# Patient Record
Sex: Male | Born: 1950
Health system: Southern US, Community
[De-identification: ages and names within clinical notes are randomized; demographics above are authoritative.]

## PROBLEM LIST (undated history)

## (undated) DIAGNOSIS — R600 Localized edema: Secondary | ICD-10-CM

## (undated) DIAGNOSIS — D649 Anemia, unspecified: Secondary | ICD-10-CM

## (undated) DIAGNOSIS — I10 Essential (primary) hypertension: Secondary | ICD-10-CM

## (undated) DIAGNOSIS — L309 Dermatitis, unspecified: Secondary | ICD-10-CM

## (undated) HISTORY — PX: COLONOSCOPY: SHX174

---

## 2004-10-31 ENCOUNTER — Emergency Department (HOSPITAL_COMMUNITY): Admission: EM | Admit: 2004-10-31 | Discharge: 2004-10-31 | Payer: Self-pay | Admitting: Family Medicine

## 2006-11-01 ENCOUNTER — Emergency Department (HOSPITAL_COMMUNITY): Admission: EM | Admit: 2006-11-01 | Discharge: 2006-11-02 | Payer: Self-pay | Admitting: Emergency Medicine

## 2010-02-24 ENCOUNTER — Emergency Department (HOSPITAL_COMMUNITY): Admission: EM | Admit: 2010-02-24 | Discharge: 2010-02-24 | Payer: Self-pay | Admitting: Emergency Medicine

## 2011-07-14 HISTORY — PX: FINGER SURGERY: SHX640

## 2012-05-18 ENCOUNTER — Emergency Department (HOSPITAL_COMMUNITY)
Admission: EM | Admit: 2012-05-18 | Discharge: 2012-05-19 | Disposition: A | Discharge status: Home / Self Care | Payer: Self-pay | Attending: Emergency Medicine | Admitting: Emergency Medicine

## 2012-05-18 ENCOUNTER — Encounter (HOSPITAL_COMMUNITY): Payer: Self-pay | Admitting: Adult Health

## 2012-05-18 DIAGNOSIS — L589 Radiodermatitis, unspecified: Secondary | ICD-10-CM | POA: Insufficient documentation

## 2012-05-18 DIAGNOSIS — Z7982 Long term (current) use of aspirin: Secondary | ICD-10-CM | POA: Insufficient documentation

## 2012-05-18 DIAGNOSIS — L309 Dermatitis, unspecified: Secondary | ICD-10-CM

## 2012-05-18 NOTE — ED Notes (Signed)
C/o facial rash that described as burning and itching t hat has been ongoing for 8 months and is intermittent. Small bumps noted to face. Airway intact.
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
## 2012-05-19 MED ORDER — HYDROCORTISONE 1 % EX CREA
TOPICAL_CREAM | Freq: Once | CUTANEOUS | Status: AC
  Administered 2012-05-19: 01:00:00 via TOPICAL
  Filled 2012-05-19: qty 28

## 2012-05-19 NOTE — ED Provider Notes (Signed)
Medical screening examination/treatment/procedure(s) were performed by non-physician practitioner and as supervising physician I was immediately available for consultation/collaboration.  Donnetta Hutching, MD 05/19/12 [phone]

## 2012-05-19 NOTE — ED Provider Notes (Signed)
History     CSN: [id number]  Arrival date & time 05/18/12  2144   None     Chief Complaint  Patient presents with  . Rash    (Consider location/radiation/quality/duration/timing/severity/associated sxs/prior treatment) HPI History provided by pt.   Pt c/o intermittent, severely pruritic and burning, diffuse facial rash x 9 months.  Symptoms have been very frequent lately.  Has had some relief w/ vitamin E lotion but not an anti-fungal cream recommended by his barber.  No associated sx including fever, dry mucous membranes or rash anywhere else.  No know allergies or new contacts.   History reviewed. No pertinent past medical history.  History reviewed. No pertinent past surgical history.  History reviewed. No pertinent family history.  History  Substance Use Topics  . Smoking status: Never Smoker   . Smokeless tobacco: Not on file  . Alcohol Use: No      Review of Systems  All other systems reviewed and are negative.    Allergies  Review of patient's allergies indicates no known allergies.  Home Medications   Current Outpatient Rx  Name  Route  Sig  Dispense  Refill  . ASPIRIN EC 81 MG PO TBEC   Oral   Take 81 mg by mouth daily.           BP 137/93  Pulse 114  Temp 98 F (36.7 C) (Oral)  Resp 20  SpO2 97%  Physical Exam  Nursing note and vitals reviewed. Constitutional: He is oriented to person, place, and time. He appears well-developed and well-nourished. No distress.  HENT:  Head: Normocephalic and atraumatic.  Mouth/Throat: Oropharynx is clear and moist.  Eyes:       Normal appearance  Neck: Normal range of motion.  Pulmonary/Chest: Effort normal.  Musculoskeletal: Normal range of motion.  Neurological: He is alert and oriented to person, place, and time.  Skin:       Hyperpigmented plaques of entire face. Skin feels mildly leathery.  No obvious edema.  Non-tender.  Rest of skin w/ nml appearance  Psychiatric: He has a normal mood and  affect. His behavior is normal.    ED Course  Procedures (including critical care time)  Labs Reviewed - No data to display No results found.   1. Chronic dermatitis       MDM  Healthy 61yo M presents w/ intermittent pruritic/burning facial rash x 9 months.  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  Will treat w/ hydrocortisone cream 1% BID-TID and recommended avoidance of scratching (benadryl and cool compresses to relieve itching).  Referred to [facility] for persistent/worsening sx.  Return precautions discussed.        Otilio Miu, [place] 05/19/12 [phone]

## 2012-09-13 ENCOUNTER — Emergency Department (HOSPITAL_COMMUNITY)
Admission: EM | Admit: 2012-09-13 | Discharge: 2012-09-13 | Disposition: A | Discharge status: Home / Self Care | Payer: Worker's Compensation | Attending: Orthopedic Surgery | Admitting: Orthopedic Surgery

## 2012-09-13 ENCOUNTER — Encounter (HOSPITAL_COMMUNITY): Payer: Self-pay | Admitting: *Deleted

## 2012-09-13 ENCOUNTER — Encounter (HOSPITAL_COMMUNITY): Payer: Self-pay | Admitting: Anesthesiology

## 2012-09-13 ENCOUNTER — Emergency Department (HOSPITAL_COMMUNITY): Payer: Worker's Compensation

## 2012-09-13 ENCOUNTER — Emergency Department (HOSPITAL_COMMUNITY): Payer: Worker's Compensation | Admitting: Anesthesiology

## 2012-09-13 ENCOUNTER — Encounter (HOSPITAL_COMMUNITY): Admission: EM | Disposition: A | Discharge status: Home / Self Care | Payer: Self-pay | Source: Home / Self Care

## 2012-09-13 ENCOUNTER — Ambulatory Visit: Admit: 2012-09-13 | Payer: Self-pay | Admitting: Orthopedic Surgery

## 2012-09-13 DIAGNOSIS — S6980XA Other specified injuries of unspecified wrist, hand and finger(s), initial encounter: Secondary | ICD-10-CM | POA: Insufficient documentation

## 2012-09-13 DIAGNOSIS — S6721XA Crushing injury of right hand, initial encounter: Secondary | ICD-10-CM

## 2012-09-13 DIAGNOSIS — S6990XA Unspecified injury of unspecified wrist, hand and finger(s), initial encounter: Secondary | ICD-10-CM | POA: Insufficient documentation

## 2012-09-13 DIAGNOSIS — Y9269 Other specified industrial and construction area as the place of occurrence of the external cause: Secondary | ICD-10-CM | POA: Insufficient documentation

## 2012-09-13 DIAGNOSIS — W319XXA Contact with unspecified machinery, initial encounter: Secondary | ICD-10-CM | POA: Insufficient documentation

## 2012-09-13 DIAGNOSIS — Y990 Civilian activity done for income or pay: Secondary | ICD-10-CM | POA: Insufficient documentation

## 2012-09-13 DIAGNOSIS — S62639B Displaced fracture of distal phalanx of unspecified finger, initial encounter for open fracture: Secondary | ICD-10-CM | POA: Insufficient documentation

## 2012-09-13 HISTORY — PX: I & D EXTREMITY: SHX5045

## 2012-09-13 LAB — CBC WITH DIFFERENTIAL/PLATELET
Basophils Absolute: 0 10*3/uL (ref 0.0–0.1)
Basophils Relative: 1 % (ref 0–1)
Eosinophils Absolute: 0.3 10*3/uL (ref 0.0–0.7)
Eosinophils Relative: 5 % (ref 0–5)
HCT: 38.8 % — ABNORMAL LOW (ref 39.0–52.0)
Hemoglobin: 13.4 g/dL (ref 13.0–17.0)
Lymphocytes Relative: 27 % (ref 12–46)
Lymphs Abs: 1.7 10*3/uL (ref 0.7–4.0)
MCH: 30 pg (ref 26.0–34.0)
MCHC: 34.5 g/dL (ref 30.0–36.0)
MCV: 87 fL (ref 78.0–100.0)
Monocytes Absolute: 0.5 10*3/uL (ref 0.1–1.0)
Monocytes Relative: 8 % (ref 3–12)
Neutro Abs: 3.9 10*3/uL (ref 1.7–7.7)
Neutrophils Relative %: 61 % (ref 43–77)
Platelets: 255 10*3/uL (ref 150–400)
RBC: 4.46 MIL/uL (ref 4.22–5.81)
RDW: 12.9 % (ref 11.5–15.5)
WBC: 6.5 10*3/uL (ref 4.0–10.5)

## 2012-09-13 LAB — COMPREHENSIVE METABOLIC PANEL
ALT: 30 U/L (ref 0–53)
AST: 46 U/L — ABNORMAL HIGH (ref 0–37)
Albumin: 3.7 g/dL (ref 3.5–5.2)
Alkaline Phosphatase: 100 U/L (ref 39–117)
BUN: 12 mg/dL (ref 6–23)
CO2: 24 mEq/L (ref 19–32)
Calcium: 9.7 mg/dL (ref 8.4–10.5)
Chloride: 103 mEq/L (ref 96–112)
Creatinine, Ser: 0.9 mg/dL (ref 0.50–1.35)
GFR calc Af Amer: >90 mL/min (ref >90)
GFR calc non Af Amer: >90 mL/min (ref >90)
Glucose, Bld: 108 mg/dL — ABNORMAL HIGH (ref 70–99)
Potassium: 3.8 mEq/L (ref 3.5–5.1)
Sodium: 139 mEq/L (ref 135–145)
Total Bilirubin: 0.6 mg/dL (ref 0.3–1.2)
Total Protein: 8.1 g/dL (ref 6.0–8.3)

## 2012-09-13 SURGERY — IRRIGATION AND DEBRIDEMENT EXTREMITY
Anesthesia: General | Site: Hand | Laterality: Right | Wound class: Clean

## 2012-09-13 MED ORDER — ONDANSETRON HCL 4 MG/2ML IJ SOLN
INTRAMUSCULAR | Status: DC | PRN
  Administered 2012-09-13: 4 mg via INTRAVENOUS

## 2012-09-13 MED ORDER — GLYCOPYRROLATE 0.2 MG/ML IJ SOLN
INTRAMUSCULAR | Status: DC | PRN
  Administered 2012-09-13: 0.2 mg via INTRAVENOUS

## 2012-09-13 MED ORDER — ONDANSETRON HCL 4 MG/2ML IJ SOLN
4.0000 mg | Freq: Four times a day (QID) | INTRAMUSCULAR | Status: DC | PRN
  Administered 2012-09-13: 4 mg via INTRAVENOUS
  Filled 2012-09-13: qty 2

## 2012-09-13 MED ORDER — CEFAZOLIN SODIUM-DEXTROSE 2-3 GM-% IV SOLR
2.0000 g | Freq: Once | INTRAVENOUS | Status: AC
  Administered 2012-09-13: 2 g via INTRAVENOUS
  Filled 2012-09-13: qty 50

## 2012-09-13 MED ORDER — DEXAMETHASONE SODIUM PHOSPHATE 4 MG/ML IJ SOLN
INTRAMUSCULAR | Status: DC | PRN
  Administered 2012-09-13: 4 mg via INTRAVENOUS

## 2012-09-13 MED ORDER — SODIUM CHLORIDE 0.9 % IR SOLN
Status: DC | PRN
  Administered 2012-09-13: 2000 mL

## 2012-09-13 MED ORDER — OXYCODONE HCL 5 MG PO TABS: 5.0000 mg | ORAL_TABLET | ORAL | Status: DC | PRN

## 2012-09-13 MED ORDER — LIDOCAINE HCL (CARDIAC) 20 MG/ML IV SOLN
INTRAVENOUS | Status: DC | PRN
  Administered 2012-09-13: 100 mg via INTRAVENOUS

## 2012-09-13 MED ORDER — LACTATED RINGERS IV SOLN
INTRAVENOUS | Status: DC | PRN
  Administered 2012-09-13: 21:00:00 via INTRAVENOUS

## 2012-09-13 MED ORDER — MIDAZOLAM HCL 5 MG/5ML IJ SOLN
INTRAMUSCULAR | Status: DC | PRN
  Administered 2012-09-13: 2 mg via INTRAVENOUS

## 2012-09-13 MED ORDER — CEPHALEXIN 500 MG PO CAPS: 500.0000 mg | ORAL_CAPSULE | Freq: Four times a day (QID) | ORAL | Status: DC

## 2012-09-13 MED ORDER — SODIUM CHLORIDE 0.9 % IV SOLN
INTRAVENOUS | Status: DC | PRN
  Administered 2012-09-13: 20:00:00 via INTRAVENOUS

## 2012-09-13 MED ORDER — SUFENTANIL CITRATE 50 MCG/ML IV SOLN
INTRAVENOUS | Status: DC | PRN
  Administered 2012-09-13 (×2): 10 ug via INTRAVENOUS

## 2012-09-13 MED ORDER — PROPOFOL 10 MG/ML IV BOLUS
INTRAVENOUS | Status: DC | PRN
  Administered 2012-09-13: 50 mg via INTRAVENOUS
  Administered 2012-09-13: 200 mg via INTRAVENOUS

## 2012-09-13 MED ORDER — SUCCINYLCHOLINE CHLORIDE 20 MG/ML IJ SOLN
INTRAMUSCULAR | Status: DC | PRN
  Administered 2012-09-13: 120 mg via INTRAVENOUS

## 2012-09-13 MED ORDER — BUPIVACAINE HCL (PF) 0.25 % IJ SOLN
INTRAMUSCULAR | Status: DC | PRN
  Administered 2012-09-13: 9 mL

## 2012-09-13 MED ORDER — LIDOCAINE HCL 4 % MT SOLN
OROMUCOSAL | Status: DC | PRN
  Administered 2012-09-13: 4 mL via TOPICAL

## 2012-09-13 MED ORDER — MORPHINE SULFATE 2 MG/ML IJ SOLN
2.0000 mg | INTRAMUSCULAR | Status: DC | PRN
  Administered 2012-09-13: 2 mg via INTRAVENOUS
  Filled 2012-09-13: qty 1

## 2012-09-13 MED ORDER — SODIUM CHLORIDE 0.9 % IV SOLN
Freq: Once | INTRAVENOUS | Status: AC
  Administered 2012-09-13: 17:00:00 via INTRAVENOUS

## 2012-09-13 SURGICAL SUPPLY — 52 items
BALL CTTN LRG ABS STRL LF (GAUZE/BANDAGES/DRESSINGS) ×1
BANDAGE CONFORM 2  STR LF (GAUZE/BANDAGES/DRESSINGS) IMPLANT
BANDAGE ELASTIC 3 VELCRO ST LF (GAUZE/BANDAGES/DRESSINGS) ×1 IMPLANT
BANDAGE ELASTIC 4 VELCRO ST LF (GAUZE/BANDAGES/DRESSINGS) ×2 IMPLANT
BANDAGE GAUZE 4  KLING STR (GAUZE/BANDAGES/DRESSINGS) ×2 IMPLANT
BANDAGE GAUZE ELAST BULKY 4 IN (GAUZE/BANDAGES/DRESSINGS) ×1 IMPLANT
CLOTH BEACON ORANGE TIMEOUT ST (SAFETY) ×2 IMPLANT
CORDS BIPOLAR (ELECTRODE) ×2 IMPLANT
COTTONBALL LRG STERILE PKG (GAUZE/BANDAGES/DRESSINGS) ×1 IMPLANT
CUFF TOURNIQUET SINGLE 18IN (TOURNIQUET CUFF) ×2 IMPLANT
CUFF TOURNIQUET SINGLE 24IN (TOURNIQUET CUFF) IMPLANT
CUFF TOURNIQUET SINGLE 34IN LL (TOURNIQUET CUFF) IMPLANT
CUFF TOURNIQUET SINGLE 44IN (TOURNIQUET CUFF) IMPLANT
DRSG ADAPTIC 3X8 NADH LF (GAUZE/BANDAGES/DRESSINGS) ×1 IMPLANT
ELECT REM PT RETURN 9FT ADLT (ELECTROSURGICAL) ×2
ELECTRODE REM PT RTRN 9FT ADLT (ELECTROSURGICAL) IMPLANT
GAUZE XEROFORM 1X8 LF (GAUZE/BANDAGES/DRESSINGS) ×1 IMPLANT
GAUZE XEROFORM 5X9 LF (GAUZE/BANDAGES/DRESSINGS) ×1 IMPLANT
GLOVE BIOGEL M STRL SZ7.5 (GLOVE) ×4 IMPLANT
GLOVE SS BIOGEL STRL SZ 8 (GLOVE) ×1 IMPLANT
GLOVE SUPERSENSE BIOGEL SZ 8 (GLOVE) ×3
GOWN PREVENTION PLUS XLARGE (GOWN DISPOSABLE) ×1 IMPLANT
GOWN STRL NON-REIN LRG LVL3 (GOWN DISPOSABLE) ×4 IMPLANT
GOWN STRL REIN XL XLG (GOWN DISPOSABLE) ×3 IMPLANT
HANDPIECE INTERPULSE COAX TIP (DISPOSABLE) ×2
KIT BASIN OR (CUSTOM PROCEDURE TRAY) ×2 IMPLANT
KIT ROOM TURNOVER OR (KITS) ×2 IMPLANT
MANIFOLD NEPTUNE II (INSTRUMENTS) ×1 IMPLANT
NDL HYPO 25GX1X1/2 BEV (NEEDLE) IMPLANT
NEEDLE HYPO 25GX1X1/2 BEV (NEEDLE) ×2 IMPLANT
NS IRRIG 1000ML POUR BTL (IV SOLUTION) ×3 IMPLANT
PACK ORTHO EXTREMITY (CUSTOM PROCEDURE TRAY) ×2 IMPLANT
PAD ARMBOARD 7.5X6 YLW CONV (MISCELLANEOUS) ×4 IMPLANT
PAD CAST 4YDX4 CTTN HI CHSV (CAST SUPPLIES) ×1 IMPLANT
PADDING CAST ABS 4INX4YD NS (CAST SUPPLIES) ×1
PADDING CAST ABS COTTON 4X4 ST (CAST SUPPLIES) IMPLANT
PADDING CAST COTTON 4X4 STRL (CAST SUPPLIES)
SET HNDPC FAN SPRY TIP SCT (DISPOSABLE) IMPLANT
SPLINT FIBERGLASS 4X15 (CAST SUPPLIES) ×1 IMPLANT
SPONGE GAUZE 4X4 12PLY (GAUZE/BANDAGES/DRESSINGS) ×1 IMPLANT
SPONGE LAP 18X18 X RAY DECT (DISPOSABLE) ×1 IMPLANT
SPONGE LAP 4X18 X RAY DECT (DISPOSABLE) ×2 IMPLANT
SUT CHROMIC 5 0 P 3 (SUTURE) ×4 IMPLANT
SUT PROLENE 3 0 PS 2 (SUTURE) ×1 IMPLANT
SUT PROLENE 4 0 PS 2 18 (SUTURE) ×5 IMPLANT
SYR CONTROL 10ML LL (SYRINGE) ×1 IMPLANT
TOWEL OR 17X24 6PK STRL BLUE (TOWEL DISPOSABLE) ×2 IMPLANT
TOWEL OR 17X26 10 PK STRL BLUE (TOWEL DISPOSABLE) ×2 IMPLANT
TUBE ANAEROBIC SPECIMEN COL (MISCELLANEOUS) IMPLANT
TUBE CONNECTING 12X1/4 (SUCTIONS) ×1 IMPLANT
WATER STERILE IRR 1000ML POUR (IV SOLUTION) ×1 IMPLANT
YANKAUER SUCT BULB TIP NO VENT (SUCTIONS) ×2 IMPLANT
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
## 2012-09-13 NOTE — Anesthesia Postprocedure Evaluation (Signed)
  Anesthesia Post-op Note  Patient: Raymond Stone  Procedure(s) Performed: Procedure(s): IRRIGATION AND DEBRIDEMENT EXTREMITY  RIGHT MIDDLE FINGER WITH REVISION AMPUTATION AND SKIN GRAFTING. (Right)  Patient Location: PACU  Anesthesia Type:General  Level of Consciousness: awake  Airway and Oxygen Therapy: Patient Spontanous Breathing  Post-op Pain: mild  Post-op Assessment: Post-op Vital signs reviewed, Patient's Cardiovascular Status Stable, Respiratory Function Stable, Patent Airway, No signs of Nausea or vomiting and Pain level controlled  Post-op Vital Signs: stable  Complications: No apparent anesthesia complications

## 2012-09-13 NOTE — ED Notes (Signed)
Pt has right middle finger injury from a roller and sent here to see Dr. Amanda Pea.

## 2012-09-13 NOTE — Progress Notes (Signed)
Orthopedic Tech Progress Note Patient Details:  Raymond Stone [birth year] [id number]  Ortho Devices Type of Ortho Device: Arm sling Ortho Device/Splint Location: (L) UE Ortho Device/Splint Interventions: Application;Ordered   [name] 09/13/2012, 10:41 PM

## 2012-09-13 NOTE — Anesthesia Preprocedure Evaluation (Addendum)
Anesthesia Evaluation  Patient identified by MRN, date of birth, ID band Patient awake    Reviewed: Allergy & Precautions, H&P , NPO status , Patient's Chart, lab work & pertinent test results  Airway Mallampati: III TM Distance: >3 FB Neck ROM: Full    Dental  (+) Teeth Intact and Partial Lower   Pulmonary  breath sounds clear to auscultation        Cardiovascular Rhythm:Regular Rate:Normal     Neuro/Psych    GI/Hepatic   Endo/Other    Renal/GU      Musculoskeletal   Abdominal   Peds  Hematology   Anesthesia Other Findings   Reproductive/Obstetrics                          Anesthesia Physical Anesthesia Plan  ASA: I and emergent  Anesthesia Plan: General   Post-op Pain Management:    Induction: Intravenous  Airway Management Planned: LMA  Additional Equipment:   Intra-op Plan:   Post-operative Plan: Extubation in OR  Informed Consent: I have reviewed the patients History and Physical, chart, labs and discussed the procedure including the risks, benefits and alternatives for the proposed anesthesia with the patient or authorized representative who has indicated his/her understanding and acceptance.     Plan Discussed with: CRNA and Surgeon  Anesthesia Plan Comments:         Anesthesia Quick Evaluation

## 2012-09-13 NOTE — ED Notes (Signed)
Dr Amanda Pea notified of pt in ED; to see pt for possible sx

## 2012-09-13 NOTE — Transfer of Care (Signed)
Immediate Anesthesia Transfer of Care Note  Patient: Raymond Stone  Procedure(s) Performed: Procedure(s): IRRIGATION AND DEBRIDEMENT EXTREMITY  RIGHT MIDDLE FINGER WITH REVISION AMPUTATION AND SKIN GRAFTING. (Right)  Patient Location: PACU  Anesthesia Type:General  Level of Consciousness: oriented, sedated, patient cooperative and responds to stimulation  Airway & Oxygen Therapy: Patient Spontanous Breathing and Patient connected to nasal cannula oxygen  Post-op Assessment: Report given to PACU RN, Post -op Vital signs reviewed and stable and Patient moving all extremities X 4  Post vital signs: Reviewed and stable  Complications: No apparent anesthesia complications

## 2012-09-13 NOTE — Op Note (Signed)
See dictation #[id number] Dominica Severin MD

## 2012-09-14 ENCOUNTER — Encounter (HOSPITAL_COMMUNITY): Payer: Self-pay | Admitting: Orthopedic Surgery

## 2012-09-14 NOTE — Op Note (Signed)
NAME:  Raymond Stone, Raymond Stone NO.:  [id number]  MEDICAL RECORD NO.:  [id number]  LOCATION:  [facility]                         FACILITY:  [facility]  PHYSICIAN:  Dionne Ano. Gramig, M.D.DATE OF BIRTH:  08-01-1950  DATE OF PROCEDURE: DATE OF DISCHARGE:  09/13/2012                              OPERATIVE REPORT   PREOPERATIVE DIAGNOSIS:  Right middle finger crush injury by 2 rowers with open fracture and a severe loss of the skin architecture with nail bed injury.  POSTOPERATIVE DIAGNOSIS:  Right middle finger crush injury by 2 rowers open fracture and a severe loss of the skin architecture with nail bed injury.  PROCEDURE: 1. Irrigation and debridement, open fracture, skin and subcutaneous     tissue, and bone.  This was an excisional debridement with scissor     and knife blade, as well as curette. 2. Open treatment distal phalanx fracture, right middle finger. 3. A 2.5 x 2 cm full-thickness skin graft to the middle finger (donor     site, right forearm). 4. Nail bed repair, right middle finger.  SURGEON:  Dionne Ano. Amanda Pea, M.D.  ASSISTANT:  Karie Chimera, P.A.-C.  COMPLICATION:  None.  ANESTHESIA:  General.  TOURNIQUET TIME:  Less than 30 minutes.  INDICATIONS:  The patient is a pleasant male, who presents with the above-mentioned diagnosis.  I have counseled him in regard to risks and benefits of surgery, and he desires to proceed the above-mentioned operative intervention.  He understands the risks, benefits, do's and don'ts and with all questions encouraged and hence he desires to proceed.  DESCRIPTION OF PROCEDURE:  The patient was seen by myself and anesthesia.  He was taken to the operative suite, underwent smooth induction of general anesthesia.  Arm was marked.  Postop check list was completed.  Time-out was called.  Following this, he was prepped and draped in usual sterile fashion.  Betadine scrub and paint about the right upper extremity.  Once this was  done, the patient underwent I and D of skin and subcutaneous tissue, tendon, and bone.  This was an excisional debridement with curette, knife, blade, and scissor.  Following this irrigation and debridement with copious amounts of saline were placed in the wound.  The patient then underwent evaluation of the nail bed.  The nail bed had some disarray distally, but the bone was covered.  The bone was also covered about the mid portion and there was no exposed flexor tendon surfaces.  We discussed thenar versus cross- finger versus full-thickness skin graft.  I felt that a full-thickness skin graft should be judiciously attempted given the parameters of subcu integrity.  At this time, I harvested a 2.5 x 2 cm skin graft in the forearm.  This was then closed primarily with combination of Prolene sutures.  Following this, the skin graft was defatted and one lone pie crust region was placed centrally and it was placed in the defect, open treatment of the distal phalanx fracture was accomplished in due course.  Following this, combination of Prolene and chromic sutures were used to place a skin graft in excellent position.  The patient tolerated this quite well.  There were no complicating features.  Following this, the patient then underwent a very careful and cautious placement of a chromic sutures over the nail bed to perform nail bed repair in conjunction with the skin graft.  The patient had good coverage, no complicating features.  There were some small exposed areas, it should filling nicely with skin, but overall the skin graft tags.  I think this would be a very nice solution form.  The skin grafting nail bed repair, irrigation and debridement, and treatment of an open fracture was accomplished.  There were no complicating features.  He was dressed sterilely without difficulty.  A 10 mL Sensorcaine was placed on the wound for postop analgesia on both form, none were placed in the  palm as he had previously had a block placed in the ER.  A sterile dressing was applied.  He was taken to recovery room.  He will be given additional 1 g of Ancef.  Discharged home on Keflex 500 q.i.d. as well as oxycodone for pain.  See [place] back in the office in 7-10 days for followup with therapy appointment immediately following.  These notes have been discussed, I would recommend work until he sees [place] back in the office.     Dionne Ano. Amanda Pea, M.D.     [facility]  D:  09/13/2012  T:  09/14/2012  Job:  [id number]

## 2013-04-20 ENCOUNTER — Emergency Department (HOSPITAL_COMMUNITY)
Admission: EM | Admit: 2013-04-20 | Discharge: 2013-04-20 | Disposition: A | Discharge status: Home / Self Care | Payer: No Typology Code available for payment source | Attending: Emergency Medicine | Admitting: Emergency Medicine

## 2013-04-20 ENCOUNTER — Emergency Department (HOSPITAL_COMMUNITY): Payer: No Typology Code available for payment source

## 2013-04-20 ENCOUNTER — Encounter (HOSPITAL_COMMUNITY): Payer: Self-pay | Admitting: Emergency Medicine

## 2013-04-20 DIAGNOSIS — Z792 Long term (current) use of antibiotics: Secondary | ICD-10-CM | POA: Insufficient documentation

## 2013-04-20 DIAGNOSIS — H538 Other visual disturbances: Secondary | ICD-10-CM | POA: Insufficient documentation

## 2013-04-20 LAB — CBC WITH DIFFERENTIAL/PLATELET
Basophils Relative: 0 % (ref 0–1)
Eosinophils Absolute: 0.2 10*3/uL (ref 0.0–0.7)
Eosinophils Relative: 2 % (ref 0–5)
HCT: 39.7 % (ref 39.0–52.0)
Hemoglobin: 14.1 g/dL (ref 13.0–17.0)
MCH: 30.7 pg (ref 26.0–34.0)
MCHC: 35.5 g/dL (ref 30.0–36.0)
MCV: 86.3 fL (ref 78.0–100.0)
Monocytes Relative: 9 % (ref 3–12)
Neutrophils Relative %: 56 % (ref 43–77)
Platelets: 295 10*3/uL (ref 150–400)
WBC: 6.7 10*3/uL (ref 4.0–10.5)

## 2013-04-20 LAB — BASIC METABOLIC PANEL
BUN: 14 mg/dL (ref 6–23)
Calcium: 9.5 mg/dL (ref 8.4–10.5)
GFR calc Af Amer: >90 mL/min (ref >90)
GFR calc non Af Amer: 90 mL/min — ABNORMAL LOW (ref >90)
Glucose, Bld: 99 mg/dL (ref 70–99)
Potassium: 4.4 mEq/L (ref 3.5–5.1)
Sodium: 137 mEq/L (ref 135–145)

## 2013-04-20 MED ORDER — TETRACAINE HCL 0.5 % OP SOLN
1.0000 [drp] | Freq: Once | OPHTHALMIC | Status: AC
  Administered 2013-04-20: 1 [drp] via OPHTHALMIC
  Filled 2013-04-20: qty 2

## 2013-04-20 NOTE — ED Provider Notes (Signed)
CSN: [id number]     Arrival date & time 04/20/13  1042 History   First MD Initiated Contact with Patient 04/20/13 1113     Chief Complaint  Patient presents with  . Eye Problem   (Consider location/radiation/quality/duration/timing/severity/associated sxs/prior Treatment) Patient is a 62 y.o. male presenting with eye problem.  Eye Problem Location:  L eye Quality: blurry vision. Severity:  Moderate Onset quality:  Gradual Duration:  2 days Timing:  Constant Progression:  Unchanged Chronicity:  New Context: not contact lens problem, not direct trauma and not scratch   Relieved by:  Nothing Worsened by:  Nothing tried Associated symptoms: blurred vision   Associated symptoms: no double vision, no headaches, no nausea, no photophobia and no vomiting     History reviewed. No pertinent past medical history. Past Surgical History  Procedure Laterality Date  . I&d extremity Right 09/13/2012    Procedure: IRRIGATION AND DEBRIDEMENT EXTREMITY  RIGHT MIDDLE FINGER WITH REVISION AMPUTATION AND SKIN GRAFTING.;  Surgeon: Dominica Severin, MD;  Location: [facility] OR;  Service: Orthopedics;  Laterality: Right;   History reviewed. No pertinent family history. History  Substance Use Topics  . Smoking status: Never Smoker   . Smokeless tobacco: Not on file  . Alcohol Use: Yes     Comment: beer everyday    Review of Systems  Constitutional: Negative for fever.  HENT: Negative for congestion.   Eyes: Positive for blurred vision. Negative for double vision and photophobia.  Respiratory: Negative for cough and shortness of breath.   Cardiovascular: Negative for chest pain.  Gastrointestinal: Negative for nausea, vomiting, abdominal pain and diarrhea.  Neurological: Negative for headaches.  All other systems reviewed and are negative.    Allergies  Review of patient's allergies indicates no known allergies.  Home Medications   Current Outpatient Rx  Name  Route  Sig  Dispense  Refill  .  traMADol (ULTRAM) 50 MG tablet   Oral   Take 50 mg by mouth every 6 (six) hours as needed for pain.         . cephALEXin (KEFLEX) 500 MG capsule   Oral   Take 1 capsule (500 mg total) by mouth 4 (four) times daily.   40 capsule   0   . oxyCODONE (ROXICODONE) 5 MG immediate release tablet   Oral   Take 1 tablet (5 mg total) by mouth every 4 (four) hours as needed for pain.   30 tablet   0    BP 137/80  Pulse 105  Temp(Src) 97.8 F (36.6 C) (Oral)  Resp 22  Ht 5\' 10"  (1.778 m)  Wt 212 lb 14.4 oz (96.571 kg)  BMI 30.55 kg/m2  SpO2 94% Physical Exam  Nursing note and vitals reviewed. Constitutional: He is oriented to person, place, and time. He appears well-developed and well-nourished. No distress.  HENT:  Head: Normocephalic and atraumatic.  Mouth/Throat: Oropharynx is clear and moist.  Eyes: Conjunctivae and EOM are normal. Pupils are equal, round, and reactive to light. Right eye exhibits no chemosis, no discharge, no exudate and no hordeolum. Left eye exhibits no chemosis, no discharge, no exudate and no hordeolum. Right conjunctiva is not injected. Left conjunctiva is not injected. No scleral icterus.  Vision decreased but present in left eye.   Visual fields worst in left eye lateral upper field.  Intact in all other fields.  No proptosis.  IOP: Left eye 17,16,15 Right eye 17, 18, 4  Neck: Neck supple.  Cardiovascular: Normal rate, regular rhythm,  normal heart sounds and intact distal pulses.   No murmur heard. Pulmonary/Chest: Effort normal and breath sounds normal. No stridor. No respiratory distress. He has no wheezes. He has no rales.  Abdominal: Soft. He exhibits no distension. There is no tenderness.  Musculoskeletal: Normal range of motion. He exhibits no edema.  Neurological: He is alert and oriented to person, place, and time.  Skin: Skin is warm and dry. No rash noted.  Psychiatric: He has a normal mood and affect. His behavior is normal.    ED Course   Procedures (including critical care time) Labs Review Labs Reviewed  BASIC METABOLIC PANEL - Abnormal; Notable for the following:    GFR calc non Af Amer 90 (*)    All other components within normal limits  CBC WITH DIFFERENTIAL   Imaging Review Ct Head Wo Contrast  04/20/2013   CLINICAL DATA:  62 year old male with blurred vision.  EXAM: CT HEAD WITHOUT CONTRAST  TECHNIQUE: Contiguous axial images were obtained from the base of the skull through the vertex without intravenous contrast.  COMPARISON:  None.  FINDINGS: Visible right maxillary sinuses airless, with mucoperiosteal thickening, and medial bowing into the right nasal cavity. There is mild mucosal thickening in the left frontal recess. But otherwise, the remaining paranasal sinuses are clear.  No acute osseous abnormality identified. Visualized orbit soft tissues are within normal limits. Visualized scalp soft tissues are within normal limits.  Cerebral volume is within normal limits for age. No midline shift, ventriculomegaly, mass effect, evidence of mass lesion, intracranial hemorrhage or evidence of cortically based acute infarction. Gray-white matter differentiation is within normal limits throughout the brain. No suspicious intracranial vascular hyperdensity.  IMPRESSION: 1. Normal for age non contrast CT appearance of the brain.  2.  Chronic right maxillary sinusitis/mucocele.   Electronically Signed   By: Augusto Gamble M.D.   On: 04/20/2013 13:56   Above radiology studies independently viewed by me.     EKG Interpretation   None     EKG - sinus tachy, rate 103, normal axis, normal intervals, no ST/T changes, similar to prior.  MDM   1. Blurry vision, left eye    62 yo male with two days of blurry vision in left eye.  Able to see shapes out of it, but unable to make out details.  Left lateral upper visual field seems to be most affected.  Neuro exam otherwise without deficits.  CT negative.  Discussed case with Dr. Cyril Mourning  (neurology) who recommended MRI.  This test pending at time care transferred to Dr. Silverio Lay.      Candyce Churn, MD 04/21/13 [phone]

## 2013-04-20 NOTE — ED Provider Notes (Signed)
Care assumed at sign out. Sign out pending MRI for L eye decreased vision. MRI showed no stroke. Stable for d/c. Will have him see ophtho. Nl eye pressure and denies eye pain so I doubt acute angel closure glaucoma.   Richardean Canal, MD 04/20/13 [phone]

## 2013-04-20 NOTE — ED Notes (Signed)
Pt discharged.Vital signs stable and GCS 15 

## 2013-04-20 NOTE — ED Notes (Signed)
Pt arrive to ed c/o blurry vision in left eye without pain.  Pt denies recent illness/injury.  No obvious injury or deformity.  Perrla.  Caox4,pmsx4, nad.

## 2013-04-20 NOTE — ED Notes (Signed)
Checked patient eyes left eye patient could not see anything, 20/70 in right eye, and 20/50 both eyes

## 2013-04-20 NOTE — ED Notes (Signed)
Patient transported to CT 

## 2013-12-06 ENCOUNTER — Emergency Department (HOSPITAL_COMMUNITY)
Admission: EM | Admit: 2013-12-06 | Discharge: 2013-12-06 | Disposition: A | Discharge status: Home / Self Care | Payer: No Typology Code available for payment source | Attending: Emergency Medicine | Admitting: Emergency Medicine

## 2013-12-06 ENCOUNTER — Encounter (HOSPITAL_COMMUNITY): Payer: Self-pay | Admitting: Emergency Medicine

## 2013-12-06 DIAGNOSIS — L259 Unspecified contact dermatitis, unspecified cause: Secondary | ICD-10-CM | POA: Insufficient documentation

## 2013-12-06 DIAGNOSIS — L309 Dermatitis, unspecified: Secondary | ICD-10-CM

## 2013-12-06 MED ORDER — HYDROXYZINE PAMOATE 100 MG PO CAPS: 100.0000 mg | ORAL_CAPSULE | Freq: Three times a day (TID) | ORAL | Status: DC | PRN

## 2013-12-06 MED ORDER — METHYLPREDNISOLONE 4 MG PO TABS: 4.0000 mg | ORAL_TABLET | Freq: Every day | ORAL | Status: DC

## 2013-12-06 MED ORDER — TRIAMCINOLONE ACETONIDE 0.1 % EX CREA: 1.0000 "application " | TOPICAL_CREAM | Freq: Two times a day (BID) | CUTANEOUS | Status: DC

## 2013-12-06 NOTE — ED Notes (Signed)
Pt reports gradual onset of rash for 1 year on arms, torso and head. Areas appear red and is itching and painful. Recently rash has become worse. resp e/u. Skin warm and dry.

## 2013-12-06 NOTE — Discharge Instructions (Signed)
Eczema Eczema, also called atopic dermatitis, is a skin disorder that causes inflammation of the skin. It causes a red rash and dry, scaly skin. The skin becomes very itchy. Eczema is generally worse during the cooler winter months and often improves with the warmth of summer. Eczema usually starts showing signs in infancy. Some children outgrow eczema, but it may last through adulthood.  CAUSES  The exact cause of eczema is not known, but it appears to run in families. People with eczema often have a family history of eczema, allergies, asthma, or hay fever. Eczema is not contagious. Flare-ups of the condition may be caused by:   Contact with something you are sensitive or allergic to.   Stress. SIGNS AND SYMPTOMS  Dry, scaly skin.   Red, itchy rash.   Itchiness. This may occur before the skin rash and may be very intense.  DIAGNOSIS  The diagnosis of eczema is usually made based on symptoms and medical history. TREATMENT  Eczema cannot be cured, but symptoms usually can be controlled with treatment and other strategies. A treatment plan might include:  Controlling the itching and scratching.   Use over-the-counter antihistamines as directed for itching. This is especially useful at night when the itching tends to be worse.   Use over-the-counter steroid creams as directed for itching.   Avoid scratching. Scratching makes the rash and itching worse. It may also result in a skin infection (impetigo) due to a break in the skin caused by scratching.   Keeping the skin well moisturized with creams every day. This will seal in moisture and help prevent dryness. Lotions that contain alcohol and water should be avoided because they can dry the skin.   Limiting exposure to things that you are sensitive or allergic to (allergens).   Recognizing situations that cause stress.   Developing a plan to manage stress.  HOME CARE INSTRUCTIONS   Only take over-the-counter or  prescription medicines as directed by your health care provider.   Do not use anything on the skin without checking with your health care provider.   Keep baths or showers short (5 minutes) in warm (not hot) water. Use mild cleansers for bathing. These should be unscented. You may add nonperfumed bath oil to the bath water. It is best to avoid soap and bubble bath.   Immediately after a bath or shower, when the skin is still damp, apply a moisturizing ointment to the entire body. This ointment should be a petroleum ointment. This will seal in moisture and help prevent dryness. The thicker the ointment, the better. These should be unscented.   Keep fingernails cut short. Children with eczema may need to wear soft gloves or mittens at night after applying an ointment.   Dress in clothes made of cotton or cotton blends. Dress lightly, because heat increases itching.   A child with eczema should stay away from anyone with fever blisters or cold sores. The virus that causes fever blisters (herpes simplex) can cause a serious skin infection in children with eczema. SEEK MEDICAL CARE IF:   Your itching interferes with sleep.   Your rash gets worse or is not better within 1 week after starting treatment.   You see pus or soft yellow scabs in the rash area.   You have a fever.   You have a rash flare-up after contact with someone who has fever blisters.  Document Released: 06/26/2000 Document Revised: 04/19/2013 Document Reviewed: 01/30/2013 [facility] Patient Information 2014 [place].

## 2013-12-06 NOTE — ED Notes (Signed)
MD at bedside. 

## 2013-12-06 NOTE — ED Provider Notes (Signed)
CSN: [id number]     Arrival date & time 12/06/13  0012 History   First MD Initiated Contact with Patient 12/06/13 0601     Chief Complaint  Patient presents with  . Rash   HPI Comments: Patient presents to the [facility] ED for 3 days of severe itching and rash.  Patient states that he has struggled with severe eczema for the past several years, but over the last three days he has had uncontrollable itching which is preventing him from sleeping.  Patient has tried hydrocortisone and eucerin cream with little relief.  He does have a dermatologist which he last saw in January.  Patient is trying to become a part of a [facility] study.  He denies any fever, chills, nausea, vomiting, or open wounds.    Patient is a 63 y.o. male presenting with rash. The history is provided by the patient. No language interpreter was used.  Rash Associated symptoms: no fatigue and no fever     History reviewed. No pertinent past medical history. Past Surgical History  Procedure Laterality Date  . I&d extremity Right 09/13/2012    Procedure: IRRIGATION AND DEBRIDEMENT EXTREMITY  RIGHT MIDDLE FINGER WITH REVISION AMPUTATION AND SKIN GRAFTING.;  Surgeon: Roseanne Kaufman, MD;  Location: [place];  Service: Orthopedics;  Laterality: Right;   No family history on file. History  Substance Use Topics  . Smoking status: Never Smoker   . Smokeless tobacco: Not on file  . Alcohol Use: Yes     Comment: beer everyday    Review of Systems  Constitutional: Negative for fever, chills and fatigue.  Skin: Positive for rash. Negative for color change and wound.  All other systems reviewed and are negative.     Allergies  Review of patient's allergies indicates no known allergies.  Home Medications   Prior to Admission medications   Medication Sig Start Date End Date Taking? Authorizing Provider  Tetrahydrozoline HCl (VISINE OP) Place 1-2 drops into both eyes 2 (two) times daily as needed (redness).   Yes Historical  Provider, MD   BP 115/71  Pulse 80  Temp(Src) 98.6 F (37 C) (Oral)  Resp 18  SpO2 97% Physical Exam  Nursing note and vitals reviewed. Constitutional: He is oriented to person, place, and time. He appears well-developed and well-nourished. No distress.  HENT:  Head: Normocephalic and atraumatic.  Eyes: Conjunctivae are normal. No scleral icterus.  Neck: Normal range of motion. Neck supple.  Cardiovascular: Normal rate, regular rhythm, normal heart sounds and intact distal pulses.  Exam reveals no gallop and no friction rub.   No murmur heard. Pulmonary/Chest: Effort normal and breath sounds normal.  Neurological: He is alert and oriented to person, place, and time.  Skin: Skin is warm and dry. Rash noted. He is not diaphoretic.  Patient has lichenified excoriated dry plaque like rash located over the face, flexor surfaces of the arms, and the abdomen.  There are no wounds or weeping noted on physical exam.    Psychiatric: He has a normal mood and affect. His behavior is normal. Judgment and thought content normal.    ED Course  Procedures (including critical care time) Labs Review Labs Reviewed - No data to display  Imaging Review No results found.   EKG Interpretation None      MDM   Final diagnoses:  Eczema   Patient has severe eczema.  Will treat today with atarax for itching, a medrol dose pack for severe eczema outbreak, and then  have advised the patient that he can use triamcinolone at the end of the steroid pack.  I have urged the patient to follow-up with his dermatologist at this time.  I have also advised that the patient not use triamcinolone cream on his face.  I have discussed this case with Dr. Florina Ou and he agrees with the treatment plan at this time.  Patient states his understanding of this plan and his need to follow up.      Kenard Gower, PA-C 12/06/13 [phone]

## 2013-12-06 NOTE — ED Provider Notes (Signed)
Medical screening examination/treatment/procedure(s) were conducted as a shared visit with non-physician practitioner(s) and myself.  I personally evaluated the patient during the encounter.  Generalized eczematous rash without weeping or bleeding. We will try a course of oral steroids and then switched to topical preparation. Will provide hydroxyzine as he is having difficulty sleeping due to the itching and hydroxyzine is also sedating.    Wynetta Fines, MD 12/06/13 [phone]

## 2014-01-04 ENCOUNTER — Emergency Department (HOSPITAL_COMMUNITY): Payer: No Typology Code available for payment source

## 2014-01-04 ENCOUNTER — Emergency Department (HOSPITAL_COMMUNITY)
Admission: EM | Admit: 2014-01-04 | Discharge: 2014-01-04 | Disposition: A | Discharge status: Home / Self Care | Payer: No Typology Code available for payment source | Attending: Emergency Medicine | Admitting: Emergency Medicine

## 2014-01-04 ENCOUNTER — Encounter (HOSPITAL_COMMUNITY): Payer: Self-pay | Admitting: Emergency Medicine

## 2014-01-04 DIAGNOSIS — R000 Tachycardia, unspecified: Secondary | ICD-10-CM | POA: Insufficient documentation

## 2014-01-04 DIAGNOSIS — L309 Dermatitis, unspecified: Secondary | ICD-10-CM

## 2014-01-04 DIAGNOSIS — R609 Edema, unspecified: Secondary | ICD-10-CM | POA: Insufficient documentation

## 2014-01-04 DIAGNOSIS — L259 Unspecified contact dermatitis, unspecified cause: Secondary | ICD-10-CM | POA: Insufficient documentation

## 2014-01-04 HISTORY — DX: Dermatitis, unspecified: L30.9

## 2014-01-04 LAB — COMPREHENSIVE METABOLIC PANEL
ALT: 22 U/L (ref 0–53)
AST: 27 U/L (ref 0–37)
Albumin: 2.8 g/dL — ABNORMAL LOW (ref 3.5–5.2)
Alkaline Phosphatase: 66 U/L (ref 39–117)
BUN: 13 mg/dL (ref 6–23)
CALCIUM: 8.8 mg/dL (ref 8.4–10.5)
CO2: 21 mEq/L (ref 19–32)
CREATININE: 1.25 mg/dL (ref 0.50–1.35)
Chloride: 100 mEq/L (ref 96–112)
GFR calc non Af Amer: 60 mL/min — ABNORMAL LOW (ref >90)
GFR, EST AFRICAN AMERICAN: 70 mL/min — AB (ref >90)
Glucose, Bld: 106 mg/dL — ABNORMAL HIGH (ref 70–99)
Potassium: 4.1 mEq/L (ref 3.7–5.3)
Sodium: 138 mEq/L (ref 137–147)
Total Bilirubin: 0.3 mg/dL (ref 0.3–1.2)
Total Protein: 6.1 g/dL (ref 6.0–8.3)

## 2014-01-04 LAB — CBC WITH DIFFERENTIAL/PLATELET
BASOS ABS: 0 10*3/uL (ref 0.0–0.1)
Basophils Relative: 0 % (ref 0–1)
EOS PCT: 13 % — AB (ref 0–5)
Eosinophils Absolute: 1.2 10*3/uL — ABNORMAL HIGH (ref 0.0–0.7)
HEMATOCRIT: 42.8 % (ref 39.0–52.0)
Hemoglobin: 14.5 g/dL (ref 13.0–17.0)
LYMPHS ABS: 2.5 10*3/uL (ref 0.7–4.0)
Lymphocytes Relative: 27 % (ref 12–46)
MCH: 29.9 pg (ref 26.0–34.0)
MCHC: 33.9 g/dL (ref 30.0–36.0)
MCV: 88.2 fL (ref 78.0–100.0)
MONO ABS: 0.7 10*3/uL (ref 0.1–1.0)
Monocytes Relative: 7 % (ref 3–12)
Neutro Abs: 4.8 10*3/uL (ref 1.7–7.7)
Neutrophils Relative %: 53 % (ref 43–77)
Platelets: 463 10*3/uL — ABNORMAL HIGH (ref 150–400)
RBC: 4.85 MIL/uL (ref 4.22–5.81)
RDW: 13.4 % (ref 11.5–15.5)
WBC: 9.2 10*3/uL (ref 4.0–10.5)

## 2014-01-04 LAB — TROPONIN I

## 2014-01-04 MED ORDER — PREDNISONE 20 MG PO TABS: 60.0000 mg | ORAL_TABLET | Freq: Every day | ORAL | Status: DC

## 2014-01-04 MED ORDER — PREDNISONE 20 MG PO TABS
60.0000 mg | ORAL_TABLET | Freq: Once | ORAL | Status: AC
  Administered 2014-01-04: 60 mg via ORAL
  Filled 2014-01-04: qty 3

## 2014-01-04 NOTE — ED Notes (Signed)
Pt watching TV

## 2014-01-04 NOTE — Discharge Instructions (Signed)
Eczema Eczema, also called atopic dermatitis, is a skin disorder that causes inflammation of the skin. It causes a red rash and dry, scaly skin. The skin becomes very itchy. Eczema is generally worse during the cooler winter months and often improves with the warmth of summer. Eczema usually starts showing signs in infancy. Some children outgrow eczema, but it may last through adulthood.  CAUSES  The exact cause of eczema is not known, but it appears to run in families. People with eczema often have a family history of eczema, allergies, asthma, or hay fever. Eczema is not contagious. Flare-ups of the condition may be caused by:   Contact with something you are sensitive or allergic to.   Stress. SIGNS AND SYMPTOMS  Dry, scaly skin.   Red, itchy rash.   Itchiness. This may occur before the skin rash and may be very intense.  DIAGNOSIS  The diagnosis of eczema is usually made based on symptoms and medical history. TREATMENT  Eczema cannot be cured, but symptoms usually can be controlled with treatment and other strategies. A treatment plan might include:  Controlling the itching and scratching.   Use over-the-counter antihistamines as directed for itching. This is especially useful at night when the itching tends to be worse.   Use over-the-counter steroid creams as directed for itching.   Avoid scratching. Scratching makes the rash and itching worse. It may also result in a skin infection (impetigo) due to a break in the skin caused by scratching.   Keeping the skin well moisturized with creams every day. This will seal in moisture and help prevent dryness. Lotions that contain alcohol and water should be avoided because they can dry the skin.   Limiting exposure to things that you are sensitive or allergic to (allergens).   Recognizing situations that cause stress.   Developing a plan to manage stress.  HOME CARE INSTRUCTIONS   Only take over-the-counter or  prescription medicines as directed by your health care provider.   Do not use anything on the skin without checking with your health care provider.   Keep baths or showers short (5 minutes) in warm (not hot) water. Use mild cleansers for bathing. These should be unscented. You may add nonperfumed bath oil to the bath water. It is best to avoid soap and bubble bath.   Immediately after a bath or shower, when the skin is still damp, apply a moisturizing ointment to the entire body. This ointment should be a petroleum ointment. This will seal in moisture and help prevent dryness. The thicker the ointment, the better. These should be unscented.   Keep fingernails cut short. Children with eczema may need to wear soft gloves or mittens at night after applying an ointment.   Dress in clothes made of cotton or cotton blends. Dress lightly, because heat increases itching.   A child with eczema should stay away from anyone with fever blisters or cold sores. The virus that causes fever blisters (herpes simplex) can cause a serious skin infection in children with eczema. SEEK MEDICAL CARE IF:   Your itching interferes with sleep.   Your rash gets worse or is not better within 1 week after starting treatment.   You see pus or soft yellow scabs in the rash area.   You have a fever.   You have a rash flare-up after contact with someone who has fever blisters.  Document Released: 06/26/2000 Document Revised: 04/19/2013 Document Reviewed: 01/30/2013 [facility] Patient Information 2015 [place], [place]. This information  is not intended to replace advice given to you by your health care provider. Make sure you discuss any questions you have with your health care provider.  Psoriasis Psoriasis is a common, long-lasting (chronic) inflammation of the skin. It affects both men and women equally, of all ages and all races. Psoriasis cannot be passed from person to person (not contagious). Psoriasis  varies from mild to very severe. When severe, it can greatly affect your quality of life. Psoriasis is an inflammatory disorder affecting the skin as well as other organs including the joints (causing an arthritis). With psoriasis, the skin sheds its top layer of cells more rapidly than it does in someone without psoriasis. CAUSES  The cause of psoriasis is largely unknown. Genetics, your immune system, and the environment seem to play a role in causing psoriasis. Factors that can make psoriasis worse include:  Damage or trauma to the skin, such as cuts, scrapes, and sunburn. This damage often causes new areas of psoriasis (lesions).  Winter dryness and lack of sunlight.  Medicines such as lithium, beta-blockers, antimalarial drugs, ACE inhibitors, nonsteroidal anti-inflammatory drugs (ibuprofen, aspirin), and terbinafine. Let your caregiver know if you are taking any of these drugs.  Alcohol. Excessive alcohol use should be avoided if you have psoriasis. Drinking large amounts of alcohol can affect:  How well your psoriasis treatment works.  How safe your psoriasis treatment is.  Smoking. If you smoke, ask your caregiver for help to quit.  Stress.  Bacterial or viral infections.  Arthritis. Arthritis associated with psoriasis (psoriatic arthritis) affects less than 10% of patients with psoriasis. The arthritic intensity does not always match the skin psoriasis intensity. It is important to let your caregiver know if your joints hurt or if they are stiff. SYMPTOMS  The most common form of psoriasis begins with little red bumps that gradually become larger. The bumps begin to form scales that flake off easily. The lower layers of scales stick together. When these scales are scratched or removed, the underlying skin is tender and bleeds easily. These areas then grow in size and may become large. Psoriasis often creates a rash that looks the same on both sides of the body (symmetrical). It often  affects the elbows, knees, groin, genitals, arms, legs, scalp, and nails. Affected nails often have pitting, loosen, thicken, crumble, and are difficult to treat.  "Inverse psoriasis"occurs in the armpits, under breasts, in skin folds, and around the groin, buttocks, and genitals.  "Guttate psoriasis" generally occurs in children and young adults following a recent sore throat (strep throat). It begins with many small, red, scaly spots on the skin. It clears spontaneously in weeks or a few months without treatment. DIAGNOSIS  Psoriasis is diagnosed by physical exam. A tissue sample (biopsy) may also be taken. TREATMENT The treatment of psoriasis depends on your age, health, and living conditions.  Steroid (cortisone) creams, lotions, and ointments may be used. These treatments are associated with thinning of the skin, blood vessels that get larger (dilated), loss of skin pigmentation, and easy bruising. It is important to use these steroids as directed by your caregiver. Only treat the affected areas and not the normal, unaffected skin. People on long-term steroid treatment should wear a medical alert bracelet. Injections may be used in areas that are difficult to treat.  Scalp treatments are available as shampoos, solutions, sprays, foams, and oils. Avoid scratching the scalp and picking at the scales.  Anthralin medicine works well on areas that are difficult to  treat. However, it stains clothes and skin and may cause temporary irritation.  Synthetic vitamin D (calcipotriene)can be used on small areas. It is available by prescription. The forms of synthetic vitamin D available in health food stores do not help with psoriasis.  Coal tarsare available in various strengths for psoriasis that is difficult to treat. They are one of the longest used treatments for difficult to treat psoriasis. However, they are messy to use.  Light therapy (UV therapy) can be carefully and professionally monitored  in a dermatologist's office. Careful sunbathing is helpful for many people as directed by your caregiver. The exposure should be just long enough to cause a mild redness (erythema) of your skin. Avoid sunburn as this may make the condition worse. Sunscreen (SPF of 30 or higher) should be used to protect against sunburn. Cataracts, wrinkles, and skin aging are some of the harmful side effects of light therapy.  If creams (topical medicines) fail, there are several other options for systemic or oral medicines your caregiver can suggest. Psoriasis can sometimes be very difficult to treat. It can come and go. It is necessary to follow up with your caregiver regularly if your psoriasis is difficult to treat. Usually, with persistence you can get a good amount of relief. Maintaining consistent care is important. Do not change caregivers just because you do not see immediate results. It may take several trials to find the right combination of treatment for you. PREVENTING FLARE-UPS  Wear gloves while you wash dishes, while cleaning, and when you are outside in the cold.  If you have radiators, place a bowl of water or damp towel on the radiator. This will help put water back in the air. You can also use a humidifier to keep the air moist. Try to keep the humidity at about 60% in your home.  Apply moisturizer while your skin is still damp from bathing or showering. This traps water in the skin.  Avoid long, hot baths or showers. Keep soap use to a minimum. Soaps dry out the skin and wash away the protective oils. Use a fragrance free, dye free soap.  Drink enough water and fluids to keep your urine clear or pale yellow. Not drinking enough water depletes your skin's water supply.  Turn off the heat at night and keep it low during the day. Cool air is less drying. SEEK MEDICAL CARE IF:  You have increasing pain in the affected areas.  You have uncontrolled bleeding in the affected areas.  You have  increasing redness or warmth in the affected areas.  You start to have pain or stiffness in your joints.  You start feeling depressed about your condition.  You have a fever. Document Released: 06/26/2000 Document Revised: 09/21/2011 Document Reviewed: 12/22/2010 [facility] Patient Information 2015 [place], [place]. This information is not intended to replace advice given to you by your health care provider. Make sure you discuss any questions you have with your health care provider.

## 2014-01-04 NOTE — ED Provider Notes (Signed)
CSN: [id number]     Arrival date & time 01/04/14  1518 History   First MD Initiated Contact with Patient 01/04/14 1844     Chief Complaint  Patient presents with  . Allergic Reaction     (Consider location/radiation/quality/duration/timing/severity/associated sxs/prior Treatment) Patient is a 63 y.o. male presenting with allergic reaction. The history is provided by the patient.  Allergic Reaction Presenting symptoms: rash    patient with acute on chronic rash. Began using on his face and now involves more of his body. There is somewhat of thickening of the skin. It is itchy. Patient states he is seeing dermatology admitted to them what was producing some lotion. States has not gotten better. States it has gotten worse. No fevers. No trauma. He states he does have a little bit of urinary frequency, but that is usually drinks beer. No chest pain. Occasional trouble breathing and cough. No new soaps no new food exposures no drugs. States he has followup with dermatology. Patient states he does not see a doctor.  Past Medical History  Diagnosis Date  . Eczema    Past Surgical History  Procedure Laterality Date  . I&d extremity Right 09/13/2012    Procedure: IRRIGATION AND DEBRIDEMENT EXTREMITY  RIGHT MIDDLE FINGER WITH REVISION AMPUTATION AND SKIN GRAFTING.;  Surgeon: Roseanne Kaufman, MD;  Location: [place];  Service: Orthopedics;  Laterality: Right;   No family history on file. History  Substance Use Topics  . Smoking status: Never Smoker   . Smokeless tobacco: Not on file  . Alcohol Use: Yes     Comment: beer everyday    Review of Systems  Constitutional: Negative for activity change and appetite change.  Eyes: Negative for pain.  Respiratory: Negative for chest tightness and shortness of breath.   Cardiovascular: Negative for chest pain and leg swelling.  Gastrointestinal: Negative for nausea, vomiting, abdominal pain and diarrhea.  Genitourinary: Negative for flank pain.   Musculoskeletal: Negative for back pain and neck stiffness.  Skin: Positive for rash.  Neurological: Negative for weakness, numbness and headaches.  Psychiatric/Behavioral: Negative for behavioral problems.      Allergies  Review of patient's allergies indicates no known allergies.  Home Medications   Prior to Admission medications   Medication Sig Start Date End Date Taking? Authorizing Provider  Tetrahydrozoline HCl (VISINE OP) Place 1-2 drops into both eyes 2 (two) times daily as needed (redness).   Yes Historical Provider, MD  predniSONE (DELTASONE) 20 MG tablet Take 3 tablets (60 mg total) by mouth daily. 01/04/14   Jasper Riling. Yamina Lenis, MD   BP 157/68  Pulse 101  Temp(Src) 98.3 F (36.8 C) (Oral)  Resp 20  Ht 5\' 10"  (1.778 m)  Wt 224 lb (101.606 kg)  BMI 32.14 kg/m2  SpO2 91% Physical Exam  Constitutional: He is oriented to person, place, and time. He appears well-developed and well-nourished.  HENT:  Head: Normocephalic.  Cardiovascular: Regular rhythm.   Tachycardia  Pulmonary/Chest: Effort normal and breath sounds normal.  Abdominal: Soft. There is no tenderness.  Musculoskeletal: He exhibits edema.  Neurological: He is alert and oriented to person, place, and time.  Skin:  Chronic dermatitis to the face upper chest upper extremities and somewhat lower extremities. There is some pitting edema to bilateral lower extremities. There is some weeping under his ID badge on his right wrist. No fluctuance. There is cracking. There is some lichenification    ED Course  Procedures (including critical care time) Labs Review Labs Reviewed  CBC  WITH DIFFERENTIAL - Abnormal; Notable for the following:    Platelets 463 (*)    Eosinophils Relative 13 (*)    Eosinophils Absolute 1.2 (*)    All other components within normal limits  COMPREHENSIVE METABOLIC PANEL - Abnormal; Notable for the following:    Glucose, Bld 106 (*)    Albumin 2.8 (*)    GFR calc non Af Amer 60 (*)     GFR calc Af Amer 70 (*)    All other components within normal limits  TROPONIN I    Imaging Review Dg Chest 2 View  01/04/2014   CLINICAL DATA:  Tachycardia  EXAM: CHEST  2 VIEW  COMPARISON:  09/13/2012  FINDINGS: Metallic foreign body in the right lateral chest wall. There is no focal parenchymal opacity, pleural effusion, or pneumothorax. The heart and mediastinal contours are unremarkable.  The osseous structures are unremarkable.  IMPRESSION: No active cardiopulmonary disease.   Electronically Signed   By: Kathreen Devoid   On: 01/04/2014 21:19     EKG Interpretation   Date/Time:  Thursday January 04 2014 19:53:24 EDT Ventricular Rate:  102 PR Interval:  144 QRS Duration: 76 QT Interval:  328 QTC Calculation: 427 R Axis:   29 Text Interpretation:  Sinus tachycardia Confirmed by Alvino Chapel  MD, Ovid Curd  [phone]) on 01/04/2014 8:26:05 PM      MDM   Final diagnoses:  Dermatitis    Patient with rash. Acute on chronic. He is to followup with dermatology. Also does not a primary care has hypertension. Basic lab done and is reassuring   Jasper Riling. Alvino Chapel, MD 01/06/14 [phone]

## 2014-01-04 NOTE — ED Notes (Addendum)
Pt here for eczema and an allergic reaction.  He states he was tx for same approx 1.5 months ago, but this feels worse.  2+ pitting edema bil LE and edema and numbness to R hand.  Denies sob.  Airway patent.

## 2014-01-18 ENCOUNTER — Encounter (HOSPITAL_COMMUNITY): Payer: Self-pay | Admitting: Emergency Medicine

## 2014-01-18 ENCOUNTER — Emergency Department (INDEPENDENT_AMBULATORY_CARE_PROVIDER_SITE_OTHER)
Admission: EM | Admit: 2014-01-18 | Discharge: 2014-01-18 | Disposition: A | Discharge status: Home / Self Care | Payer: No Typology Code available for payment source | Source: Home / Self Care | Attending: Emergency Medicine | Admitting: Emergency Medicine

## 2014-01-18 DIAGNOSIS — L2089 Other atopic dermatitis: Secondary | ICD-10-CM

## 2014-01-18 DIAGNOSIS — L03019 Cellulitis of unspecified finger: Secondary | ICD-10-CM

## 2014-01-18 DIAGNOSIS — L03011 Cellulitis of right finger: Secondary | ICD-10-CM

## 2014-01-18 DIAGNOSIS — L209 Atopic dermatitis, unspecified: Secondary | ICD-10-CM

## 2014-01-18 MED ORDER — CEPHALEXIN 500 MG PO CAPS: 500.0000 mg | ORAL_CAPSULE | Freq: Four times a day (QID) | ORAL | Status: DC

## 2014-01-18 NOTE — Discharge Instructions (Signed)
Fingertip Infection °When an infection is around the nail, it is called a paronychia. When it appears over the tip of the finger, it is called a felon. These infections are due to minor injuries or cracks in the skin. If they are not treated properly, they can lead to bone infection and permanent damage to the fingernail. °Incision and drainage is necessary if a pus pocket (an abscess) has formed. Antibiotics and pain medicine may also be needed. Keep your hand elevated for the next 2-3 days to reduce swelling and pain. If a pack was placed in the abscess, it should be removed in 1-2 days by your caregiver. Soak the finger in warm water for 20 minutes 4 times daily to help promote drainage. °Keep the hands as dry as possible. Wear protective gloves with cotton liners. See your caregiver for follow-up care as recommended.  °HOME CARE INSTRUCTIONS  °· Keep wound clean, dry and dressed as suggested by your caregiver. °· Soak in warm salt water for fifteen minutes, four times per day for bacterial infections. °· Your caregiver will prescribe an antibiotic if a bacterial infection is suspected. Take antibiotics as directed and finish the prescription, even if the problem appears to be improving before the medicine is gone. °· Only take over-the-counter or prescription medicines for pain, discomfort, or fever as directed by your caregiver. °SEEK IMMEDIATE MEDICAL CARE IF: °· There is redness, swelling, or increasing pain in the wound. °· Pus or any other unusual drainage is coming from the wound. °· An unexplained oral temperature above 102° F (38.9° C) develops. °· You notice a foul smell coming from the wound or dressing. °MAKE SURE YOU:  °· Understand these instructions. °· Monitor your condition. °· Contact your caregiver if you are getting worse or not improving. °Document Released: 08/06/2004 Document Revised: 09/21/2011 Document Reviewed: 08/02/2008 °ExitCare® Patient Information ©2015 [employer]. This  information is not intended to replace advice given to you by your health care provider. Make sure you discuss any questions you have with your health care provider. ° °

## 2014-01-18 NOTE — ED Provider Notes (Signed)
CSN: [id number]     Arrival date & time 01/18/14  1310 History   First MD Initiated Contact with Patient 01/18/14 1431     Chief Complaint  Patient presents with  . Hand Pain    right thumb infection  . Leg Swelling   (Consider location/radiation/quality/duration/timing/severity/associated sxs/prior Treatment) HPI Comments: Presents with right thumb paronychia that began on 01/15/2014. States he has severe eczema and his skin often cracks and he thinks this is how the infection began.  Eczema managed by local dermatologist and [facility] dept of dermatology  Patient is a 63 y.o. male presenting with hand pain. The history is provided by the patient.  Hand Pain    Past Medical History  Diagnosis Date  . Eczema    Past Surgical History  Procedure Laterality Date  . I&d extremity Right 09/13/2012    Procedure: IRRIGATION AND DEBRIDEMENT EXTREMITY  RIGHT MIDDLE FINGER WITH REVISION AMPUTATION AND SKIN GRAFTING.;  Surgeon: Roseanne Kaufman, MD;  Location: [facility];  Service: Orthopedics;  Laterality: Right;   History reviewed. No pertinent family history. History  Substance Use Topics  . Smoking status: Never Smoker   . Smokeless tobacco: Not on file  . Alcohol Use: Yes     Comment: beer everyday    Review of Systems  All other systems reviewed and are negative.   Allergies  Review of patient's allergies indicates no known allergies.  Home Medications   Prior to Admission medications   Medication Sig Start Date End Date Taking? Authorizing Provider  cephALEXin (KEFLEX) 500 MG capsule Take 1 capsule (500 mg total) by mouth 4 (four) times daily. X 7 days 01/18/14   Annett Gula Bruna Dills, PA  predniSONE (DELTASONE) 20 MG tablet Take 3 tablets (60 mg total) by mouth daily. 01/04/14   Jasper Riling. Alvino Chapel, MD  Tetrahydrozoline HCl (VISINE OP) Place 1-2 drops into both eyes 2 (two) times daily as needed (redness).    Historical Provider, MD   BP 129/68  Pulse 98  Temp(Src) 98.4 F (36.9 C)  (Oral)  Resp 18  SpO2 99% Physical Exam  Nursing note and vitals reviewed. Constitutional: He is oriented to person, place, and time. He appears well-developed and well-nourished. No distress.  HENT:  Head: Normocephalic and atraumatic.  Cardiovascular: Normal rate.   Pulmonary/Chest: Effort normal.  Musculoskeletal: Normal range of motion.  Neurological: He is alert and oriented to person, place, and time.  Skin: Skin is warm and dry.  +right thumb paronychia at edge of thumbnail  Psychiatric: He has a normal mood and affect. His behavior is normal.    ED Course  INCISION AND DRAINAGE Date/Time: 01/18/2014 4:19 PM Performed by: Griselda Miner LEE Authorized by: Griselda Miner LEE Consent: Verbal consent obtained. Risks and benefits: risks, benefits and alternatives were discussed Consent given by: patient Patient understanding: patient states understanding of the procedure being performed Patient identity confirmed: verbally with patient Time out: Immediately prior to procedure a "time out" was called to verify the correct patient, procedure, equipment, support staff and site/side marked as required. Type: abscess Body area: upper extremity Location details: right thumb Local anesthetic: topical anesthetic Patient sedated: no Scalpel size: 11 Incision type: single straight Complexity: simple Drainage: purulent Drainage amount: moderate Wound treatment: wound left open Packing material: none Patient tolerance: Patient tolerated the procedure well with no immediate complications. Comments: sterile dressing applied   (including critical care time) Labs Review Labs Reviewed - No data to display  Imaging Review No results found.  MDM   1. Atopic dermatitis   2. Paronychia of finger, right    Warm epsom salt water soaks TID until healed and Cephalexin as prescribed. Follow up with dermatology regarding atopic dermatitis.    [place], [place] 01/18/14  1623

## 2014-01-18 NOTE — ED Notes (Signed)
Reports infection of right thumb with drainage and odor. Present since 7/6.   Also c/o bilateral leg swelling with pain and tenderness to touch.  Pt has been taking ibuprofen with mild relief in pain.Marland Kitchen

## 2014-01-20 NOTE — ED Provider Notes (Signed)
Medical screening examination/treatment/procedure(s) were performed by non-physician practitioner and as supervising physician I was immediately available for consultation/collaboration.  Philipp Deputy, M.D.  Harden Mo, MD 01/20/14 0900

## 2014-01-29 ENCOUNTER — Emergency Department (HOSPITAL_COMMUNITY): Payer: No Typology Code available for payment source

## 2014-01-29 ENCOUNTER — Encounter (HOSPITAL_COMMUNITY): Payer: Self-pay | Admitting: Emergency Medicine

## 2014-01-29 ENCOUNTER — Emergency Department (HOSPITAL_COMMUNITY)
Admission: EM | Admit: 2014-01-29 | Discharge: 2014-01-29 | Disposition: A | Discharge status: Home / Self Care | Payer: No Typology Code available for payment source | Attending: Emergency Medicine | Admitting: Emergency Medicine

## 2014-01-29 DIAGNOSIS — L209 Atopic dermatitis, unspecified: Secondary | ICD-10-CM

## 2014-01-29 DIAGNOSIS — L2089 Other atopic dermatitis: Secondary | ICD-10-CM | POA: Insufficient documentation

## 2014-01-29 DIAGNOSIS — R600 Localized edema: Secondary | ICD-10-CM

## 2014-01-29 DIAGNOSIS — Z79899 Other long term (current) drug therapy: Secondary | ICD-10-CM | POA: Insufficient documentation

## 2014-01-29 DIAGNOSIS — R609 Edema, unspecified: Secondary | ICD-10-CM | POA: Insufficient documentation

## 2014-01-29 DIAGNOSIS — Z4801 Encounter for change or removal of surgical wound dressing: Secondary | ICD-10-CM | POA: Insufficient documentation

## 2014-01-29 DIAGNOSIS — Z5189 Encounter for other specified aftercare: Secondary | ICD-10-CM

## 2014-01-29 LAB — CBC
HCT: 37.8 % — ABNORMAL LOW (ref 39.0–52.0)
Hemoglobin: 12.2 g/dL — ABNORMAL LOW (ref 13.0–17.0)
MCH: 29.3 pg (ref 26.0–34.0)
MCHC: 32.3 g/dL (ref 30.0–36.0)
MCV: 90.6 fL (ref 78.0–100.0)
PLATELETS: 363 10*3/uL (ref 150–400)
RBC: 4.17 MIL/uL — AB (ref 4.22–5.81)
RDW: 14.4 % (ref 11.5–15.5)
WBC: 9.7 10*3/uL (ref 4.0–10.5)

## 2014-01-29 LAB — PRO B NATRIURETIC PEPTIDE: Pro B Natriuretic peptide (BNP): 487 pg/mL — ABNORMAL HIGH (ref 0–125)

## 2014-01-29 LAB — I-STAT CHEM 8, ED
BUN: 15 mg/dL (ref 6–23)
Calcium, Ion: 1.16 mmol/L (ref 1.13–1.30)
Chloride: 107 mEq/L (ref 96–112)
Creatinine, Ser: 1.3 mg/dL (ref 0.50–1.35)
Glucose, Bld: 102 mg/dL — ABNORMAL HIGH (ref 70–99)
HCT: 39 % (ref 39.0–52.0)
HEMOGLOBIN: 13.3 g/dL (ref 13.0–17.0)
Potassium: 4.1 mEq/L (ref 3.7–5.3)
SODIUM: 142 meq/L (ref 137–147)
TCO2: 23 mmol/L (ref 0–100)

## 2014-01-29 MED ORDER — SULFAMETHOXAZOLE-TMP DS 800-160 MG PO TABS: 1.0000 | ORAL_TABLET | Freq: Two times a day (BID) | ORAL | Status: DC

## 2014-01-29 MED ORDER — HYDROCORTISONE 1 % EX CREA
TOPICAL_CREAM | Freq: Once | CUTANEOUS | Status: AC
  Administered 2014-01-29: 1 via TOPICAL
  Filled 2014-01-29: qty 28

## 2014-01-29 MED ORDER — HYDROCODONE-ACETAMINOPHEN 5-325 MG PO TABS
1.0000 | ORAL_TABLET | Freq: Once | ORAL | Status: AC
  Administered 2014-01-29: 1 via ORAL
  Filled 2014-01-29: qty 1

## 2014-01-29 MED ORDER — HYDROCODONE-ACETAMINOPHEN 5-325 MG PO TABS: 1.0000 | ORAL_TABLET | Freq: Four times a day (QID) | ORAL | Status: DC | PRN

## 2014-01-29 MED ORDER — FUROSEMIDE 40 MG PO TABS: 40.0000 mg | ORAL_TABLET | Freq: Two times a day (BID) | ORAL | Status: DC
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
## 2014-01-29 NOTE — Discharge Instructions (Signed)
Please follow up with your primary care physician in 1-2 days. If you do not have one please call the [place] [facility] number listed above. Please follow up with your dermatologist to schedule a follow up appointment.  Please take your antibiotic until completion. Please take Lasix as prescribed. Please take pain medication and/or muscle relaxants as prescribed and as needed for pain. Please do not drive on narcotic pain medication or on muscle relaxants. Please read all discharge instructions and return precautions.   Wound Care Wound care helps prevent pain and infection.  You may need a tetanus shot if:  You cannot remember when you had your last tetanus shot.  You have never had a tetanus shot.  The injury broke your skin. If you need a tetanus shot and you choose not to have one, you may get tetanus. Sickness from tetanus can be serious. HOME CARE   Only take medicine as told by your doctor.  Clean the wound daily with mild soap and water.  Change any bandages (dressings) as told by your doctor.  Put medicated cream and a bandage on the wound as told by your doctor.  Change the bandage if it gets wet, dirty, or starts to smell.  Take showers. Do not take baths, swim, or do anything that puts your wound under water.  Rest and raise (elevate) the wound until the pain and puffiness (swelling) are better.  Keep all doctor visits as told. GET HELP RIGHT AWAY IF:   Yellowish-white fluid (pus) comes from the wound.  Medicine does not lessen your pain.  There is a red streak going away from the wound.  You have a fever. MAKE SURE YOU:   Understand these instructions.  Will watch your condition.  Will get help right away if you are not doing well or get worse. Document Released: 04/07/2008 Document Revised: 09/21/2011 Document Reviewed: 11/02/2010 [facility] Patient Information 2015 [place], [place]. This information is not intended to replace advice given to  you by your health care provider. Make sure you discuss any questions you have with your health care provider. Edema Edema is an abnormal buildup of fluids in your bodytissues. Edema is somewhatdependent on gravity to pull the fluid to the lowest place in your body. That makes the condition more common in the legs and thighs (lower extremities). Painless swelling of the feet and ankles is common and becomes more likely as you get older. It is also common in looser tissues, like around your eyes.  When the affected area is squeezed, the fluid may move out of that spot and leave a dent for a few moments. This dent is called pitting.  CAUSES  There are many possible causes of edema. Eating too much salt and being on your feet or sitting for a long time can cause edema in your legs and ankles. Hot weather may make edema worse. Common medical causes of edema include:  Heart failure.  Liver disease.  Kidney disease.  Weak blood vessels in your legs.  Cancer.  An injury.  Pregnancy.  Some medications.  Obesity. SYMPTOMS  Edema is usually painless.Your skin may look swollen or shiny.  DIAGNOSIS  Your health care provider may be able to diagnose edema by asking about your medical history and doing a physical exam. You may need to have tests such as X-rays, an electrocardiogram, or blood tests to check for medical conditions that may cause edema.  TREATMENT  Edema treatment depends on the cause. If  you have heart, liver, or kidney disease, you need the treatment appropriate for these conditions. General treatment may include:  Elevation of the affected body part above the level of your heart.  Compression of the affected body part. Pressure from elastic bandages or support stockings squeezes the tissues and forces fluid back into the blood vessels. This keeps fluid from entering the tissues.  Restriction of fluid and salt intake.  Use of a water pill (diuretic). These medications are  appropriate only for some types of edema. They pull fluid out of your body and make you urinate more often. This gets rid of fluid and reduces swelling, but diuretics can have side effects. Only use diuretics as directed by your health care provider. HOME CARE INSTRUCTIONS   Keep the affected body part above the level of your heart when you are lying down.   Do not sit still or stand for prolonged periods.   Do not put anything directly under your knees when lying down.  Do not wear constricting clothing or garters on your upper legs.   Exercise your legs to work the fluid back into your blood vessels. This may help the swelling go down.   Wear elastic bandages or support stockings to reduce ankle swelling as directed by your health care provider.   Eat a low-salt diet to reduce fluid if your health care provider recommends it.   Only take medicines as directed by your health care provider. SEEK MEDICAL CARE IF:   Your edema is not responding to treatment.  You have heart, liver, or kidney disease and notice symptoms of edema.  You have edema in your legs that does not improve after elevating them.   You have sudden and unexplained weight gain. SEEK IMMEDIATE MEDICAL CARE IF:   You develop shortness of breath or chest pain.   You cannot breathe when you lie down.  You develop pain, redness, or warmth in the swollen areas.   You have heart, liver, or kidney disease and suddenly get edema.  You have a fever and your symptoms suddenly get worse. MAKE SURE YOU:   Understand these instructions.  Will watch your condition.  Will get help right away if you are not doing well or get worse. Document Released: 06/29/2005 Document Revised: 07/04/2013 Document Reviewed: 04/21/2013 [facility] Patient Information 2015 [place], [place]. This information is not intended to replace advice given to you by your health care provider. Make sure you discuss any questions you have with  your health care provider.  Eczema Eczema, also called atopic dermatitis, is a skin disorder that causes inflammation of the skin. It causes a red rash and dry, scaly skin. The skin becomes very itchy. Eczema is generally worse during the cooler winter months and often improves with the warmth of summer. Eczema usually starts showing signs in infancy. Some children outgrow eczema, but it may last through adulthood.  CAUSES  The exact cause of eczema is not known, but it appears to run in families. People with eczema often have a family history of eczema, allergies, asthma, or hay fever. Eczema is not contagious. Flare-ups of the condition may be caused by:   Contact with something you are sensitive or allergic to.   Stress. SIGNS AND SYMPTOMS  Dry, scaly skin.   Red, itchy rash.   Itchiness. This may occur before the skin rash and may be very intense.  DIAGNOSIS  The diagnosis of eczema is usually made based on symptoms and medical history. TREATMENT  Eczema cannot be cured, but symptoms usually can be controlled with treatment and other strategies. A treatment plan might include:  Controlling the itching and scratching.   Use over-the-counter antihistamines as directed for itching. This is especially useful at night when the itching tends to be worse.   Use over-the-counter steroid creams as directed for itching.   Avoid scratching. Scratching makes the rash and itching worse. It may also result in a skin infection (impetigo) due to a break in the skin caused by scratching.   Keeping the skin well moisturized with creams every day. This will seal in moisture and help prevent dryness. Lotions that contain alcohol and water should be avoided because they can dry the skin.   Limiting exposure to things that you are sensitive or allergic to (allergens).   Recognizing situations that cause stress.   Developing a plan to manage stress.  HOME CARE INSTRUCTIONS   Only  take over-the-counter or prescription medicines as directed by your health care provider.   Do not use anything on the skin without checking with your health care provider.   Keep baths or showers short (5 minutes) in warm (not hot) water. Use mild cleansers for bathing. These should be unscented. You may add nonperfumed bath oil to the bath water. It is best to avoid soap and bubble bath.   Immediately after a bath or shower, when the skin is still damp, apply a moisturizing ointment to the entire body. This ointment should be a petroleum ointment. This will seal in moisture and help prevent dryness. The thicker the ointment, the better. These should be unscented.   Keep fingernails cut short. Children with eczema may need to wear soft gloves or mittens at night after applying an ointment.   Dress in clothes made of cotton or cotton blends. Dress lightly, because heat increases itching.   A child with eczema should stay away from anyone with fever blisters or cold sores. The virus that causes fever blisters (herpes simplex) can cause a serious skin infection in children with eczema. SEEK MEDICAL CARE IF:   Your itching interferes with sleep.   Your rash gets worse or is not better within 1 week after starting treatment.   You see pus or soft yellow scabs in the rash area.   You have a fever.   You have a rash flare-up after contact with someone who has fever blisters.  Document Released: 06/26/2000 Document Revised: 04/19/2013 Document Reviewed: 01/30/2013 [facility] Patient Information 2015 [place], [place]. This information is not intended to replace advice given to you by your health care provider. Make sure you discuss any questions you have with your health care provider.

## 2014-01-29 NOTE — ED Notes (Signed)
PA at bedside, reviewing plan for discharge with patient and family.

## 2014-01-29 NOTE — ED Notes (Signed)
Per EMS: pt coming from home with c/o bilateral foot swelling starting two weeks ago. Pt also c/o eczema flare up. Pt A&Ox4, respirations equal and unlabored, skin warm and dry

## 2014-01-31 ENCOUNTER — Encounter: Payer: Self-pay | Admitting: Internal Medicine

## 2014-01-31 ENCOUNTER — Ambulatory Visit: Payer: No Typology Code available for payment source | Attending: Internal Medicine | Admitting: Internal Medicine

## 2014-01-31 VITALS — BP 118/63 | HR 96 | Temp 98.0°F | Resp 20 | Ht 70.0 in | Wt 212.4 lb

## 2014-01-31 DIAGNOSIS — R609 Edema, unspecified: Secondary | ICD-10-CM | POA: Insufficient documentation

## 2014-01-31 DIAGNOSIS — H269 Unspecified cataract: Secondary | ICD-10-CM | POA: Insufficient documentation

## 2014-01-31 DIAGNOSIS — Z7689 Persons encountering health services in other specified circumstances: Secondary | ICD-10-CM

## 2014-01-31 DIAGNOSIS — Z7189 Other specified counseling: Secondary | ICD-10-CM

## 2014-01-31 DIAGNOSIS — L209 Atopic dermatitis, unspecified: Secondary | ICD-10-CM

## 2014-01-31 DIAGNOSIS — L2089 Other atopic dermatitis: Secondary | ICD-10-CM | POA: Insufficient documentation

## 2014-01-31 LAB — COMPLETE METABOLIC PANEL WITH GFR
ALBUMIN: 2.7 g/dL — AB (ref 3.5–5.2)
ALT: 33 U/L (ref 0–53)
AST: 47 U/L — ABNORMAL HIGH (ref 0–37)
Alkaline Phosphatase: 57 U/L (ref 39–117)
BUN: 14 mg/dL (ref 6–23)
CHLORIDE: 103 meq/L (ref 96–112)
CO2: 28 meq/L (ref 19–32)
Calcium: 8.1 mg/dL — ABNORMAL LOW (ref 8.4–10.5)
Creat: 1.43 mg/dL — ABNORMAL HIGH (ref 0.50–1.35)
GFR, EST AFRICAN AMERICAN: 60 mL/min
GFR, Est Non African American: 52 mL/min — ABNORMAL LOW
Glucose, Bld: 88 mg/dL (ref 70–99)
POTASSIUM: 4.2 meq/L (ref 3.5–5.3)
Sodium: 141 mEq/L (ref 135–145)
Total Bilirubin: 0.3 mg/dL (ref 0.2–1.2)
Total Protein: 5.5 g/dL — ABNORMAL LOW (ref 6.0–8.3)

## 2014-01-31 LAB — POCT GLYCOSYLATED HEMOGLOBIN (HGB A1C): Hemoglobin A1C: 5.8

## 2014-01-31 LAB — GLUCOSE, POCT (MANUAL RESULT ENTRY): POC GLUCOSE: 86 mg/dL (ref 70–99)

## 2014-01-31 MED ORDER — METHYLPREDNISOLONE SODIUM SUCC 125 MG IJ SOLR
60.0000 mg | Freq: Once | INTRAMUSCULAR | Status: AC
  Administered 2014-01-31: 60 mg via INTRAMUSCULAR

## 2014-01-31 MED ORDER — HYDROXYZINE PAMOATE 25 MG PO CAPS: 25.0000 mg | ORAL_CAPSULE | Freq: Two times a day (BID) | ORAL | Status: DC | PRN

## 2014-01-31 MED ORDER — TRIAMCINOLONE ACETONIDE 0.1 % EX CREA: 1.0000 "application " | TOPICAL_CREAM | Freq: Two times a day (BID) | CUTANEOUS | Status: DC

## 2014-01-31 NOTE — Patient Instructions (Signed)
Eczema Eczema, also called atopic dermatitis, is a skin disorder that causes inflammation of the skin. It causes a red rash and dry, scaly skin. The skin becomes very itchy. Eczema is generally worse during the cooler winter months and often improves with the warmth of summer. Eczema usually starts showing signs in infancy. Some children outgrow eczema, but it may last through adulthood.  CAUSES  The exact cause of eczema is not known, but it appears to run in families. People with eczema often have a family history of eczema, allergies, asthma, or hay fever. Eczema is not contagious. Flare-ups of the condition may be caused by:   Contact with something you are sensitive or allergic to.   Stress. SIGNS AND SYMPTOMS  Dry, scaly skin.   Red, itchy rash.   Itchiness. This may occur before the skin rash and may be very intense.  DIAGNOSIS  The diagnosis of eczema is usually made based on symptoms and medical history. TREATMENT  Eczema cannot be cured, but symptoms usually can be controlled with treatment and other strategies. A treatment plan might include:  Controlling the itching and scratching.   Use over-the-counter antihistamines as directed for itching. This is especially useful at night when the itching tends to be worse.   Use over-the-counter steroid creams as directed for itching.   Avoid scratching. Scratching makes the rash and itching worse. It may also result in a skin infection (impetigo) due to a break in the skin caused by scratching.   Keeping the skin well moisturized with creams every day. This will seal in moisture and help prevent dryness. Lotions that contain alcohol and water should be avoided because they can dry the skin.   Limiting exposure to things that you are sensitive or allergic to (allergens).   Recognizing situations that cause stress.   Developing a plan to manage stress.  HOME CARE INSTRUCTIONS   Only take over-the-counter or  prescription medicines as directed by your health care provider.   Do not use anything on the skin without checking with your health care provider.   Keep baths or showers short (5 minutes) in warm (not hot) water. Use mild cleansers for bathing. These should be unscented. You may add nonperfumed bath oil to the bath water. It is best to avoid soap and bubble bath.   Immediately after a bath or shower, when the skin is still damp, apply a moisturizing ointment to the entire body. This ointment should be a petroleum ointment. This will seal in moisture and help prevent dryness. The thicker the ointment, the better. These should be unscented.   Keep fingernails cut short. Children with eczema may need to wear soft gloves or mittens at night after applying an ointment.   Dress in clothes made of cotton or cotton blends. Dress lightly, because heat increases itching.   A child with eczema should stay away from anyone with fever blisters or cold sores. The virus that causes fever blisters (herpes simplex) can cause a serious skin infection in children with eczema. SEEK MEDICAL CARE IF:   Your itching interferes with sleep.   Your rash gets worse or is not better within 1 week after starting treatment.   You see pus or soft yellow scabs in the rash area.   You have a fever.   You have a rash flare-up after contact with someone who has fever blisters.  Document Released: 06/26/2000 Document Revised: 04/19/2013 Document Reviewed: 01/30/2013 Exit[employer] Patient Information 2015 [employer]. This information   is not intended to replace advice given to you by your health care provider. Make sure you discuss any questions you have with your health care provider.  

## 2014-01-31 NOTE — Progress Notes (Signed)
Patient presents to establish care States recently seen in ED for 2 week history of bilateral lower extremity edema. Rates pain in LE and feet at 7.5/10 at present. States he's using his sister's Tylenol #3 with some relief. Right thumb wound noted. Denies injury. Also, c/o eczema for 1 year.

## 2014-01-31 NOTE — ED Provider Notes (Signed)
Medical screening examination/treatment/procedure(s) were performed by non-physician practitioner and as supervising physician I was immediately available for consultation/collaboration.   EKG Interpretation None       Jasper Riling. Alvino Chapel, MD 01/31/14 [phone]

## 2014-01-31 NOTE — Progress Notes (Signed)
Patient ID: Raymond Stone, male   DOB: [birth year], 63 y.o.   MRN: [id number]  KYH:[id number]  TDV:[id number]  DOB - 06-06-1951  CC:  Chief Complaint  Patient presents with  . Establish Care  . Eczema       HPI: Raymond Stone is a 63 y.o. male here today to establish medical care.  Patient reports that he was seen at Urgent care for a thumb nail infection and it was then I & D.  Patient was then given Keflex and went back to the ER and was given Bactrim.  Patient reports that for over one year he has had a bad flare of eczema.  Patient reports that he was being seen by a dermatologist but he is only been given creams that do not help.  He has currently using hydrocortisone cream 1%.  Patient reports that his twin was diagnosed with CHF which has caused her to retain fluid and he is now worried because he has had some BLE edema.  Patient also reports that his twin has severe eczema as well. Patient reports that his legs feel really tired all the time and he has some pain in them as well.    No Known Allergies Past Medical History  Diagnosis Date  . Eczema    Current Outpatient Prescriptions on File Prior to Visit  Medication Sig Dispense Refill  . furosemide (LASIX) 40 MG tablet Take 1 tablet (40 mg total) by mouth 2 (two) times daily.  14 tablet  0  . HYDROcodone-acetaminophen (NORCO/VICODIN) 5-325 MG per tablet Take 1-2 tablets by mouth every 6 (six) hours as needed for severe pain.  10 tablet  0  . ibuprofen (ADVIL,MOTRIN) 200 MG tablet Take 400 mg by mouth every 6 (six) hours as needed for moderate pain.      Marland Kitchen sulfamethoxazole-trimethoprim (BACTRIM DS) 800-160 MG per tablet Take 1 tablet by mouth 2 (two) times daily.  21 tablet  0  . Tetrahydrozoline HCl (VISINE OP) Place 1-2 drops into both eyes 2 (two) times daily as needed (redness).       No current facility-administered medications on file prior to visit.   History reviewed. No pertinent family history. History   Social History   . Marital Status: Single    Spouse Name: N/A    Number of Children: N/A  . Years of Education: N/A   Occupational History  . Not on file.   Social History Main Topics  . Smoking status: Never Smoker   . Smokeless tobacco: Not on file  . Alcohol Use: Yes     Comment: beer everyday  . Drug Use: No  . Sexual Activity: Not on file   Other Topics Concern  . Not on file   Social History Narrative  . No narrative on file    Review of Systems: Constitutional: Negative for fever, chills, diaphoresis, activity change, appetite change and fatigue. RASH generalized HENT: Negative for ear pain, nosebleeds, congestion, facial swelling, rhinorrhea, neck pain, neck stiffness and ear discharge.  Eyes: Negative for pain, discharge, redness, itching and visual disturbance. Respiratory: Negative for cough, choking, chest tightness, shortness of breath, wheezing and stridor.  Cardiovascular: Negative for chest pain, palpitations. + leg swelling. Gastrointestinal: Negative for abdominal distention. Genitourinary: Negative for dysuria, urgency, frequency, hematuria, flank pain, decreased urine volume, difficulty urinating and dyspareunia.  Musculoskeletal: Negative for back pain, joint swelling, arthralgia and gait problem. Neurological: Negative for dizziness, tremors, seizures, syncope, facial asymmetry, speech difficulty, weakness, light-headedness, numbness and headaches.  Hematological: Negative for adenopathy. Does not bruise/bleed easily. Psychiatric/Behavioral: Negative for hallucinations, behavioral problems, confusion, dysphoric mood, decreased concentration and agitation.    Objective:   Filed Vitals:   01/31/14 1039  BP: 118/63  Pulse: 96  Temp: 98 F (36.7 C)  Resp: 20   Physical Exam  Vitals reviewed. Constitutional: He is oriented to person, place, and time.  HENT:  Right Ear: External ear normal.  Left Ear: External ear normal.  Mouth/Throat: Oropharynx is clear and  moist.  Eyes: Conjunctivae and EOM are normal. Pupils are equal, round, and reactive to light.  Neck: Normal range of motion. Neck supple.  Cardiovascular: Normal rate, regular rhythm, normal heart sounds and intact distal pulses.   Pulmonary/Chest: Effort normal and breath sounds normal.  Abdominal: Soft. Bowel sounds are normal. He exhibits no distension. There is tenderness.  Musculoskeletal: Normal range of motion.  Neurological: He is alert and oriented to person, place, and time. He has normal reflexes.  Skin: Skin is warm and dry. Rash (generalized atopic dermatitis) noted.  Psychiatric: He has a normal mood and affect. Thought content normal.     Lab Results  Component Value Date   WBC 9.7 01/29/2014   HGB 13.3 01/29/2014   HCT 39.0 01/29/2014   MCV 90.6 01/29/2014   PLT 363 01/29/2014   Lab Results  Component Value Date   CREATININE 1.30 01/29/2014   BUN 15 01/29/2014   NA 142 01/29/2014   K 4.1 01/29/2014   CL 107 01/29/2014   CO2 21 01/04/2014    No results found for this basename: HGBA1C   Lipid Panel  No results found for this basename: chol, trig, hdl, cholhdl, vldl, ldlcalc       Assessment and plan:   Raymond Stone was seen today for establish care and eczema.  Diagnoses and associated orders for this visit:  Encounter to establish care - POCT glycosylated hemoglobin (Hb A1C) - POCT glucose (manual entry) - COMPLETE METABOLIC PANEL WITH GFR  Atopic dermatitis - hydrOXYzine (VISTARIL) 25 MG capsule; Take 1 capsule (25 mg total) by mouth 2 (two) times daily as needed. - triamcinolone cream (KENALOG) 0.1 %; Apply 1 application topically 2 (two) times daily. - methylPREDNISolone sodium succinate (SOLU-MEDROL) 125 mg/2 mL injection 60 mg; Inject 0.96 mLs (60 mg total) into the muscle once. - Ambulatory referral to Dermatology  Edema - Lower Extremity Venous Duplex Bilateral; Future  Cataract - Ambulatory referral to Ophthalmology   Return in about 3 months  (around 05/03/2014).    Chari Manning, [facility] [phone] 01/31/2014, 11:13 AM

## 2014-02-01 ENCOUNTER — Ambulatory Visit (HOSPITAL_COMMUNITY)
Admission: RE | Admit: 2014-02-01 | Discharge: 2014-02-01 | Disposition: A | Discharge status: Home / Self Care | Payer: No Typology Code available for payment source | Source: Ambulatory Visit | Attending: Internal Medicine | Admitting: Internal Medicine

## 2014-02-01 DIAGNOSIS — R609 Edema, unspecified: Secondary | ICD-10-CM

## 2014-02-01 DIAGNOSIS — M79609 Pain in unspecified limb: Secondary | ICD-10-CM

## 2014-02-01 NOTE — Progress Notes (Signed)
*  PRELIMINARY RESULTS* Vascular Ultrasound Lower extremity venous duplex has been completed.  Preliminary findings: no evidence of DVT bilaterally. Baker's cyst noted on the left.  Attempted call report. Left voice message. Let patient leave.  Landry Mellow, RDMS, RVT  02/01/2014, 12:33 PM

## 2014-02-05 ENCOUNTER — Telehealth: Payer: Self-pay | Admitting: *Deleted

## 2014-02-05 NOTE — Telephone Encounter (Signed)
Message copied by Velora Heckler on Mon Feb 05, 2014  1:20 PM ------      Message from: Chari Manning A      Created: Thu Feb 01, 2014  7:05 PM       Doppler study was normal. No blood clots found ------

## 2014-02-05 NOTE — Telephone Encounter (Signed)
Left message on patient's mobile VM to return call to discuss test results.

## 2014-02-08 ENCOUNTER — Telehealth: Payer: Self-pay | Admitting: Internal Medicine

## 2014-02-08 NOTE — Telephone Encounter (Signed)
Pt has come in today to request a visit with Mateo Flow about his eczema; pt has visibly shown deep cuts on his palms; please f/u with pt about what he should do;

## 2014-02-08 NOTE — Telephone Encounter (Signed)
Returned patients call. Patient has an appointment with Roney Jaffe, NP 02/12/2014. Informed patient to continue to use the Kenalog cream twice a day. Patient also informed that he has a Dermatology appointment on 02/23/2013 at 0900 at [facility]. Provided patient the address and phone number to [facility]. Patient inquired about his lab results. Informed patient that his labs has not been reviewed by PCP. Patient states he will talk with Roney Jaffe, NP about his lab results on Monday 02/12/2014. Vivia Birmingham, RN

## 2014-02-09 ENCOUNTER — Other Ambulatory Visit: Payer: Self-pay | Admitting: Internal Medicine

## 2014-02-12 ENCOUNTER — Ambulatory Visit: Payer: No Typology Code available for payment source | Attending: Internal Medicine | Admitting: Internal Medicine

## 2014-02-12 ENCOUNTER — Encounter: Payer: Self-pay | Admitting: Internal Medicine

## 2014-02-12 ENCOUNTER — Telehealth: Payer: Self-pay | Admitting: *Deleted

## 2014-02-12 VITALS — BP 107/69 | HR 82 | Temp 97.5°F | Resp 16 | Ht 70.0 in | Wt 201.2 lb

## 2014-02-12 DIAGNOSIS — L2089 Other atopic dermatitis: Secondary | ICD-10-CM | POA: Insufficient documentation

## 2014-02-12 DIAGNOSIS — L209 Atopic dermatitis, unspecified: Secondary | ICD-10-CM

## 2014-02-12 NOTE — Telephone Encounter (Signed)
Patient notified of lab results and instructions at today's visit. States he will avoid ibuprofen and stay hydrated. States he will get MVI with calcium. Per provider patient has made lab appt for month to recheck CMP.

## 2014-02-12 NOTE — Telephone Encounter (Signed)
Message copied by Velora Heckler on Mon Feb 12, 2014  1:27 PM ------      Message from: Chari Manning A      Created: Fri Feb 09, 2014 10:06 AM       Please educate patient that his kidney function is slightly elevated.  Let patient know to try to avoid ibuprofen if possible and to stay hydrated. Advised patient also to get some over-the-counter multivitamin with calcium, calcium levels are slightly low ------

## 2014-02-12 NOTE — Progress Notes (Signed)
Patient presents for f/u on eczema States hands have open places from eczema that cause him pain; rates 7/10 at present Has appt with dermatologist on 02/23/14 States started breaking out with "bumps" on face, shoulders and back 8 days ago States he ran out of lasix 3-4 days ago. Has lost 23 pounds since 01/04/14.

## 2014-02-18 NOTE — Progress Notes (Signed)
Patient ID: Raymond Stone, male   DOB: [birth year], 63 y.o.   MRN: [id number]  CC: eczema  HPI:  Patient presents to clinic today for re-evaluation of eczema.  Patient was seen her over one week ago and was given steroid injection, kenalog cream, and vistaril for atopic dermatitis.  He reports some improvement of skin on arms and legs but now presents with hives on upper back and shoulders.  He states that he has been itchy but the hives only appeared on his shoulders and back.  He denies any new products or medications.  He states that he has not been exposed to anything new.  No Known Allergies Past Medical History  Diagnosis Date  . Eczema    Current Outpatient Prescriptions on File Prior to Visit  Medication Sig Dispense Refill  . HYDROcodone-acetaminophen (NORCO/VICODIN) 5-325 MG per tablet Take 1-2 tablets by mouth every 6 (six) hours as needed for severe pain.  10 tablet  0  . hydrocortisone cream 1 % Apply 1 application topically 2 (two) times daily.      . hydrOXYzine (VISTARIL) 25 MG capsule Take 1 capsule (25 mg total) by mouth 2 (two) times daily as needed.  30 capsule  0  . triamcinolone cream (KENALOG) 0.1 % Apply 1 application topically 2 (two) times daily.  85.2 g  2  . furosemide (LASIX) 40 MG tablet Take 1 tablet (40 mg total) by mouth 2 (two) times daily.  14 tablet  0  . ibuprofen (ADVIL,MOTRIN) 200 MG tablet Take 400 mg by mouth every 6 (six) hours as needed for moderate pain.      Marland Kitchen sulfamethoxazole-trimethoprim (BACTRIM DS) 800-160 MG per tablet Take 1 tablet by mouth 2 (two) times daily.  21 tablet  0  . Tetrahydrozoline HCl (VISINE OP) Place 1-2 drops into both eyes 2 (two) times daily as needed (redness).       No current facility-administered medications on file prior to visit.   History reviewed. No pertinent family history. History   Social History  . Marital Status: Single    Spouse Name: N/A    Number of Children: N/A  . Years of Education: N/A    Occupational History  . Not on file.   Social History Main Topics  . Smoking status: Never Smoker   . Smokeless tobacco: Not on file  . Alcohol Use: Yes     Comment: beer everyday  . Drug Use: No  . Sexual Activity: Not on file   Other Topics Concern  . Not on file   Social History Narrative  . No narrative on file   Review of Systems  Constitutional: Negative for fever, chills and weight loss.  Skin: Positive for itching and rash.      Objective:   Filed Vitals:   02/12/14 1212  BP: 107/69  Pulse: 82  Temp: 97.5 F (36.4 C)  Resp: 16   Physical Exam  Cardiovascular: Normal rate, regular rhythm and normal heart sounds.   Pulmonary/Chest: Effort normal and breath sounds normal.  Skin: Skin is dry. Rash noted. There is erythema.     Hives    .  Lab Results  Component Value Date   WBC 9.7 01/29/2014   HGB 13.3 01/29/2014   HCT 39.0 01/29/2014   MCV 90.6 01/29/2014   PLT 363 01/29/2014   Lab Results  Component Value Date   CREATININE 1.43* 01/31/2014   BUN 14 01/31/2014   NA 141 01/31/2014   K 4.2 01/31/2014  CL 103 01/31/2014   CO2 28 01/31/2014    Lab Results  Component Value Date   HGBA1C 5.8 01/31/2014   Lipid Panel  No results found for this basename: chol, trig, hdl, cholhdl, vldl, ldlcalc       Assessment and plan:   Raymond Stone was seen today for follow-up and eczema.  Diagnoses and associated orders for this visit:  Atopic dermatitis Patient given samples of prednisone and Claritin.  Patient reports that he is unable to afford additional medications at this time.  He was stressed to make dermatology appointment for further management.    RTC if symptoms worsen or fail to improve      Chari Manning, [place] [facility] [phone] 02/18/2014, 9:48 PM

## 2014-03-15 ENCOUNTER — Other Ambulatory Visit: Payer: Self-pay

## 2014-04-16 ENCOUNTER — Encounter: Payer: Self-pay | Admitting: Internal Medicine

## 2014-04-16 ENCOUNTER — Ambulatory Visit: Payer: No Typology Code available for payment source | Attending: Internal Medicine | Admitting: Internal Medicine

## 2014-04-16 VITALS — BP 124/79 | HR 104 | Temp 98.5°F | Resp 18 | Ht 70.0 in | Wt 217.0 lb

## 2014-04-16 DIAGNOSIS — Z79899 Other long term (current) drug therapy: Secondary | ICD-10-CM | POA: Diagnosis not present

## 2014-04-16 DIAGNOSIS — L309 Dermatitis, unspecified: Secondary | ICD-10-CM | POA: Insufficient documentation

## 2014-04-16 MED ORDER — HYDROXYZINE PAMOATE 50 MG PO CAPS: 50.0000 mg | ORAL_CAPSULE | Freq: Three times a day (TID) | ORAL | Status: DC | PRN

## 2014-04-16 MED ORDER — METHYLPREDNISOLONE SODIUM SUCC 125 MG IJ SOLR
80.0000 mg | Freq: Once | INTRAMUSCULAR | Status: AC
  Administered 2014-04-16: 80 mg via INTRAMUSCULAR

## 2014-04-16 MED ORDER — TRIAMCINOLONE ACETONIDE 0.1 % EX CREA: 1.0000 "application " | TOPICAL_CREAM | Freq: Two times a day (BID) | CUTANEOUS | Status: DC

## 2014-04-16 NOTE — Patient Instructions (Signed)
May use 50 mg of Hydroxyzine for itch.  If you are unable to get medication today, may use up to 50 mg of Benadryl for itch.    Please schedule your appointment with Dermatology---[facility]  [phone]  Eye appointment with [facility] phone # is [phone]

## 2014-04-16 NOTE — Progress Notes (Signed)
Pt comes in to f/u severe Eczema all over with c/o itchiness/hives after using prescribed Triamcinolone cream Denies oozing or pain Declined flu vaccine

## 2014-04-16 NOTE — Progress Notes (Signed)
Patient ID: Raymond Stone, male   DOB: 01/26/1951, 63 y.o.   MRN: [id number]  CC: Eczema  HPI:  Patient presents to clinic today for a follow up of eczema.  Patient was referred to dermatology back in July.  He states that the dermatologist gave him a antibiotic and steroids for a flare up back in August.  He reports that the medication helped to clear the bumps on his skin.  He believes he was told to stop using the triamcinolone cream for a while because it was very strong.  He reports that he has not use the triamcinolone cream for past two weeks.  He states that the hydroxyzine did not help with itching and would like something stronger.  No Known Allergies Past Medical History  Diagnosis Date  . Eczema    Current Outpatient Prescriptions on File Prior to Visit  Medication Sig Dispense Refill  . furosemide (LASIX) 40 MG tablet Take 1 tablet (40 mg total) by mouth 2 (two) times daily.  14 tablet  0  . HYDROcodone-acetaminophen (NORCO/VICODIN) 5-325 MG per tablet Take 1-2 tablets by mouth every 6 (six) hours as needed for severe pain.  10 tablet  0  . hydrocortisone cream 1 % Apply 1 application topically 2 (two) times daily.      . hydrOXYzine (VISTARIL) 25 MG capsule Take 1 capsule (25 mg total) by mouth 2 (two) times daily as needed.  30 capsule  0  . ibuprofen (ADVIL,MOTRIN) 200 MG tablet Take 400 mg by mouth every 6 (six) hours as needed for moderate pain.      Marland Kitchen sulfamethoxazole-trimethoprim (BACTRIM DS) 800-160 MG per tablet Take 1 tablet by mouth 2 (two) times daily.  21 tablet  0  . Tetrahydrozoline HCl (VISINE OP) Place 1-2 drops into both eyes 2 (two) times daily as needed (redness).      . triamcinolone cream (KENALOG) 0.1 % Apply 1 application topically 2 (two) times daily.  85.2 g  2   No current facility-administered medications on file prior to visit.   No family history on file. History   Social History  . Marital Status: Single    Spouse Name: N/A    Number of  Children: N/A  . Years of Education: N/A   Occupational History  . Not on file.   Social History Main Topics  . Smoking status: Never Smoker   . Smokeless tobacco: Not on file  . Alcohol Use: Yes     Comment: beer everyday  . Drug Use: No  . Sexual Activity: Not on file   Other Topics Concern  . Not on file   Social History Narrative  . No narrative on file    Review of Systems  Constitutional: Negative for fever and chills.  Skin: Positive for itching and rash.  Neurological: Negative for tingling.       Objective:   Filed Vitals:   04/16/14 1158  BP: 124/79  Pulse: 104  Temp: 98.5 F (36.9 C)  Resp: 18    Physical Exam  Constitutional: He is oriented to person, place, and time.  Cardiovascular: Normal rate, regular rhythm and normal heart sounds.   Pulmonary/Chest: Effort normal and breath sounds normal.  Abdominal: Soft. Bowel sounds are normal.  Neurological: He is alert and oriented to person, place, and time.  Skin: Skin is warm and dry.  Extremely thick leathery skin throughout body Some skin cracking on upper back noted Raised bumps present on bilateral upper extremities  Lab Results  Component Value Date   WBC 9.7 01/29/2014   HGB 13.3 01/29/2014   HCT 39.0 01/29/2014   MCV 90.6 01/29/2014   PLT 363 01/29/2014   Lab Results  Component Value Date   CREATININE 1.43* 01/31/2014   BUN 14 01/31/2014   NA 141 01/31/2014   K 4.2 01/31/2014   CL 103 01/31/2014   CO2 28 01/31/2014    Lab Results  Component Value Date   HGBA1C 5.8 01/31/2014   Lipid Panel  No results found for this basename: chol, trig, hdl, cholhdl, vldl, ldlcalc       Assessment and plan:   Marcellas was seen today for follow-up.  Diagnoses and associated orders for this visit:  Eczema - hydrOXYzine (VISTARIL) 50 MG capsule; Take 1 capsule (50 mg total) by mouth every 8 (eight) hours as needed. - triamcinolone cream (KENALOG) 0.1 %; Apply 1 application topically 2 (two)  times daily. - methylPREDNISolone sodium succinate (SOLU-MEDROL) 125 mg/2 mL injection 80 mg; Inject 1.28 mLs (80 mg total) into the muscle once. Urged patient to make a follow up with Dermatology so they may prescribe something better.  Educated patient to take short luke warm baths and pat dry when done. Explained that he should use a thick mositurizer such as aquaphor or eucerin right after shower.   Return in about 3 months (around 07/17/2014).       Chari Manning, [facility] [phone] 04/16/2014, 11:58 AM

## 2014-05-16 ENCOUNTER — Telehealth: Payer: Self-pay | Admitting: Internal Medicine

## 2014-05-16 NOTE — Telephone Encounter (Signed)
Pt. Called stating that he has not seen his dermatologist, pt. Was told that he needed to see the dermatologist before he sees his PCP in order for the PCP to be able to treat him properly. Pt. Would like to know if he needs to keep this appt or resch. Please f/u with pt.

## 2014-05-17 ENCOUNTER — Ambulatory Visit: Payer: Self-pay | Admitting: Internal Medicine

## 2014-05-21 ENCOUNTER — Ambulatory Visit: Payer: Self-pay

## 2014-06-20 ENCOUNTER — Ambulatory Visit: Payer: No Typology Code available for payment source | Attending: Internal Medicine | Admitting: Internal Medicine

## 2014-06-20 ENCOUNTER — Encounter: Payer: Self-pay | Admitting: Internal Medicine

## 2014-06-20 VITALS — BP 124/80 | HR 98 | Temp 98.3°F | Resp 16 | Ht 70.0 in | Wt 207.0 lb

## 2014-06-20 DIAGNOSIS — L309 Dermatitis, unspecified: Secondary | ICD-10-CM | POA: Insufficient documentation

## 2014-06-20 DIAGNOSIS — R21 Rash and other nonspecific skin eruption: Secondary | ICD-10-CM

## 2014-06-20 DIAGNOSIS — Z79899 Other long term (current) drug therapy: Secondary | ICD-10-CM | POA: Insufficient documentation

## 2014-06-20 NOTE — Progress Notes (Signed)
Patient ID: Raymond Stone, male   DOB: February 10, 1951, 63 y.o.   MRN: [id number]  CC: eczema  HPI:  Patient presents to clinic today for a follow up of eczema.  Patient states that for the past few days he has had a repeat flare of his eczema.  He reports that he still has the triamcinolone cream but has not been using since the last visit.  He states that he does not have enough money for the copay to go see Dermatology.  He states that he just wants to make sure it is ok to use the triamcinolone cream again.  No Known Allergies Past Medical History  Diagnosis Date  . Eczema    Current Outpatient Prescriptions on File Prior to Visit  Medication Sig Dispense Refill  . triamcinolone cream (KENALOG) 0.1 % Apply 1 application topically 2 (two) times daily. 85.2 g 2  . furosemide (LASIX) 40 MG tablet Take 1 tablet (40 mg total) by mouth 2 (two) times daily. (Patient not taking: Reported on 06/20/2014) 14 tablet 0  . hydrOXYzine (VISTARIL) 50 MG capsule Take 1 capsule (50 mg total) by mouth every 8 (eight) hours as needed. (Patient not taking: Reported on 06/20/2014) 30 capsule 1  . Tetrahydrozoline HCl (VISINE OP) Place 1-2 drops into both eyes 2 (two) times daily as needed (redness).     No current facility-administered medications on file prior to visit.   History reviewed. No pertinent family history. History   Social History  . Marital Status: Single    Spouse Name: N/A    Number of Children: N/A  . Years of Education: N/A   Occupational History  . Not on file.   Social History Main Topics  . Smoking status: Never Smoker   . Smokeless tobacco: Not on file  . Alcohol Use: Yes     Comment: beer everyday  . Drug Use: No  . Sexual Activity: Not on file   Other Topics Concern  . Not on file   Social History Narrative    Review of Systems  Skin: Positive for itching and rash.  All other systems reviewed and are negative.     Objective:   Filed Vitals:   06/20/14 1424  BP:  124/80  Pulse: 98  Temp: 98.3 F (36.8 C)  Resp: 16    Physical Exam  Cardiovascular: Normal rate, regular rhythm and normal heart sounds.   Pulmonary/Chest: Effort normal and breath sounds normal.  Skin: Rash noted.  Scattered on BUE, open dry cracked skin Very tight and dry     Lab Results  Component Value Date   WBC 9.7 01/29/2014   HGB 13.3 01/29/2014   HCT 39.0 01/29/2014   MCV 90.6 01/29/2014   PLT 363 01/29/2014   Lab Results  Component Value Date   CREATININE 1.43* 01/31/2014   BUN 14 01/31/2014   NA 141 01/31/2014   K 4.2 01/31/2014   CL 103 01/31/2014   CO2 28 01/31/2014    Lab Results  Component Value Date   HGBA1C 5.8 01/31/2014   Lipid Panel  No results found for: CHOL, TRIG, HDL, CHOLHDL, VLDL, LDLCALC     Assessment and plan:   Leny was seen today for follow-up.  Diagnoses and associated orders for this visit:  Rash and nonspecific skin eruption Patient may continue to use triamcinolone cream, highly stressed again to patient the need for a dermatology appointment. Explained to patient that it will be in his best interest to seek further  instructions from a dermatologist.  Return for F/u with Dermatology.       Chari Manning, NP-C [facility] [phone] 06/20/2014, 2:47 PM

## 2014-06-20 NOTE — Patient Instructions (Signed)
Eczema Eczema, also called atopic dermatitis, is a skin disorder that causes inflammation of the skin. It causes a red rash and dry, scaly skin. The skin becomes very itchy. Eczema is generally worse during the cooler winter months and often improves with the warmth of summer. Eczema usually starts showing signs in infancy. Some children outgrow eczema, but it may last through adulthood.  CAUSES  The exact cause of eczema is not known, but it appears to run in families. People with eczema often have a family history of eczema, allergies, asthma, or hay fever. Eczema is not contagious. Flare-ups of the condition may be caused by:   Contact with something you are sensitive or allergic to.   Stress. SIGNS AND SYMPTOMS  Dry, scaly skin.   Red, itchy rash.   Itchiness. This may occur before the skin rash and may be very intense.  DIAGNOSIS  The diagnosis of eczema is usually made based on symptoms and medical history. TREATMENT  Eczema cannot be cured, but symptoms usually can be controlled with treatment and other strategies. A treatment plan might include:  Controlling the itching and scratching.   Use over-the-counter antihistamines as directed for itching. This is especially useful at night when the itching tends to be worse.   Use over-the-counter steroid creams as directed for itching.   Avoid scratching. Scratching makes the rash and itching worse. It may also result in a skin infection (impetigo) due to a break in the skin caused by scratching.   Keeping the skin well moisturized with creams every day. This will seal in moisture and help prevent dryness. Lotions that contain alcohol and water should be avoided because they can dry the skin.   Limiting exposure to things that you are sensitive or allergic to (allergens).   Recognizing situations that cause stress.   Developing a plan to manage stress.  HOME CARE INSTRUCTIONS   Only take over-the-counter or  prescription medicines as directed by your health care provider.   Do not use anything on the skin without checking with your health care provider.   Keep baths or showers short (5 minutes) in warm (not hot) water. Use mild cleansers for bathing. These should be unscented. You may add nonperfumed bath oil to the bath water. It is best to avoid soap and bubble bath.   Immediately after a bath or shower, when the skin is still damp, apply a moisturizing ointment to the entire body. This ointment should be a petroleum ointment. This will seal in moisture and help prevent dryness. The thicker the ointment, the better. These should be unscented.   Keep fingernails cut short. Children with eczema may need to wear soft gloves or mittens at night after applying an ointment.   Dress in clothes made of cotton or cotton blends. Dress lightly, because heat increases itching.   A child with eczema should stay away from anyone with fever blisters or cold sores. The virus that causes fever blisters (herpes simplex) can cause a serious skin infection in children with eczema. SEEK MEDICAL CARE IF:   Your itching interferes with sleep.   Your rash gets worse or is not better within 1 week after starting treatment.   You see pus or soft yellow scabs in the rash area.   You have a fever.   You have a rash flare-up after contact with someone who has fever blisters.  Document Released: 06/26/2000 Document Revised: 04/19/2013 Document Reviewed: 01/30/2013 Exit[employer] Patient Information 2015 [employer]. This information   is not intended to replace advice given to you by your health care provider. Make sure you discuss any questions you have with your health care provider.  

## 2014-06-20 NOTE — Progress Notes (Signed)
Pt is here he is broke out with eczema all over his body.

## 2014-09-17 ENCOUNTER — Other Ambulatory Visit: Payer: Self-pay | Admitting: Internal Medicine

## 2015-04-23 ENCOUNTER — Other Ambulatory Visit: Payer: Self-pay | Admitting: Internal Medicine

## 2015-04-24 ENCOUNTER — Telehealth: Payer: Self-pay

## 2015-04-24 ENCOUNTER — Other Ambulatory Visit: Payer: Self-pay

## 2015-04-24 ENCOUNTER — Other Ambulatory Visit: Payer: Self-pay | Admitting: Pharmacist

## 2015-04-24 MED ORDER — CLOBETASOL PROPIONATE 0.05 % EX CREA: 1.0000 "application " | TOPICAL_CREAM | Freq: Two times a day (BID) | CUTANEOUS | Status: DC

## 2015-04-24 MED ORDER — CLOBETASOL PROPIONATE 0.05 % EX GEL: 1.0000 "application " | Freq: Two times a day (BID) | CUTANEOUS | Status: DC

## 2015-04-24 NOTE — Telephone Encounter (Signed)
Patient called requesting a refill on his kenalog cream Patient has not been seen since 06/2014 Are you willing to refill this?

## 2015-04-24 NOTE — Telephone Encounter (Signed)
Returned call to patient to make him aware a new prescription for  [facility]

## 2015-04-24 NOTE — Telephone Encounter (Signed)
Pt came in today and stated that when he went to the pharmacy to pick this prescription up and he was directed to pick it up from the front desk. I informed him that this type of prescription would not be left at the front desk, I double checked anyway, and it wasn't there. I encouraged him to double check with the pharmacy. He stated that the line was too long and that he would call tomorrow to check on the status. Sadie Reynolds, ASA

## 2015-04-25 ENCOUNTER — Other Ambulatory Visit: Payer: Self-pay | Admitting: Pharmacist

## 2015-04-25 MED ORDER — CLOBETASOL PROPIONATE 0.05 % EX GEL: 1.0000 "application " | Freq: Two times a day (BID) | CUTANEOUS | Status: DC

## 2015-05-22 ENCOUNTER — Telehealth: Payer: Self-pay

## 2015-05-22 MED ORDER — TRIAMCINOLONE ACETONIDE 0.1 % EX CREA: TOPICAL_CREAM | CUTANEOUS | Status: DC

## 2015-05-22 NOTE — Telephone Encounter (Signed)
Patient called requesting  A refill on his tricimalone cream RX [facility]

## 2015-07-23 MED FILL — TRIAMCINOLONE 0.1% CREAM: 0.1 | 30 days supply | Qty: 454 | Fill #1

## 2015-11-17 ENCOUNTER — Emergency Department (HOSPITAL_COMMUNITY)
Admission: EM | Admit: 2015-11-17 | Discharge: 2015-11-17 | Disposition: A | Discharge status: Home / Self Care | Payer: No Typology Code available for payment source | Attending: Emergency Medicine | Admitting: Emergency Medicine

## 2015-11-17 DIAGNOSIS — Z7952 Long term (current) use of systemic steroids: Secondary | ICD-10-CM | POA: Insufficient documentation

## 2015-11-17 DIAGNOSIS — L309 Dermatitis, unspecified: Secondary | ICD-10-CM | POA: Insufficient documentation

## 2015-11-17 DIAGNOSIS — R5383 Other fatigue: Secondary | ICD-10-CM | POA: Insufficient documentation

## 2015-11-17 DIAGNOSIS — R634 Abnormal weight loss: Secondary | ICD-10-CM | POA: Insufficient documentation

## 2015-11-17 MED ORDER — PREDNISONE 20 MG PO TABS: ORAL_TABLET | ORAL | Status: DC

## 2015-11-17 MED ORDER — HYDROXYZINE HCL 10 MG PO TABS: 10.0000 mg | ORAL_TABLET | Freq: Once | ORAL | Status: DC

## 2015-11-17 MED ORDER — HYDROXYZINE HCL 25 MG PO TABS
25.0000 mg | ORAL_TABLET | Freq: Once | ORAL | Status: AC
  Administered 2015-11-17: 25 mg via ORAL
  Filled 2015-11-17: qty 1

## 2015-11-17 MED ORDER — TRIAMCINOLONE ACETONIDE 0.1 % EX CREA: TOPICAL_CREAM | CUTANEOUS | Status: DC

## 2015-11-17 MED ORDER — PREDNISONE 20 MG PO TABS
60.0000 mg | ORAL_TABLET | Freq: Once | ORAL | Status: AC
  Administered 2015-11-17: 60 mg via ORAL
  Filled 2015-11-17: qty 3

## 2015-11-17 NOTE — ED Notes (Signed)
Pt presents with diffuse, dry, cracked skin for "a while". Pt is out of Kenalog Cream. Pt denies fever

## 2015-11-17 NOTE — ED Provider Notes (Signed)
CSN: [id number]     Arrival date & time 11/17/15  2045 History    By signing my name below, I, Forrestine Him, attest that this documentation has been prepared under the direction and in the presence of Gay Filler, PA-C.  Electronically Signed: Forrestine Him, ED Scribe. 11/17/2015. 9:02 PM.   Chief Complaint  Patient presents with  . Dry/Cracked skin    The history is provided by the patient. No language interpreter was used.    HPI Comments: Raymond Stone brought in by EMS is a 65 y.o. male with a PMHx of eczema who presents to the Emergency Department complaining of constant, ongoing pruritis, dry and cracked skin to the whole body. States chronic in nature that started about 2 years ago, initially on his left arm and spread to his entire body. He was seen by dermatology, Dr. Allyson Sabal, when the rash covered his entire body and was diagnosed with eczema. He is out of his topical Kenalog cream and the eczema has worsened in the last 24 hours. Pt states symptoms are exacerbated with heat and after bathing; no alleviating factors at this time. Pt also reports generalized weakness and some weight loss. Topical Kenalog for skin with mild temporary improvement. No recent fever, chills, nausea, vomiting, abdominal pain, shortness of breath, cough, or chest pain. No noted open wounds. No focal sores or pain. He denies any illicit drug use but admits to occasional alcohol consumption. He has not followed up with dermatology secondary to transportation concerns.    PCP: Lance Bosch, NP    Past Medical History  Diagnosis Date  . Eczema    Past Surgical History  Procedure Laterality Date  . I&d extremity Right 09/13/2012    Procedure: IRRIGATION AND DEBRIDEMENT EXTREMITY  RIGHT MIDDLE FINGER WITH REVISION AMPUTATION AND SKIN GRAFTING.;  Surgeon: Roseanne Kaufman, MD;  Location: [place];  Service: Orthopedics;  Laterality: Right;   No family history on file. Social History  Substance Use Topics  . Smoking  status: Never Smoker   . Smokeless tobacco: Not on file  . Alcohol Use: Yes     Comment: beer everyday    Review of Systems  Constitutional: Positive for fatigue and unexpected weight change. Negative for fever and chills.  HENT: Negative for facial swelling, mouth sores and trouble swallowing.   Respiratory: Negative for shortness of breath.   Cardiovascular: Negative for chest pain.  Gastrointestinal: Negative for nausea, vomiting and abdominal pain.  Skin: Positive for rash.       Dry cracked skin   Allergic/Immunologic: Negative for immunocompromised state.  Neurological: Negative for headaches.  Psychiatric/Behavioral: Negative for confusion.      Allergies  Review of patient's allergies indicates no known allergies.  Home Medications   Prior to Admission medications   Medication Sig Start Date End Date Taking? Authorizing Provider  clobetasol (TEMOVATE) 0.05 % GEL Apply 1 application topically 2 (two) times daily. 04/25/15   Lance Bosch, NP  furosemide (LASIX) 40 MG tablet Take 1 tablet (40 mg total) by mouth 2 (two) times daily. Patient not taking: Reported on 06/20/2014 01/29/14   Baron Sane, PA-C  hydrOXYzine (VISTARIL) 50 MG capsule Take 1 capsule (50 mg total) by mouth every 8 (eight) hours as needed. Patient not taking: Reported on 06/20/2014 04/16/14   Lance Bosch, NP  predniSONE (DELTASONE) 20 MG tablet 3 tabs po daily x 3 days, then 2 tabs x 3 days, then 1.5 tabs x 3 days, then 1 tab  x 3 days, then 0.5 tabs x 3 days 11/17/15   Clayton Bibles, PA-C  Tetrahydrozoline HCl (VISINE OP) Place 1-2 drops into both eyes 2 (two) times daily as needed (redness).    Historical Provider, MD  triamcinolone cream (KENALOG) 0.1 % APPLY A THIN LAYER 2 TIMES DAILY 11/17/15   Roxanna Mew, PA-C   Triage Vitals: BP 135/78 mmHg  Pulse 109  Temp(Src) 98.5 F (36.9 C) (Oral)  Resp 20  SpO2 99%   Physical Exam  Constitutional: He appears well-developed and  well-nourished. No distress.  HENT:  Head: Normocephalic and atraumatic.  Mouth/Throat: Oropharynx is clear and moist. No oropharyngeal exudate.  Eyes: Conjunctivae are normal. Pupils are equal, round, and reactive to light. No scleral icterus.  Neck: Normal range of motion. Neck supple.  Cardiovascular: Normal rate.   Pulmonary/Chest: Effort normal. No respiratory distress.  Musculoskeletal: Normal range of motion.  Lymphadenopathy:    He has no cervical adenopathy.  Neurological: He is alert. Coordination normal.  Skin: Skin is dry. He is not diaphoretic.  Diffuse dry, scaly, flaky skin with intermittent breaks in skin all over body. No erythema, edema, warmth, or discharge.   Psychiatric: He has a normal mood and affect. His behavior is normal.  Nursing note and vitals reviewed.   ED Course  Procedures (including critical care time)  DIAGNOSTIC STUDIES: Oxygen Saturation is 99% on RA, Normal by my interpretation.    COORDINATION OF CARE: 8:51 PM-Discussed treatment plan with pt at bedside and pt agreed to plan.     Labs Review Labs Reviewed - No data to display  Imaging Review No results found. I have personally reviewed and evaluated these images and lab results as part of my medical decision-making.   EKG Interpretation None      MDM   Final diagnoses:  Eczema    Patient reports to ED with complaint of rash. He was diagnosed two years ago with eczema when he was seen by Dermatology for diffuse rash over his body. He was treated with Kenalog cream, which provided symptomatic relief; however, he is currently out of his kenalog cream and the itching/dryness is worsening. The rash is chronic in nature and unchanged. He denies fever, chills, or nightsweats. No focal pain or open, draining sores. Denies oral swelling, trouble swallowing, or difficulty breathing. He is afebrile. On exam, he has diffuse dry, scaly skin with intermittent breaks. No erythema, warmth, or  discharge is noted. No sore or ulcerations. Doubt an infectious process. Provided topical ointment, vistaril, and prednisone in ED. Prescribed kenalog cream and a prednisone taper. Encouraged follow up with both PCP and dermatology. Discussed return precautions. Patient voiced understanding and is agreeable.   I personally performed the services described in this documentation, which was scribed in my presence. The recorded information has been reviewed and is accurate.   Roxanna Mew, [place] 11/17/15 2152  Lacretia Leigh, MD 11/24/15 [phone]

## 2015-11-17 NOTE — Discharge Instructions (Signed)
Read the information below.  Use the prescribed medication as directed.  Please discuss all new medications with your pharmacist.  You may return to the Emergency Department at any time for worsening condition or any new symptoms that concern you.  Be sure to follow up with dermatology and your primary care provider. You can take benadryl for relief of itching. Keep your skin clean and dry. Avoid triggers. Apply provided ointment to create a protective layer for your skin. Return to the ED if you develop fever, chills, profuse sweating in the evening, focal pain, or open sores with discharge.

## 2015-11-17 NOTE — ED Notes (Signed)
Per EMS patient with Hx of Eczema.  Patient has had difficulty with itching since Thursday.  Patient currently out of steroid medication.  Per EMS patient states that heat has made it worse.  VS en route 148/92, PR 120, RR 22.

## 2015-11-19 ENCOUNTER — Other Ambulatory Visit: Payer: Self-pay | Admitting: Internal Medicine

## 2015-11-19 MED FILL — TRIAMCINOLONE 0.1% CREAM: 0.1 | 30 days supply | Qty: 454 | Fill #2

## 2015-12-17 ENCOUNTER — Encounter (HOSPITAL_COMMUNITY): Payer: Self-pay | Admitting: *Deleted

## 2015-12-17 ENCOUNTER — Ambulatory Visit (HOSPITAL_COMMUNITY)
Admission: EM | Admit: 2015-12-17 | Discharge: 2015-12-17 | Disposition: A | Discharge status: Home / Self Care | Payer: No Typology Code available for payment source | Attending: Emergency Medicine | Admitting: Emergency Medicine

## 2015-12-17 DIAGNOSIS — R600 Localized edema: Secondary | ICD-10-CM

## 2015-12-17 DIAGNOSIS — N5089 Other specified disorders of the male genital organs: Secondary | ICD-10-CM

## 2015-12-17 DIAGNOSIS — L309 Dermatitis, unspecified: Secondary | ICD-10-CM

## 2015-12-17 HISTORY — DX: Localized edema: R60.0

## 2015-12-17 LAB — POCT I-STAT, CHEM 8
BUN: 10 mg/dL (ref 6–20)
CALCIUM ION: 1.17 mmol/L (ref 1.13–1.30)
Chloride: 107 mmol/L (ref 101–111)
Creatinine, Ser: 1.2 mg/dL (ref 0.61–1.24)
Glucose, Bld: 95 mg/dL (ref 65–99)
HCT: 31 % — ABNORMAL LOW (ref 39.0–52.0)
Hemoglobin: 10.5 g/dL — ABNORMAL LOW (ref 13.0–17.0)
Potassium: 3.9 mmol/L (ref 3.5–5.1)
Sodium: 144 mmol/L (ref 135–145)
TCO2: 25 mmol/L (ref 0–100)

## 2015-12-17 MED ORDER — CLOBETASOL PROPIONATE 0.05 % EX GEL: 1.0000 "application " | Freq: Two times a day (BID) | CUTANEOUS | Status: DC

## 2015-12-17 MED ORDER — FUROSEMIDE 40 MG PO TABS
40.0000 mg | ORAL_TABLET | Freq: Two times a day (BID) | ORAL | Status: DC
Start: 2015-12-17 — End: 2016-01-07

## 2015-12-17 MED ORDER — TRIAMCINOLONE ACETONIDE 0.1 % EX CREA: TOPICAL_CREAM | CUTANEOUS | Status: DC

## 2015-12-17 MED FILL — ?FUROSEMIDE 40 MG TABLET: 40 | 7 days supply | Qty: 14 | Fill #0

## 2015-12-17 MED FILL — CLOBETASOL 0.05% GEL: 0.05 | 30 days supply | Qty: 60 | Fill #0

## 2015-12-17 MED FILL — TRIAMCINOLONE 0.1% CREAM: 0.1 | 30 days supply | Qty: 454 | Fill #0

## 2015-12-17 NOTE — Discharge Instructions (Signed)
Edema Edema is an abnormal buildup of fluids. It is more common in your legs and thighs. Painless swelling of the feet and ankles is more likely as a person ages. It also is common in looser skin, like around your eyes. HOME CARE   Keep the affected body part above the level of the heart while lying down.  Do not sit still or stand for a long time.  Do not put anything right under your knees when you lie down.  Do not wear tight clothes on your upper legs.  Exercise your legs to help the puffiness (swelling) go down.  Wear elastic bandages or support stockings as told by your doctor.  A low-salt diet may help lessen the puffiness.  Only take medicine as told by your doctor. GET HELP IF:  Treatment is not working.  You have heart, liver, or kidney disease and notice that your skin looks puffy or shiny.  You have puffiness in your legs that does not get better when you raise your legs.  You have sudden weight gain for no reason. GET HELP RIGHT AWAY IF:   You have shortness of breath or chest pain.  You cannot breathe when you lie down.  You have pain, redness, or warmth in the areas that are puffy.  You have heart, liver, or kidney disease and get edema all of a sudden.  You have a fever and your symptoms get worse all of a sudden. MAKE SURE YOU:   Understand these instructions.  Will watch your condition.  Will get help right away if you are not doing well or get worse.   This information is not intended to replace advice given to you by your health care provider. Make sure you discuss any questions you have with your health care provider.   Document Released: 12/16/2007 Document Revised: 07/04/2013 Document Reviewed: 04/21/2013 Elsevier Interactive Patient Education 2016 Elsevier Inc.  Peripheral Edema You have swelling in your legs (peripheral edema). This swelling is due to excess accumulation of salt and water in your body. Edema may be a sign of heart, kidney  or liver disease, or a side effect of a medication. It may also be due to problems in the leg veins. Elevating your legs and using special support stockings may be very helpful, if the cause of the swelling is due to poor venous circulation. Avoid long periods of standing, whatever the cause. Treatment of edema depends on identifying the cause. Chips, pretzels, pickles and other salty foods should be avoided. Restricting salt in your diet is almost always needed. Water pills (diuretics) are often used to remove the excess salt and water from your body via urine. These medicines prevent the kidney from reabsorbing sodium. This increases urine flow. Diuretic treatment may also result in lowering of potassium levels in your body. Potassium supplements may be needed if you have to use diuretics daily. Daily weights can help you keep track of your progress in clearing your edema. You should call your caregiver for follow up care as recommended. SEEK IMMEDIATE MEDICAL CARE IF:   You have increased swelling, pain, redness, or heat in your legs.  You develop shortness of breath, especially when lying down.  You develop chest or abdominal pain, weakness, or fainting.  You have a fever.   This information is not intended to replace advice given to you by your health care provider. Make sure you discuss any questions you have with your health care provider.   Document Released:  08/06/2004 Document Revised: 09/21/2011 Document Reviewed: 01/09/2015 Elsevier Interactive Patient Education [employer].

## 2015-12-17 NOTE — ED Notes (Signed)
Pt has   Eczema      He  Also has    Swelling  Of  extremitys    And  Scrotum            He  Is  No  Compliant  And  Stopped  Going  To his  pcp          He  States  He  Is  On  No  meds

## 2015-12-17 NOTE — ED Provider Notes (Signed)
CSN: [id number]     Arrival date & time 12/17/15  1255 History   First MD Initiated Contact with Patient 12/17/15 1309     Chief Complaint  Patient presents with  . Groin Swelling   (Consider location/radiation/quality/duration/timing/severity/associated sxs/prior Treatment) HPI  The 65yo male presenting to [facility] with c/o bilateral lower leg swelling and intermittent scrotal swelling.  Bilateral leg swelling started about 2 weeks ago, and scrotal swelling started about 1 week ago. Associated cramping and soreness in his legs bilaterally due to the swelling, 5/10.  Swelling in scrotum goes down at night when he lies down but gradually worsens throughout the day.  He notes he had similar swelling about 1 year ago and took Lasix, he does not recall the dose but states it worked, swelling resolved and he has not been on it sense.  He also notes hx of eczema that has gradually worsened over his legs since they started swelling. Small cuts on his skin from skin cracking. He has used olive oil w/o relief.  He called his PCP but she is no longer at the practice so he was scheduled with a new provider June 27th but did not want to wait until then. Denies chest pain or SOB. Denies dysuria or hematuria. Denies pain in scrotum.    Past Medical History  Diagnosis Date  . Eczema   . Fluid collection (edema) in the arms, legs, hands and feet    Past Surgical History  Procedure Laterality Date  . I&d extremity Right 09/13/2012    Procedure: IRRIGATION AND DEBRIDEMENT EXTREMITY  RIGHT MIDDLE FINGER WITH REVISION AMPUTATION AND SKIN GRAFTING.;  Surgeon: Roseanne Kaufman, MD;  Location: [facility];  Service: Orthopedics;  Laterality: Right;   History reviewed. No pertinent family history. Social History  Substance Use Topics  . Smoking status: Never Smoker   . Smokeless tobacco: None  . Alcohol Use: Yes     Comment: beer everyday    Review of Systems  Constitutional: Negative for fever, chills and fatigue.   Respiratory: Negative for cough, shortness of breath and wheezing.   Cardiovascular: Positive for leg swelling ( bilateral). Negative for chest pain and palpitations.  Gastrointestinal: Negative for nausea, vomiting, abdominal pain and diarrhea.  Genitourinary: Positive for scrotal swelling. Negative for dysuria, urgency, frequency, hematuria, flank pain, decreased urine volume, discharge, penile swelling, penile pain and testicular pain.  Musculoskeletal: Negative for myalgias, back pain and arthralgias.  Skin: Positive for rash and wound.    Allergies  Review of patient's allergies indicates no known allergies.  Home Medications   Prior to Admission medications   Medication Sig Start Date End Date Taking? Authorizing Provider  clobetasol (TEMOVATE) 0.05 % GEL Apply 1 application topically 2 (two) times daily. 12/17/15   Noland Fordyce, PA-C  furosemide (LASIX) 40 MG tablet Take 1 tablet (40 mg total) by mouth 2 (two) times daily. 12/17/15   Noland Fordyce, PA-C  hydrOXYzine (VISTARIL) 50 MG capsule Take 1 capsule (50 mg total) by mouth every 8 (eight) hours as needed. Patient not taking: Reported on 06/20/2014 04/16/14   Lance Bosch, NP  predniSONE (DELTASONE) 20 MG tablet 3 tabs po daily x 3 days, then 2 tabs x 3 days, then 1.5 tabs x 3 days, then 1 tab x 3 days, then 0.5 tabs x 3 days 11/17/15   Clayton Bibles, PA-C  Tetrahydrozoline HCl (VISINE OP) Place 1-2 drops into both eyes 2 (two) times daily as needed (redness).    Historical Provider,  MD  triamcinolone cream (KENALOG) 0.1 % APPLY A THIN LAYER 2 TIMES DAILY 12/17/15   Noland Fordyce, PA-C   Meds Ordered and Administered this Visit  Medications - No data to display  BP 140/66 mmHg  Pulse 92  Temp(Src) 97.4 F (36.3 C) (Oral)  Resp 16 No data found.   Physical Exam  Constitutional: He appears well-developed and well-nourished.  HENT:  Head: Normocephalic and atraumatic.  Eyes: Conjunctivae are normal. No scleral icterus.  Neck:  Normal range of motion.  Cardiovascular: Normal rate, regular rhythm and normal heart sounds.   Pulmonary/Chest: Effort normal and breath sounds normal. No respiratory distress. He has no wheezes. He has no rales. He exhibits no tenderness.  Abdominal: Soft. He exhibits no distension. There is no tenderness. Hernia confirmed negative in the right inguinal area and confirmed negative in the left inguinal area.  Genitourinary: Right testis shows swelling. Right testis shows no mass and no tenderness. Right testis is descended. Cremasteric reflex is not absent on the right side. Left testis shows swelling. Left testis shows no mass. Left testis is descended. Cremasteric reflex is not absent on the left side. Uncircumcised.  Musculoskeletal: Normal range of motion.  Neurological: He is alert.  Skin: Skin is warm and dry. Rash noted. No erythema.  Bilateral legs: dried rash with several areas of cracked skin due to dryness. No active bleeding. No erythema or warmth.   Nursing note and vitals reviewed.   ED Course  Procedures (including critical care time)  Labs Review Labs Reviewed  POCT I-STAT, CHEM 8 - Abnormal; Notable for the following:    Hemoglobin 10.5 (*)    HCT 31.0 (*)    All other components within normal limits    Imaging Review No results found.    MDM   1. Scrotal swelling   2. Bilateral leg edema   3. Eczema    Swelling appears benign at this time. No cardiopulmonary symptoms. Pt denies tenderness with scrotal swelling.   Chem-8: normal kidney function.  Slight low Hgb/Hct. Pt reports hx of anemia in the past and "takes vitamins"  Denies chest pain, palpitations, dizziness or SOB.  Rx: lasix, clobetasol, and kenalog   Encouraged f/u with PCP, try to get in sooner if symptoms continue to worsen.  Discussed symptoms that warrant emergent care in the ED. Patient verbalized understanding and agreement with treatment plan.   Noland Fordyce, PA-C 12/17/15 1430

## 2016-01-07 ENCOUNTER — Encounter: Payer: Self-pay | Admitting: Internal Medicine

## 2016-01-07 ENCOUNTER — Ambulatory Visit: Payer: Self-pay | Attending: Internal Medicine | Admitting: Internal Medicine

## 2016-01-07 VITALS — BP 160/82 | HR 90 | Temp 97.7°F | Wt 205.2 lb

## 2016-01-07 DIAGNOSIS — I10 Essential (primary) hypertension: Secondary | ICD-10-CM

## 2016-01-07 DIAGNOSIS — N5089 Other specified disorders of the male genital organs: Secondary | ICD-10-CM

## 2016-01-07 DIAGNOSIS — Z79899 Other long term (current) drug therapy: Secondary | ICD-10-CM | POA: Insufficient documentation

## 2016-01-07 DIAGNOSIS — Z13220 Encounter for screening for lipoid disorders: Secondary | ICD-10-CM

## 2016-01-07 DIAGNOSIS — Z131 Encounter for screening for diabetes mellitus: Secondary | ICD-10-CM

## 2016-01-07 DIAGNOSIS — L309 Dermatitis, unspecified: Secondary | ICD-10-CM

## 2016-01-07 DIAGNOSIS — R600 Localized edema: Secondary | ICD-10-CM

## 2016-01-07 LAB — BASIC METABOLIC PANEL WITH GFR
BUN: 8 mg/dL (ref 7–25)
CALCIUM: 8.5 mg/dL — AB (ref 8.6–10.3)
CO2: 23 mmol/L (ref 20–31)
Chloride: 106 mmol/L (ref 98–110)
Creat: 0.91 mg/dL (ref 0.70–1.25)
GFR, EST NON AFRICAN AMERICAN: 89 mL/min (ref >=60)
GFR, Est African American: >89 mL/min (ref >=60)
GLUCOSE: 88 mg/dL (ref 65–99)
Potassium: 3.2 mmol/L — ABNORMAL LOW (ref 3.5–5.3)
SODIUM: 142 mmol/L (ref 135–146)

## 2016-01-07 MED ORDER — FUROSEMIDE 40 MG PO TABS: 40.0000 mg | ORAL_TABLET | Freq: Every day | ORAL | Status: DC

## 2016-01-07 MED ORDER — METOPROLOL TARTRATE 25 MG PO TABS: 25.0000 mg | ORAL_TABLET | Freq: Two times a day (BID) | ORAL | Status: DC

## 2016-01-07 MED FILL — FUROSEMIDE 40 MG TABLET: 40 | 30 days supply | Qty: 30 | Fill #0

## 2016-01-07 MED FILL — ?METOPROLOL 25 MG TABLET: 25 | 30 days supply | Qty: 60 | Fill #0

## 2016-01-07 NOTE — Progress Notes (Signed)
Raymond Stone, is a 65 y.o. male  [id number]  [id number]  DOB - August 21, 1950  CC:  Chief Complaint  Patient presents with  . Establish Care    Re establish care  . Follow-up    ED - Scrotal swelling       HPI: Axil Nard is a 65 y.o. male here today to establish medical care., w/ PMhx of eczema, last seen in clinic 11/16.  He was seen in ED on  12/27/15 for progressive scrotal edema and bilateral le edema.  He took lasix for about 1 wk, noticed some swelling improvement, but now it is back.  Denies cp/palpitations/orthopnea.  Can sleep on his back, but prefers sleeping on side. Of note, he states last 2-3 days while sleeping on side he feels sob.  Denies Pleuritic cp/pnd.  +eats out a lot, fried foods, canned foods (baked beans) occasionally as well.  Denies hx of htn prior.  Patient has No headache, No chest pain, No abdominal pain - No Nausea, No new weakness tingling or numbness, No Cough - SOB.    Review of Systems: Per hpi, o/w all systems reviewed and negative.  No Known Allergies Past Medical History  Diagnosis Date  . Eczema   . Fluid collection (edema) in the arms, legs, hands and feet    Current Outpatient Prescriptions on File Prior to Visit  Medication Sig Dispense Refill  . clobetasol (TEMOVATE) 0.05 % GEL Apply 1 application topically 2 (two) times daily. (Patient not taking: Reported on 01/07/2016) 60 each 0  . hydrOXYzine (VISTARIL) 50 MG capsule Take 1 capsule (50 mg total) by mouth every 8 (eight) hours as needed. (Patient not taking: Reported on 06/20/2014) 30 capsule 1  . predniSONE (DELTASONE) 20 MG tablet 3 tabs po daily x 3 days, then 2 tabs x 3 days, then 1.5 tabs x 3 days, then 1 tab x 3 days, then 0.5 tabs x 3 days (Patient not taking: Reported on 01/07/2016) 27 tablet 0  . Tetrahydrozoline HCl (VISINE OP) Place 1-2 drops into both eyes 2 (two) times daily as needed (redness). Reported on 01/07/2016    . triamcinolone cream (KENALOG) 0.1 %  APPLY A THIN LAYER 2 TIMES DAILY (Patient not taking: Reported on 01/07/2016) 454 g 2   No current facility-administered medications on file prior to visit.   No family history on file. Social History   Social History  . Marital Status: Single    Spouse Name: N/A  . Number of Children: N/A  . Years of Education: N/A   Occupational History  . Not on file.   Social History Main Topics  . Smoking status: Never Smoker   . Smokeless tobacco: Not on file  . Alcohol Use: Yes     Comment: beer everyday  . Drug Use: No  . Sexual Activity: Not on file   Other Topics Concern  . Not on file   Social History Narrative    Objective:   Filed Vitals:   01/07/16 1232  BP: 160/82  Pulse: 90  Temp: 97.7 F (36.5 C)    Filed Weights   01/07/16 1232  Weight: 205 lb 3.2 oz (93.078 kg)    BP Readings from Last 3 Encounters:  01/07/16 160/82  12/17/15 140/66  11/17/15 135/78    Physical Exam: Constitutional: Patient appears well-developed and well-nourished. No distress. AAOx3, pleasant. HENT: Normocephalic, atraumatic, External right and left ear normal. Oropharynx is clear and moist.  Eyes: Conjunctivae and EOM are normal. PERRL,  no scleral icterus. Neck: Normal ROM. Neck supple. No JVD. CVS: RRR, S1/S2 +, no murmurs, no gallops, no carotid bruit.  Pulmonary: Effort and breath sounds normal, no stridor, rhonchi, wheezes, rales.  Abdominal: Soft. BS +, no distension, tenderness, rebound or guarding.  Musculoskeletal: Normal range of motion. No edema and no tenderness.  Scrotum: diffuse edema noted, scrotum estimated size of small grafefruit.  No palpable masses, nttp, no urethra discharge LE: 3+ pitting edema noted to calves/back, pulses 2+ bilateral. Lymphadenopathy: No lymphadenopathy noted, cervical, inguinal. Neuro: Alert. muscle tone coordination wnl. No cranial nerve deficit grossly. Skin: Skin is warm and dry. No rash noted. Not diaphoretic. No erythema. No  pallor. Psychiatric: Normal mood and affect. Behavior, judgment, thought content normal.  Lab Results  Component Value Date   WBC 9.7 01/29/2014   HGB 10.5* 12/17/2015   HCT 31.0* 12/17/2015   MCV 90.6 01/29/2014   PLT 363 01/29/2014   Lab Results  Component Value Date   CREATININE 1.20 12/17/2015   BUN 10 12/17/2015   NA 144 12/17/2015   K 3.9 12/17/2015   CL 107 12/17/2015   CO2 28 01/31/2014    Lab Results  Component Value Date   HGBA1C 5.8 01/31/2014   Lipid Panel  No results found for: CHOL, TRIG, HDL, CHOLHDL, VLDL, LDLCALC     Depression screen [facility] 2/9 01/07/2016 01/31/2014  Decreased Interest 1 2  Down, Depressed, Hopeless 1 3  PHQ - 2 Score 2 5  Altered sleeping 3 3  Tired, decreased energy 2 3  Change in appetite 2 3  Feeling bad or failure about yourself  1 2  Trouble concentrating 1 2  Moving slowly or fidgety/restless 0 1  Suicidal thoughts 0 0  PHQ-9 Score 11 19  Difficult doing work/chores Somewhat difficult -    Assessment and plan:   1. Scrotal edema - may be due to salt intake w/ htn, r/o cardiac cause, pt denies cp or orthopnea, lung exam unremarkable, but significant edema noted on exam - will chk basic labs and bnp, may need echo/cxr. - lasix 40mg  daily for now.  2. Bilateral leg edema See #1 - BASIC METABOLIC PANEL WITH GFR - Brain natriuretic peptide - Microalbumin/Creatinine Ratio, Urine  3. Htn, new, vs elavated BP - eval of past bps have been normal - LOW Salt diet/DASH diet emphasized today - start metoprolol 25 bid  - start lasix 40 qday (given extensive edema); once edema improves, may be able to switch to hctz.   4. Eczema, diffusely Tolerable currently, has not seen derm in long time  5. Diabetes mellitus screening - POCT A1C  6. Cholesterol/lipid screening screening - Lipid Panel   Return in about 2 weeks (around 01/21/2016) for scrotal/leg swelling..  The patient was given clear instructions to go to ER or  return to medical center if symptoms don't improve, worsen or new problems develop. The patient verbalized understanding. The patient was told to call to get lab results if they haven't heard anything in the next week.    This note has been created with Surveyor, quantity. Any transcriptional errors are unintentional.   Maren Reamer, MD, [place], [place]   01/07/2016, 1:25 PM

## 2016-01-07 NOTE — Patient Instructions (Addendum)
Financial aid packet.  DASH Eating Plan DASH stands for "Dietary Approaches to Stop Hypertension." The DASH eating plan is a healthy eating plan that has been shown to reduce high blood pressure (hypertension). Additional health benefits may include reducing the risk of type 2 diabetes mellitus, heart disease, and stroke. The DASH eating plan may also help with weight loss. WHAT DO I NEED TO KNOW ABOUT THE DASH EATING PLAN? For the DASH eating plan, you will follow these general guidelines:  Choose foods with a percent daily value for sodium of less than 5% (as listed on the food label).  Use salt-free seasonings or herbs instead of table salt or sea salt.  Check with your health care provider or pharmacist before using salt substitutes.  Eat lower-sodium products, often labeled as "lower sodium" or "no salt added."  Eat fresh foods.  Eat more vegetables, fruits, and low-fat dairy products.  Choose whole grains. Look for the word "whole" as the first word in the ingredient list.  Choose fish and skinless chicken or [place] more often than red meat. Limit fish, poultry, and meat to 6 oz (170 g) each day.  Limit sweets, desserts, sugars, and sugary drinks.  Choose heart-healthy fats.  Limit cheese to 1 oz (28 g) per day.  Eat more home-cooked food and less restaurant, buffet, and fast food.  Limit fried foods.  Cook foods using methods other than frying.  Limit canned vegetables. If you do use them, rinse them well to decrease the sodium.  When eating at a restaurant, ask that your food be prepared with less salt, or no salt if possible. WHAT FOODS CAN I EAT? Seek help from a dietitian for individual calorie needs. Grains Whole grain or whole wheat bread. Brown rice. Whole grain or whole wheat pasta. Quinoa, bulgur, and whole grain cereals. Low-sodium cereals. Corn or whole wheat flour tortillas. Whole grain cornbread. Whole grain crackers. Low-sodium  crackers. Vegetables Fresh or frozen vegetables (raw, steamed, roasted, or grilled). Low-sodium or reduced-sodium tomato and vegetable juices. Low-sodium or reduced-sodium tomato sauce and paste. Low-sodium or reduced-sodium canned vegetables.  Fruits All fresh, canned (in natural juice), or frozen fruits. Meat and Other Protein Products Ground beef (85% or leaner), grass-fed beef, or beef trimmed of fat. Skinless chicken or [place]. Ground chicken or [place]. Pork trimmed of fat. All fish and seafood. Eggs. Dried beans, peas, or lentils. Unsalted nuts and seeds. Unsalted canned beans. Dairy Low-fat dairy products, such as skim or 1% milk, 2% or reduced-fat cheeses, low-fat ricotta or cottage cheese, or plain low-fat yogurt. Low-sodium or reduced-sodium cheeses. Fats and Oils Tub margarines without trans fats. Light or reduced-fat mayonnaise and salad dressings (reduced sodium). Avocado. Safflower, olive, or canola oils. Natural peanut or almond butter. Other Unsalted popcorn and pretzels. The items listed above may not be a complete list of recommended foods or beverages. Contact your dietitian for more options. WHAT FOODS ARE NOT RECOMMENDED? Grains White bread. White pasta. White rice. Refined cornbread. Bagels and croissants. Crackers that contain trans fat. Vegetables Creamed or fried vegetables. Vegetables in a cheese sauce. Regular canned vegetables. Regular canned tomato sauce and paste. Regular tomato and vegetable juices. Fruits Dried fruits. Canned fruit in light or heavy syrup. Fruit juice. Meat and Other Protein Products Fatty cuts of meat. Ribs, chicken wings, bacon, sausage, bologna, salami, chitterlings, fatback, hot dogs, bratwurst, and packaged luncheon meats. Salted nuts and seeds. Canned beans with salt. Dairy Whole or 2% milk, cream, half-and-half, and cream cheese. Whole-fat  or sweetened yogurt. Full-fat cheeses or blue cheese. Nondairy creamers and whipped toppings.  Processed cheese, cheese spreads, or cheese curds. Condiments Onion and garlic salt, seasoned salt, table salt, and sea salt. Canned and packaged gravies. Worcestershire sauce. Tartar sauce. Barbecue sauce. Teriyaki sauce. Soy sauce, including reduced sodium. Steak sauce. Fish sauce. Oyster sauce. Cocktail sauce. Horseradish. Ketchup and mustard. Meat flavorings and tenderizers. Bouillon cubes. Hot sauce. Tabasco sauce. Marinades. Taco seasonings. Relishes. Fats and Oils Butter, stick margarine, lard, shortening, ghee, and bacon fat. Coconut, palm kernel, or palm oils. Regular salad dressings. Other Pickles and olives. Salted popcorn and pretzels. The items listed above may not be a complete list of foods and beverages to avoid. Contact your dietitian for more information. WHERE CAN I FIND MORE INFORMATION? National Heart, Lung, and Blood Institute: travelstabloid.com   This information is not intended to replace advice given to you by your health care provider. Make sure you discuss any questions you have with your health care provider.   Document Released: 06/18/2011 Document Revised: 07/20/2014 Document Reviewed: 05/03/2013 Elsevier Interactive Patient Education 2016 Elsevier Inc.  - Low-Sodium Eating Plan Sodium raises blood pressure and causes water to be held in the body. Getting less sodium from food will help lower your blood pressure, reduce any swelling, and protect your heart, liver, and kidneys. We get sodium by adding salt (sodium chloride) to food. Most of our sodium comes from canned, boxed, and frozen foods. Restaurant foods, fast foods, and pizza are also very high in sodium. Even if you take medicine to lower your blood pressure or to reduce fluid in your body, getting less sodium from your food is important. WHAT IS MY PLAN? Most people should limit their sodium intake to 2,300 mg a day. Your health care provider recommends that you limit your  sodium intake to __________ a day.  WHAT DO I NEED TO KNOW ABOUT THIS EATING PLAN? For the low-sodium eating plan, you will follow these general guidelines:  Choose foods with a % Daily Value for sodium of less than 5% (as listed on the food label).   Use salt-free seasonings or herbs instead of table salt or sea salt.   Check with your health care provider or pharmacist before using salt substitutes.   Eat fresh foods.  Eat more vegetables and fruits.  Limit canned vegetables. If you do use them, rinse them well to decrease the sodium.   Limit cheese to 1 oz (28 g) per day.   Eat lower-sodium products, often labeled as "lower sodium" or "no salt added."  Avoid foods that contain monosodium glutamate (MSG). MSG is sometimes added to [place] food and some canned foods.  Check food labels (Nutrition Facts labels) on foods to learn how much sodium is in one serving.  Eat more home-cooked food and less restaurant, buffet, and fast food.  When eating at a restaurant, ask that your food be prepared with less salt, or no salt if possible.  HOW DO I READ FOOD LABELS FOR SODIUM INFORMATION? The Nutrition Facts label lists the amount of sodium in one serving of the food. If you eat more than one serving, you must multiply the listed amount of sodium by the number of servings. Food labels may also identify foods as:  Sodium free--Less than 5 mg in a serving.  Very low sodium--35 mg or less in a serving.  Low sodium--140 mg or less in a serving.  Light in sodium--50% less sodium in a serving. For example,  if a food that usually has 300 mg of sodium is changed to become light in sodium, it will have 150 mg of sodium.  Reduced sodium--25% less sodium in a serving. For example, if a food that usually has 400 mg of sodium is changed to reduced sodium, it will have 300 mg of sodium. WHAT FOODS CAN I EAT? Grains Low-sodium cereals, including oats, puffed wheat and rice, and  shredded wheat cereals. Low-sodium crackers. Unsalted rice and pasta. Lower-sodium bread.  Vegetables Frozen or fresh vegetables. Low-sodium or reduced-sodium canned vegetables. Low-sodium or reduced-sodium tomato sauce and paste. Low-sodium or reduced-sodium tomato and vegetable juices.  Fruits Fresh, frozen, and canned fruit. Fruit juice.  Meat and Other Protein Products Low-sodium canned tuna and salmon. Fresh or frozen meat, poultry, seafood, and fish. Lamb. Unsalted nuts. Dried beans, peas, and lentils without added salt. Unsalted canned beans. Homemade soups without salt. Eggs.  Dairy Milk. Soy milk. Ricotta cheese. Low-sodium or reduced-sodium cheeses. Yogurt.  Condiments Fresh and dried herbs and spices. Salt-free seasonings. Onion and garlic powders. Low-sodium varieties of mustard and ketchup. Fresh or refrigerated horseradish. Lemon juice.  Fats and Oils Reduced-sodium salad dressings. Unsalted butter.  Other Unsalted popcorn and pretzels.  The items listed above may not be a complete list of recommended foods or beverages. Contact your dietitian for more options. WHAT FOODS ARE NOT RECOMMENDED? Grains Instant hot cereals. Bread stuffing, pancake, and biscuit mixes. Croutons. Seasoned rice or pasta mixes. Noodle soup cups. Boxed or frozen macaroni and cheese. Self-rising flour. Regular salted crackers. Vegetables Regular canned vegetables. Regular canned tomato sauce and paste. Regular tomato and vegetable juices. Frozen vegetables in sauces. Salted [place] fries. Olives. Angie Fava. Relishes. Sauerkraut. Salsa. Meat and Other Protein Products Salted, canned, smoked, spiced, or pickled meats, seafood, or fish. Bacon, ham, sausage, hot dogs, corned beef, chipped beef, and packaged luncheon meats. Salt pork. Jerky. Pickled herring. Anchovies, regular canned tuna, and sardines. Salted nuts. Dairy Processed cheese and cheese spreads. Cheese curds. Blue cheese and cottage  cheese. Buttermilk.  Condiments Onion and garlic salt, seasoned salt, table salt, and sea salt. Canned and packaged gravies. Worcestershire sauce. Tartar sauce. Barbecue sauce. Teriyaki sauce. Soy sauce, including reduced sodium. Steak sauce. Fish sauce. Oyster sauce. Cocktail sauce. Horseradish that you find on the shelf. Regular ketchup and mustard. Meat flavorings and tenderizers. Bouillon cubes. Hot sauce. Tabasco sauce. Marinades. Taco seasonings. Relishes. Fats and Oils Regular salad dressings. Salted butter. Margarine. Ghee. Bacon fat.  Other Potato and tortilla chips. Corn chips and puffs. Salted popcorn and pretzels. Canned or dried soups. Pizza. Frozen entrees and pot pies.  The items listed above may not be a complete list of foods and beverages to avoid. Contact your dietitian for more information.   This information is not intended to replace advice given to you by your health care provider. Make sure you discuss any questions you have with your health care provider.   Document Released: 12/19/2001 Document Revised: 07/20/2014 Document Reviewed: 05/03/2013 Elsevier Interactive Patient Education [employer].

## 2016-01-08 ENCOUNTER — Other Ambulatory Visit: Payer: Self-pay | Admitting: Internal Medicine

## 2016-01-08 DIAGNOSIS — I509 Heart failure, unspecified: Secondary | ICD-10-CM

## 2016-01-08 LAB — BRAIN NATRIURETIC PEPTIDE: Brain Natriuretic Peptide: 271.4 pg/mL — ABNORMAL HIGH (ref <100)

## 2016-01-08 LAB — MICROALBUMIN / CREATININE URINE RATIO
CREATININE, URINE: 407 mg/dL — AB (ref 20–370)
Microalb Creat Ratio: 8 mcg/mg creat (ref <30)
Microalb, Ur: 3.3 mg/dL — ABNORMAL HIGH

## 2016-01-21 ENCOUNTER — Ambulatory Visit: Payer: Self-pay

## 2016-01-21 ENCOUNTER — Ambulatory Visit: Payer: Self-pay | Attending: Internal Medicine | Admitting: Internal Medicine

## 2016-01-21 ENCOUNTER — Encounter: Payer: Self-pay | Admitting: Internal Medicine

## 2016-01-21 VITALS — BP 132/75 | HR 85 | Temp 98.2°F | Resp 16 | Wt 188.0 lb

## 2016-01-21 DIAGNOSIS — L309 Dermatitis, unspecified: Secondary | ICD-10-CM | POA: Insufficient documentation

## 2016-01-21 DIAGNOSIS — R600 Localized edema: Secondary | ICD-10-CM

## 2016-01-21 DIAGNOSIS — E8770 Fluid overload, unspecified: Secondary | ICD-10-CM | POA: Insufficient documentation

## 2016-01-21 DIAGNOSIS — Z79899 Other long term (current) drug therapy: Secondary | ICD-10-CM | POA: Insufficient documentation

## 2016-01-21 DIAGNOSIS — N5089 Other specified disorders of the male genital organs: Secondary | ICD-10-CM | POA: Insufficient documentation

## 2016-01-21 MED ORDER — HYDROXYZINE PAMOATE 50 MG PO CAPS: 50.0000 mg | ORAL_CAPSULE | Freq: Every evening | ORAL | Status: DC | PRN

## 2016-01-21 NOTE — Patient Instructions (Signed)
DASH Eating Plan  DASH stands for "Dietary Approaches to Stop Hypertension." The DASH eating plan is a healthy eating plan that has been shown to reduce high blood pressure (hypertension). Additional health benefits may include reducing the risk of type 2 diabetes mellitus, heart disease, and stroke. The DASH eating plan may also help with weight loss.  WHAT DO I NEED TO KNOW ABOUT THE DASH EATING PLAN?  For the DASH eating plan, you will follow these general guidelines:  · Choose foods with a percent daily value for sodium of less than 5% (as listed on the food label).  · Use salt-free seasonings or herbs instead of table salt or sea salt.  · Check with your health care provider or pharmacist before using salt substitutes.  · Eat lower-sodium products, often labeled as "lower sodium" or "no salt added."  · Eat fresh foods.  · Eat more vegetables, fruits, and low-fat dairy products.  · Choose whole grains. Look for the word "whole" as the first word in the ingredient list.  · Choose fish and skinless chicken or turkey more often than red meat. Limit fish, poultry, and meat to 6 oz (170 g) each day.  · Limit sweets, desserts, sugars, and sugary drinks.  · Choose heart-healthy fats.  · Limit cheese to 1 oz (28 g) per day.  · Eat more home-cooked food and less restaurant, buffet, and fast food.  · Limit fried foods.  · Cook foods using methods other than frying.  · Limit canned vegetables. If you do use them, rinse them well to decrease the sodium.  · When eating at a restaurant, ask that your food be prepared with less salt, or no salt if possible.  WHAT FOODS CAN I EAT?  Seek help from a dietitian for individual calorie needs.  Grains  Whole grain or whole wheat bread. Brown rice. Whole grain or whole wheat pasta. Quinoa, bulgur, and whole grain cereals. Low-sodium cereals. Corn or whole wheat flour tortillas. Whole grain cornbread. Whole grain crackers. Low-sodium crackers.  Vegetables  Fresh or frozen vegetables  (raw, steamed, roasted, or grilled). Low-sodium or reduced-sodium tomato and vegetable juices. Low-sodium or reduced-sodium tomato sauce and paste. Low-sodium or reduced-sodium canned vegetables.   Fruits  All fresh, canned (in natural juice), or frozen fruits.  Meat and Other Protein Products  Ground beef (85% or leaner), grass-fed beef, or beef trimmed of fat. Skinless chicken or turkey. Ground chicken or turkey. Pork trimmed of fat. All fish and seafood. Eggs. Dried beans, peas, or lentils. Unsalted nuts and seeds. Unsalted canned beans.  Dairy  Low-fat dairy products, such as skim or 1% milk, 2% or reduced-fat cheeses, low-fat ricotta or cottage cheese, or plain low-fat yogurt. Low-sodium or reduced-sodium cheeses.  Fats and Oils  Tub margarines without trans fats. Light or reduced-fat mayonnaise and salad dressings (reduced sodium). Avocado. Safflower, olive, or canola oils. Natural peanut or almond butter.  Other  Unsalted popcorn and pretzels.  The items listed above may not be a complete list of recommended foods or beverages. Contact your dietitian for more options.  WHAT FOODS ARE NOT RECOMMENDED?  Grains  White bread. White pasta. White rice. Refined cornbread. Bagels and croissants. Crackers that contain trans fat.  Vegetables  Creamed or fried vegetables. Vegetables in a cheese sauce. Regular canned vegetables. Regular canned tomato sauce and paste. Regular tomato and vegetable juices.  Fruits  Dried fruits. Canned fruit in light or heavy syrup. Fruit juice.  Meat and Other Protein   Products  Fatty cuts of meat. Ribs, chicken wings, bacon, sausage, bologna, salami, chitterlings, fatback, hot dogs, bratwurst, and packaged luncheon meats. Salted nuts and seeds. Canned beans with salt.  Dairy  Whole or 2% milk, cream, half-and-half, and cream cheese. Whole-fat or sweetened yogurt. Full-fat cheeses or blue cheese. Nondairy creamers and whipped toppings. Processed cheese, cheese spreads, or cheese  curds.  Condiments  Onion and garlic salt, seasoned salt, table salt, and sea salt. Canned and packaged gravies. Worcestershire sauce. Tartar sauce. Barbecue sauce. Teriyaki sauce. Soy sauce, including reduced sodium. Steak sauce. Fish sauce. Oyster sauce. Cocktail sauce. Horseradish. Ketchup and mustard. Meat flavorings and tenderizers. Bouillon cubes. Hot sauce. Tabasco sauce. Marinades. Taco seasonings. Relishes.  Fats and Oils  Butter, stick margarine, lard, shortening, ghee, and bacon fat. Coconut, palm kernel, or palm oils. Regular salad dressings.  Other  Pickles and olives. Salted popcorn and pretzels.  The items listed above may not be a complete list of foods and beverages to avoid. Contact your dietitian for more information.  WHERE CAN I FIND MORE INFORMATION?  National Heart, Lung, and Blood Institute: www.nhlbi.nih.gov/health/health-topics/topics/dash/     This information is not intended to replace advice given to you by your health care provider. Make sure you discuss any questions you have with your health care provider.     Document Released: 06/18/2011 Document Revised: 07/20/2014 Document Reviewed: 05/03/2013  Elsevier Interactive Patient Education ©2016 Elsevier Inc.

## 2016-01-21 NOTE — Progress Notes (Signed)
Raymond Stone, is a 65 y.o. male  [id number]  [id number]  DOB - 1951/06/26  Chief Complaint  Patient presents with  . Groin Swelling        Subjective:   Raymond Stone is a 65 y.o. male here today for a follow up visit scrotal edema and bilat lower extrem edema. He states he is watching his salt intake more, but occasionally slips (had [employer] chicken yesterday).  He is still taking is lasix, which is keeping the swelling down.  Denies cp/orthopnea  C/o of severe pruritis due to his extensive body eczema, has a hard time getting comfortable sleeping at night.   Patient has No headache, No chest pain, No abdominal pain - No Nausea, No new weakness tingling or numbness, No Cough - SOB.  No problems updated.  ALLERGIES: No Known Allergies  PAST MEDICAL HISTORY: Past Medical History  Diagnosis Date  . Eczema   . Fluid collection (edema) in the arms, legs, hands and feet     MEDICATIONS AT HOME: Prior to Admission medications   Medication Sig Start Date End Date Taking? Authorizing Provider  furosemide (LASIX) 40 MG tablet Take 1 tablet (40 mg total) by mouth daily. 01/07/16  Yes Maren Reamer, MD  hydrOXYzine (VISTARIL) 50 MG capsule Take 1 capsule (50 mg total) by mouth at bedtime as needed for itching. 01/21/16  Yes Maren Reamer, MD  metoprolol tartrate (LOPRESSOR) 25 MG tablet Take 1 tablet (25 mg total) by mouth 2 (two) times daily. 01/07/16  Yes Jannessa Ogden Lazarus Gowda, MD  Tetrahydrozoline HCl (VISINE OP) Place 1-2 drops into both eyes 2 (two) times daily as needed (redness). Reported on 01/07/2016   Yes Historical Provider, MD  triamcinolone cream (KENALOG) 0.1 % APPLY A THIN LAYER 2 TIMES DAILY 12/17/15  Yes Noland Fordyce, PA-C  clobetasol (TEMOVATE) 0.05 % GEL Apply 1 application topically 2 (two) times daily. Patient not taking: Reported on 01/07/2016 12/17/15   Noland Fordyce, PA-C  predniSONE (DELTASONE) 20 MG tablet 3 tabs po daily x 3 days, then 2 tabs x 3  days, then 1.5 tabs x 3 days, then 1 tab x 3 days, then 0.5 tabs x 3 days Patient not taking: Reported on 01/07/2016 11/17/15   Clayton Bibles, PA-C     Objective:   Filed Vitals:   01/21/16 1006  BP: 132/75  Pulse: 85  Temp: 98.2 F (36.8 C)  TempSrc: Oral  Resp: 16  Weight: 188 lb (85.276 kg)  SpO2: 96%    Exam General appearance : Awake, alert, not in any distress. Speech Clear. Not toxic looking HEENT: Atraumatic and Normocephalic, Neck: supple, no JVD.  Chest:Good air entry bilaterally, no added sounds. CVS: S1 S2 regular, no murmurs/gallups or rubs. Abdomen: Bowel sounds active, Non tender and not distended with no gaurding, rigidity or rebound. Scrotum edema much improved, now about size of shriveled grapefruit. Extremities: B/L Lower Ext shows diffuse 2+ tense edema, both legs are warm to touch Neurology: Awake alert, and oriented X 3, CN II-XII grossly intact, Non focal Skin: diffuse body tough, leathery ezcema  Data Review Lab Results  Component Value Date   HGBA1C 5.8 01/31/2014    Depression screen [facility] 2/9 01/21/2016 01/07/2016 01/31/2014  Decreased Interest 0 1 2  Down, Depressed, Hopeless 2 1 3   PHQ - 2 Score 2 2 5   Altered sleeping 3 3 3   Tired, decreased energy 1 2 3   Change in appetite 1 2 3   Feeling bad or failure  about yourself  2 1 2   Trouble concentrating 1 1 2   Moving slowly or fidgety/restless 0 0 1  Suicidal thoughts 0 0 0  PHQ-9 Score 10 11 19   Difficult doing work/chores Not difficult at all Somewhat difficult -      Assessment & Plan   1. Hypervolemia, unspecified hypervolemia type - concern for chf -hasn't not done echo yet, will schedule today - BASIC METABOLIC PANEL WITH GFR - Brain natriuretic peptide - continue to encourage low salt/dash diet.  2. Scrotal edema Better, continue lasix 40 qd for now  3. Bilateral lower extremity edema See #1 and #2  4. Eczema Continue kenalog cream - hydrOXYzine (VISTARIL) 50 MG capsule; Take  1 capsule (50 mg total) by mouth at bedtime as needed for itching.  Dispense: 30 capsule; Refill: 1     Patient have been counseled extensively about nutrition and exercise  Return in about 4 weeks (around 02/18/2016) for chf concern/ / .  The patient was given clear instructions to go to ER or return to medical center if symptoms don't improve, worsen or new problems develop. The patient verbalized understanding. The patient was told to call to get lab results if they haven't heard anything in the next week.   This note has been created with Surveyor, quantity. Any transcriptional errors are unintentional.   Maren Reamer, MD, [place] and [facility] [place], [place]   01/21/2016, 10:16 AM

## 2016-01-22 ENCOUNTER — Telehealth: Payer: Self-pay

## 2016-01-22 LAB — BASIC METABOLIC PANEL WITH GFR
BUN: 8 mg/dL (ref 7–25)
CALCIUM: 9 mg/dL (ref 8.6–10.3)
CO2: 24 mmol/L (ref 20–31)
Chloride: 105 mmol/L (ref 98–110)
Creat: 0.84 mg/dL (ref 0.70–1.25)
Glucose, Bld: 92 mg/dL (ref 65–99)
Potassium: 3.9 mmol/L (ref 3.5–5.3)
Sodium: 142 mmol/L (ref 135–146)

## 2016-01-22 LAB — BRAIN NATRIURETIC PEPTIDE: BRAIN NATRIURETIC PEPTIDE: 128.8 pg/mL — AB (ref <100)

## 2016-01-22 NOTE — Telephone Encounter (Signed)
Contacted patient to go over lab results patient did not answer lvm for patient to give me a call back at their earliest convenience

## 2016-01-28 ENCOUNTER — Ambulatory Visit (HOSPITAL_COMMUNITY)
Admission: RE | Admit: 2016-01-28 | Discharge: 2016-01-28 | Disposition: A | Discharge status: Home / Self Care | Payer: Self-pay | Source: Ambulatory Visit | Attending: Internal Medicine | Admitting: Internal Medicine

## 2016-01-28 DIAGNOSIS — I509 Heart failure, unspecified: Secondary | ICD-10-CM | POA: Insufficient documentation

## 2016-01-28 DIAGNOSIS — I110 Hypertensive heart disease with heart failure: Secondary | ICD-10-CM | POA: Insufficient documentation

## 2016-01-28 DIAGNOSIS — I071 Rheumatic tricuspid insufficiency: Secondary | ICD-10-CM | POA: Insufficient documentation

## 2016-01-28 DIAGNOSIS — I340 Nonrheumatic mitral (valve) insufficiency: Secondary | ICD-10-CM | POA: Insufficient documentation

## 2016-01-28 NOTE — Progress Notes (Signed)
  Echocardiogram 2D Echocardiogram has been performed.  Jennette Dubin 01/28/2016, 12:35 PM

## 2016-01-29 ENCOUNTER — Telehealth: Payer: Self-pay | Admitting: Internal Medicine

## 2016-01-29 DIAGNOSIS — I5031 Acute diastolic (congestive) heart failure: Secondary | ICD-10-CM

## 2016-01-29 NOTE — Telephone Encounter (Signed)
Called pt, confirmed w/ dob. dw him echo results, possible pfo, diastolic chf findings. Cards referral. Pt 100% [employer] discount. Answered all questions.

## 2016-02-03 NOTE — Progress Notes (Signed)
Cardiology Office Note    Date:  02/04/2016   ID:  Raymond Stone, DOB 07-08-1951, MRN [id number]  PCP:  Maren Reamer, MD  Cardiologist:   Jenkins Rouge, MD   Chief Complaint  Patient presents with  . New Patient (Initial Visit)  . Congestive Heart Failure  . Shortness of Breath    History of Present Illness:  Lamaj Toepfer is a 65 y.o. male referred for CHF  Reviewed echo from 7/18   Impressions:  - LVEF 60-65%, mild LVH, normal wall motion, diastolic dysfunction   with elevated LV filling pressure, normal GLPSS at -21%, trivial   MR, moderate LAE, mild TR, top normal RVSP of 33 mmHG, normal   IVC, mobile IAS - cannot exclude small PFO.  He has had scrotal edema and bilateral LE edema Some improvement with lasix.  Max BNP 271 Denies STD or chronic kidney disease. Has ichthyosis with eczema Does not use salt at home but not careful when he eats out. Denies other connective tissue disease. No penile d/c or other urinary tract symtptoms  No chest pain PND/orhopnea  Past Medical History:  Diagnosis Date  . Eczema   . Fluid collection (edema) in the arms, legs, hands and feet     Past Surgical History:  Procedure Laterality Date  . I&D EXTREMITY Right 09/13/2012   Procedure: IRRIGATION AND DEBRIDEMENT EXTREMITY  RIGHT MIDDLE FINGER WITH REVISION AMPUTATION AND SKIN GRAFTING.;  Surgeon: Roseanne Kaufman, MD;  Location: [facility];  Service: Orthopedics;  Laterality: Right;    Current Medications: Outpatient Medications Prior to Visit  Medication Sig Dispense Refill  . clobetasol (TEMOVATE) 0.05 % GEL Apply 1 application topically 2 (two) times daily. 60 each 0  . furosemide (LASIX) 40 MG tablet Take 1 tablet (40 mg total) by mouth daily. 30 tablet 3  . hydrOXYzine (VISTARIL) 50 MG capsule Take 1 capsule (50 mg total) by mouth at bedtime as needed for itching. 30 capsule 1  . metoprolol tartrate (LOPRESSOR) 25 MG tablet Take 1 tablet (25 mg total) by mouth 2 (two) times daily.  180 tablet 3  . Tetrahydrozoline HCl (VISINE OP) Place 1-2 drops into both eyes 2 (two) times daily as needed (redness). Reported on 01/07/2016    . triamcinolone cream (KENALOG) 0.1 % APPLY A THIN LAYER 2 TIMES DAILY 454 g 2   No facility-administered medications prior to visit.      Allergies:   Review of patient's allergies indicates no known allergies.   Social History   Social History  . Marital status: Single    Spouse name: N/A  . Number of children: N/A  . Years of education: N/A   Social History Main Topics  . Smoking status: Never Smoker  . Smokeless tobacco: None  . Alcohol use Yes     Comment: beer everyday  . Drug use: No  . Sexual activity: Not Asked   Other Topics Concern  . None   Social History Narrative  . None     Family History:  The patient's father with HTN.    ROS:   Please see the history of present illness.    ROS All other systems reviewed and are negative.   PHYSICAL EXAM:   VS:  BP 122/76   Pulse 86   Ht 5' 9.5" (1.765 m)   Wt 185 lb 12.8 oz (84.3 kg)   BMI 27.04 kg/m    Dusky black male  HEENT: normal  Neck: no JVD, carotid bruits,  or masses Cardiac: RRR; no murmurs, rubs, or gallops,no edema  Respiratory:  clear to auscultation bilaterally, normal work of breathing GI: soft, nontender, nondistended, + BS MS: no deformity or atrophy  Skin: ichthyosis trunk arms/legs from eczema  Neuro:  Alert and Oriented x 3, Strength and sensation are intact Psych: euthymic mood, full affect  Wt Readings from Last 3 Encounters:  02/04/16 185 lb 12.8 oz (84.3 kg)  01/21/16 188 lb (85.3 kg)  01/07/16 205 lb 3.2 oz (93.1 kg)      Studies/Labs Reviewed:   EKG:  12/16/15  ST rate 102 somewhat low precordial voltage  02/04/16 SR rate 86 normal   Recent Labs: 12/17/2015: Hemoglobin 10.5 01/21/2016: Brain Natriuretic Peptide 128.8; BUN 8; Creat 0.84; Potassium 3.9; Sodium 142   Lipid Panel No results found for: CHOL, TRIG, HDL, CHOLHDL, VLDL,  LDLCALC, LDLDIRECT  Additional studies/ records that were reviewed today include:  Echo [facility] office note labs      ASSESSMENT:    1. Nephrotic syndrome   2. Establishing care with new doctor, encounter for      PLAN:  In order of problems listed above:  1. Edema:  Not clear that this is cardiac related. BNP minimally elevated and some diastolic dysfunction. On exam now no fluid excess no JVP elevation and no HJR.  Will check 24 hr urine to r/o nephrotic syndrome check TSH/T4 2. Scrotal Edema:  Persisting needs to f/u with urology  3. Eczema:  Continue kenelog f/u dermatology     Medication Adjustments/Labs and Tests Ordered: Current medicines are reviewed at length with the patient today.  Concerns regarding medicines are outlined above.  Medication changes, Labs and Tests ordered today are listed in the Patient Instructions below. Patient Instructions  Medication Instructions:  Your physician recommends that you continue on your current medications as directed. Please refer to the Current Medication list given to you today.  Labwork: Your physician recommends that you have lab work today. SED rate, T4, TSH, BMP, and 24 hour urine protien  Testing/Procedures: NONE  Follow-Up: Your physician wants you to follow-up next avialable Dr. Johnsie Cancel.    If you need a refill on your cardiac medications before your next appointment, please call your pharmacy.       Signed, Jenkins Rouge, MD  02/04/2016 9:21 AM    [address], [place], [place]  [address] Phone: [phone]; Fax: [phone]

## 2016-02-04 ENCOUNTER — Encounter: Payer: Self-pay | Admitting: Cardiovascular Disease

## 2016-02-04 ENCOUNTER — Ambulatory Visit (INDEPENDENT_AMBULATORY_CARE_PROVIDER_SITE_OTHER): Payer: No Typology Code available for payment source | Admitting: Cardiovascular Disease

## 2016-02-04 ENCOUNTER — Telehealth: Payer: Self-pay | Admitting: Internal Medicine

## 2016-02-04 VITALS — BP 122/76 | HR 86 | Ht 69.5 in | Wt 185.8 lb

## 2016-02-04 DIAGNOSIS — N049 Nephrotic syndrome with unspecified morphologic changes: Secondary | ICD-10-CM

## 2016-02-04 DIAGNOSIS — Z7689 Persons encountering health services in other specified circumstances: Secondary | ICD-10-CM

## 2016-02-04 DIAGNOSIS — Z7189 Other specified counseling: Secondary | ICD-10-CM

## 2016-02-04 LAB — BASIC METABOLIC PANEL
BUN: 14 mg/dL (ref 7–25)
CALCIUM: 9.5 mg/dL (ref 8.6–10.3)
CO2: 26 mmol/L (ref 20–31)
Chloride: 101 mmol/L (ref 98–110)
Creat: 0.82 mg/dL (ref 0.70–1.25)
GLUCOSE: 88 mg/dL (ref 65–99)
POTASSIUM: 3.6 mmol/L (ref 3.5–5.3)
Sodium: 139 mmol/L (ref 135–146)

## 2016-02-04 LAB — TSH: TSH: 1.36 mIU/L (ref 0.40–4.50)

## 2016-02-04 LAB — T4, FREE: Free T4: 0.9 ng/dL (ref 0.8–1.8)

## 2016-02-04 NOTE — Telephone Encounter (Signed)
Patient is requesting lasix and metroprolol, blood pressure medication.   Please follow up.

## 2016-02-04 NOTE — Patient Instructions (Addendum)
Medication Instructions:  Your physician recommends that you continue on your current medications as directed. Please refer to the Current Medication list given to you today.  Labwork: Your physician recommends that you have lab work today. SED rate, T4, TSH, BMP, and 24 hour urine protien  Testing/Procedures: NONE  Follow-Up: Your physician wants you to follow-up next avialable Dr. Johnsie Cancel.    If you need a refill on your cardiac medications before your next appointment, please call your pharmacy.

## 2016-02-04 NOTE — Telephone Encounter (Signed)
Medications at pharmacy.

## 2016-02-05 ENCOUNTER — Telehealth: Payer: Self-pay

## 2016-02-05 LAB — SEDIMENTATION RATE: Sed Rate: 80 mm/hr — ABNORMAL HIGH (ref 0–20)

## 2016-02-05 NOTE — Telephone Encounter (Signed)
Contacted pt to inform him that per Dr. Janne Napoleon she would like pt to schedule an appt with her to go over lab results. Pt is schedule for February 24, 2016 @9am 

## 2016-02-06 ENCOUNTER — Telehealth: Payer: Self-pay | Admitting: Cardiovascular Disease

## 2016-02-06 NOTE — Telephone Encounter (Signed)
Called patient. Patient started collecting his 24 hour urine today and will turn in collection tomorrow morning at [facility] labs. Called [facility] lab and let them know patient would be bringing his collection tomorrow morning.

## 2016-02-06 NOTE — Telephone Encounter (Signed)
New message        The lab tech states it shows the pt was given a collection kit and the lab never received the urine and was wanting to know if the pt was late bringing it in or what.  The urine was due 2 days ago.

## 2016-02-07 ENCOUNTER — Telehealth: Payer: Self-pay | Admitting: Cardiovascular Disease

## 2016-02-07 NOTE — Telephone Encounter (Signed)
New message ° ° ° ° ° °The lab had not gotten a urine sample and pt was told to return the kit. °

## 2016-02-07 NOTE — Telephone Encounter (Signed)
Talked with patient yesterday, and he will be turning in his urine collected today.

## 2016-02-10 LAB — PROTEIN, URINE, 24 HOUR
PROTEIN, URINE: 16 mg/dL (ref 5–25)
Protein, 24H Urine: 384 mg/24 h — ABNORMAL HIGH (ref <150)

## 2016-02-12 ENCOUNTER — Telehealth: Payer: Self-pay

## 2016-02-12 NOTE — Telephone Encounter (Signed)
-----   Message from Maren Reamer, MD sent at 01/08/2016 12:03 PM EDT ----- Please call pt, labs concerning for chf. i ordered echo, please help him set up. Thanks.

## 2016-02-12 NOTE — Telephone Encounter (Signed)
Clld pt - advsd of lab results. Pt stated he understood. Echo was done on 01/28/16.

## 2016-02-13 NOTE — Telephone Encounter (Signed)
Patient turned in lab and already received results.

## 2016-02-14 ENCOUNTER — Other Ambulatory Visit: Payer: Self-pay | Admitting: Internal Medicine

## 2016-02-14 ENCOUNTER — Telehealth: Payer: Self-pay | Admitting: Internal Medicine

## 2016-02-14 DIAGNOSIS — N049 Nephrotic syndrome with unspecified morphologic changes: Secondary | ICD-10-CM

## 2016-02-14 NOTE — Telephone Encounter (Signed)
Called and talked to pt about labs, confirmed dob. Significant protinuria and concerns for nephrotic syndrome. Also abnml esr as well. Has appt w/ me 7/14, will order labs than; including antinuclear antibodies (ANA), complement (C3/C4 and total hemolytic complement), serum free light chains and urine protein electrophoresis and immunofixation, syphilis serology, hepatitis B and hepatitis C serologies, and the measurement of cryoglobulins = all ordered to be collectec 7/14 appt.   Referral made to neprho as well, may need renal bx.

## 2016-02-24 ENCOUNTER — Ambulatory Visit: Payer: Self-pay | Attending: Internal Medicine | Admitting: Internal Medicine

## 2016-02-24 ENCOUNTER — Encounter: Payer: Self-pay | Admitting: Internal Medicine

## 2016-02-24 VITALS — BP 151/72 | HR 101 | Temp 98.3°F | Resp 16 | Wt 198.2 lb

## 2016-02-24 DIAGNOSIS — R079 Chest pain, unspecified: Secondary | ICD-10-CM | POA: Insufficient documentation

## 2016-02-24 DIAGNOSIS — L309 Dermatitis, unspecified: Secondary | ICD-10-CM

## 2016-02-24 DIAGNOSIS — N049 Nephrotic syndrome with unspecified morphologic changes: Secondary | ICD-10-CM | POA: Insufficient documentation

## 2016-02-24 DIAGNOSIS — Z79899 Other long term (current) drug therapy: Secondary | ICD-10-CM | POA: Insufficient documentation

## 2016-02-24 DIAGNOSIS — I10 Essential (primary) hypertension: Secondary | ICD-10-CM | POA: Insufficient documentation

## 2016-02-24 LAB — BASIC METABOLIC PANEL WITH GFR
BUN: 12 mg/dL (ref 7–25)
CHLORIDE: 104 mmol/L (ref 98–110)
CO2: 24 mmol/L (ref 20–31)
Calcium: 9.1 mg/dL (ref 8.6–10.3)
Creat: 0.92 mg/dL (ref 0.70–1.25)
GFR, Est African American: >89 mL/min (ref >=60)
GFR, Est Non African American: 88 mL/min (ref >=60)
Glucose, Bld: 91 mg/dL (ref 65–99)
POTASSIUM: 4.3 mmol/L (ref 3.5–5.3)
SODIUM: 139 mmol/L (ref 135–146)

## 2016-02-24 MED ORDER — FUROSEMIDE 40 MG PO TABS: 20.0000 mg | ORAL_TABLET | Freq: Every day | ORAL | 3 refills | Status: DC

## 2016-02-24 MED ORDER — METOPROLOL TARTRATE 25 MG PO TABS: 25.0000 mg | ORAL_TABLET | Freq: Two times a day (BID) | ORAL | 3 refills | Status: DC

## 2016-02-24 MED FILL — ?METOPROLOL 25 MG TABLET: 25 | 30 days supply | Qty: 60 | Fill #1

## 2016-02-24 MED FILL — ?FUROSEMIDE 40 MG TABLET: 40 | 30 days supply | Qty: 30 | Fill #1

## 2016-02-24 NOTE — Patient Instructions (Signed)
Nephrotic Syndrome °Nephrotic syndrome is set of findings that show there is a problem with the kidneys. These findings include:  °· High levels of protein in urine (proteinuria).   °· High blood pressure (hypertension).   °· Low levels of the protein albumin in the blood (hypoalbuminemia).   °· High levels of cholesterol (hyperlipidemia) and triglycerides (hypertriglyceridemia) in the blood °· Swelling of face, abdomen, arms and legs (edema). °Nephrotic syndrome occurs when the kidneys' filters (glomeruli) are damaged. Glomeruli remove toxins and waste products from the bloodstream. As a result of damaged glomeruli, essential products such as proteins may also be removed from the bloodstream. The loss of proteins and other substances the body needs causes nephrotic syndrome. Nephrotic syndrome may increase your risk of further kidney damage and of health problems such as blood clots and infection.  °CAUSES  °· A kidney disease that damages the glomeruli, such as: °¨ Minimal change disease.   °¨ Focal segmental glomerulosclerosis.   °¨ Membranous nephropathy.   °¨ Glomerulonephritis.   °· A condition or disease that affects other parts of the body (systemic), such as: °¨ Diabetes. °¨ Autoimmune diseases, such as lupus.  °¨ Amyloidosis. °¨ Multiple myeloma. °¨ Some types of cancers. °¨ An infection, such as hepatitis C. °· Medicines such as: °¨ Nonsteroidal anti-inflammatory drugs (NSAIDs). °¨ Some anticancer drugs. °In some cases, the cause of nephrotic syndrome is not known.  °SYMPTOMS  °You may not have noticeable symptoms. If symptoms are present, they may include:  °· Edema. °· Foamy urine. °· Unexplained weight gain. °· Loss of appetite.   °DIAGNOSIS  °Nephrotic syndrome is usually diagnosed with dipstick urine test or a 24-hour urine collection. If your test shows that you have nephrotic syndrome, additional tests may be needed to determine its cause. These may include blood, urine, imaging, or kidney biopsy  tests.  °TREATMENT  °You may receive medicines to treat symptoms or to prevent complications from occurring. These medicines may:  °· Decrease inflammation in the kidneys. °· Lower blood pressure. °· Lower cholesterol. °· Reduce the blood's ability to clot. °· Help control edema. °Further treatment will depend on the cause of your nephrotic syndrome. Your caregiver will discuss treatment options with you.  °HOME CARE INSTRUCTIONS  °· Follow your prescribed diet. °· Only take medicines as directed by your caregiver. °· Do not take any medicines (including prescription medicines, over-the-counter medicines, or nutritional supplements) unless approved by your caregiver. Many medicines can make nephrotic syndrome worse or need to have the dose adjusted. °· Keep all follow-up appointments as directed by your caregiver. °SEEK MEDICAL CARE IF: °Your symptoms do not go away as expected or you develop new symptoms.  °  °This information is not intended to replace advice given to you by your health care provider. Make sure you discuss any questions you have with your health care provider. °  °Document Released: 05/22/2004 Document Revised: 03/23/2012 Document Reviewed: 02/02/2012 °Elsevier Interactive Patient Education ©2016 [employer] ° °

## 2016-02-24 NOTE — Progress Notes (Signed)
Raymond Stone, is a 65 y.o. male  [id number]  [id number]  DOB - 03/11/1951  Chief Complaint  Patient presents with  . Follow-up    Lab         Subjective:   Raymond Stone is a 65 y.o. male here today for a follow up visit, of concerns for nephrotic syndrome.  Since starting the lasix, his lower extremity swelling and scrotal swelling is almost completely resolved.  Denies any c/o today.    Per pt ran out of his bp meds recently, as well as lasix.  Patient has No headache, No chest pain, No abdominal pain - No Nausea, No new weakness tingling or numbness, No Cough - SOB. No f/c.  No problems updated.  ALLERGIES: No Known Allergies  PAST MEDICAL HISTORY: Past Medical History:  Diagnosis Date  . Eczema   . Fluid collection (edema) in the arms, legs, hands and feet     MEDICATIONS AT HOME: Prior to Admission medications   Medication Sig Start Date End Date Taking? Authorizing Provider  clobetasol (TEMOVATE) 0.05 % GEL Apply 1 application topically 2 (two) times daily. 12/17/15  Yes Noland Fordyce, PA-C  furosemide (LASIX) 40 MG tablet Take 0.5 tablets (20 mg total) by mouth daily. 02/24/16  Yes Maren Reamer, MD  hydrOXYzine (VISTARIL) 50 MG capsule Take 1 capsule (50 mg total) by mouth at bedtime as needed for itching. 01/21/16  Yes Maren Reamer, MD  metoprolol tartrate (LOPRESSOR) 25 MG tablet Take 1 tablet (25 mg total) by mouth 2 (two) times daily. 02/24/16  Yes Maren Reamer, MD  triamcinolone cream (KENALOG) 0.1 % APPLY A THIN LAYER 2 TIMES DAILY 12/17/15  Yes Noland Fordyce, PA-C     Objective:   Vitals:   02/24/16 0851  BP: (!) 151/72  Pulse: (!) 101  Resp: 16  Temp: 98.3 F (36.8 C)  TempSrc: Oral  SpO2: 98%  Weight: 198 lb 3.2 oz (89.9 kg)    Exam General appearance : Awake, alert, not in any distress. Speech Clear. Not toxic looking, alert. Pleasant. HEENT: Atraumatic and Normocephalic, pupils equally reactive to light. Neck: supple, no  JVD.  Chest:Good air entry bilaterally, no added sounds. CVS: S1 S2 regular, no murmurs/gallups or rubs. Abdomen: Bowel sounds active, Non tender and not distended with no gaurding, rigidity or rebound. Extremities: B/L Lower Ext shows w/ trace edema, both legs are warm to touch Neurology: Awake alert, and oriented X 3, CN II-XII grossly intact, Non focal Skin:No Rash  Data Review Lab Results  Component Value Date   HGBA1C 5.8 01/31/2014    Depression screen [facility] 2/9 01/21/2016 01/07/2016 01/31/2014  Decreased Interest 0 1 2  Down, Depressed, Hopeless 2 1 3   PHQ - 2 Score 2 2 5   Altered sleeping 3 3 3   Tired, decreased energy 1 2 3   Change in appetite 1 2 3   Feeling bad or failure about yourself  2 1 2   Trouble concentrating 1 1 2   Moving slowly or fidgety/restless 0 0 1  Suicidal thoughts 0 0 0  PHQ-9 Score 10 11 19   Difficult doing work/chores Not difficult at all Somewhat difficult -      Assessment & Plan   1. Nephrotic syndrome - reduce lasix to 20mg  qday for now given significant improvement, recd pt gets a scale to measure daily weights - labs today for  including antinuclear antibodies (ANA), complement (C3/C4 and total hemolytic complement), serum free light chains and urine protein electrophoresis  and immunofixation, syphilis serology, hepatitis B and hepatitis C serologies, and the measurement of cryoglobulins  - BASIC METABOLIC PANEL WITH GFR - CP Urine Electrophoresis - nephrology cs, may ultimately need renal bx.  2. Eczema ? Systemic, amyloidosis? As cause of nephrotic syndrome? - Ambulatory referral to Dermatology  3. htn - uncontrolled, ran out of his bp meds, recd pick up at pharmacy. - metoprolol 25bid renewed, low salt diet recd. - lasix 20qday for now as well.   Patient have been counseled extensively about nutrition and exercise  Return in about 3 months (around 05/26/2016), or if symptoms worsen or fail to improve.  The patient was given clear  instructions to go to ER or return to medical center if symptoms don't improve, worsen or new problems develop. The patient verbalized understanding. The patient was told to call to get lab results if they haven't heard anything in the next week.   This note has been created with Surveyor, quantity. Any transcriptional errors are unintentional.   Maren Reamer, MD, [place] and [facility] [place], [place]   02/24/2016, 9:16 AM

## 2016-02-24 NOTE — Progress Notes (Signed)
Pt is in the office today for a follow up on lab work Pt states he is not in any pain Pt states he needs a refill on his medication

## 2016-02-25 LAB — RPR

## 2016-02-25 LAB — ANA, IFA COMPREHENSIVE PANEL
ANA: POSITIVE — AB
ENA SM AB SER-ACNC: POSITIVE — AB
SCLERODERMA (SCL-70) (ENA) ANTIBODY, IGG: NEGATIVE
SM/RNP: 1.5 — ABNORMAL HIGH
SSA (RO) (ENA) ANTIBODY, IGG: POSITIVE — AB
SSB (LA) (ENA) ANTIBODY, IGG: NEGATIVE

## 2016-02-25 LAB — ACUTE HEP PANEL AND HEP B SURFACE AB
HCV AB: NEGATIVE
HEP A IGM: NONREACTIVE
HEP B S AG: NEGATIVE
Hep B C IgM: NONREACTIVE
Hep B S Ab: NEGATIVE

## 2016-02-25 LAB — C3 AND C4
C3 Complement: 111 mg/dL (ref 90–180)
C4 Complement: 27 mg/dL (ref 16–47)

## 2016-02-25 LAB — HIV ANTIBODY (ROUTINE TESTING W REFLEX): HIV 1&2 Ab, 4th Generation: NONREACTIVE

## 2016-02-25 LAB — ANTI-NUCLEAR AB-TITER (ANA TITER): ANA Titer 1: 1:80 {titer} — ABNORMAL HIGH

## 2016-02-26 LAB — PROTEIN ELECTROPHORESIS, SERUM
ALPHA-1-GLOBULIN: 0.4 g/dL — AB (ref 0.2–0.3)
ALPHA-2-GLOBULIN: 0.9 g/dL (ref 0.5–0.9)
Albumin ELP: 3.3 g/dL — ABNORMAL LOW (ref 3.8–4.8)
Beta 2: 0.7 g/dL — ABNORMAL HIGH (ref 0.2–0.5)
Beta Globulin: 0.6 g/dL (ref 0.4–0.6)
Gamma Globulin: 1.9 g/dL — ABNORMAL HIGH (ref 0.8–1.7)
TOTAL PROTEIN, SERUM ELECTROPHOR: 7.8 g/dL (ref 6.1–8.1)

## 2016-02-26 LAB — CP URINE ELECTROPHOR
ALBUMIN UR 24 HR ELECTRO: 47.2 %
ALPHA-1-GLOBULIN, U: 21.9 %
ALPHA-2-GLOBULIN, U: 10.5 %
BETA GLOBULIN, U: 12 %
GAMMA GLOBULIN, U: 8.4 %

## 2016-03-01 LAB — CRYOGLOBULIN

## 2016-03-02 ENCOUNTER — Other Ambulatory Visit: Payer: Self-pay | Admitting: Internal Medicine

## 2016-03-02 DIAGNOSIS — M329 Systemic lupus erythematosus, unspecified: Secondary | ICD-10-CM

## 2016-03-05 ENCOUNTER — Telehealth: Payer: Self-pay

## 2016-03-05 NOTE — Telephone Encounter (Signed)
Contacted pt to go over lab results pt is aware of lab results and I made pt aware that it will take 1-2 weeks to hear about an appointment

## 2016-03-31 ENCOUNTER — Encounter: Payer: Self-pay | Admitting: Cardiovascular Disease

## 2016-04-07 NOTE — Progress Notes (Deleted)
Cardiology Office Note    Date:  04/07/2016   ID:  Raymond Stone, DOB 1951-06-04, MRN [id number]  PCP:  Maren Reamer, MD  Cardiologist:   Jenkins Rouge, MD   No chief complaint on file.   History of Present Illness:  Raymond Stone is a 65 y.o. male referred for CHF in July 2017   Reviewed echo from 7/18   Impressions:  - LVEF 60-65%, mild LVH, normal wall motion, diastolic dysfunction   with elevated LV filling pressure, normal GLPSS at -21%, trivial   MR, moderate LAE, mild TR, top normal RVSP of 33 mmHG, normal   IVC, mobile IAS - cannot exclude small PFO.  He has had scrotal edema and bilateral LE edema Some improvement with lasix.  Max BNP 271 Denies STD or chronic kidney disease. Has ichthyosis with eczema Does not use salt at home but not careful when he eats out. Denies other connective tissue disease. No penile d/c or other urinary tract symtptoms  No chest pain PND/orhopnea  F/U labs showed positive ANA and abnormal SPEP  ? Lupus 24 hr urine protein elevated at 384 mg/24 hrs but not nephrotic range  TSH normal   ESR 80    Past Medical History:  Diagnosis Date  . Eczema   . Fluid collection (edema) in the arms, legs, hands and feet     Past Surgical History:  Procedure Laterality Date  . I&D EXTREMITY Right 09/13/2012   Procedure: IRRIGATION AND DEBRIDEMENT EXTREMITY  RIGHT MIDDLE FINGER WITH REVISION AMPUTATION AND SKIN GRAFTING.;  Surgeon: Roseanne Kaufman, MD;  Location: [facility];  Service: Orthopedics;  Laterality: Right;    Current Medications: Outpatient Medications Prior to Visit  Medication Sig Dispense Refill  . clobetasol (TEMOVATE) 0.05 % GEL Apply 1 application topically 2 (two) times daily. 60 each 0  . furosemide (LASIX) 40 MG tablet Take 0.5 tablets (20 mg total) by mouth daily. 30 tablet 3  . hydrOXYzine (VISTARIL) 50 MG capsule Take 1 capsule (50 mg total) by mouth at bedtime as needed for itching. 30 capsule 1  . metoprolol tartrate  (LOPRESSOR) 25 MG tablet Take 1 tablet (25 mg total) by mouth 2 (two) times daily. 180 tablet 3  . triamcinolone cream (KENALOG) 0.1 % APPLY A THIN LAYER 2 TIMES DAILY 454 g 2   No facility-administered medications prior to visit.      Allergies:   Review of patient's allergies indicates no known allergies.   Social History   Social History  . Marital status: Single    Spouse name: N/A  . Number of children: N/A  . Years of education: N/A   Social History Main Topics  . Smoking status: Never Smoker  . Smokeless tobacco: Never Used  . Alcohol use Yes     Comment: beer everyday  . Drug use: No  . Sexual activity: Not on file   Other Topics Concern  . Not on file   Social History Narrative  . No narrative on file     Family History:  The patient's father with HTN.    ROS:   Please see the history of present illness.    ROS All other systems reviewed and are negative.   PHYSICAL EXAM:   VS:  There were no vitals taken for this visit.   70 black male  HEENT: normal  Neck: no JVD, carotid bruits, or masses Cardiac: RRR; no murmurs, rubs, or gallops,no edema  Respiratory:  clear to auscultation bilaterally,  normal work of breathing GI: soft, nontender, nondistended, + BS MS: no deformity or atrophy  Skin: ichthyosis trunk arms/legs from eczema  Neuro:  Alert and Oriented x 3, Strength and sensation are intact Psych: euthymic mood, full affect  Wt Readings from Last 3 Encounters:  02/24/16 89.9 kg (198 lb 3.2 oz)  02/04/16 84.3 kg (185 lb 12.8 oz)  01/21/16 85.3 kg (188 lb)      Studies/Labs Reviewed:   EKG:  12/16/15  ST rate 102 somewhat low precordial voltage  02/04/16 SR rate 86 normal   Recent Labs: 12/17/2015: Hemoglobin 10.5 01/21/2016: Brain Natriuretic Peptide 128.8 02/04/2016: TSH 1.36 02/24/2016: BUN 12; Creat 0.92; Potassium 4.3; Sodium 139   Lipid Panel No results found for: CHOL, TRIG, HDL, CHOLHDL, VLDL, LDLCALC, LDLDIRECT  Additional studies/  records that were reviewed today include:  E[facility] office note labs      ASSESSMENT:    No diagnosis found.   PLAN:  In order of problems listed above:  1. Edema:  Non cardiac related Improved with lasix.  F/U Renal and Rheum Has positive ANA elevated ESR and protein in urine.   2. Eczema:  Continue kenelog f/u dermatology     Medication Adjustments/Labs and Tests Ordered: Current medicines are reviewed at length with the patient today.  Concerns regarding medicines are outlined above.  Medication changes, Labs and Tests ordered today are listed in the Patient Instructions below. There are no Patient Instructions on file for this visit.   Signed, Jenkins Rouge, MD  04/07/2016 7:50 AM    Raymond Stone, [place],   [address] Phone: [phone]; Fax: [phone]

## 2016-04-13 ENCOUNTER — Ambulatory Visit: Payer: Self-pay | Admitting: Cardiovascular Disease

## 2016-04-14 ENCOUNTER — Encounter: Payer: Self-pay | Admitting: Cardiovascular Disease

## 2016-04-20 ENCOUNTER — Telehealth: Payer: Self-pay | Admitting: Internal Medicine

## 2016-04-20 NOTE — Telephone Encounter (Signed)
[facility] called to let pt. PCP know that pt. Did not keep his 6 week f/u with them.

## 2016-04-22 NOTE — Telephone Encounter (Signed)
Will route to PCP 

## 2016-05-02 NOTE — Telephone Encounter (Signed)
Please call pt and chk with him about his kidney fu appt. Please see if can reschedule. Had pretty bad le edema.  Need to r/o other causes, ie kidney problems/nephropathy.  He may have forgotten had appt. thanks

## 2016-05-05 NOTE — Telephone Encounter (Signed)
Tried contacting pt was unable to get in touch with him will try pt again another day/time

## 2016-10-08 ENCOUNTER — Telehealth: Payer: Self-pay | Admitting: Internal Medicine

## 2016-10-08 NOTE — Telephone Encounter (Signed)
Will forward to pcp

## 2016-10-08 NOTE — Telephone Encounter (Signed)
Patient came by the office to drop off documents that he wants PCP to fill out. Pt asked that it be faxed. Placing in [employer]. Please follow up.  Thank you.

## 2016-10-12 NOTE — Telephone Encounter (Signed)
Called patient to ask him if the document that he dropped off is a request for independent assessment for personal care services (PCS).

## 2016-10-20 ENCOUNTER — Encounter: Payer: Self-pay | Admitting: Internal Medicine

## 2016-10-20 DIAGNOSIS — N049 Nephrotic syndrome with unspecified morphologic changes: Secondary | ICD-10-CM | POA: Insufficient documentation

## 2016-10-20 DIAGNOSIS — N5089 Other specified disorders of the male genital organs: Secondary | ICD-10-CM | POA: Insufficient documentation

## 2016-12-02 ENCOUNTER — Telehealth: Payer: Self-pay

## 2016-12-02 NOTE — Telephone Encounter (Signed)
Received pcs forms for pt. Per Dr. Janne Napoleon pt had to be seen to get paperwork filled out. Have been trying to contact pt to schedule an appointment haven't been able to reach him will be throwing pcs forms away due to not getting in contact with pt to schedule an appointment

## 2017-01-24 ENCOUNTER — Encounter (HOSPITAL_COMMUNITY): Payer: Self-pay | Admitting: Emergency Medicine

## 2017-01-24 ENCOUNTER — Inpatient Hospital Stay (HOSPITAL_COMMUNITY)
Admission: EM | Admit: 2017-01-24 | Discharge: 2017-01-27 | DRG: 683 | Disposition: A | Discharge status: Other Acute Inpatient Hospital | Payer: Medicare Other | Attending: Internal Medicine | Admitting: Internal Medicine

## 2017-01-24 DIAGNOSIS — L26 Exfoliative dermatitis: Secondary | ICD-10-CM | POA: Diagnosis not present

## 2017-01-24 DIAGNOSIS — R7989 Other specified abnormal findings of blood chemistry: Secondary | ICD-10-CM | POA: Diagnosis present

## 2017-01-24 DIAGNOSIS — T380X5A Adverse effect of glucocorticoids and synthetic analogues, initial encounter: Secondary | ICD-10-CM | POA: Diagnosis present

## 2017-01-24 DIAGNOSIS — E875 Hyperkalemia: Secondary | ICD-10-CM | POA: Diagnosis present

## 2017-01-24 DIAGNOSIS — L308 Other specified dermatitis: Secondary | ICD-10-CM | POA: Diagnosis present

## 2017-01-24 DIAGNOSIS — L309 Dermatitis, unspecified: Secondary | ICD-10-CM | POA: Diagnosis present

## 2017-01-24 DIAGNOSIS — F1020 Alcohol dependence, uncomplicated: Secondary | ICD-10-CM | POA: Diagnosis present

## 2017-01-24 DIAGNOSIS — D72829 Elevated white blood cell count, unspecified: Secondary | ICD-10-CM | POA: Diagnosis present

## 2017-01-24 DIAGNOSIS — I10 Essential (primary) hypertension: Secondary | ICD-10-CM | POA: Diagnosis not present

## 2017-01-24 DIAGNOSIS — D473 Essential (hemorrhagic) thrombocythemia: Secondary | ICD-10-CM | POA: Diagnosis not present

## 2017-01-24 DIAGNOSIS — I5032 Chronic diastolic (congestive) heart failure: Secondary | ICD-10-CM | POA: Diagnosis present

## 2017-01-24 DIAGNOSIS — D649 Anemia, unspecified: Secondary | ICD-10-CM | POA: Diagnosis present

## 2017-01-24 DIAGNOSIS — T501X5A Adverse effect of loop [high-ceiling] diuretics, initial encounter: Secondary | ICD-10-CM | POA: Diagnosis present

## 2017-01-24 DIAGNOSIS — I110 Hypertensive heart disease with heart failure: Secondary | ICD-10-CM | POA: Diagnosis present

## 2017-01-24 DIAGNOSIS — E861 Hypovolemia: Secondary | ICD-10-CM | POA: Diagnosis present

## 2017-01-24 DIAGNOSIS — Z9114 Patient's other noncompliance with medication regimen: Secondary | ICD-10-CM

## 2017-01-24 DIAGNOSIS — N179 Acute kidney failure, unspecified: Principal | ICD-10-CM | POA: Diagnosis present

## 2017-01-24 DIAGNOSIS — D75839 Thrombocytosis, unspecified: Secondary | ICD-10-CM | POA: Diagnosis present

## 2017-01-24 HISTORY — DX: Essential (primary) hypertension: I10

## 2017-01-24 HISTORY — DX: Anemia, unspecified: D64.9

## 2017-01-24 LAB — COMPREHENSIVE METABOLIC PANEL
ALBUMIN: 2.1 g/dL — AB (ref 3.5–5.0)
ALK PHOS: 64 U/L (ref 38–126)
ALT: 10 U/L — AB (ref 17–63)
ANION GAP: 7 (ref 5–15)
AST: 10 U/L — AB (ref 15–41)
BUN: 34 mg/dL — ABNORMAL HIGH (ref 6–20)
CALCIUM: 8.2 mg/dL — AB (ref 8.9–10.3)
CO2: 20 mmol/L — AB (ref 22–32)
Chloride: 110 mmol/L (ref 101–111)
Creatinine, Ser: 2.08 mg/dL — ABNORMAL HIGH (ref 0.61–1.24)
GFR calc Af Amer: 37 mL/min — ABNORMAL LOW (ref >60)
GFR calc non Af Amer: 32 mL/min — ABNORMAL LOW (ref >60)
GLUCOSE: 128 mg/dL — AB (ref 65–99)
Potassium: 5 mmol/L (ref 3.5–5.1)
SODIUM: 137 mmol/L (ref 135–145)
Total Bilirubin: 0.6 mg/dL (ref 0.3–1.2)
Total Protein: 6.2 g/dL — ABNORMAL LOW (ref 6.5–8.1)

## 2017-01-24 LAB — SEDIMENTATION RATE: SED RATE: 49 mm/h — AB (ref 0–16)

## 2017-01-24 LAB — I-STAT CG4 LACTIC ACID, ED: Lactic Acid, Venous: 1.25 mmol/L (ref 0.5–1.9)

## 2017-01-24 MED ORDER — SODIUM CHLORIDE 0.9 % IV SOLN: INTRAVENOUS | Status: DC

## 2017-01-24 MED ORDER — ONDANSETRON HCL 4 MG PO TABS: 4.0000 mg | ORAL_TABLET | Freq: Four times a day (QID) | ORAL | Status: DC | PRN

## 2017-01-24 MED ORDER — LORAZEPAM 1 MG PO TABS: 0.0000 mg | ORAL_TABLET | Freq: Four times a day (QID) | ORAL | Status: AC

## 2017-01-24 MED ORDER — VITAMIN B-1 100 MG PO TABS
100.0000 mg | ORAL_TABLET | Freq: Every day | ORAL | Status: DC
  Administered 2017-01-24 – 2017-01-27 (×4): 100 mg via ORAL
  Filled 2017-01-24 (×4): qty 1

## 2017-01-24 MED ORDER — METOPROLOL TARTRATE 25 MG PO TABS
25.0000 mg | ORAL_TABLET | Freq: Two times a day (BID) | ORAL | Status: DC
  Administered 2017-01-25 – 2017-01-27 (×5): 25 mg via ORAL
  Filled 2017-01-24 (×5): qty 1

## 2017-01-24 MED ORDER — HYDROMORPHONE HCL 2 MG PO TABS
2.0000 mg | ORAL_TABLET | ORAL | Status: DC | PRN
  Administered 2017-01-25 – 2017-01-26 (×8): 2 mg via ORAL
  Filled 2017-01-24 (×9): qty 1

## 2017-01-24 MED ORDER — ONDANSETRON HCL 4 MG/2ML IJ SOLN: 4.0000 mg | Freq: Four times a day (QID) | INTRAMUSCULAR | Status: DC | PRN

## 2017-01-24 MED ORDER — METHYLPREDNISOLONE SODIUM SUCC 40 MG IJ SOLR
40.0000 mg | Freq: Two times a day (BID) | INTRAMUSCULAR | Status: DC
  Administered 2017-01-24 – 2017-01-27 (×6): 40 mg via INTRAVENOUS
  Filled 2017-01-24 (×8): qty 1

## 2017-01-24 MED ORDER — MORPHINE SULFATE (PF) 2 MG/ML IV SOLN: 2.0000 mg | Freq: Once | INTRAVENOUS | Status: DC

## 2017-01-24 MED ORDER — ADULT MULTIVITAMIN W/MINERALS CH
1.0000 | ORAL_TABLET | Freq: Every day | ORAL | Status: DC
  Administered 2017-01-24 – 2017-01-27 (×4): 1 via ORAL
  Filled 2017-01-24 (×4): qty 1

## 2017-01-24 MED ORDER — LORAZEPAM 1 MG PO TABS
0.0000 mg | ORAL_TABLET | Freq: Two times a day (BID) | ORAL | Status: DC
  Administered 2017-01-27: 2 mg via ORAL
  Filled 2017-01-24: qty 2

## 2017-01-24 MED ORDER — DOXYCYCLINE HYCLATE 100 MG PO TABS
100.0000 mg | ORAL_TABLET | Freq: Two times a day (BID) | ORAL | Status: DC
  Administered 2017-01-24 – 2017-01-27 (×6): 100 mg via ORAL
  Filled 2017-01-24 (×6): qty 1

## 2017-01-24 MED ORDER — ACETAMINOPHEN 325 MG PO TABS: 650.0000 mg | ORAL_TABLET | Freq: Four times a day (QID) | ORAL | Status: DC | PRN

## 2017-01-24 MED ORDER — LORAZEPAM 2 MG/ML IJ SOLN: 1.0000 mg | Freq: Four times a day (QID) | INTRAMUSCULAR | Status: DC | PRN

## 2017-01-24 MED ORDER — MORPHINE SULFATE (PF) 4 MG/ML IV SOLN
4.0000 mg | Freq: Once | INTRAVENOUS | Status: AC
  Administered 2017-01-24: 4 mg via INTRAVENOUS
  Filled 2017-01-24: qty 1

## 2017-01-24 MED ORDER — THIAMINE HCL 100 MG/ML IJ SOLN
100.0000 mg | Freq: Every day | INTRAMUSCULAR | Status: DC
  Filled 2017-01-24: qty 2

## 2017-01-24 MED ORDER — FOLIC ACID 1 MG PO TABS
1.0000 mg | ORAL_TABLET | Freq: Every day | ORAL | Status: DC
  Administered 2017-01-24 – 2017-01-27 (×4): 1 mg via ORAL
  Filled 2017-01-24 (×4): qty 1

## 2017-01-24 MED ORDER — SODIUM CHLORIDE 0.9 % IV SOLN
INTRAVENOUS | Status: AC
  Administered 2017-01-24 – 2017-01-25 (×2): via INTRAVENOUS

## 2017-01-24 MED ORDER — ACETAMINOPHEN 650 MG RE SUPP: 650.0000 mg | Freq: Four times a day (QID) | RECTAL | Status: DC | PRN

## 2017-01-24 MED ORDER — SODIUM CHLORIDE 0.9 % IV BOLUS (SEPSIS)
1000.0000 mL | Freq: Once | INTRAVENOUS | Status: AC
  Administered 2017-01-24: 1000 mL via INTRAVENOUS

## 2017-01-24 MED ORDER — HYDROCODONE-ACETAMINOPHEN 5-325 MG PO TABS
1.0000 | ORAL_TABLET | ORAL | Status: DC | PRN
  Administered 2017-01-25 – 2017-01-26 (×2): 2 via ORAL
  Filled 2017-01-24 (×3): qty 2

## 2017-01-24 MED ORDER — CLOBETASOL PROPIONATE 0.05 % EX OINT
1.0000 | TOPICAL_OINTMENT | Freq: Two times a day (BID) | CUTANEOUS | Status: DC
Start: 2017-01-24 — End: 2017-01-27
  Administered 2017-01-26 – 2017-01-27 (×2): 1 via TOPICAL
  Filled 2017-01-24: qty 15

## 2017-01-24 MED ORDER — LORAZEPAM 1 MG PO TABS: 1.0000 mg | ORAL_TABLET | Freq: Four times a day (QID) | ORAL | Status: DC | PRN

## 2017-01-24 MED ORDER — BISACODYL 5 MG PO TBEC
5.0000 mg | DELAYED_RELEASE_TABLET | Freq: Every day | ORAL | Status: DC | PRN
Start: 2017-01-24 — End: 2017-01-27

## 2017-01-24 MED ORDER — SENNOSIDES-DOCUSATE SODIUM 8.6-50 MG PO TABS: 1.0000 | ORAL_TABLET | Freq: Every evening | ORAL | Status: DC | PRN

## 2017-01-24 NOTE — ED Provider Notes (Signed)
[facility] DEPT Provider Note   CSN: [id number] Arrival date & time: 01/24/17  1453     History   Chief Complaint Chief Complaint  Patient presents with  . Eczema    HPI Raymond Stone is a 66 y.o. male.  Pt presents with worsening dry itchy painful skin, worse in the last 3 days.  He reports recent cracking of the skin in the last few weeks with open wounds and sever pain the last several days which is what brought him into the ED today.  He hasn't been taking his home prescription skin creams due to some bumps he developed using triamcinolone cream and just "being stubborn".        Past Medical History:  Diagnosis Date  . Eczema   . Fluid collection (edema) in the arms, legs, hands and feet     Patient Active Problem List   Diagnosis Date Noted  . Scrotal edema 10/20/2016  . Nephrotic syndrome 10/20/2016  . HTN (hypertension) 01/07/2016  . Eczema 04/16/2014    Past Surgical History:  Procedure Laterality Date  . I&D EXTREMITY Right 09/13/2012   Procedure: IRRIGATION AND DEBRIDEMENT EXTREMITY  RIGHT MIDDLE FINGER WITH REVISION AMPUTATION AND SKIN GRAFTING.;  Surgeon: Roseanne Kaufman, MD;  Location: [facility];  Service: Orthopedics;  Laterality: Right;       Home Medications    Prior to Admission medications   Medication Sig Start Date End Date Taking? Authorizing Provider  clobetasol (TEMOVATE) 0.05 % GEL Apply 1 application topically 2 (two) times daily. 12/17/15   Noe Gens, PA-C  furosemide (LASIX) 40 MG tablet Take 0.5 tablets (20 mg total) by mouth daily. 02/24/16   Maren Reamer, MD  hydrOXYzine (VISTARIL) 50 MG capsule Take 1 capsule (50 mg total) by mouth at bedtime as needed for itching. 01/21/16   Maren Reamer, MD  metoprolol tartrate (LOPRESSOR) 25 MG tablet Take 1 tablet (25 mg total) by mouth 2 (two) times daily. 02/24/16   Maren Reamer, MD  triamcinolone cream (KENALOG) 0.1 % APPLY A THIN LAYER 2 TIMES DAILY 12/17/15   Noe Gens,  PA-C    Family History History reviewed. No pertinent family history.  Social History Social History  Substance Use Topics  . Smoking status: Never Smoker  . Smokeless tobacco: Never Used  . Alcohol use Yes     Comment: beer everyday     Allergies   Patient has no known allergies.   Review of Systems Review of Systems  Constitutional: Negative for chills and fever.  HENT: Negative for ear pain and sore throat.   Eyes: Negative for pain and visual disturbance.  Respiratory: Negative for cough and shortness of breath.   Cardiovascular: Negative for chest pain and palpitations.  Gastrointestinal: Negative for abdominal pain and vomiting.  Genitourinary: Negative for dysuria and hematuria.  Musculoskeletal: Negative for arthralgias and back pain.  Skin: Positive for wound.  Neurological: Negative for seizures and syncope.  Hematological: Negative for adenopathy.  Psychiatric/Behavioral: Negative for agitation and behavioral problems.  All other systems reviewed and are negative.    Physical Exam Updated Vital Signs Pulse 100   Temp 97.8 F (36.6 C) (Oral)   Resp 20   SpO2 100%   Physical Exam  Constitutional: He appears well-developed and well-nourished.  HENT:  Head: Normocephalic and atraumatic.  Eyes: Conjunctivae are normal.  Neck: Neck supple.  Cardiovascular: Normal rate and regular rhythm.   No murmur heard. Pulmonary/Chest: Effort normal and breath sounds  normal. No respiratory distress.  Abdominal: Soft. There is no tenderness.  Musculoskeletal: He exhibits edema (mainly concentrated in the hands and feet).  Neurological: He is alert.  Skin: Skin is warm. Lesion (severe dryness systemically, causing open wounds 2/2 skin cracking from dryness) noted.  Psychiatric: He has a normal mood and affect.  Nursing note and vitals reviewed.    ED Treatments / Results  Labs (all labs ordered are listed, but only abnormal results are displayed) Labs Reviewed   BASIC METABOLIC PANEL  CBC WITH DIFFERENTIAL/PLATELET    EKG  EKG Interpretation None       Radiology No results found.  Procedures Procedures (including critical care time)  Medications Ordered in ED Medications  sodium chloride 0.9 % bolus 1,000 mL (not administered)  morphine 4 MG/ML injection 4 mg (not administered)     Initial Impression / Assessment and Plan / ED Course  I have reviewed the triage vital signs and the nursing notes.  Pertinent labs & imaging results that were available during my care of the patient were reviewed by me and considered in my medical decision making (see chart for details).    Severe Eczema exacerbation -Screen pt for signs of infection -IV fluids -No signs of active infection -Nurse applied skin moisturizing lotion  -Consider systemic corticosteroid therapy when infection ruled out  AKI -likely prerenal 2/2 fluid loss through skin   Final Clinical Impressions(s) / ED Diagnoses   Final diagnoses:  None    New Prescriptions New Prescriptions   No medications on file     Katherine Roan, MD 01/24/17 1800

## 2017-01-24 NOTE — ED Notes (Signed)
Bed: WTR6 Expected date:  Expected time:  Means of arrival:  Comments: Severe eczema

## 2017-01-24 NOTE — H&P (Signed)
History and Physical    Raymond Stone RJJ:[id number] DOB: [birth year] DOA: 01/24/2017  PCP: Patient, No Pcp Per   Patient coming from: Home  Chief Complaint: Skin cracking with severe pain  HPI: Raymond Stone is a 66 y.o. male with medical history significant for hypertension, chronic diastolic CHF, alcohol dependence, and severe eczema, now presenting to the emergency department for evaluation of pain in his skin condition and severe associated pain. Patient reports that he had previously been under the care of dermatology and managed with topical and oral therapies for his severe eczema, but stopped following up 3 years ago and has been off of his medications despite the urging of family to seek medical evaluation for his worsening skin condition. He reports progressive worsening in his skin dryness and cracking over the past year or more, and particularly over the past few weeks. He reports associated pain, described as severe, sharp, constant, worse with palpation, and without any alleviating factors identified. He reports some serous weeping from his skin. He reports generalized involvement with no region particularly worse than others. Denies any recent fevers or chills, denies cough or dyspnea, and denies chest pain or palpitations. He has not attempted any interventions for his symptoms prior to coming in. He reports drinking beer daily, his family at the bedside reports that he drinks heavily, but the patient is unable to quantify.  ED Course: Upon arrival to the ED, patient is found to be afebrile, saturating well on room air, mildly hypertensive, and with vitals otherwise stable. Chemistry panels notable for a BUN of 34 and serum creatinine of 2.08, up from 0.9 one year ago. CBC features a normocytic anemia with hemoglobin of 10.1, down from 10.5 one year ago. CBC also features a thrombocytosis with platelets 525,000. Lactic acid is reassuring at 1.25. Patient was treated with 1 L of normal  saline in the ED and multiple doses of IV morphine. He remained hemodynamically stable and in no apparent respiratory distress and will be admitted to the medical/surgical unit for ongoing evaluation and management of acute kidney injury and severe pain suspected secondary to severe generalized eczema with oozing of serous fluids.  Review of Systems:  All other systems reviewed and apart from HPI, are negative.  Past Medical History:  Diagnosis Date  . Anemia   . Eczema   . Fluid collection (edema) in the arms, legs, hands and feet   . Hypertension     Past Surgical History:  Procedure Laterality Date  . I&D EXTREMITY Right 09/13/2012   Procedure: IRRIGATION AND DEBRIDEMENT EXTREMITY  RIGHT MIDDLE FINGER WITH REVISION AMPUTATION AND SKIN GRAFTING.;  Surgeon: Roseanne Kaufman, MD;  Location: [facility];  Service: Orthopedics;  Laterality: Right;     reports that he has never smoked. He has never used smokeless tobacco. He reports that he drinks alcohol. He reports that he does not use drugs.  No Known Allergies  Family History  Problem Relation Age of Onset  . Lupus Daughter      Prior to Admission medications   Medication Sig Start Date End Date Taking? Authorizing Provider  naproxen sodium (ANAPROX) 220 MG tablet Take 220 mg by mouth 2 (two) times daily as needed (pain).   Yes [provider]  clobetasol (TEMOVATE) 0.05 % GEL Apply 1 application topically 2 (two) times daily. Patient not taking: Reported on 01/24/2017 12/17/15   Noe Gens, PA-C  furosemide (LASIX) 40 MG tablet Take 0.5 tablets (20 mg total) by mouth daily. Patient  not taking: Reported on 01/24/2017 02/24/16   Maren Reamer, MD  hydrOXYzine (VISTARIL) 50 MG capsule Take 1 capsule (50 mg total) by mouth at bedtime as needed for itching. Patient not taking: Reported on 01/24/2017 01/21/16   Lottie Mussel T, MD  metoprolol tartrate (LOPRESSOR) 25 MG tablet Take 1 tablet (25 mg total) by mouth 2 (two) times  daily. Patient not taking: Reported on 01/24/2017 02/24/16   Lottie Mussel T, MD  triamcinolone cream (KENALOG) 0.1 % APPLY A THIN LAYER 2 TIMES DAILY Patient not taking: Reported on 01/24/2017 12/17/15   Noe Gens, PA-C    Physical Exam: Vitals:   01/24/17 1505 01/24/17 1842  BP:  (!) 129/110  Pulse: 100 96  Resp: 20 18  Temp: 97.8 F (36.6 C)   TempSrc: Oral   SpO2: 100% 100%      Constitutional: NAD, calm, in apparent discomfort.  Eyes: PERTLA, lids and conjunctivae normal ENMT: Mucous membranes are moist. Posterior pharynx clear of any exudate or lesions.   Neck: normal, supple, no masses, no thyromegaly Respiratory: clear to auscultation bilaterally, no wheezing, no crackles. Normal respiratory effort.   Cardiovascular: Rate ~110 and regular. No diaphoresis. No significant JVD. Abdomen: No distension, no tenderness, no masses palpated. Bowel sounds normal.  Musculoskeletal: no clubbing / cyanosis. No joint deformity upper and lower extremities.   Skin: Diffuse xerosis, asteatosis, and fissures with serous weeping.  Neurologic: CN 2-12 grossly intact. Sensation intact, DTR normal. Strength 5/5 in all 4 limbs.  Psychiatric: Alert and oriented x 3. Pleasant and cooperative.     Labs on Admission: I have personally reviewed following labs and imaging studies  CBC:  Recent Labs Lab 01/24/17 1704  WBC 8.3  NEUTROABS PENDING  HGB 10.1*  HCT 30.1*  MCV 83.6  PLT 009*   Basic Metabolic Panel:  Recent Labs Lab 01/24/17 1704  NA 137  K 5.0  CL 110  CO2 20*  GLUCOSE 128*  BUN 34*  CREATININE 2.08*  CALCIUM 8.2*   GFR: CrCl cannot be calculated (Unknown ideal weight.). Liver Function Tests:  Recent Labs Lab 01/24/17 1704  AST 10*  ALT 10*  ALKPHOS 64  BILITOT 0.6  PROT 6.2*  ALBUMIN 2.1*   No results for input(s): LIPASE, AMYLASE in the last 168 hours. No results for input(s): AMMONIA in the last 168 hours. Coagulation Profile: No results for  input(s): INR, PROTIME in the last 168 hours. Cardiac Enzymes: No results for input(s): CKTOTAL, CKMB, CKMBINDEX, TROPONINI in the last 168 hours. BNP (last 3 results) No results for input(s): PROBNP in the last 8760 hours. HbA1C: No results for input(s): HGBA1C in the last 72 hours. CBG: No results for input(s): GLUCAP in the last 168 hours. Lipid Profile: No results for input(s): CHOL, HDL, LDLCALC, TRIG, CHOLHDL, LDLDIRECT in the last 72 hours. Thyroid Function Tests: No results for input(s): TSH, T4TOTAL, FREET4, T3FREE, THYROIDAB in the last 72 hours. Anemia Panel: No results for input(s): VITAMINB12, FOLATE, FERRITIN, TIBC, IRON, RETICCTPCT in the last 72 hours. Urine analysis: No results found for: COLORURINE, APPEARANCEUR, LABSPEC, PHURINE, GLUCOSEU, HGBUR, BILIRUBINUR, KETONESUR, PROTEINUR, UROBILINOGEN, NITRITE, LEUKOCYTESUR Sepsis Labs: @LABRCNTIP (procalcitonin:4,lacticidven:4) )No results found for this or any previous visit (from the past 240 hour(s)).   Radiological Exams on Admission: No results found.  EKG: Not performed.   Assessment/Plan  1. Severe eczema  - Pt presents with gradual worsening in his severe chronic dermatitis, now with serous weeping and severe pain  - Suspected eczema,  previously followed by dermatology and managed with clobetasol and a systemic therapy that he cannot name; has been lost to follow-up for years  - Plan to start IVF hydration, systemic steroid with Solu-Medrol 40 mg IV q12h, antibiotic with doxycyline, resume clobetasol  - Discussed with patient and his family that he will need to reconnect with dermatology    2. Acute kidney injury - SCr is 2.08 on admission, up from priors <1  - Suspected prerenal azotemia in setting of serous oozing from his skin lesions  - He was given 1 liter NS in ED and will be continued on NS infusion   - Avoid nephrotoxic agents, renally-dose medications, repeat chem panel in am  3. Alcohol dependence    - Pt drinks daily, unable to quantify, family reports "a lot of beer everyday"  - No signs on intoxication or withdrawal on admission  - Monitor with CIWA and prn Ativan    4. Normocytic anemia  - Hgb is 10.1 on admission, no bleeding evident  - Check anemia panel    5. Thrombocytosis  - Platelets 525,000 on admission  - Likely reactive to #1  - Repeat CBC in am   6. Hypertension  - BP elevated in ED - Previously treated with Lopressor, but pt stopped taking  - Resume Lopressor as tolerated    7. Chronic diastolic CHF  - Pt appears hypovolemic on admission  - He is being hydrated with IVF   - Follow daily wts and I/O's     DVT prophylaxis: SCD's  Code Status: Full  Family Communication: Niece updated at bedside with pt's permission Disposition Plan: Admit to med-surg Consults called: None Admission status: Inpatient     Vianne Bulls, MD [facility] Pager [phone]  If 7PM-7AM, please contact night-coverage [email or web address] Password [facility]  01/24/2017, 6:58 PM

## 2017-01-24 NOTE — ED Triage Notes (Signed)
Pt arrived with severe eczema that patient states has worsened x 1 day. Pt states he has been noncompliant with meds / cream for eczema. Eczema is from head to toe, generalized body pain, skin is cracked and peeling. Pt states blood pressures are too painful. Positive pulses in all extremities.

## 2017-01-24 NOTE — ED Provider Notes (Signed)
I saw and evaluated the patient, reviewed the resident's note and I agree with the findings and plan.   EKG Interpretation None     66 year old male with history of eczema presents with increasing pain to his integument. Denies any fever or chills. Denies any antibiotics. Has not been compliant with his topical steroid. On exam he is awake alert. He is afebrile. We'll check lab studies at this time and reassess   Lacretia Leigh, MD 01/24/17 [phone]

## 2017-01-25 LAB — CBC
HEMATOCRIT: 27.3 % — AB (ref 39.0–52.0)
Hemoglobin: 9 g/dL — ABNORMAL LOW (ref 13.0–17.0)
MCH: 27.4 pg (ref 26.0–34.0)
MCHC: 33 g/dL (ref 30.0–36.0)
MCV: 83 fL (ref 78.0–100.0)
PLATELETS: 474 10*3/uL — AB (ref 150–400)
RBC: 3.29 MIL/uL — ABNORMAL LOW (ref 4.22–5.81)
RDW: 14.8 % (ref 11.5–15.5)
WBC: 6.4 10*3/uL (ref 4.0–10.5)

## 2017-01-25 LAB — CBC WITH DIFFERENTIAL/PLATELET
BASOS ABS: 0.1 10*3/uL (ref 0.0–0.1)
Basophils Relative: 1 %
EOS ABS: 0.7 10*3/uL (ref 0.0–0.7)
Eosinophils Relative: 9 %
HCT: 30.1 % — ABNORMAL LOW (ref 39.0–52.0)
HEMOGLOBIN: 10.1 g/dL — AB (ref 13.0–17.0)
LYMPHS ABS: 3.2 10*3/uL (ref 0.7–4.0)
Lymphocytes Relative: 38 %
MCH: 28.1 pg (ref 26.0–34.0)
MCHC: 33.6 g/dL (ref 30.0–36.0)
MCV: 83.6 fL (ref 78.0–100.0)
Monocytes Absolute: 1 10*3/uL (ref 0.1–1.0)
Monocytes Relative: 12 %
NEUTROS ABS: 3.3 10*3/uL (ref 1.7–7.7)
Neutrophils Relative %: 40 %
Platelets: 525 10*3/uL — ABNORMAL HIGH (ref 150–400)
RBC: 3.6 MIL/uL — ABNORMAL LOW (ref 4.22–5.81)
RDW: 14.8 % (ref 11.5–15.5)
WBC: 8.3 10*3/uL (ref 4.0–10.5)

## 2017-01-25 LAB — RETICULOCYTES
RBC.: 3.29 MIL/uL — AB (ref 4.22–5.81)
RETIC CT PCT: 0.7 % (ref 0.4–3.1)
Retic Count, Absolute: 23 10*3/uL (ref 19.0–186.0)

## 2017-01-25 LAB — IRON AND TIBC
Iron: 7 ug/dL — ABNORMAL LOW (ref 45–182)
SATURATION RATIOS: 7 % — AB (ref 17.9–39.5)
TIBC: 101 ug/dL — AB (ref 250–450)
UIBC: 94 ug/dL

## 2017-01-25 LAB — BASIC METABOLIC PANEL
Anion gap: 6 (ref 5–15)
BUN: 31 mg/dL — AB (ref 6–20)
CO2: 19 mmol/L — ABNORMAL LOW (ref 22–32)
CREATININE: 1.97 mg/dL — AB (ref 0.61–1.24)
Calcium: 8.1 mg/dL — ABNORMAL LOW (ref 8.9–10.3)
Chloride: 114 mmol/L — ABNORMAL HIGH (ref 101–111)
GFR calc Af Amer: 39 mL/min — ABNORMAL LOW (ref >60)
GFR calc non Af Amer: 34 mL/min — ABNORMAL LOW (ref >60)
GLUCOSE: 121 mg/dL — AB (ref 65–99)
Potassium: 5.3 mmol/L — ABNORMAL HIGH (ref 3.5–5.1)
SODIUM: 139 mmol/L (ref 135–145)

## 2017-01-25 LAB — PATHOLOGIST SMEAR REVIEW

## 2017-01-25 LAB — VITAMIN B12: VITAMIN B 12: 1168 pg/mL — AB (ref 180–914)

## 2017-01-25 LAB — FOLATE: Folate: 12.8 ng/mL (ref >5.9)

## 2017-01-25 LAB — GLUCOSE, CAPILLARY: Glucose-Capillary: 97 mg/dL (ref 65–99)

## 2017-01-25 LAB — FERRITIN: FERRITIN: 178 ng/mL (ref 24–336)

## 2017-01-25 LAB — C-REACTIVE PROTEIN: CRP: 6.9 mg/dL — ABNORMAL HIGH (ref <1.0)

## 2017-01-25 LAB — SEDIMENTATION RATE: Sed Rate: 54 mm/hr — ABNORMAL HIGH (ref 0–16)

## 2017-01-25 MED ORDER — LIP MEDEX EX OINT
TOPICAL_OINTMENT | CUTANEOUS | Status: AC
  Administered 2017-01-26
  Filled 2017-01-25: qty 7

## 2017-01-25 MED ORDER — DIPHENHYDRAMINE HCL 25 MG PO CAPS
25.0000 mg | ORAL_CAPSULE | Freq: Four times a day (QID) | ORAL | Status: DC | PRN
  Administered 2017-01-25 – 2017-01-27 (×3): 25 mg via ORAL
  Filled 2017-01-25 (×3): qty 1

## 2017-01-25 NOTE — Consult Note (Signed)
WOC consulted for dermatitis Patient with skin disruption from head to toe. Skin is dry and cracked over the entire body. Patient reports present for many years and recent flare. He is not followed by dermatology.  Without confirmation of skin alteration it is very difficult to treat this patient. Topical antihistamines and oral may help but without etiology long term management is difficult.  Patient really needs to be in a hospital system with access to dermatology/skin care.  The treatment of this level of dermatologic condition is considered outside of the scope of practice for the [facility] nurse.  No real topical care needed outside of treatment for itching which has already been ordered. Any systemic treatments would need to be prescribed by the attending.  No other topical care recommended at this time.  Attending may want to consider topical skin wash like Cetaphil or Aveno at DC.  Hospital has Cetaphil lotion which can be added if needed. With the large area that has broken skin, would suggest contact precautions as well.   Discussed POC with patient and bedside nurse.  Re consult if needed, will not follow at this time. Thanks  Arieon Scalzo [employer], RN,CWOCN, CNS, [place] [phone])

## 2017-01-25 NOTE — Care Management Note (Signed)
Case Management Note  Patient Details  Name: Raymond Stone MRN: [id number] Date of Birth: 18-Jan-1951  Subjective/Objective:                  Severe eczema/ generalized dermatized requiring iv solumedrol  Action/Plan: Date:  January 25, 2017  Chart reviewed for concurrent status and case management needs.  Will continue to follow patient progress.  Discharge Planning: following for needs  Expected discharge date: [id number]  Velva Harman, BSN, [place], [place]   Expected Discharge Date:                  Expected Discharge Plan:  Home/Self Care  In-House Referral:     Discharge planning Services  CM Consult  Post Acute Care Choice:    Choice offered to:     DME Arranged:    DME Agency:     HH Arranged:    HH Agency:     Status of Service:  In process, will continue to follow  If discussed at Long Length of Stay Meetings, dates discussed:    Additional Comments:  Leeroy Cha, RN 01/25/2017, 9:50 AM

## 2017-01-25 NOTE — Progress Notes (Addendum)
Patient ID: Raymond Stone, male   DOB: 1951/03/02, 66 y.o.   MRN: [id number]  PROGRESS NOTE    Raymond Stone  [id number] DOB: 1951/01/02 DOA: 01/24/2017  PCP: Patient, No Pcp Per   Brief Narrative:  66 year old male with medical history significant for hypertension, chronic diastolic CHF, alcohol dependence, severe eczema. Pt presented to ED with worsening eczema, all over the body, painful and with intractable itching. Pt apparently has been seen by derm in past and was managed with topical and oral therapies but his last follow up was about 3 years ago. Pt was hemodynamically stable in ED. Blood work was notable for hemoglobin of 10, creatinine 2.08, normal lactic acid. Pt started on solumedrol. WOC consulted.  Assessment & Plan:   Principal Problem:  Severe eczema / Generalized dermatitis - Appreciate WOC assessment - Continue doxycycline 100 mg Q 12 hours  - Continue solumedrol 40 mg IV Q 12 hours - Continue supportive care with analgesics as needed - Added benadryl 25 mg every 6 hours as needed for itching  Active Problems:  AKI (acute kidney injury) ([place]) - New elevation in Cr compared to normal baseline values in 2017 - Questionable if pt was compliant with lasix but likely contributing factor to AKI - Continue monitor renal function while pt in hospital     Hyperkalemia - Mild  - Potassium 5.3 - Repeat potassium level this am    HTN (hypertension), essential - Continue metoprolol    Alcohol dependence (HCC) - No reported withdrawals - Continue CIWA protocol - Alcohol level not obtained on the admission     Normocytic anemia - Monitor daily CBC    Chronic diastolic CHF (congestive heart failure) (HCC) - Fairly compensated - Lasix on hold due to renal insufficiency   DVT prophylaxis: SCD's Code Status: full code  Family Communication: no family at the bedside Disposition Plan: home once dermatitis improves    Consultants:   WOC  Procedures:   None     Antimicrobials:   Doxycycline 7/15 -->    Subjective: Has severe itching this am.  Objective: Vitals:   01/24/17 1930 01/24/17 2000 01/24/17 2015 01/25/17 0559  BP: 138/70 137/67 135/64 124/65  Pulse: 99 97 (!) 104 85  Resp:   16 18  Temp:   97.6 F (36.4 C) 97.6 F (36.4 C)  TempSrc:   Oral Oral  SpO2: 98% 98% 100% 100%  Weight:   83.6 kg (184 lb 4.8 oz) 86.4 kg (190 lb 8 oz)  Height:   5\' 9"  (1.753 m)     Intake/Output Summary (Last 24 hours) at 01/25/17 0730 Last data filed at 01/25/17 0513  Gross per 24 hour  Intake          1536.25 ml  Output              350 ml  Net          1186.25 ml   Filed Weights   01/24/17 2015 01/25/17 0559  Weight: 83.6 kg (184 lb 4.8 oz) 86.4 kg (190 lb 8 oz)    Examination:  General exam: Appears calm and comfortable  Respiratory system: Clear to auscultation. Respiratory effort normal. Cardiovascular system: S1 & S2 heard, RRR. No JVD Gastrointestinal system: Abdomen is nondistended, soft and nontender. No organomegaly or masses felt. Normal bowel sounds heard. Central nervous system: Alert and oriented. No focal neurological deficits. Extremities: Symmetric 5 x 5 power. Skin: pt has xerosis and fissures with serous weeping  Psychiatry: Judgement and insight appear normal. Mood & affect appropriate.   Data Reviewed: I have personally reviewed following labs and imaging studies  CBC:  Recent Labs Lab 01/24/17 1704 01/25/17 0525  WBC 8.3 6.4  NEUTROABS PENDING  --   HGB 10.1* 9.0*  HCT 30.1* 27.3*  MCV 83.6 83.0  PLT 525* 454*   Basic Metabolic Panel:  Recent Labs Lab 01/24/17 1704 01/25/17 0525  NA 137 139  K 5.0 5.3*  CL 110 114*  CO2 20* 19*  GLUCOSE 128* 121*  BUN 34* 31*  CREATININE 2.08* 1.97*  CALCIUM 8.2* 8.1*   GFR: Estimated Creatinine Clearance: 40.7 mL/min (A) (by C-G formula based on SCr of 1.97 mg/dL (H)). Liver Function Tests:  Recent Labs Lab 01/24/17 1704  AST 10*  ALT 10*   ALKPHOS 64  BILITOT 0.6  PROT 6.2*  ALBUMIN 2.1*   No results for input(s): LIPASE, AMYLASE in the last 168 hours. No results for input(s): AMMONIA in the last 168 hours. Coagulation Profile: No results for input(s): INR, PROTIME in the last 168 hours. Cardiac Enzymes: No results for input(s): CKTOTAL, CKMB, CKMBINDEX, TROPONINI in the last 168 hours. BNP (last 3 results) No results for input(s): PROBNP in the last 8760 hours. HbA1C: No results for input(s): HGBA1C in the last 72 hours. CBG: No results for input(s): GLUCAP in the last 168 hours. Lipid Profile: No results for input(s): CHOL, HDL, LDLCALC, TRIG, CHOLHDL, LDLDIRECT in the last 72 hours. Thyroid Function Tests: No results for input(s): TSH, T4TOTAL, FREET4, T3FREE, THYROIDAB in the last 72 hours. Anemia Panel:  Recent Labs  01/25/17 0525  RETICCTPCT 0.7   Urine analysis: No results found for: COLORURINE, APPEARANCEUR, LABSPEC, PHURINE, GLUCOSEU, HGBUR, BILIRUBINUR, KETONESUR, PROTEINUR, UROBILINOGEN, NITRITE, LEUKOCYTESUR Sepsis Labs: @LABRCNTIP (procalcitonin:4,lacticidven:4)   )No results found for this or any previous visit (from the past 240 hour(s)).    Radiology Studies: No results found.      Scheduled Meds: . clobetasol ointment  1 application Topical BID  . doxycycline  100 mg Oral Q12H  . folic acid  1 mg Oral Daily  . LORazepam  0-4 mg Oral Q6H   Followed by  . [START ON 01/26/2017] LORazepam  0-4 mg Oral Q12H  . methylPREDNISolone (SOLU-MEDROL) injection  40 mg Intravenous Q12H  . metoprolol tartrate  25 mg Oral BID  . multivitamin with minerals  1 tablet Oral Daily  . thiamine  100 mg Oral Daily   Or  . thiamine  100 mg Intravenous Daily   Continuous Infusions:   LOS: 1 day    Time spent: 25 minutes  Greater than 50% of the time spent on counseling and coordinating the care.   Leisa Lenz, MD [facility] Pager [phone]  If 7PM-7AM, please contact  night-coverage [email or web address] Password [email or web address] 01/25/2017, 7:30 AM

## 2017-01-26 LAB — CBC
HEMATOCRIT: 27.4 % — AB (ref 39.0–52.0)
HEMOGLOBIN: 9 g/dL — AB (ref 13.0–17.0)
MCH: 28 pg (ref 26.0–34.0)
MCHC: 32.8 g/dL (ref 30.0–36.0)
MCV: 85.1 fL (ref 78.0–100.0)
Platelets: 472 10*3/uL — ABNORMAL HIGH (ref 150–400)
RBC: 3.22 MIL/uL — ABNORMAL LOW (ref 4.22–5.81)
RDW: 15 % (ref 11.5–15.5)
WBC: 9.6 10*3/uL (ref 4.0–10.5)

## 2017-01-26 LAB — BASIC METABOLIC PANEL
Anion gap: 6 (ref 5–15)
BUN: 35 mg/dL — AB (ref 6–20)
CALCIUM: 8.4 mg/dL — AB (ref 8.9–10.3)
CHLORIDE: 111 mmol/L (ref 101–111)
CO2: 21 mmol/L — AB (ref 22–32)
CREATININE: 1.78 mg/dL — AB (ref 0.61–1.24)
GFR calc Af Amer: 44 mL/min — ABNORMAL LOW (ref >60)
GFR calc non Af Amer: 38 mL/min — ABNORMAL LOW (ref >60)
Glucose, Bld: 144 mg/dL — ABNORMAL HIGH (ref 65–99)
Potassium: 5.5 mmol/L — ABNORMAL HIGH (ref 3.5–5.1)
SODIUM: 138 mmol/L (ref 135–145)

## 2017-01-26 LAB — GLUCOSE, CAPILLARY: Glucose-Capillary: 104 mg/dL — ABNORMAL HIGH (ref 65–99)

## 2017-01-26 NOTE — Progress Notes (Addendum)
Patient ID: Raymond Stone, male   DOB: [birth year], 66 y.o.   MRN: [id number]  PROGRESS NOTE    Raymond Stone  [id number] DOB: 08/22/1950 DOA: 01/24/2017  PCP: Patient, No Pcp Per   Brief Narrative:  66 year old male with medical history significant for hypertension, chronic diastolic CHF, alcohol dependence, severe eczema. Pt presented to ED with worsening eczema, all over the body, painful and with intractable itching. Pt apparently has been seen by derm in past and was managed with topical and oral therapies but his last follow up was about 3 years ago. Pt was hemodynamically stable in ED. Blood work was notable for hemoglobin of 10, creatinine 2.08, normal lactic acid. Pt started on solumedrol. WOC consulted.  Assessment & Plan:   Principal Problem:  Severe eczema / Generalized dermatitis - WOC assessment appreciated  - Dry and cracked skin all over the body which per pt has been present for many years - Difficult to prescribe treatment as etiology unclear and pt needs to follow with dermatology (he last followed up with derm about 3 years ago) - Pt was started on solumedrol and doxycycline - Also added as needed benadryl - Pt does report relief in symptoms compared with yesterday  - Continue present management   Active Problems:  AKI (acute kidney injury) ([place]) - Baseline Cr WNL - New elevation in Cr likely from lasix but this was placed on hold - Cr little better this am - Follow up BMP in am    Hyperkalemia - Potassium 5.3 yesterday - BMP pending this am    HTN (hypertension), essential - Continue metoprolol     Alcohol dependence ([place]) - Continue CIWA protocol - Alcohol level not obtained on the admission - No withdrawals     Normocytic anemia - Hgb stable    Chronic diastolic CHF (congestive heart failure) ([place]) - Holding lasix due to renal insufficiency   DVT prophylaxis: SCD's Code Status: full code  Family Communication:family not at the bedside this  am Disposition Plan: home once dermatitis improves    Consultants:   WOC  Procedures:   None   Antimicrobials:   Doxycycline 7/15 -->   Subjective: Feels little better this am.  Objective: Vitals:   01/25/17 1021 01/25/17 1421 01/25/17 2100 01/26/17 0515  BP: 120/71 (!) 110/55 (!) 115/56 (!) 111/51  Pulse: 100 78 85 76  Resp:  18 16 16   Temp:  97.9 F (36.6 C) 97.8 F (36.6 C) 99.1 F (37.3 C)  TempSrc:  Oral Oral Oral  SpO2:  100% 99% 100%  Weight:      Height:        Intake/Output Summary (Last 24 hours) at 01/26/17 0831 Last data filed at 01/25/17 1916  Gross per 24 hour  Intake              840 ml  Output             1000 ml  Net             -160 ml   Filed Weights   01/24/17 2015 01/25/17 0559  Weight: 83.6 kg (184 lb 4.8 oz) 86.4 kg (190 lb 8 oz)    Examination:  Physical Exam  Constitutional: Appears well-developed and well-nourished. No distress.  CVS: RRR, S1/S2 + Pulmonary: Effort and breath sounds normal, no stridor, rhonchi, wheezes, rales.  Abdominal: Soft. BS +,  no distension, tenderness, rebound or guarding.  Musculoskeletal: Normal range of motion. No edema and no  tenderness.  Lymphadenopathy: No lymphadenopathy noted, cervical, inguinal. Neuro: Alert. Normal reflexes, muscle tone coordination. No cranial nerve deficit. Skin: xerosis all over the body, dry and cracked skin Psychiatric: Normal mood and affect. Behavior, judgment, thought content normal.     Data Reviewed: I have personally reviewed following labs and imaging studies  CBC:  Recent Labs Lab 01/24/17 1704 01/25/17 0525  WBC 8.3 6.4  NEUTROABS 3.3  --   HGB 10.1* 9.0*  HCT 30.1* 27.3*  MCV 83.6 83.0  PLT 525* 035*   Basic Metabolic Panel:  Recent Labs Lab 01/24/17 1704 01/25/17 0525  NA 137 139  K 5.0 5.3*  CL 110 114*  CO2 20* 19*  GLUCOSE 128* 121*  BUN 34* 31*  CREATININE 2.08* 1.97*  CALCIUM 8.2* 8.1*   GFR: Estimated Creatinine Clearance:  40.7 mL/min (A) (by C-G formula based on SCr of 1.97 mg/dL (H)). Liver Function Tests:  Recent Labs Lab 01/24/17 1704  AST 10*  ALT 10*  ALKPHOS 64  BILITOT 0.6  PROT 6.2*  ALBUMIN 2.1*   No results for input(s): LIPASE, AMYLASE in the last 168 hours. No results for input(s): AMMONIA in the last 168 hours. Coagulation Profile: No results for input(s): INR, PROTIME in the last 168 hours. Cardiac Enzymes: No results for input(s): CKTOTAL, CKMB, CKMBINDEX, TROPONINI in the last 168 hours. BNP (last 3 results) No results for input(s): PROBNP in the last 8760 hours. HbA1C: No results for input(s): HGBA1C in the last 72 hours. CBG:  Recent Labs Lab 01/25/17 0758 01/26/17 0800  GLUCAP 97 104*   Lipid Profile: No results for input(s): CHOL, HDL, LDLCALC, TRIG, CHOLHDL, LDLDIRECT in the last 72 hours. Thyroid Function Tests: No results for input(s): TSH, T4TOTAL, FREET4, T3FREE, THYROIDAB in the last 72 hours. Anemia Panel:  Recent Labs  01/25/17 0525  VITAMINB12 1,168*  FOLATE 12.8  FERRITIN 178  TIBC 101*  IRON 7*  RETICCTPCT 0.7   Urine analysis: No results found for: COLORURINE, APPEARANCEUR, LABSPEC, PHURINE, GLUCOSEU, HGBUR, BILIRUBINUR, KETONESUR, PROTEINUR, UROBILINOGEN, NITRITE, LEUKOCYTESUR Sepsis Labs: @LABRCNTIP (procalcitonin:4,lacticidven:4)   )No results found for this or any previous visit (from the past 240 hour(s)).    Radiology Studies: No results found.      Scheduled Meds: . clobetasol ointment  1 application Topical BID  . doxycycline  100 mg Oral Q12H  . folic acid  1 mg Oral Daily  . LORazepam  0-4 mg Oral Q6H   Followed by  . LORazepam  0-4 mg Oral Q12H  . methylPREDNISolone (SOLU-MEDROL) injection  40 mg Intravenous Q12H  . metoprolol tartrate  25 mg Oral BID  . multivitamin with minerals  1 tablet Oral Daily  . thiamine  100 mg Oral Daily   Or  . thiamine  100 mg Intravenous Daily   Continuous Infusions:   LOS: 2 days     Time spent: 25 minutes  Greater than 50% of the time spent on counseling and coordinating the care.   Leisa Lenz, MD [facility] Pager [phone]  If 7PM-7AM, please contact night-coverage [email or web address] Password [facility] 01/26/2017, 8:31 AM

## 2017-01-27 DIAGNOSIS — I5032 Chronic diastolic (congestive) heart failure: Secondary | ICD-10-CM

## 2017-01-27 DIAGNOSIS — L309 Dermatitis, unspecified: Secondary | ICD-10-CM

## 2017-01-27 DIAGNOSIS — F1020 Alcohol dependence, uncomplicated: Secondary | ICD-10-CM

## 2017-01-27 DIAGNOSIS — N179 Acute kidney failure, unspecified: Principal | ICD-10-CM

## 2017-01-27 DIAGNOSIS — D72829 Elevated white blood cell count, unspecified: Secondary | ICD-10-CM

## 2017-01-27 DIAGNOSIS — L308 Other specified dermatitis: Secondary | ICD-10-CM

## 2017-01-27 DIAGNOSIS — I10 Essential (primary) hypertension: Secondary | ICD-10-CM

## 2017-01-27 DIAGNOSIS — D649 Anemia, unspecified: Secondary | ICD-10-CM

## 2017-01-27 DIAGNOSIS — L26 Exfoliative dermatitis: Secondary | ICD-10-CM

## 2017-01-27 DIAGNOSIS — D473 Essential (hemorrhagic) thrombocythemia: Secondary | ICD-10-CM

## 2017-01-27 LAB — CBC
HEMATOCRIT: 32.7 % — AB (ref 39.0–52.0)
HEMOGLOBIN: 10.7 g/dL — AB (ref 13.0–17.0)
MCH: 27.9 pg (ref 26.0–34.0)
MCHC: 32.7 g/dL (ref 30.0–36.0)
MCV: 85.2 fL (ref 78.0–100.0)
Platelets: 454 10*3/uL — ABNORMAL HIGH (ref 150–400)
RBC: 3.84 MIL/uL — ABNORMAL LOW (ref 4.22–5.81)
RDW: 15 % (ref 11.5–15.5)
WBC: 13.5 10*3/uL — AB (ref 4.0–10.5)

## 2017-01-27 LAB — BASIC METABOLIC PANEL
Anion gap: 9 (ref 5–15)
BUN: 35 mg/dL — ABNORMAL HIGH (ref 6–20)
CHLORIDE: 111 mmol/L (ref 101–111)
CO2: 20 mmol/L — AB (ref 22–32)
Calcium: 8.4 mg/dL — ABNORMAL LOW (ref 8.9–10.3)
Creatinine, Ser: 1.61 mg/dL — ABNORMAL HIGH (ref 0.61–1.24)
GFR calc non Af Amer: 43 mL/min — ABNORMAL LOW (ref >60)
GFR, EST AFRICAN AMERICAN: 50 mL/min — AB (ref >60)
Glucose, Bld: 104 mg/dL — ABNORMAL HIGH (ref 65–99)
Potassium: 5.5 mmol/L — ABNORMAL HIGH (ref 3.5–5.1)
Sodium: 140 mmol/L (ref 135–145)

## 2017-01-27 LAB — PHOSPHORUS: PHOSPHORUS: 3.1 mg/dL (ref 2.5–4.6)

## 2017-01-27 LAB — GLUCOSE, CAPILLARY: Glucose-Capillary: 112 mg/dL — ABNORMAL HIGH (ref 65–99)

## 2017-01-27 LAB — MAGNESIUM: MAGNESIUM: 1.8 mg/dL (ref 1.7–2.4)

## 2017-01-27 MED ORDER — BISACODYL 5 MG PO TBEC: 5.0000 mg | DELAYED_RELEASE_TABLET | Freq: Every day | ORAL | 0 refills | Status: DC | PRN

## 2017-01-27 MED ORDER — FOLIC ACID 1 MG PO TABS: 1.0000 mg | ORAL_TABLET | Freq: Every day | ORAL | 0 refills | Status: DC

## 2017-01-27 MED ORDER — SODIUM POLYSTYRENE SULFONATE 15 GM/60ML PO SUSP
30.0000 g | Freq: Once | ORAL | Status: AC
Start: 2017-01-27 — End: 2017-01-27
  Administered 2017-01-27: 30 g via ORAL
  Filled 2017-01-27: qty 120

## 2017-01-27 MED ORDER — SENNOSIDES-DOCUSATE SODIUM 8.6-50 MG PO TABS: 1.0000 | ORAL_TABLET | Freq: Every evening | ORAL | 0 refills | Status: DC | PRN

## 2017-01-27 MED ORDER — ADULT MULTIVITAMIN W/MINERALS CH: 1.0000 | ORAL_TABLET | Freq: Every day | ORAL | 0 refills | Status: DC

## 2017-01-27 MED ORDER — DIPHENHYDRAMINE HCL 25 MG PO CAPS: 25.0000 mg | ORAL_CAPSULE | Freq: Four times a day (QID) | ORAL | 0 refills | Status: DC | PRN

## 2017-01-27 MED ORDER — METHYLPREDNISOLONE SODIUM SUCC 40 MG IJ SOLR: 40.0000 mg | Freq: Two times a day (BID) | INTRAMUSCULAR | 0 refills | Status: DC

## 2017-01-27 MED ORDER — DOXYCYCLINE HYCLATE 100 MG PO TABS: 100.0000 mg | ORAL_TABLET | Freq: Two times a day (BID) | ORAL | 0 refills | Status: DC

## 2017-01-27 MED ORDER — THIAMINE HCL 100 MG PO TABS: 100.0000 mg | ORAL_TABLET | Freq: Every day | ORAL | 0 refills | Status: DC

## 2017-01-27 NOTE — Progress Notes (Signed)
Patient transferred to [facility] room 967.  Family had all belongings.

## 2017-01-27 NOTE — Discharge Summary (Signed)
Physician Discharge Summary  Raymond Stone [facility]:[id number] DOB: 05-31-1951 DOA: 01/24/2017  PCP: Patient, No Pcp Per  Admit date: 01/24/2017 Discharge date: 01/27/2017  Admitted From: Home Disposition: Transfer to [facility] Dr. Kirke Shaggy   Recommendations for Outpatient Follow-up:  1. Follow up with PCP in 1-2 weeks 2. Needs to establish with outpatient Dermatology again after evaluated by Inpatient Dermatology 3. Please obtain CMP/CBC in one week after Discharge  Home Health: No Equipment/Devices: None  Discharge Condition: Stable  CODE STATUS: FULL CODE Diet recommendation:   Brief/Interim Summary: The patient is a 66 year old male with medical history significant for hypertension, Chronic diastolic CHF, alcohol dependence, severe eczema who presented to ED with worsening eczema/generalized dermatitis, all over the body, painful and with intractable itching. Pt apparently has been seen by Dermatology in past and was managed with topical and oral therapies but his last follow up was about 3 years ago. Pt was hemodynamically stable in ED. Blood work was notable for hemoglobin of 10, creatinine 2.08, normal lactic acid. Pt started on IV solumedrol and WOC consulted. Patient minimally improved with IV Steroids and with po Doxycycline and WOC recommended Dermatology consultation. Patient will be transferred to [facility] to the care of Dr. Kirke Shaggy, and will be evaluated by Dermatology as an Inpatient.   Discharge Diagnoses:  Principal Problem:   AKI (acute kidney injury) (HCC) Active Problems:   Severe eczema   HTN (hypertension)   Eczema [place]   Alcohol dependence (HCC)   Normocytic anemia   Thrombocytosis (HCC)   Chronic diastolic CHF (congestive heart failure) (HCC)  Severe Eczema / Generalized Dermatitis -CRP was elevated at 6.9, ESR was 54 -WOC assessment appreciated  -Dry and cracked skin all over the body which per pt has been  present for many years however worsened  -Difficult to prescribe treatment as etiology unclear and pt needs to follow with dermatology (he last followed up with derm about 3 years ago) -Pt was started on Solumedrol and doxycycline -Also added as needed benadryl -Pt does report minimal relief in symptoms compared with yesterday  -Will transfer to [facility] so patient can have a formal Dermatology Evaluation; Patient will be transferred to the care of Hospitalist Dr. Lonia Chimera  AKI (acute kidney injury) (HCC) - Baseline Cr WNL -New elevation in Cr likely from lasix but this was placed on hold -Cr little better this am as BUN/Cr went from  31/1.97 -> 35/1.61 -Follow up BMP in AM   Hyperkalemia -Potassium 5.5 this AM -Given 1 dose of po Kayexalate  -Repeat BMP in AM  HTN (hypertension), essential - Continue metoprolol   Alcohol dependence (HCC) -Continue CIWA protocol -Alcohol level not obtained on the admission -No withdrawals noted but continue to Monitor for Withdrawals  -C/w Folic Acid, MVI, and Thiamine  Normocytic anemia -Anemia Panel showed Iron level of 7, UIBC of 94, TIBC of 101, Saturation Ratios of 7, Ferritin of 178 -Patient's Hb/Hct went from 9.0/27.3 -> 10.7/32.7 -Repeat CBC in AM  Chronic diastolic CHF (congestive heart failure) (HCC) -Appears Euvolemic -Holding lasix due to Renal Insufficiency   Thrombocytosis -Likely reactive  -Continue to Monitor and Repeat CBC in AM  Leukocytosis -Likely 2/2 to IV Steroid Use -Continue to Monitor and repeat CBC in AM  Discharge Instructions  Discharge Instructions    Call MD for:  difficulty breathing, headache or visual disturbances    Complete by:  As directed    Call MD for:  extreme  fatigue    Complete by:  As directed    Call MD for:  hives    Complete by:  As directed    Call MD for:  persistant dizziness or light-headedness    Complete by:  As directed    Call MD for:  persistant nausea and  vomiting    Complete by:  As directed    Call MD for:  redness, tenderness, or signs of infection (pain, swelling, redness, odor or green/yellow discharge around incision site)    Complete by:  As directed    Call MD for:  severe uncontrolled pain    Complete by:  As directed    Call MD for:  temperature >100.4    Complete by:  As directed    Diet - low sodium heart healthy    Complete by:  As directed    Discharge instructions    Complete by:  As directed    Follow up Care at [facility]   Increase activity slowly    Complete by:  As directed      Allergies as of 01/27/2017   No Known Allergies     Medication List    STOP taking these medications   furosemide 40 MG tablet Commonly known as:  LASIX   naproxen sodium 220 MG tablet Commonly known as:  ANAPROX     TAKE these medications   bisacodyl 5 MG EC tablet Commonly known as:  DULCOLAX Take 1 tablet (5 mg total) by mouth daily as needed for moderate constipation.   clobetasol 0.05 % Gel Commonly known as:  TEMOVATE Apply 1 application topically 2 (two) times daily.   diphenhydrAMINE 25 mg capsule Commonly known as:  BENADRYL Take 1 capsule (25 mg total) by mouth every 6 (six) hours as needed for itching.   doxycycline 100 MG tablet Commonly known as:  VIBRA-TABS Take 1 tablet (100 mg total) by mouth every 12 (twelve) hours.   folic acid 1 MG tablet Commonly known as:  FOLVITE Take 1 tablet (1 mg total) by mouth daily.   hydrOXYzine 50 MG capsule Commonly known as:  VISTARIL Take 1 capsule (50 mg total) by mouth at bedtime as needed for itching.   methylPREDNISolone sodium succinate 40 mg/mL injection Commonly known as:  SOLU-MEDROL Inject 1 mL (40 mg total) into the vein every 12 (twelve) hours.   metoprolol tartrate 25 MG tablet Commonly known as:  LOPRESSOR Take 1 tablet (25 mg total) by mouth 2 (two) times daily.   multivitamin with minerals Tabs tablet Take 1 tablet by mouth daily.   senna-docusate  8.6-50 MG tablet Commonly known as:  Senokot-S Take 1 tablet by mouth at bedtime as needed for mild constipation.   thiamine 100 MG tablet Take 1 tablet (100 mg total) by mouth daily.   triamcinolone cream 0.1 % Commonly known as:  KENALOG APPLY A THIN LAYER 2 TIMES DAILY       No Known Allergies  Consultations:  Wound Nurse  Procedures/Studies:  No results found.  Subjective: Seen and examined at bedside and had extreme pain and itching. States whole body has gotten worse and scaly. Thinks it might be slightly better but states has no relief. No CP or SOB. No other concerns or complaints at this time.   Discharge Exam: Vitals:   01/26/17 2215 01/27/17 0541  BP: 130/67 (!) 118/50  Pulse: 76 79  Resp: 16 16  Temp: (!) 97.5 F (36.4 C) 98.5 F (36.9 C)  Vitals:   01/26/17 0515 01/26/17 1355 01/26/17 2215 01/27/17 0541  BP: (!) 111/51 107/73 130/67 (!) 118/50  Pulse: 76 76 76 79  Resp: '16 16 16 16  '$ Temp: 99.1 F (37.3 C) 98.8 F (37.1 C) (!) 97.5 F (36.4 C) 98.5 F (36.9 C)  TempSrc: Oral Oral Oral Oral  SpO2: 100% 100% 100% 99%  Weight:      Height:       General: Pt is alert, awake, not in acute distress Cardiovascular: RRR, S1/S2 +, no rubs, no gallops Respiratory: CTA bilaterally, no wheezing, no rhonchi Abdominal: Soft, NT, ND, bowel sounds + Extremities: no edema, no cyanosis Skin: Diffuse Xerosis, dry, and cracked skin with some sloughing. No appreciable mucosal involvement   The results of significant diagnostics from this hospitalization (including imaging, microbiology, ancillary and laboratory) are listed below for reference.    Microbiology: No results found for this or any previous visit (from the past 240 hour(s)).   Labs: BNP (last 3 results) No results for input(s): BNP in the last 8760 hours. Basic Metabolic Panel:  Recent Labs Lab 01/24/17 1704 01/25/17 0525 01/26/17 0912 01/27/17 0743 01/27/17 0948  NA 137 139 138 140  --    K 5.0 5.3* 5.5* 5.5*  --   CL 110 114* 111 111  --   CO2 20* 19* 21* 20*  --   GLUCOSE 128* 121* 144* 104*  --   BUN 34* 31* 35* 35*  --   CREATININE 2.08* 1.97* 1.78* 1.61*  --   CALCIUM 8.2* 8.1* 8.4* 8.4*  --   MG  --   --   --   --  1.8  PHOS  --   --   --   --  3.1   Liver Function Tests:  Recent Labs Lab 01/24/17 1704  AST 10*  ALT 10*  ALKPHOS 64  BILITOT 0.6  PROT 6.2*  ALBUMIN 2.1*   No results for input(s): LIPASE, AMYLASE in the last 168 hours. No results for input(s): AMMONIA in the last 168 hours. CBC:  Recent Labs Lab 01/24/17 1704 01/25/17 0525 01/26/17 0912 01/27/17 0743  WBC 8.3 6.4 9.6 13.5*  NEUTROABS 3.3  --   --   --   HGB 10.1* 9.0* 9.0* 10.7*  HCT 30.1* 27.3* 27.4* 32.7*  MCV 83.6 83.0 85.1 85.2  PLT 525* 474* 472* 454*   Cardiac Enzymes: No results for input(s): CKTOTAL, CKMB, CKMBINDEX, TROPONINI in the last 168 hours. BNP: Invalid input(s): POCBNP CBG:  Recent Labs Lab 01/25/17 0758 01/26/17 0800 01/27/17 0751  GLUCAP 97 104* 112*   D-Dimer No results for input(s): DDIMER in the last 72 hours. Hgb A1c No results for input(s): HGBA1C in the last 72 hours. Lipid Profile No results for input(s): CHOL, HDL, LDLCALC, TRIG, CHOLHDL, LDLDIRECT in the last 72 hours. Thyroid function studies No results for input(s): TSH, T4TOTAL, T3FREE, THYROIDAB in the last 72 hours.  Invalid input(s): FREET3 Anemia work up  Recent Labs  01/25/17 0525  VITAMINB12 1,168*  FOLATE 12.8  FERRITIN 178  TIBC 101*  IRON 7*  RETICCTPCT 0.7   Urinalysis No results found for: COLORURINE, APPEARANCEUR, LABSPEC, [place], GLUCOSEU, HGBUR, BILIRUBINUR, KETONESUR, PROTEINUR, UROBILINOGEN, NITRITE, LEUKOCYTESUR Sepsis Labs Invalid input(s): PROCALCITONIN,  WBC,  LACTICIDVEN Microbiology No results found for this or any previous visit (from the past 240 hour(s)).  Time coordinating discharge: 35 minutes  SIGNED:  Kerney Elbe, DO [facility] 01/27/2017, 12:15 PM Pager [phone]  If 7PM-7AM, please contact night-coverage [email or web address] Password [email or web address]

## 2017-01-27 NOTE — Progress Notes (Signed)
Report given to [facility] RN.

## 2017-02-11 MED FILL — DESONIDE 0.05% OINTMENT: 0.05 | 15 days supply | Qty: 60 | Fill #0

## 2017-02-11 MED FILL — TRIAMCINOLONE 0.1% OINTMENT: 0.1 | 30 days supply | Qty: 454 | Fill #0

## 2017-02-17 ENCOUNTER — Ambulatory Visit: Payer: Self-pay | Admitting: Family Medicine

## 2017-03-08 ENCOUNTER — Encounter: Payer: Self-pay | Admitting: Family Medicine

## 2017-03-08 ENCOUNTER — Ambulatory Visit: Payer: Medicare Other | Attending: Family Medicine | Admitting: Family Medicine

## 2017-03-08 VITALS — BP 132/79 | HR 77 | Temp 98.6°F | Resp 18 | Ht 70.0 in | Wt 195.0 lb

## 2017-03-08 DIAGNOSIS — I509 Heart failure, unspecified: Secondary | ICD-10-CM | POA: Diagnosis not present

## 2017-03-08 DIAGNOSIS — I10 Essential (primary) hypertension: Secondary | ICD-10-CM | POA: Diagnosis not present

## 2017-03-08 DIAGNOSIS — R809 Proteinuria, unspecified: Secondary | ICD-10-CM

## 2017-03-08 DIAGNOSIS — Z8639 Personal history of other endocrine, nutritional and metabolic disease: Secondary | ICD-10-CM

## 2017-03-08 DIAGNOSIS — E875 Hyperkalemia: Secondary | ICD-10-CM | POA: Insufficient documentation

## 2017-03-08 DIAGNOSIS — D649 Anemia, unspecified: Secondary | ICD-10-CM | POA: Diagnosis not present

## 2017-03-08 DIAGNOSIS — Z862 Personal history of diseases of the blood and blood-forming organs and certain disorders involving the immune mechanism: Secondary | ICD-10-CM

## 2017-03-08 DIAGNOSIS — L309 Dermatitis, unspecified: Secondary | ICD-10-CM | POA: Insufficient documentation

## 2017-03-08 DIAGNOSIS — F1020 Alcohol dependence, uncomplicated: Secondary | ICD-10-CM | POA: Insufficient documentation

## 2017-03-08 DIAGNOSIS — F1021 Alcohol dependence, in remission: Secondary | ICD-10-CM | POA: Diagnosis not present

## 2017-03-08 DIAGNOSIS — I110 Hypertensive heart disease with heart failure: Secondary | ICD-10-CM | POA: Diagnosis not present

## 2017-03-08 DIAGNOSIS — Z1211 Encounter for screening for malignant neoplasm of colon: Secondary | ICD-10-CM

## 2017-03-08 LAB — POCT UA - MICROALBUMIN
CREATININE, POC: 300 mg/dL
Microalbumin Ur, POC: 150 mg/L

## 2017-03-08 MED ORDER — LISINOPRIL 2.5 MG PO TABS: 2.5000 mg | ORAL_TABLET | Freq: Every day | ORAL | 2 refills | Status: DC

## 2017-03-08 MED ORDER — ADULT MULTIVITAMIN W/MINERALS CH: 1.0000 | ORAL_TABLET | Freq: Every day | ORAL | 6 refills | Status: DC

## 2017-03-08 MED ORDER — THIAMINE HCL 100 MG PO TABS: 100.0000 mg | ORAL_TABLET | Freq: Every day | ORAL | 6 refills | Status: DC

## 2017-03-08 MED ORDER — METOPROLOL TARTRATE 25 MG PO TABS: 25.0000 mg | ORAL_TABLET | Freq: Every day | ORAL | 2 refills | Status: DC

## 2017-03-08 MED ORDER — FOLIC ACID 1 MG PO TABS: 1.0000 mg | ORAL_TABLET | Freq: Every day | ORAL | 6 refills | Status: DC

## 2017-03-08 NOTE — Progress Notes (Signed)
Subjective:  Patient ID: Raymond Stone, male    DOB: Dec 15, 1950  Age: 66 y.o. MRN: [id number]  CC: Hypertension   HPI Raymond Stone presents for history of HTN. He is not exercising and is not adherent to low salt diet.  He does not check BP at home. He bring prescriptions medication with him, metoprolol 25 mg QD. He reports not taking his BP medication prior to office visit. Cardiac symptoms none. Patient denies chest pain, chest pressure/discomfort, claudication, dyspnea, lower extremity edema, near-syncope, palpitations and syncope.  Cardiovascular risk factors: advanced age (older than 4 for men, 52 for women), hypertension, male gender and sedentary lifestyle. Use of agents associated with hypertension: none. History of target organ damage: heart failure. History of alcohol dependence. Reports drinking 2-16 oz beers per day. He denies any anxiety or depression. He declines to speak with LCSW at this time. History of severe eczema reports receiving treatment from specialist at [facility]. Denies history of asthma.   Outpatient Medications Prior to Visit  Medication Sig Dispense Refill  . bisacodyl (DULCOLAX) 5 MG EC tablet Take 1 tablet (5 mg total) by mouth daily as needed for moderate constipation. 30 tablet 0  . diphenhydrAMINE (BENADRYL) 25 mg capsule Take 1 capsule (25 mg total) by mouth every 6 (six) hours as needed for itching. 30 capsule 0  . senna-docusate (SENOKOT-S) 8.6-50 MG tablet Take 1 tablet by mouth at bedtime as needed for mild constipation. 30 tablet 0  . folic acid (FOLVITE) 1 MG tablet Take 1 tablet (1 mg total) by mouth daily. 30 tablet 0  . thiamine 100 MG tablet Take 1 tablet (100 mg total) by mouth daily. 30 tablet 0  . triamcinolone cream (KENALOG) 0.1 % APPLY A THIN LAYER 2 TIMES DAILY 454 g 2  . clobetasol (TEMOVATE) 0.05 % GEL Apply 1 application topically 2 (two) times daily. (Patient not taking: Reported on 01/24/2017) 60 each 0  . doxycycline (VIBRA-TABS)  100 MG tablet Take 1 tablet (100 mg total) by mouth every 12 (twelve) hours. 14 tablet 0  . hydrOXYzine (VISTARIL) 50 MG capsule Take 1 capsule (50 mg total) by mouth at bedtime as needed for itching. (Patient not taking: Reported on 01/24/2017) 30 capsule 1  . methylPREDNISolone sodium succinate (SOLU-MEDROL) 40 mg/mL injection Inject 1 mL (40 mg total) into the vein every 12 (twelve) hours. 1 each 0  . metoprolol tartrate (LOPRESSOR) 25 MG tablet Take 1 tablet (25 mg total) by mouth 2 (two) times daily. (Patient not taking: Reported on 01/24/2017) 180 tablet 3  . Multiple Vitamin (MULTIVITAMIN WITH MINERALS) TABS tablet Take 1 tablet by mouth daily. 30 tablet 0   No facility-administered medications prior to visit.     ROS Review of Systems  Constitutional: Negative.   Eyes: Negative.   Respiratory: Negative.   Cardiovascular: Negative.   Gastrointestinal: Negative.   Skin: Positive for rash (history of severe ezcema).  Psychiatric/Behavioral: Positive for behavioral problems (history of alcohol dependence). Negative for dysphoric mood and suicidal ideas. The patient is not nervous/anxious.     Objective:  BP 132/79 (BP Location: Left Arm, Patient Position: Sitting, Cuff Size: Large)   Pulse 77   Temp 98.6 F (37 C) (Oral)   Resp 18   Ht 5\' 10"  (1.778 m)   Wt 195 lb (88.5 kg)   SpO2 100%   BMI 27.98 kg/m   BP/Weight 03/08/2017 01/27/2017 4/33/2951  Systolic BP 884 166 -  Diastolic BP 79 53 -  Wt. (  Lbs) 195 - 190.5  BMI 27.98 - 28.13     Physical Exam  Constitutional: He appears well-developed and well-nourished.  Eyes: Pupils are equal, round, and reactive to light. Conjunctivae are normal.  Neck: Normal range of motion. No JVD present.  Cardiovascular: Normal rate, regular rhythm, normal heart sounds and intact distal pulses.   Pulmonary/Chest: Effort normal and breath sounds normal.  Abdominal: Soft. Bowel sounds are normal. There is no tenderness.  Skin: Skin is warm  and dry. Rash (ezcema) noted.  Psychiatric: He has a normal mood and affect. He expresses no homicidal and no suicidal ideation. He expresses no suicidal plans and no homicidal plans.  Nursing note and vitals reviewed.  Assessment & Plan:   Problem List Items Addressed This Visit      Cardiovascular and Mediastinum   HTN (hypertension) - Primary   Relevant Medications   metoprolol tartrate (LOPRESSOR) 25 MG tablet   lisinopril (PRINIVIL,ZESTRIL) 2.5 MG tablet   Other Relevant Orders   Lipid Panel   POCT UA - Microalbumin (Completed)    Other Visit Diagnoses    History of alcohol dependence ([place])       Relevant Medications   Multiple Vitamin (MULTIVITAMIN WITH MINERALS) TABS tablet   thiamine 578 MG tablet   folic acid (FOLVITE) 1 MG tablet   Screening for colon cancer       Relevant Orders   Ambulatory referral to Gastroenterology   History of anemia       Relevant Orders   CBC with Differential   History of hyperkalemia       Relevant Orders   Basic metabolic panel   Urine test positive for microalbuminuria       Relevant Medications   lisinopril (PRINIVIL,ZESTRIL) 2.5 MG tablet      Meds ordered this encounter  Medications  . DISCONTD: metoprolol tartrate (LOPRESSOR) 25 MG tablet    Sig: Take 1 tablet (25 mg total) by mouth daily.    Dispense:  30 tablet    Refill:  2    Order Specific Question:   Supervising Provider    Answer:   Tresa Garter [id number]  . DISCONTD: folic acid (FOLVITE) 1 MG tablet    Sig: Take 1 tablet (1 mg total) by mouth daily.    Dispense:  30 tablet    Refill:  6    Order Specific Question:   Supervising Provider    Answer:   Tresa Garter [id number]  . DISCONTD: thiamine 100 MG tablet    Sig: Take 1 tablet (100 mg total) by mouth daily.    Dispense:  30 tablet    Refill:  6    Order Specific Question:   Supervising Provider    Answer:   Tresa Garter [id number]  . Multiple Vitamin (MULTIVITAMIN WITH MINERALS) TABS  tablet    Sig: Take 1 tablet by mouth daily.    Dispense:  30 tablet    Refill:  6    Order Specific Question:   Supervising Provider    Answer:   Tresa Garter [id number]  . DISCONTD: lisinopril (PRINIVIL,ZESTRIL) 2.5 MG tablet    Sig: Take 1 tablet (2.5 mg total) by mouth daily.    Dispense:  30 tablet    Refill:  2    Order Specific Question:   Supervising Provider    Answer:   Tresa Garter [id number]  . DISCONTD: metoprolol tartrate (LOPRESSOR) 25 MG tablet    Sig: Take 1  tablet (25 mg total) by mouth daily.    Dispense:  30 tablet    Refill:  2    Order Specific Question:   Supervising Provider    Answer:   Tresa Garter [id number]  . DISCONTD: thiamine 100 MG tablet    Sig: Take 1 tablet (100 mg total) by mouth daily.    Dispense:  30 tablet    Refill:  6    Order Specific Question:   Supervising Provider    Answer:   Tresa Garter [id number]  . DISCONTD: folic acid (FOLVITE) 1 MG tablet    Sig: Take 1 tablet (1 mg total) by mouth daily.    Dispense:  30 tablet    Refill:  6    Order Specific Question:   Supervising Provider    Answer:   Tresa Garter [id number]  . DISCONTD: lisinopril (PRINIVIL,ZESTRIL) 2.5 MG tablet    Sig: Take 1 tablet (2.5 mg total) by mouth daily.    Dispense:  30 tablet    Refill:  2    Order Specific Question:   Supervising Provider    Answer:   Tresa Garter [id number]  . metoprolol tartrate (LOPRESSOR) 25 MG tablet    Sig: Take 1 tablet (25 mg total) by mouth daily.    Dispense:  30 tablet    Refill:  2    Order Specific Question:   Supervising Provider    Answer:   Tresa Garter [id number]  . lisinopril (PRINIVIL,ZESTRIL) 2.5 MG tablet    Sig: Take 1 tablet (2.5 mg total) by mouth daily.    Dispense:  30 tablet    Refill:  2    Order Specific Question:   Supervising Provider    Answer:   Tresa Garter [id number]  . thiamine 100 MG tablet    Sig: Take 1 tablet (100 mg total) by mouth  daily.    Dispense:  30 tablet    Refill:  6    Order Specific Question:   Supervising Provider    Answer:   Tresa Garter [id number]  . folic acid (FOLVITE) 1 MG tablet    Sig: Take 1 tablet (1 mg total) by mouth daily.    Dispense:  30 tablet    Refill:  6    Order Specific Question:   Supervising Provider    Answer:   Tresa Garter [[id number]]    Follow-up: Return in about 1 month (around 04/08/2017) for Physical.   Alfonse Spruce FNP

## 2017-03-08 NOTE — Patient Instructions (Addendum)
Hypertension Hypertension is another name for high blood pressure. High blood pressure forces your heart to work harder to pump blood. This can cause problems over time. There are two numbers in a blood pressure reading. There is a top number (systolic) over a bottom number (diastolic). It is best to have a blood pressure below 120/80. Healthy choices can help lower your blood pressure. You may need medicine to help lower your blood pressure if:  Your blood pressure cannot be lowered with healthy choices.  Your blood pressure is higher than 130/80.  Follow these instructions at home: Eating and drinking  If directed, follow the DASH eating plan. This diet includes: ? Filling half of your plate at each meal with fruits and vegetables. ? Filling one quarter of your plate at each meal with whole grains. Whole grains include whole wheat pasta, brown rice, and whole grain bread. ? Eating or drinking low-fat dairy products, such as skim milk or low-fat yogurt. ? Filling one quarter of your plate at each meal with low-fat (lean) proteins. Low-fat proteins include fish, skinless chicken, eggs, beans, and tofu. ? Avoiding fatty meat, cured and processed meat, or chicken with skin. ? Avoiding premade or processed food.  Eat less than 1,500 mg of salt (sodium) a day.  Limit alcohol use to no more than 1 drink a day for nonpregnant women and 2 drinks a day for men. One drink equals 12 oz of beer, 5 oz of wine, or 1 oz of hard liquor. Lifestyle  Work with your doctor to stay at a healthy weight or to lose weight. Ask your doctor what the best weight is for you.  Get at least 30 minutes of exercise that causes your heart to beat faster (aerobic exercise) most days of the week. This may include walking, swimming, or biking.  Get at least 30 minutes of exercise that strengthens your muscles (resistance exercise) at least 3 days a week. This may include lifting weights or pilates.  Do not use any  products that contain nicotine or tobacco. This includes cigarettes and e-cigarettes. If you need help quitting, ask your doctor.  Check your blood pressure at home as told by your doctor.  Keep all follow-up visits as told by your doctor. This is important. Medicines  Take over-the-counter and prescription medicines only as told by your doctor. Follow directions carefully.  Do not skip doses of blood pressure medicine. The medicine does not work as well if you skip doses. Skipping doses also puts you at risk for problems.  Ask your doctor about side effects or reactions to medicines that you should watch for. Contact a doctor if:  You think you are having a reaction to the medicine you are taking.  You have headaches that keep coming back (recurring).  You feel dizzy.  You have swelling in your ankles.  You have trouble with your vision. Get help right away if:  You get a very bad headache.  You start to feel confused.  You feel weak or numb.  You feel faint.  You get very bad pain in your: ? Chest. ? Belly (abdomen).  You throw up (vomit) more than once.  You have trouble breathing. Summary  Hypertension is another name for high blood pressure.  Making healthy choices can help lower blood pressure. If your blood pressure cannot be controlled with healthy choices, you may need to take medicine. This information is not intended to replace advice given to you by your health care   provider. Make sure you discuss any questions you have with your health care provider. Document Released: 12/16/2007 Document Revised: 05/27/2016 Document Reviewed: 05/27/2016 Elsevier Interactive Patient Education  2018 [facility] Test Why am I having this test? Albumin is a protein in your body that helps regulate how much water is in your blood. As your kidneys filter your blood to get rid of waste products through your urine, albumin should remain in your bloodstream.  However, certain types of kidney disease can cause albumin to move from damaged blood vessels inside your kidneys and into your urine. When this happens, small amounts of albumin (microalbumin, MA) can be detected in your urine. This type of kidney damage is a common complication of diabetes mellitus, especially when blood sugar (glucose) has not been well controlled. The MA test may also help your health care provider diagnose other related medical conditions such as cardiovascular disease and high blood pressure. The MA test is often performed along with calculating urine creatinine levels. Creatinine is another waste product filtered out of your blood by your kidneys. You may have this test if:  You have diabetes and are showing signs of kidney damage.  Your health care provider wants to determine how well your blood glucose has been controlled over many years.  Your health care provider wants to determine your kidney function when other related tests are normal.  What kind of sample is taken? A urine sample is collected in a sterile container given to you by the lab. What are the reference values? Reference valuesare considered healthy valuesestablished after testing a large group of healthy people. Reference values may vary among different people, labs, and hospitals. It is your responsibility to obtain your test results. Ask the lab or department performing the test when and how you will get your results. A reference value for MA is any value less than 2 mg/L. A reference value for MA compared to creatinine is:  Men: less than 17 mg/g creatinine.  Women: less than 25 mg/g creatinine.  What do the results mean? MA test results that are higher than the reference values may indicate many health conditions. These may include:  Diabetes mellitus.  Poorly controlled diabetes mellitus.  Myoglobulinuria.  Hemoglobinuria.  Bence-Jones proteinuria.  Use of medicines or drugs that are  damaging to the kidneys.  Atherosclerosis.  Blood fat (lipid) abnormalities.  Insulin resistance.  High blood pressure.  Heart attack.  Talk with your health care provider to discuss your results, treatment options, and if necessary, the need for more tests. Talk with your health care provider if you have any questions about your results. Talk with your health care provider to discuss your results, treatment options, and if necessary, the need for more tests. Talk with your health care provider if you have any questions about your results. This information is not intended to replace advice given to you by your health care provider. Make sure you discuss any questions you have with your health care provider. Document Released: 08/01/2004 Document Revised: 03/04/2016 Document Reviewed: 11/17/2013 Elsevier Interactive Patient Education  2018 [employer].

## 2017-03-09 LAB — LIPID PANEL
CHOLESTEROL TOTAL: 171 mg/dL (ref 100–199)
Chol/HDL Ratio: 2 ratio (ref 0.0–5.0)
HDL: 86 mg/dL (ref >39)
LDL Calculated: 69 mg/dL (ref 0–99)
Triglycerides: 79 mg/dL (ref 0–149)
VLDL Cholesterol Cal: 16 mg/dL (ref 5–40)

## 2017-03-09 LAB — BASIC METABOLIC PANEL
BUN/Creatinine Ratio: 22 (ref 10–24)
BUN: 22 mg/dL (ref 8–27)
CHLORIDE: 105 mmol/L (ref 96–106)
CO2: 19 mmol/L — AB (ref 20–29)
CREATININE: 0.98 mg/dL (ref 0.76–1.27)
Calcium: 9.4 mg/dL (ref 8.6–10.2)
GFR calc Af Amer: 93 mL/min/{1.73_m2} (ref >59)
GFR calc non Af Amer: 81 mL/min/{1.73_m2} (ref >59)
GLUCOSE: 85 mg/dL (ref 65–99)
Potassium: 4 mmol/L (ref 3.5–5.2)
Sodium: 139 mmol/L (ref 134–144)

## 2017-03-09 LAB — CBC WITH DIFFERENTIAL/PLATELET
BASOS ABS: 0 10*3/uL (ref 0.0–0.2)
Basos: 1 %
EOS (ABSOLUTE): 0.3 10*3/uL (ref 0.0–0.4)
Eos: 5 %
Hematocrit: 34.5 % — ABNORMAL LOW (ref 37.5–51.0)
Hemoglobin: 11.2 g/dL — ABNORMAL LOW (ref 13.0–17.7)
IMMATURE GRANS (ABS): 0 10*3/uL (ref 0.0–0.1)
IMMATURE GRANULOCYTES: 0 %
LYMPHS: 20 %
Lymphocytes Absolute: 1.1 10*3/uL (ref 0.7–3.1)
MCH: 28.4 pg (ref 26.6–33.0)
MCHC: 32.5 g/dL (ref 31.5–35.7)
MCV: 87 fL (ref 79–97)
MONOS ABS: 0.6 10*3/uL (ref 0.1–0.9)
Monocytes: 10 %
NEUTROS PCT: 64 %
Neutrophils Absolute: 3.5 10*3/uL (ref 1.4–7.0)
PLATELETS: 374 10*3/uL (ref 150–379)
RBC: 3.95 x10E6/uL — ABNORMAL LOW (ref 4.14–5.80)
RDW: 17.4 % — AB (ref 12.3–15.4)
WBC: 5.4 10*3/uL (ref 3.4–10.8)

## 2017-03-16 ENCOUNTER — Other Ambulatory Visit: Payer: Self-pay | Admitting: Family Medicine

## 2017-03-16 ENCOUNTER — Telehealth: Payer: Self-pay

## 2017-03-16 DIAGNOSIS — D649 Anemia, unspecified: Secondary | ICD-10-CM

## 2017-03-16 NOTE — Telephone Encounter (Signed)
CMA call regarding lab results  Patient did not answer & unable to leave message  

## 2017-03-16 NOTE — Telephone Encounter (Signed)
-----   Message from Alfonse Spruce, [place] sent at 03/16/2017  6:53 AM EDT ----- Anemia has improved. Recommend referral for colonoscopy. Recommend level recheck in 3 months. Kidney function normal Labs normal. Cholesterol levels normal. Start eating a diet low in saturated fat. Limit your intake of fried foods, red meats, and whole milk. Increase activity.

## 2017-03-18 ENCOUNTER — Encounter: Payer: Self-pay | Admitting: Gastroenterology

## 2017-03-25 DIAGNOSIS — L2084 Intrinsic (allergic) eczema: Secondary | ICD-10-CM | POA: Diagnosis not present

## 2017-05-11 ENCOUNTER — Ambulatory Visit (INDEPENDENT_AMBULATORY_CARE_PROVIDER_SITE_OTHER): Payer: Medicare HMO | Admitting: Gastroenterology

## 2017-05-11 ENCOUNTER — Encounter: Payer: Self-pay | Admitting: Gastroenterology

## 2017-05-11 VITALS — BP 130/80 | HR 78 | Ht 69.0 in | Wt 202.4 lb

## 2017-05-11 DIAGNOSIS — D649 Anemia, unspecified: Secondary | ICD-10-CM | POA: Diagnosis not present

## 2017-05-11 DIAGNOSIS — Z1211 Encounter for screening for malignant neoplasm of colon: Secondary | ICD-10-CM

## 2017-05-11 DIAGNOSIS — Z800 Family history of malignant neoplasm of digestive organs: Secondary | ICD-10-CM | POA: Diagnosis not present

## 2017-05-11 MED ORDER — SUPREP BOWEL PREP KIT 17.5-3.13-1.6 GM/177ML PO SOLN: ORAL | 0 refills | Status: DC

## 2017-05-11 MED ORDER — POLYETHYLENE GLYCOL 3350 17 GM/SCOOP PO POWD: 1.0000 | Freq: Every day | ORAL | 3 refills | Status: DC | PRN

## 2017-05-11 MED ORDER — FERROUS SULFATE 325 (65 FE) MG PO TABS: 325.0000 mg | ORAL_TABLET | Freq: Two times a day (BID) | ORAL | 3 refills | Status: DC

## 2017-05-11 NOTE — Patient Instructions (Addendum)
If you are age 65 or older, your body mass index should be between 23-30. Your Body mass index is 29.89 kg/m. If this is out of the aforementioned range listed, please consider follow up with your Primary Care Provider.  If you are age 70 or younger, your body mass index should be between 19-25. Your Body mass index is 29.89 kg/m. If this is out of the aformentioned range listed, please consider follow up with your Primary Care Provider.   You have been scheduled for an endoscopy and colonoscopy. Please follow the written instructions given to you at your visit today. Please pick up your prep supplies at the pharmacy within the next 1-3 days. If you use inhalers (even only as needed), please bring them with you on the day of your procedure. Your physician has requested that you go to www.startemmi.com and enter the access code given to you at your visit today. This web site gives a general overview about your procedure. However, you should still follow specific instructions given to you by our office regarding your preparation for the procedure.  We have sent the following medications to your pharmacy for you to pick up at your convenience: Ferrous sulfate  Please purchase Miralax over the counter and take as needed for constipation.   Thank you.

## 2017-05-11 NOTE — Progress Notes (Signed)
HPI :  66 y/o male with a history of severe eczema, hypertension , anemia, here for a new patient visit at the request of [place].   Per review of records, the patient has a normocytic anemia dating back to July 2015. MCV between 80s and 90s.  Hgb 03/08/17 - Hgb of 11.2, MCV 80 July labs - iron level of 7, TIBC 101, iron sat 7%, ferritin 178 B12 / folate normal  He denies any blood in the stools, denies any abdominal pains. He has rare constipation but normally has regular bowels. Denies any weight loss. Eats well, he has no nausea or vomiting. He denies reflux symptoms or any dysphagia. He thinks his brother had gastric cancer, sister had ovarian cancer. He denies any family history of colon cancer. He has never had a prior upper endoscopy. He endorses a remote colonoscopy several years ago, can't remember when it was or what was noted, told he needed another one now. He has remotely seen rheumatology for positive ANA in joint pains, no records of that visit available. His Dermatologist is hoping to start him on [place] soon.   Past Medical History:  Diagnosis Date  . Anemia   . Eczema   . Fluid collection (edema) in the arms, legs, hands and feet   . Hypertension      Past Surgical History:  Procedure Laterality Date  . I&D EXTREMITY Right 09/13/2012   Procedure: IRRIGATION AND DEBRIDEMENT EXTREMITY  RIGHT MIDDLE FINGER WITH REVISION AMPUTATION AND SKIN GRAFTING.;  Surgeon: Roseanne Kaufman, MD;  Location: [facility];  Service: Orthopedics;  Laterality: Right;   Family History  Problem Relation Age of Onset  . Lupus Daughter    Social History  Substance Use Topics  . Smoking status: Never Smoker  . Smokeless tobacco: Never Used  . Alcohol use Yes     Comment: beer everyday   Current Outpatient Prescriptions  Medication Sig Dispense Refill  . bisacodyl (DULCOLAX) 5 MG EC tablet Take 1 tablet (5 mg total) by mouth daily as needed for moderate constipation. 30 tablet 0    . diphenhydrAMINE (BENADRYL) 25 mg capsule Take 1 capsule (25 mg total) by mouth every 6 (six) hours as needed for itching. 30 capsule 0  . folic acid (FOLVITE) 1 MG tablet Take 1 tablet (1 mg total) by mouth daily. 30 tablet 6  . lisinopril (PRINIVIL,ZESTRIL) 2.5 MG tablet Take 1 tablet (2.5 mg total) by mouth daily. 30 tablet 2  . metoprolol tartrate (LOPRESSOR) 25 MG tablet Take 1 tablet (25 mg total) by mouth daily. 30 tablet 2  . Multiple Vitamin (MULTIVITAMIN WITH MINERALS) TABS tablet Take 1 tablet by mouth daily. 30 tablet 6  . senna-docusate (SENOKOT-S) 8.6-50 MG tablet Take 1 tablet by mouth at bedtime as needed for mild constipation. 30 tablet 0  . thiamine 100 MG tablet Take 1 tablet (100 mg total) by mouth daily. 30 tablet 6   No current facility-administered medications for this visit.    No Known Allergies   Review of Systems: All systems reviewed and negative except where noted in HPI.   Lab Results  Component Value Date   WBC 5.4 03/08/2017   HGB 11.2 (L) 03/08/2017   HCT 34.5 (L) 03/08/2017   MCV 87 03/08/2017   PLT 374 03/08/2017    Lab Results  Component Value Date   CREATININE 0.98 03/08/2017   BUN 22 03/08/2017   NA 139 03/08/2017   K 4.0 03/08/2017  CL 105 03/08/2017   CO2 19 (L) 03/08/2017    Lab Results  Component Value Date   ALT 10 (L) 01/24/2017   AST 10 (L) 01/24/2017   ALKPHOS 64 01/24/2017   BILITOT 0.6 01/24/2017   Lab Results  Component Value Date   IRON 7 (L) 01/25/2017   TIBC 101 (L) 01/25/2017   FERRITIN 178 01/25/2017     Physical Exam: BP 130/80   Pulse 78   Ht 5\' 9"  (1.753 m)   Wt 202 lb 6.4 oz (91.8 kg)   SpO2 98%   BMI 29.89 kg/m  Constitutional: Pleasant,well-developed, male in no acute distress. HEENT: Normocephalic and atraumatic. Conjunctivae are normal. No scleral icterus. Neck supple.  Cardiovascular: Normal rate, regular rhythm.  Pulmonary/chest: Effort normal and breath sounds normal. No wheezing, rales  or rhonchi. Abdominal: Soft, nondistended, nontender. There are no masses palpable. No hepatomegaly. Extremities: no edema Lymphadenopathy: No cervical adenopathy noted. Neurological: Alert and oriented to person place and time. Skin: Skin is warm and dry. Eczema on hands and head noted Psychiatric: Normal mood and affect. Behavior is normal.   ASSESSMENT AND PLAN: 66 year old male referred for new patient evaluation to discuss the following issues:  Anemia - present since 2015, iron studies are most consistent with anemia of chronic disease, although unclear specifically what is driving this issue. He denies any overt GI blood loss. Given his family history of stomach cancer and his need for colon cancer screening, I offered him an EGD and colonoscopy for these issues and will evaluate to ensure no luminal pathology to account for anemia. In the interim we'll start him on some iron as his iron saturation is quite low and see if this helps his anemia. He can use MiraLAX as needed if he becomes constipated on iron. Following discussion of the risks and benefits of EGD and colonoscopy he wished to proceed as outlined.   Family history of gastric cancer - Brother had it, unclear what age diagnosed, I offered him an upper endoscopy given his anemia as above.   Colon cancer screening - no records of were this was done or when, I offered him a screening colonoscopy and he wanted to proceed as above.   Further recommendations pending the results of these exams in his course.  Nanafalia Cellar, MD [facility] Pager [phone]  CC: Orlando Penner*

## 2017-05-14 ENCOUNTER — Encounter: Payer: Self-pay | Admitting: Gastroenterology

## 2017-05-14 ENCOUNTER — Other Ambulatory Visit (HOSPITAL_COMMUNITY)
Admission: RE | Admit: 2017-05-14 | Discharge: 2017-05-14 | Disposition: A | Discharge status: Home / Self Care | Payer: Medicare HMO | Source: Ambulatory Visit | Attending: Gastroenterology | Admitting: Gastroenterology

## 2017-05-14 ENCOUNTER — Ambulatory Visit (AMBULATORY_SURGERY_CENTER): Payer: Medicare HMO | Admitting: Gastroenterology

## 2017-05-14 VITALS — BP 152/80 | HR 75 | Temp 98.0°F | Resp 16 | Ht 69.0 in | Wt 202.0 lb

## 2017-05-14 DIAGNOSIS — D649 Anemia, unspecified: Secondary | ICD-10-CM

## 2017-05-14 DIAGNOSIS — D120 Benign neoplasm of cecum: Secondary | ICD-10-CM

## 2017-05-14 DIAGNOSIS — Z1211 Encounter for screening for malignant neoplasm of colon: Secondary | ICD-10-CM | POA: Diagnosis not present

## 2017-05-14 DIAGNOSIS — Z800 Family history of malignant neoplasm of digestive organs: Secondary | ICD-10-CM | POA: Diagnosis not present

## 2017-05-14 DIAGNOSIS — K229 Disease of esophagus, unspecified: Secondary | ICD-10-CM

## 2017-05-14 DIAGNOSIS — B9681 Helicobacter pylori [H. pylori] as the cause of diseases classified elsewhere: Secondary | ICD-10-CM | POA: Diagnosis not present

## 2017-05-14 DIAGNOSIS — D125 Benign neoplasm of sigmoid colon: Secondary | ICD-10-CM

## 2017-05-14 DIAGNOSIS — D128 Benign neoplasm of rectum: Secondary | ICD-10-CM | POA: Diagnosis not present

## 2017-05-14 DIAGNOSIS — K2950 Unspecified chronic gastritis without bleeding: Secondary | ICD-10-CM | POA: Diagnosis not present

## 2017-05-14 DIAGNOSIS — B379 Candidiasis, unspecified: Secondary | ICD-10-CM | POA: Diagnosis not present

## 2017-05-14 MED ORDER — SODIUM CHLORIDE 0.9 % IV SOLN: 500.0000 mL | INTRAVENOUS | Status: DC

## 2017-05-14 NOTE — Progress Notes (Signed)
Called to room to assist during endoscopic procedure.  Patient ID and intended procedure confirmed with present staff. Received instructions for my participation in the procedure from the performing physician.  

## 2017-05-14 NOTE — Progress Notes (Signed)
A and O x3. Report to RN. Tolerated MAC anesthesia well.Teeth unchanged after procedure.

## 2017-05-14 NOTE — Patient Instructions (Addendum)
YOU HAD AN ENDOSCOPIC PROCEDURE TODAY AT [facility]:   Refer to the procedure report that was given to you for any specific questions about what was found during the examination.  If the procedure report does not answer your questions, please call your gastroenterologist to clarify.  If you requested that your care partner not be given the details of your procedure findings, then the procedure report has been included in a sealed envelope for you to review at your convenience later.  YOU SHOULD EXPECT: Some feelings of bloating in the abdomen. Passage of more gas than usual.  Walking can help get rid of the air that was put into your GI tract during the procedure and reduce the bloating. If you had a lower endoscopy (such as a colonoscopy or flexible sigmoidoscopy) you may notice spotting of blood in your stool or on the toilet paper. If you underwent a bowel prep for your procedure, you may not have a normal bowel movement for a few days.  Please Note:  You might notice some irritation and congestion in your nose or some drainage.  This is from the oxygen used during your procedure.  There is no need for concern and it should clear up in a day or so.  SYMPTOMS TO REPORT IMMEDIATELY:   Following lower endoscopy (colonoscopy or flexible sigmoidoscopy):  Excessive amounts of blood in the stool  Significant tenderness or worsening of abdominal pains  Swelling of the abdomen that is new, acute  Fever of 100F or higher   Following upper endoscopy (EGD)  Vomiting of blood or coffee ground material  New chest pain or pain under the shoulder blades  Painful or persistently difficult swallowing  New shortness of breath  Fever of 100F or higher  Black, tarry-looking stools  For urgent or emergent issues, a gastroenterologist can be reached at any hour by calling [phone].   DIET:  We do recommend a small meal at first, but then you may proceed to your regular diet.  Drink  plenty of fluids but you should avoid alcoholic beverages for 24 hours.  ACTIVITY:  You should plan to take it easy for the rest of today and you should NOT DRIVE or use heavy machinery until tomorrow (because of the sedation medicines used during the test).    FOLLOW UP: Our staff will call the number listed on your records the next business day following your procedure to check on you and address any questions or concerns that you may have regarding the information given to you following your procedure. If we do not reach you, we will leave a message.  However, if you are feeling well and you are not experiencing any problems, there is no need to return our call.  We will assume that you have returned to your regular daily activities without incident.  If any biopsies were taken you will be contacted by phone or by letter within the next 1-3 weeks.  Please call us at [phone] if you have not heard about the biopsies in 3 weeks.   Await for biopsy results to determine next repeat Colonoscopy screening Polyps (handout given) Hemorrhoids (handout given) Diverticulosis (handout given) .Marland KitchenNo ibuprofen, naproxen or other non-steroidal anti-inflammatory drugs for 2 weeks after polyp removal. Tylenol okay if needed.  SIGNATURES/CONFIDENTIALITY: You and/or your care partner have signed paperwork which will be entered into your electronic medical record.  These signatures attest to the fact that that the information above on your  After Visit Summary has been reviewed and is understood.  Full responsibility of the confidentiality of this discharge information lies with you and/or your care-partner.

## 2017-05-14 NOTE — Op Note (Signed)
[place] Patient Name: Raymond Stone Procedure Date: 05/14/2017 2:01 PM MRN: [id number] Endoscopist: Remo Lipps P. Armbruster MD, MD Age: 66 Referring MD:  Date of Birth: Aug 19, 1950 Gender: Male Account #: [id number] Procedure:                Colonoscopy Indications:              anemia of unclear etiology, screening Medicines:                Monitored Anesthesia Care Procedure:                Pre-Anesthesia Assessment:                           - Prior to the procedure, a History and Physical                            was performed, and patient medications and                            allergies were reviewed. The patient's tolerance of                            previous anesthesia was also reviewed. The risks                            and benefits of the procedure and the sedation                            options and risks were discussed with the patient.                            All questions were answered, and informed consent                            was obtained. Prior Anticoagulants: The patient has                            taken no previous anticoagulant or antiplatelet                            agents. ASA Grade Assessment: III - A patient with                            severe systemic disease. After reviewing the risks                            and benefits, the patient was deemed in                            satisfactory condition to undergo the procedure.                           After obtaining informed consent, the colonoscope  was passed under direct vision. Throughout the                            procedure, the patient's blood pressure, pulse, and                            oxygen saturations were monitored continuously. The                            Model CF-HQ190L [id number]) scope was introduced                            through the anus and advanced to the the cecum,                            identified by  appendiceal orifice and ileocecal                            valve. The colonoscopy was performed without                            difficulty. The patient tolerated the procedure                            well. The quality of the bowel preparation was                            adequate. The ileocecal valve, appendiceal orifice,                            and rectum were photographed. Scope In: 2:24:30 PM Scope Out: 2:39:25 PM Scope Withdrawal Time: 0 hours 13 minutes 39 seconds  Total Procedure Duration: 0 hours 14 minutes 55 seconds  Findings:                 The perianal and digital rectal examinations were                            normal.                           A diminutive polyp was found in the cecum. The                            polyp was sessile. The polyp was removed with a                            cold biopsy forceps. Resection and retrieval were                            complete.                           A 4 to 5 mm polyp was found in the sigmoid colon.  The polyp was sessile. The polyp was removed with a                            cold snare. Resection and retrieval were complete.                           A 4 mm polyp was found in the rectum. The polyp was                            sessile. The polyp was removed with a cold snare.                            Resection and retrieval were complete.                           Multiple small-mouthed diverticula were found in                            the entire colon.                           Internal hemorrhoids were found during retroflexion.                           The exam was otherwise without abnormality. Complications:            No immediate complications. Estimated blood loss:                            Minimal. Estimated Blood Loss:     Estimated blood loss was minimal. Impression:               - One diminutive polyp in the cecum, removed with a                             cold biopsy forceps. Resected and retrieved.                           - One 4 to 5 mm polyp in the sigmoid colon, removed                            with a cold snare. Resected and retrieved.                           - One 4 mm polyp in the rectum, removed with a cold                            snare. Resected and retrieved.                           - Diverticulosis in the entire examined colon.                           - Internal hemorrhoids.                           -  The examination was otherwise normal.                           No pathology noted to cause anemia on this exam.                            Anemia appears more so consistent with anemia of                            chronic disease and not from true iron deficiency. Recommendation:           - Patient has a contact number available for                            emergencies. The signs and symptoms of potential                            delayed complications were discussed with the                            patient. Return to normal activities tomorrow.                            Written discharge instructions were provided to the                            patient.                           - Resume previous diet.                           - Continue present medications.                           - Await pathology results.                           - Repeat colonoscopy is recommended for                            surveillance. The colonoscopy date will be                            determined after pathology results from today's                            exam become available for review.                           - No ibuprofen, naproxen, or other non-steroidal                            anti-inflammatory drugs for 2 weeks after polyp  removal. Remo Lipps P. Armbruster MD, MD 05/14/2017 2:44:08 PM This report has been signed electronically.

## 2017-05-14 NOTE — Op Note (Signed)
[place] Patient Name: Raymond Stone Procedure Date: 05/14/2017 2:01 PM MRN: [id number] Endoscopist: Remo Lipps P. Joseh Sjogren MD, MD Age: 66 Referring MD:  Date of Birth: [birth year] Gender: Male Account #: [id number] Procedure:                Upper GI endoscopy Indications:              Family history of gastric cancer (in brother),                            anemia of chronic disease Medicines:                Monitored Anesthesia Care Procedure:                Pre-Anesthesia Assessment:                           - Prior to the procedure, a History and Physical                            was performed, and patient medications and                            allergies were reviewed. The patient's tolerance of                            previous anesthesia was also reviewed. The risks                            and benefits of the procedure and the sedation                            options and risks were discussed with the patient.                            All questions were answered, and informed consent                            was obtained. Prior Anticoagulants: The patient has                            taken no previous anticoagulant or antiplatelet                            agents. ASA Grade Assessment: III - A patient with                            severe systemic disease. After reviewing the risks                            and benefits, the patient was deemed in                            satisfactory condition to undergo the procedure.  After obtaining informed consent, the endoscope was                            passed under direct vision. Throughout the                            procedure, the patient's blood pressure, pulse, and                            oxygen saturations were monitored continuously. The                            Model GIF-HQ190 [id number]) scope was introduced                            through the mouth, and  advanced to the second part                            of duodenum. The upper GI endoscopy was                            accomplished without difficulty. The patient                            tolerated the procedure well. Scope In: Scope Out: Findings:                 Esophagogastric landmarks were identified: the                            Z-line was found at 41 cm, the gastroesophageal                            junction was found at 41 cm and the upper extent of                            the gastric folds was found at 41 cm from the                            incisors.                           Multiple diminutive white plaques were found in the                            entire esophagus. Brushings for candidiasis were                            obtained in the entire esophagus.                           The exam of the esophagus was otherwise normal.                           Diffuse mild inflammation  characterized by adherent                            blood and erythema was found in the gastric fundus                            and in the gastric body. No ulcerations / erosions                            were noted. Biopsies were taken with a cold forceps                            from the antrum / body / incisura for Helicobacter                            pylori testing.                           The exam of the stomach was otherwise normal.                           The duodenal bulb and second portion of the                            duodenum were normal. Complications:            No immediate complications. Estimated blood loss:                            Minimal. Estimated Blood Loss:     Estimated blood loss was minimal. Impression:               - Esophagogastric landmarks identified.                           - Multiple white plaques in the esophagus.                            Brushings performed to rule out Candidiasis.                           - Gastritis.  Biopsied.                           - Normal duodenal bulb and second portion of the                            duodenum. Recommendation:           - Patient has a contact number available for                            emergencies. The signs and symptoms of potential                            delayed complications were discussed with the  patient. Return to normal activities tomorrow.                            Written discharge instructions were provided to the                            patient.                           - Resume previous diet.                           - Continue present medications.                           - Await pathology results. Remo Lipps P. Jakory Matsuo MD, MD 05/14/2017 2:48:43 PM This report has been signed electronically.

## 2017-05-14 NOTE — Progress Notes (Signed)
Cytology sample taken over to [facility] Cytology and given to [facility]

## 2017-05-17 ENCOUNTER — Ambulatory Visit: Payer: Medicare HMO | Attending: Family Medicine | Admitting: Family Medicine

## 2017-05-17 ENCOUNTER — Encounter: Payer: Self-pay | Admitting: Family Medicine

## 2017-05-17 ENCOUNTER — Telehealth: Payer: Self-pay

## 2017-05-17 VITALS — BP 144/72 | HR 80 | Temp 98.1°F | Resp 18 | Ht 69.0 in | Wt 207.8 lb

## 2017-05-17 DIAGNOSIS — F1021 Alcohol dependence, in remission: Secondary | ICD-10-CM | POA: Insufficient documentation

## 2017-05-17 DIAGNOSIS — Z79899 Other long term (current) drug therapy: Secondary | ICD-10-CM | POA: Diagnosis not present

## 2017-05-17 DIAGNOSIS — L309 Dermatitis, unspecified: Secondary | ICD-10-CM | POA: Diagnosis not present

## 2017-05-17 DIAGNOSIS — Z0000 Encounter for general adult medical examination without abnormal findings: Secondary | ICD-10-CM | POA: Diagnosis not present

## 2017-05-17 DIAGNOSIS — Z0001 Encounter for general adult medical examination with abnormal findings: Secondary | ICD-10-CM | POA: Diagnosis not present

## 2017-05-17 DIAGNOSIS — R69 Illness, unspecified: Secondary | ICD-10-CM | POA: Diagnosis not present

## 2017-05-17 DIAGNOSIS — F1020 Alcohol dependence, uncomplicated: Secondary | ICD-10-CM | POA: Diagnosis not present

## 2017-05-17 DIAGNOSIS — Z8041 Family history of malignant neoplasm of ovary: Secondary | ICD-10-CM | POA: Insufficient documentation

## 2017-05-17 DIAGNOSIS — Z800 Family history of malignant neoplasm of digestive organs: Secondary | ICD-10-CM | POA: Insufficient documentation

## 2017-05-17 DIAGNOSIS — Z23 Encounter for immunization: Secondary | ICD-10-CM | POA: Insufficient documentation

## 2017-05-17 DIAGNOSIS — Z760 Encounter for issue of repeat prescription: Secondary | ICD-10-CM | POA: Insufficient documentation

## 2017-05-17 DIAGNOSIS — I10 Essential (primary) hypertension: Secondary | ICD-10-CM | POA: Insufficient documentation

## 2017-05-17 DIAGNOSIS — Z888 Allergy status to other drugs, medicaments and biological substances status: Secondary | ICD-10-CM | POA: Insufficient documentation

## 2017-05-17 DIAGNOSIS — Z833 Family history of diabetes mellitus: Secondary | ICD-10-CM | POA: Insufficient documentation

## 2017-05-17 MED ORDER — ADULT MULTIVITAMIN W/MINERALS CH: 1.0000 | ORAL_TABLET | Freq: Every day | ORAL | 6 refills | Status: DC

## 2017-05-17 MED ORDER — LISINOPRIL 10 MG PO TABS: 10.0000 mg | ORAL_TABLET | Freq: Every day | ORAL | 5 refills | Status: DC

## 2017-05-17 MED ORDER — DESONIDE 0.05 % EX OINT: TOPICAL_OINTMENT | CUTANEOUS | 0 refills | Status: DC

## 2017-05-17 MED ORDER — METOPROLOL TARTRATE 25 MG PO TABS: 25.0000 mg | ORAL_TABLET | Freq: Every day | ORAL | 5 refills | Status: DC

## 2017-05-17 MED ORDER — THIAMINE HCL 100 MG PO TABS: 100.0000 mg | ORAL_TABLET | Freq: Every day | ORAL | 6 refills | Status: DC

## 2017-05-17 MED ORDER — FOLIC ACID 1 MG PO TABS: 1.0000 mg | ORAL_TABLET | Freq: Every day | ORAL | 6 refills | Status: DC

## 2017-05-17 NOTE — Progress Notes (Signed)
Subjective:   Patient ID: Raymond Stone, male    DOB: [birth year], 66 y.o.   MRN: [id number]  Chief Complaint  Patient presents with  . Annual Exam   HPI Raymond Stone 66 y.o. male presents comprehensive physical examination. PMH includes HTN, eczema, and ETOH dependence. He is not exercising and is not adherent to low salt diet.  He does not check BP at home. Cardiac symptoms none. History of alcohol dependence. Reports drinking 2-16 oz beers per day. He denies any anxiety or depression. He declines to speak with LCSW at this time. History of severe eczema reports receiving treatment from specialist. [place] DM-sister, cancer-sister (ovarian). Denies symptoms of all other pertinent systems.    Past Medical History:  Diagnosis Date  . Anemia   . Eczema   . Fluid collection (edema) in the arms, legs, hands and feet   . Hypertension     Past Surgical History:  Procedure Laterality Date  . COLONOSCOPY      Family History  Problem Relation Age of Onset  . Lupus Daughter   . Stomach cancer Brother   . Colon cancer Neg Hx   . Colon polyps Neg Hx   . Esophageal cancer Neg Hx   . Rectal cancer Neg Hx     Social History   Socioeconomic History  . Marital status: Single    Spouse name: Not on file  . Number of children: Not on file  . Years of education: Not on file  . Highest education level: Not on file  Social Needs  . Financial resource strain: Not on file  . Food insecurity - worry: Not on file  . Food insecurity - inability: Not on file  . Transportation needs - medical: Not on file  . Transportation needs - non-medical: Not on file  Occupational History  . Not on file  Tobacco Use  . Smoking status: Never Smoker  . Smokeless tobacco: Never Used  Substance and Sexual Activity  . Alcohol use: Yes    Alcohol/week: 1.8 - 2.4 oz    Types: 3 - 4 Cans of beer per week    Comment: beer everyday  . Drug use: No  . Sexual activity: Not on file  Other Topics Concern  . Not  on file  Social History Narrative  . Not on file    No facility-administered medications prior to visit.    Outpatient Medications Prior to Visit  Medication Sig Dispense Refill  . bisacodyl (DULCOLAX) 5 MG EC tablet Take 1 tablet (5 mg total) by mouth daily as needed for moderate constipation. 30 tablet 0  . diphenhydrAMINE (BENADRYL) 25 mg capsule Take 1 capsule (25 mg total) by mouth every 6 (six) hours as needed for itching. (Patient not taking: Reported on 05/21/2017) 30 capsule 0  . ferrous sulfate 325 (65 FE) MG tablet Take 1 tablet (325 mg total) by mouth 2 (two) times daily with a meal. (Patient taking differently: Take 325 mg daily with breakfast by mouth. ) 180 tablet 3  . polyethylene glycol powder (GLYCOLAX/MIRALAX) powder Take 255 g by mouth daily as needed. (Patient not taking: Reported on 05/21/2017) 255 g 3  . senna-docusate (SENOKOT-S) 8.6-50 MG tablet Take 1 tablet by mouth at bedtime as needed for mild constipation. (Patient not taking: Reported on 05/21/2017) 30 tablet 0  . triamcinolone ointment (KENALOG) 0.1 % Apply 0.1 application daily after lunch topically. For eczema on all areas except face    . desonide (DESOWEN) 0.05 % ointment  Apply 5.36 application topically 1 day or 1 dose.  0  . fluocinonide ointment (LIDEX) 0.05 % Apply 6.44 application topically 1 day or 1 dose. For ezcema    . folic acid (FOLVITE) 1 MG tablet Take 1 tablet (1 mg total) by mouth daily. 30 tablet 6  . lisinopril (PRINIVIL,ZESTRIL) 2.5 MG tablet Take 1 tablet (2.5 mg total) by mouth daily. 30 tablet 2  . metoprolol tartrate (LOPRESSOR) 25 MG tablet Take 1 tablet (25 mg total) by mouth daily. 30 tablet 2  . Multiple Vitamin (MULTIVITAMIN WITH MINERALS) TABS tablet Take 1 tablet by mouth daily. 30 tablet 6  . thiamine 100 MG tablet Take 1 tablet (100 mg total) by mouth daily. 30 tablet 6    Allergies  Allergen Reactions  . Lisinopril Swelling    ANGIOEDEMA    Review of Systems    Constitutional: Negative.   HENT: Negative.   Eyes: Negative.   Respiratory: Negative.   Cardiovascular: Negative.   Gastrointestinal: Negative.   Genitourinary: Negative.   Musculoskeletal: Negative.   Skin: Positive for rash (chronic ezcema ).  Neurological: Negative.   Endo/Heme/Allergies: Negative.   Psychiatric/Behavioral: Negative.        Objective:    Physical Exam  Constitutional: He is oriented to person, place, and time. He appears well-developed and well-nourished.  HENT:  Head: Normocephalic and atraumatic.  Right Ear: External ear normal.  Left Ear: External ear normal.  Nose: Nose normal.  Mouth/Throat: Oropharynx is clear and moist.  Eyes: Conjunctivae and EOM are normal. Pupils are equal, round, and reactive to light.  Neck: Normal range of motion. Neck supple.  Cardiovascular: Normal rate, regular rhythm, normal heart sounds and intact distal pulses.  Pulmonary/Chest: Effort normal and breath sounds normal.  Abdominal: Soft. Bowel sounds are normal. There is no tenderness.  Musculoskeletal: Normal range of motion.  Neurological: He is alert and oriented to person, place, and time. He has normal reflexes.  Skin: Skin is warm and dry. Rash (generalized, maculpapular rash with hyperpigmentation and lichenification) noted.  Psychiatric: He has a normal mood and affect. He expresses no homicidal and no suicidal ideation. He expresses no suicidal plans and no homicidal plans.  Nursing note and vitals reviewed.   BP (!) 144/72   Pulse 80   Temp 98.1 F (36.7 C) (Oral)   Resp 18   Ht 5\' 9"  (1.753 m)   Wt 207 lb 12.8 oz (94.3 kg)   SpO2 97%   BMI 30.69 kg/m  Wt Readings from Last 3 Encounters:  05/24/17 211 lb 3.2 oz (95.8 kg)  05/17/17 207 lb 12.8 oz (94.3 kg)  05/14/17 202 lb (91.6 kg)    Immunization History  Administered Date(s) Administered  . Influenza,inj,Quad PF,6+ Mos 05/17/2017    Diabetic Foot Exam - Simple   No data filed      Lab  Results  Component Value Date   TSH 1.310 05/17/2017   Lab Results  Component Value Date   WBC 11.0 (H) 05/24/2017   HGB 12.8 (L) 05/24/2017   HCT 38.5 (L) 05/24/2017   MCV 89.3 05/24/2017   PLT 292 05/24/2017   Lab Results  Component Value Date   NA 141 05/24/2017   K 3.5 05/24/2017   CO2 21 (L) 05/24/2017   GLUCOSE 125 (H) 05/24/2017   BUN 25 (H) 05/24/2017   CREATININE 1.32 (H) 05/24/2017   BILITOT <0.2 05/17/2017   ALKPHOS 79 05/17/2017   AST 21 05/17/2017   ALT 14  05/17/2017   PROT 6.9 05/17/2017   ALBUMIN 3.0 (L) 05/24/2017   CALCIUM 8.8 (L) 05/24/2017   ANIONGAP 9 05/24/2017   Lab Results  Component Value Date   CHOL 186 05/17/2017   CHOL 171 03/08/2017   Lab Results  Component Value Date   HDL 105 05/17/2017   HDL 86 03/08/2017   Lab Results  Component Value Date   LDLCALC 69 05/17/2017   LDLCALC 69 03/08/2017   Lab Results  Component Value Date   TRIG 58 05/17/2017   TRIG 79 03/08/2017   Lab Results  Component Value Date   CHOLHDL 1.8 05/17/2017   CHOLHDL 2.0 03/08/2017   Lab Results  Component Value Date   HGBA1C 6.0 (H) 05/22/2017   HGBA1C 5.8 01/31/2014       Assessment & Plan:   1. Annual physical exam   - CMP and Liver - PSA - TSH - Lipid Panel - Vitamin D, 25-hydroxy - CBC  2. Needs flu shot  - Flu Vaccine QUAD 6+ mos PF IM (Fluarix Quad PF)  3. Severe eczema  - desonide (DESOWEN) 0.05 % ointment; APPLY A SMALL AMOUNT TOPICALLY TWICE A DAY. FOR 14 DAYS.  Dispense: 15 g; Refill: 0  4. Essential hypertension Lisinopril added for better BP control. Schedule BP recheck in 1 weeks with nurse. - metoprolol tartrate (LOPRESSOR) 25 MG tablet; Take 1 tablet (25 mg total) daily by mouth.  Dispense: 30 tablet; Refill: 5 - lisinopril (PRINIVIL,ZESTRIL) 10 MG tablet; Take 1 tablet (10 mg total) daily by mouth.  Dispense: 30 tablet; Refill: 5  5. Medication refill  - folic acid (FOLVITE) 1 MG tablet; Take 1 tablet (1 mg total)  daily by mouth.  Dispense: 30 tablet; Refill: 6 - Multiple Vitamin (MULTIVITAMIN WITH MINERALS) TABS tablet; Take 1 tablet daily by mouth.  Dispense: 30 tablet; Refill: 6 - thiamine 100 MG tablet; Take 1 tablet (100 mg total) daily by mouth.  Dispense: 30 tablet; Refill: 6   Follow up: Return in about 1 week (around 05/24/2017) for BP check with [place].    Fredia Beets, FNP

## 2017-05-17 NOTE — Patient Instructions (Signed)

## 2017-05-17 NOTE — Telephone Encounter (Signed)
  Follow up Call-  Call Raymond Stone number 05/14/2017  Post procedure Call [name] phone  # [phone]  Permission to leave phone message Yes  Some recent data might be hidden     Patient questions:  Do you have a fever, pain , or abdominal swelling? No. Pain Score  0 *  Have you tolerated food without any problems? Yes.    Have you been able to return to your normal activities? Yes.    Do you have any questions about your discharge instructions: Diet   No. Medications  No. Follow up visit  No.  Do you have questions or concerns about your Care? No.  Actions: * If pain score is 4 or above: No action needed, pain <4.

## 2017-05-17 NOTE — Progress Notes (Signed)
Patient is here for physical.

## 2017-05-18 ENCOUNTER — Other Ambulatory Visit: Payer: Self-pay

## 2017-05-18 LAB — CBC
HEMATOCRIT: 32.8 % — AB (ref 37.5–51.0)
HEMOGLOBIN: 11.2 g/dL — AB (ref 13.0–17.7)
MCH: 29.5 pg (ref 26.6–33.0)
MCHC: 34.1 g/dL (ref 31.5–35.7)
MCV: 86 fL (ref 79–97)
PLATELETS: 405 10*3/uL — AB (ref 150–379)
RBC: 3.8 x10E6/uL — AB (ref 4.14–5.80)
RDW: 13.2 % (ref 12.3–15.4)
WBC: 7.1 10*3/uL (ref 3.4–10.8)

## 2017-05-18 LAB — TSH: TSH: 1.31 u[IU]/mL (ref 0.450–4.500)

## 2017-05-18 LAB — CMP AND LIVER
ALK PHOS: 79 IU/L (ref 39–117)
ALT: 14 IU/L (ref 0–44)
AST: 21 IU/L (ref 0–40)
Albumin: 3.8 g/dL (ref 3.6–4.8)
BILIRUBIN, DIRECT: 0.08 mg/dL (ref 0.00–0.40)
BUN: 23 mg/dL (ref 8–27)
CALCIUM: 9.3 mg/dL (ref 8.6–10.2)
CHLORIDE: 106 mmol/L (ref 96–106)
CO2: 20 mmol/L (ref 20–29)
Creatinine, Ser: 1.08 mg/dL (ref 0.76–1.27)
GFR calc non Af Amer: 72 mL/min/{1.73_m2} (ref >59)
GFR, EST AFRICAN AMERICAN: 83 mL/min/{1.73_m2} (ref >59)
Glucose: 99 mg/dL (ref 65–99)
Potassium: 4.6 mmol/L (ref 3.5–5.2)
Sodium: 143 mmol/L (ref 134–144)
Total Protein: 6.9 g/dL (ref 6.0–8.5)

## 2017-05-18 LAB — LIPID PANEL
CHOLESTEROL TOTAL: 186 mg/dL (ref 100–199)
Chol/HDL Ratio: 1.8 ratio (ref 0.0–5.0)
HDL: 105 mg/dL (ref >39)
LDL CALC: 69 mg/dL (ref 0–99)
TRIGLYCERIDES: 58 mg/dL (ref 0–149)
VLDL Cholesterol Cal: 12 mg/dL (ref 5–40)

## 2017-05-18 LAB — VITAMIN D 25 HYDROXY (VIT D DEFICIENCY, FRACTURES): Vit D, 25-Hydroxy: 11.3 ng/mL — ABNORMAL LOW (ref 30.0–100.0)

## 2017-05-18 LAB — PSA: PROSTATE SPECIFIC AG, SERUM: 0.3 ng/mL (ref 0.0–4.0)

## 2017-05-18 MED ORDER — FLUCONAZOLE 200 MG PO TABS: 200.0000 mg | ORAL_TABLET | ORAL | 0 refills | Status: DC

## 2017-05-21 ENCOUNTER — Encounter (HOSPITAL_COMMUNITY): Payer: Self-pay | Admitting: *Deleted

## 2017-05-21 ENCOUNTER — Inpatient Hospital Stay (HOSPITAL_COMMUNITY)
Admission: EM | Admit: 2017-05-21 | Discharge: 2017-05-26 | DRG: 915 | Disposition: A | Discharge status: Home / Self Care | Payer: Medicare HMO | Attending: Pulmonary Disease | Admitting: Pulmonary Disease

## 2017-05-21 DIAGNOSIS — R1310 Dysphagia, unspecified: Secondary | ICD-10-CM | POA: Diagnosis not present

## 2017-05-21 DIAGNOSIS — T783XXA Angioneurotic edema, initial encounter: Secondary | ICD-10-CM | POA: Diagnosis not present

## 2017-05-21 DIAGNOSIS — D649 Anemia, unspecified: Secondary | ICD-10-CM | POA: Diagnosis present

## 2017-05-21 DIAGNOSIS — Z4682 Encounter for fitting and adjustment of non-vascular catheter: Secondary | ICD-10-CM | POA: Diagnosis not present

## 2017-05-21 DIAGNOSIS — J9690 Respiratory failure, unspecified, unspecified whether with hypoxia or hypercapnia: Secondary | ICD-10-CM

## 2017-05-21 DIAGNOSIS — T380X5A Adverse effect of glucocorticoids and synthetic analogues, initial encounter: Secondary | ICD-10-CM | POA: Diagnosis present

## 2017-05-21 DIAGNOSIS — I517 Cardiomegaly: Secondary | ICD-10-CM | POA: Diagnosis not present

## 2017-05-21 DIAGNOSIS — L309 Dermatitis, unspecified: Secondary | ICD-10-CM | POA: Diagnosis present

## 2017-05-21 DIAGNOSIS — Z4659 Encounter for fitting and adjustment of other gastrointestinal appliance and device: Secondary | ICD-10-CM

## 2017-05-21 DIAGNOSIS — T464X5A Adverse effect of angiotensin-converting-enzyme inhibitors, initial encounter: Secondary | ICD-10-CM | POA: Diagnosis present

## 2017-05-21 DIAGNOSIS — Z888 Allergy status to other drugs, medicaments and biological substances status: Secondary | ICD-10-CM | POA: Diagnosis not present

## 2017-05-21 DIAGNOSIS — R739 Hyperglycemia, unspecified: Secondary | ICD-10-CM | POA: Diagnosis present

## 2017-05-21 DIAGNOSIS — I110 Hypertensive heart disease with heart failure: Secondary | ICD-10-CM | POA: Diagnosis present

## 2017-05-21 DIAGNOSIS — J384 Edema of larynx: Secondary | ICD-10-CM | POA: Diagnosis not present

## 2017-05-21 DIAGNOSIS — E876 Hypokalemia: Secondary | ICD-10-CM | POA: Diagnosis present

## 2017-05-21 DIAGNOSIS — Z0189 Encounter for other specified special examinations: Secondary | ICD-10-CM

## 2017-05-21 DIAGNOSIS — Z978 Presence of other specified devices: Secondary | ICD-10-CM

## 2017-05-21 DIAGNOSIS — J9601 Acute respiratory failure with hypoxia: Secondary | ICD-10-CM | POA: Diagnosis present

## 2017-05-21 DIAGNOSIS — I5032 Chronic diastolic (congestive) heart failure: Secondary | ICD-10-CM | POA: Diagnosis not present

## 2017-05-21 LAB — BASIC METABOLIC PANEL
ANION GAP: 8 (ref 5–15)
BUN: 23 mg/dL — ABNORMAL HIGH (ref 6–20)
CHLORIDE: 110 mmol/L (ref 101–111)
CO2: 21 mmol/L — AB (ref 22–32)
Calcium: 8.7 mg/dL — ABNORMAL LOW (ref 8.9–10.3)
Creatinine, Ser: 1.11 mg/dL (ref 0.61–1.24)
GFR calc non Af Amer: >60 mL/min (ref >60)
GLUCOSE: 79 mg/dL (ref 65–99)
POTASSIUM: 3.3 mmol/L — AB (ref 3.5–5.1)
Sodium: 139 mmol/L (ref 135–145)

## 2017-05-21 LAB — CBC WITH DIFFERENTIAL/PLATELET
BASOS ABS: 0 10*3/uL (ref 0.0–0.1)
Basophils Relative: 0 %
Eosinophils Absolute: 0.1 10*3/uL (ref 0.0–0.7)
Eosinophils Relative: 1 %
HEMATOCRIT: 35.8 % — AB (ref 39.0–52.0)
HEMOGLOBIN: 11.8 g/dL — AB (ref 13.0–17.0)
LYMPHS PCT: 17 %
Lymphs Abs: 1.2 10*3/uL (ref 0.7–4.0)
MCH: 29.6 pg (ref 26.0–34.0)
MCHC: 33 g/dL (ref 30.0–36.0)
MCV: 89.9 fL (ref 78.0–100.0)
MONO ABS: 0.7 10*3/uL (ref 0.1–1.0)
Monocytes Relative: 10 %
NEUTROS ABS: 5 10*3/uL (ref 1.7–7.7)
NEUTROS PCT: 72 %
Platelets: 435 10*3/uL — ABNORMAL HIGH (ref 150–400)
RBC: 3.98 MIL/uL — AB (ref 4.22–5.81)
RDW: 13 % (ref 11.5–15.5)
WBC: 6.9 10*3/uL (ref 4.0–10.5)

## 2017-05-21 MED ORDER — SODIUM CHLORIDE 0.9 % IV BOLUS (SEPSIS)
1000.0000 mL | Freq: Once | INTRAVENOUS | Status: AC
  Administered 2017-05-22: 1000 mL via INTRAVENOUS

## 2017-05-21 MED ORDER — DIPHENHYDRAMINE HCL 50 MG/ML IJ SOLN
25.0000 mg | Freq: Once | INTRAMUSCULAR | Status: DC
  Filled 2017-05-21: qty 1

## 2017-05-21 MED ORDER — EPINEPHRINE PF 1 MG/10ML IJ SOSY: 0.3000 mg | PREFILLED_SYRINGE | Freq: Once | INTRAMUSCULAR | Status: DC

## 2017-05-21 MED ORDER — EPINEPHRINE 0.3 MG/0.3ML IJ SOAJ
0.3000 mg | Freq: Once | INTRAMUSCULAR | Status: DC
  Filled 2017-05-21: qty 0.3

## 2017-05-21 MED ORDER — FAMOTIDINE IN NACL 20-0.9 MG/50ML-% IV SOLN: 20.0000 mg | Freq: Once | INTRAVENOUS | Status: DC

## 2017-05-21 MED ORDER — DIPHENHYDRAMINE HCL 50 MG/ML IJ SOLN
25.0000 mg | Freq: Once | INTRAMUSCULAR | Status: AC
  Administered 2017-05-21: 25 mg via INTRAVENOUS
  Filled 2017-05-21: qty 1

## 2017-05-21 MED ORDER — EPINEPHRINE 0.3 MG/0.3ML IJ SOAJ
INTRAMUSCULAR | Status: AC
  Filled 2017-05-21: qty 0.3

## 2017-05-21 MED ORDER — FAMOTIDINE IN NACL 20-0.9 MG/50ML-% IV SOLN
20.0000 mg | Freq: Once | INTRAVENOUS | Status: AC
  Administered 2017-05-21: 20 mg via INTRAVENOUS
  Filled 2017-05-21: qty 50

## 2017-05-21 MED ORDER — METHYLPREDNISOLONE SODIUM SUCC 125 MG IJ SOLR
125.0000 mg | Freq: Once | INTRAMUSCULAR | Status: AC
  Administered 2017-05-21: 125 mg via INTRAVENOUS
  Filled 2017-05-21: qty 2

## 2017-05-21 MED ORDER — EPINEPHRINE 0.3 MG/0.3ML IJ SOAJ
0.3000 mg | Freq: Once | INTRAMUSCULAR | Status: AC
  Administered 2017-05-21: 0.3 mg via INTRAMUSCULAR

## 2017-05-21 MED ORDER — DIPHENHYDRAMINE HCL 50 MG/ML IJ SOLN
INTRAMUSCULAR | Status: AC
  Administered 2017-05-21: 25 mg
  Filled 2017-05-21: qty 1

## 2017-05-21 NOTE — ED Notes (Signed)
Less swelling to facial features

## 2017-05-21 NOTE — ED Triage Notes (Signed)
Pt reports starting lisinopril this week, having swelling today to face and airway, reports difficulty swallowing.

## 2017-05-21 NOTE — ED Notes (Signed)
Pt about the same

## 2017-05-21 NOTE — ED Notes (Signed)
Face and lips swollen  Tongue not swollen  Alert no distress

## 2017-05-21 NOTE — ED Provider Notes (Signed)
[facility] EMERGENCY DEPARTMENT Provider Note  CSN: [id number] Arrival date & time: 05/21/17  1701 History   Chief Complaint Chief Complaint  Patient presents with  . Angioedema   HPI Raymond Stone is a 66 y.o. male with PMH of HTN who presented with significant facial swelling. The patient states he recently started lisinopril approximately 4 days ago for blood pressure. He denies any new food or any known allergies. He denies difficulty breathing or swallowing. He has had no changes in his voice. The patient denies that anything make his symptoms worse, nothing makes them better. Symptoms started about 1 hour ago and have been constant, and gradually worsening.   The history is provided by the patient.     Past Medical History:  Diagnosis Date  . Anemia   . Eczema   . Fluid collection (edema) in the arms, legs, hands and feet   . Hypertension    Patient Active Problem List   Diagnosis Date Noted  . History of alcohol dependence ([place]) 05/17/2017  . Leukocytosis 01/27/2017  . AKI (acute kidney injury) ([place]) 01/24/2017  . 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 Alcohol dependence ([place]) 01/24/2017  . Normocytic anemia 01/24/2017  . Thrombocytosis ([place]) 01/24/2017  . Chronic diastolic CHF (congestive heart failure) ([place]) 01/24/2017  . Scrotal edema 10/20/2016  . Nephrotic syndrome 10/20/2016  . HTN (hypertension) 01/07/2016  . Severe eczema 04/16/2014   Past Surgical History:  Procedure Laterality Date  . COLONOSCOPY      Home Medications    Prior to Admission medications   Medication Sig Start Date End Date Taking? Authorizing Provider  bisacodyl (DULCOLAX) 5 MG EC tablet Take 1 tablet (5 mg total) by mouth daily as needed for moderate constipation. 01/27/17  Yes Sheikh, Omair Latif, DO  desonide (DESOWEN) 0.05 % ointment APPLY A SMALL AMOUNT TOPICALLY TWICE A DAY. FOR 14 DAYS. Patient taking differently: Apply 1 application daily after lunch topically. For  eczema on face 05/17/17  Yes Hairston, Mandesia R, FNP  docusate sodium (COLACE) 100 MG capsule Take 100 mg 2 (two) times daily as needed by mouth for mild constipation.   Yes [provider]  ferrous sulfate 325 (65 FE) MG tablet Take 1 tablet (325 mg total) by mouth 2 (two) times daily with a meal. Patient taking differently: Take 325 mg daily with breakfast by mouth.  05/11/17  Yes Armbruster, Carlota Raspberry, MD  fluconazole (DIFLUCAN) 200 MG tablet Take 1 tablet (200 mg total) as directed by mouth. Take 2 tablets on day one, then 1 tablet daily thereafter. Patient taking differently: Take 200-400 mg See admin instructions by mouth. Take 2 tablets on day one, then 1 tablet daily for 2 weeks (#15 filled 05/18/17) 05/18/17  Yes Armbruster, Carlota Raspberry, MD  hydrOXYzine (ATARAX/VISTARIL) 25 MG tablet Take 25 mg 3 (three) times daily as needed by mouth for itching.   Yes [provider]  metoprolol tartrate (LOPRESSOR) 25 MG tablet Take 1 tablet (25 mg total) daily by mouth. 05/17/17  Yes Hairston, Maylon Peppers, FNP  Multiple Vitamin (MULTIVITAMIN WITH MINERALS) TABS tablet Take 1 tablet daily by mouth. 05/17/17  Yes Hairston, Toy Baker R, FNP  thiamine 100 MG tablet Take 1 tablet (100 mg total) daily by mouth. 05/17/17  Yes Hairston, Maylon Peppers, FNP  traMADol (ULTRAM) 50 MG tablet Take 50 mg every 6 (six) hours as needed by mouth (pain).   Yes [provider]  triamcinolone ointment (KENALOG) 0.1 % Apply 0.1 application daily after  lunch topically. For eczema on all areas except face 02/11/17  Yes [provider]  diphenhydrAMINE (BENADRYL) 25 mg capsule Take 1 capsule (25 mg total) by mouth every 6 (six) hours as needed for itching. Patient not taking: Reported on 05/21/2017 01/27/17   Raiford Noble Latif, DO  Dupilumab (DUPIXENT [place]) Inject 1 application every 14 (fourteen) days into the skin.    [provider]  folic acid (FOLVITE) 1 MG tablet Take 1 tablet (1 mg total) daily  by mouth. 05/17/17   Alfonse Spruce, FNP  lisinopril (PRINIVIL,ZESTRIL) 10 MG tablet Take 1 tablet (10 mg total) daily by mouth. Patient not taking: Reported on 05/21/2017 05/17/17   Alfonse Spruce, FNP  polyethylene glycol powder (GLYCOLAX/MIRALAX) powder Take 255 g by mouth daily as needed. Patient not taking: Reported on 05/21/2017 05/11/17   Yetta Flock, MD  senna-docusate (SENOKOT-S) 8.6-50 MG tablet Take 1 tablet by mouth at bedtime as needed for mild constipation. Patient not taking: Reported on 05/21/2017 01/27/17   Kerney Elbe, DO    Family History Family History  Problem Relation Age of Onset  . Lupus Daughter   . Stomach cancer Brother   . Colon cancer Neg Hx   . Colon polyps Neg Hx   . Esophageal cancer Neg Hx   . Rectal cancer Neg Hx     Social History Social History   Tobacco Use  . Smoking status: Never Smoker  . Smokeless tobacco: Never Used  Substance Use Topics  . Alcohol use: Yes    Alcohol/week: 1.8 - 2.4 oz    Types: 3 - 4 Cans of beer per week    Comment: beer everyday  . Drug use: No    Allergies   Lisinopril  Review of Systems Review of Systems  Constitutional: Negative for chills and fever.  HENT: Positive for facial swelling. Negative for ear pain and sore throat.   Eyes: Negative for pain and visual disturbance.  Respiratory: Negative for cough and shortness of breath.   Cardiovascular: Negative for chest pain and palpitations.  Gastrointestinal: Negative for abdominal pain and vomiting.  Genitourinary: Negative for dysuria and hematuria.  Musculoskeletal: Negative for arthralgias and back pain.  Skin: Negative for color change and rash.  Neurological: Negative for seizures and syncope.  All other systems reviewed and are negative.  Physical Exam Updated Vital Signs BP (!) 167/87   Pulse 80   Resp 15   SpO2 99%   Physical Exam  Constitutional: He is oriented to person, place, and time. He appears  well-developed and well-nourished.  HENT:  Head: Normocephalic and atraumatic.  Swelling of the lips and around the mouth  Eyes: Conjunctivae are normal.  Neck: Neck supple.  Cardiovascular: Normal rate and regular rhythm.  No murmur heard. Pulmonary/Chest: Effort normal and breath sounds normal. No respiratory distress.  Abdominal: Soft. There is no tenderness.  Musculoskeletal: He exhibits no edema.  Neurological: He is alert and oriented to person, place, and time.  Skin: Skin is warm and dry.  Psychiatric: He has a normal mood and affect.  Nursing note and vitals reviewed.  ED Treatments / Results  Labs (all labs ordered are listed, but only abnormal results are displayed) Labs Reviewed  CBC WITH DIFFERENTIAL/PLATELET - Abnormal; Notable for the following components:      Result Value   RBC 3.98 (*)    Hemoglobin 11.8 (*)    HCT 35.8 (*)    Platelets 435 (*)  All other components within normal limits  BASIC METABOLIC PANEL - Abnormal; Notable for the following components:   Potassium 3.3 (*)    CO2 21 (*)    BUN 23 (*)    Calcium 8.7 (*)    All other components within normal limits   EKG  EKG Interpretation None     Radiology No results found.  Procedures Procedures (including critical care time)  Medications Ordered in ED Medications  diphenhydrAMINE (BENADRYL) injection 25 mg (not administered)  methylPREDNISolone sodium succinate (SOLU-MEDROL) 125 mg/2 mL injection 125 mg (125 mg Intravenous Given 05/21/17 1719)  diphenhydrAMINE (BENADRYL) injection 25 mg (25 mg Intravenous Given 05/21/17 1719)  diphenhydrAMINE (BENADRYL) 50 MG/ML injection (25 mg  Given 05/21/17 1827)  famotidine (PEPCID) IVPB 20 mg premix (0 mg Intravenous Stopped 05/21/17 1926)   Initial Impression / Assessment and Plan / ED Course  I have reviewed the triage vital signs and the nursing notes.  Pertinent labs & imaging results that were available during my care of the patient were  reviewed by me and considered in my medical decision making (see chart for details).  Raymond Stone is a 66 y.o. male who presented with facial swelling after starting lisinopril recently consistent with angioedema.    On my initial assessment the patient has significant facial and perioral swelling without any tongue or soft palate involvement. No voice changes.   Patient given benadryl and steroids and observed in the ED. Throughout his ED course the patient appeared to have improving facial edema however was noted to have increasing hoarse voice. He was given Epi without any improvement.  On reassessment he appeared to have increased swelling in the soft palate and uvula.  ENT was consulted to scope the patient. The were quickly available and scope revealed significant laryngeal edema concerning for possible airway collapse. Anesthesia was consulted for assistance with intubation, and the patient was intubated in the ED by anesthesia, please refer to their note for procedural information.   Sedated with propofol. ICU called for admission.   Patient to be admitted to the ICU for further observation and medical management.   Final Clinical Impressions(s) / ED Diagnoses   Final diagnoses:  Angioedema, initial encounter   ED Discharge Orders    None       Fenton Foy, MD 05/22/17 0200    Drenda Freeze, MD 05/23/17 [phone]

## 2017-05-21 NOTE — ED Notes (Signed)
The off going nurse reported that he gave benadryl second dose but did not document that I had been given

## 2017-05-22 ENCOUNTER — Inpatient Hospital Stay (HOSPITAL_COMMUNITY): Payer: Medicare HMO | Admitting: Anesthesiology

## 2017-05-22 ENCOUNTER — Inpatient Hospital Stay (HOSPITAL_COMMUNITY): Payer: Medicare HMO

## 2017-05-22 ENCOUNTER — Other Ambulatory Visit: Payer: Self-pay

## 2017-05-22 DIAGNOSIS — I5032 Chronic diastolic (congestive) heart failure: Secondary | ICD-10-CM | POA: Diagnosis not present

## 2017-05-22 DIAGNOSIS — D649 Anemia, unspecified: Secondary | ICD-10-CM | POA: Diagnosis present

## 2017-05-22 DIAGNOSIS — R739 Hyperglycemia, unspecified: Secondary | ICD-10-CM | POA: Diagnosis present

## 2017-05-22 DIAGNOSIS — L309 Dermatitis, unspecified: Secondary | ICD-10-CM | POA: Diagnosis present

## 2017-05-22 DIAGNOSIS — Z888 Allergy status to other drugs, medicaments and biological substances status: Secondary | ICD-10-CM | POA: Diagnosis not present

## 2017-05-22 DIAGNOSIS — T380X5A Adverse effect of glucocorticoids and synthetic analogues, initial encounter: Secondary | ICD-10-CM | POA: Diagnosis not present

## 2017-05-22 DIAGNOSIS — T783XXA Angioneurotic edema, initial encounter: Principal | ICD-10-CM

## 2017-05-22 DIAGNOSIS — Z4682 Encounter for fitting and adjustment of non-vascular catheter: Secondary | ICD-10-CM | POA: Diagnosis not present

## 2017-05-22 DIAGNOSIS — J9601 Acute respiratory failure with hypoxia: Secondary | ICD-10-CM | POA: Diagnosis not present

## 2017-05-22 DIAGNOSIS — Z978 Presence of other specified devices: Secondary | ICD-10-CM | POA: Diagnosis not present

## 2017-05-22 DIAGNOSIS — E876 Hypokalemia: Secondary | ICD-10-CM | POA: Diagnosis not present

## 2017-05-22 DIAGNOSIS — J384 Edema of larynx: Secondary | ICD-10-CM | POA: Diagnosis not present

## 2017-05-22 DIAGNOSIS — I517 Cardiomegaly: Secondary | ICD-10-CM | POA: Diagnosis not present

## 2017-05-22 DIAGNOSIS — R1310 Dysphagia, unspecified: Secondary | ICD-10-CM | POA: Diagnosis not present

## 2017-05-22 DIAGNOSIS — I110 Hypertensive heart disease with heart failure: Secondary | ICD-10-CM | POA: Diagnosis not present

## 2017-05-22 DIAGNOSIS — T464X5A Adverse effect of angiotensin-converting-enzyme inhibitors, initial encounter: Secondary | ICD-10-CM | POA: Diagnosis not present

## 2017-05-22 DIAGNOSIS — J9690 Respiratory failure, unspecified, unspecified whether with hypoxia or hypercapnia: Secondary | ICD-10-CM | POA: Diagnosis not present

## 2017-05-22 LAB — BLOOD GAS, ARTERIAL
ACID-BASE DEFICIT: 5.5 mmol/L — AB (ref 0.0–2.0)
Bicarbonate: 19.2 mmol/L — ABNORMAL LOW (ref 20.0–28.0)
Drawn by: 23604
FIO2: 60
O2 SAT: 98.7 %
PEEP/CPAP: 5 cmH2O
PH ART: 7.339 — AB (ref 7.350–7.450)
Patient temperature: 98.6
RATE: 16 resp/min
VT: 580 mL
pCO2 arterial: 36.6 mmHg (ref 32.0–48.0)
pO2, Arterial: 183 mmHg — ABNORMAL HIGH (ref 83.0–108.0)

## 2017-05-22 LAB — GLUCOSE, CAPILLARY
GLUCOSE-CAPILLARY: 148 mg/dL — AB (ref 65–99)
GLUCOSE-CAPILLARY: 147 mg/dL — AB (ref 65–99)
GLUCOSE-CAPILLARY: 84 mg/dL (ref 65–99)
Glucose-Capillary: 145 mg/dL — ABNORMAL HIGH (ref 65–99)
Glucose-Capillary: 146 mg/dL — ABNORMAL HIGH (ref 65–99)
Glucose-Capillary: 105 mg/dL — ABNORMAL HIGH (ref 65–99)

## 2017-05-22 LAB — CBC
HCT: 34.2 % — ABNORMAL LOW (ref 39.0–52.0)
HEMOGLOBIN: 11.3 g/dL — AB (ref 13.0–17.0)
MCH: 29.7 pg (ref 26.0–34.0)
MCHC: 33 g/dL (ref 30.0–36.0)
MCV: 90 fL (ref 78.0–100.0)
PLATELETS: 357 10*3/uL (ref 150–400)
RBC: 3.8 MIL/uL — ABNORMAL LOW (ref 4.22–5.81)
RDW: 13 % (ref 11.5–15.5)
WBC: 10.3 10*3/uL (ref 4.0–10.5)

## 2017-05-22 LAB — BASIC METABOLIC PANEL WITH GFR
Anion gap: 8 (ref 5–15)
BUN: 21 mg/dL — ABNORMAL HIGH (ref 6–20)
CO2: 21 mmol/L — ABNORMAL LOW (ref 22–32)
Calcium: 8.4 mg/dL — ABNORMAL LOW (ref 8.9–10.3)
Chloride: 110 mmol/L (ref 101–111)
Creatinine, Ser: 1.15 mg/dL (ref 0.61–1.24)
GFR calc Af Amer: >60 mL/min
GFR calc non Af Amer: >60 mL/min
Glucose, Bld: 157 mg/dL — ABNORMAL HIGH (ref 65–99)
Potassium: 4 mmol/L (ref 3.5–5.1)
Sodium: 139 mmol/L (ref 135–145)

## 2017-05-22 LAB — HEMOGLOBIN A1C
Hgb A1c MFr Bld: 6 % — ABNORMAL HIGH (ref 4.8–5.6)
Mean Plasma Glucose: 125.5 mg/dL

## 2017-05-22 LAB — MAGNESIUM: MAGNESIUM: 1.5 mg/dL — AB (ref 1.7–2.4)

## 2017-05-22 LAB — ABO/RH: ABO/RH(D): B POS

## 2017-05-22 LAB — MRSA PCR SCREENING: MRSA by PCR: NEGATIVE

## 2017-05-22 LAB — PHOSPHORUS: Phosphorus: 3.8 mg/dL (ref 2.5–4.6)

## 2017-05-22 LAB — HIV ANTIBODY (ROUTINE TESTING W REFLEX): HIV Screen 4th Generation wRfx: NONREACTIVE

## 2017-05-22 MED ORDER — POTASSIUM CHLORIDE 10 MEQ/100ML IV SOLN
10.0000 meq | INTRAVENOUS | Status: AC
  Administered 2017-05-22 (×4): 10 meq via INTRAVENOUS
  Filled 2017-05-22 (×4): qty 100

## 2017-05-22 MED ORDER — PROPOFOL 1000 MG/100ML IV EMUL
INTRAVENOUS | Status: AC
  Filled 2017-05-22: qty 100

## 2017-05-22 MED ORDER — SODIUM CHLORIDE 0.9 % IV SOLN
Freq: Once | INTRAVENOUS | Status: AC
  Administered 2017-05-22: 1000 mL via INTRAVENOUS

## 2017-05-22 MED ORDER — GLYCOPYRROLATE 0.2 MG/ML IJ SOLN
INTRAMUSCULAR | Status: DC | PRN
  Administered 2017-05-22: .4 mg via INTRAVENOUS

## 2017-05-22 MED ORDER — PROPOFOL 10 MG/ML IV BOLUS
INTRAVENOUS | Status: DC | PRN
  Administered 2017-05-22: 150 mg via INTRAVENOUS
  Administered 2017-05-22: 50 mg via INTRAVENOUS

## 2017-05-22 MED ORDER — METHYLPREDNISOLONE SODIUM SUCC 125 MG IJ SOLR
125.0000 mg | Freq: Four times a day (QID) | INTRAMUSCULAR | Status: DC
  Administered 2017-05-22 – 2017-05-23 (×6): 125 mg via INTRAVENOUS
  Filled 2017-05-22 (×6): qty 2

## 2017-05-22 MED ORDER — INSULIN ASPART 100 UNIT/ML ~~LOC~~ SOLN
0.0000 [IU] | SUBCUTANEOUS | Status: DC
  Administered 2017-05-22 (×2): 1 [IU] via SUBCUTANEOUS
  Administered 2017-05-25: 2 [IU] via SUBCUTANEOUS
  Administered 2017-05-25: 1 [IU] via SUBCUTANEOUS

## 2017-05-22 MED ORDER — FENTANYL 2500MCG IN NS 250ML (10MCG/ML) PREMIX INFUSION
25.0000 ug/h | INTRAVENOUS | Status: DC
  Administered 2017-05-23: 50 ug/h via INTRAVENOUS
  Administered 2017-05-23 – 2017-05-24 (×2): 125 ug/h via INTRAVENOUS
  Administered 2017-05-25: 200 ug/h via INTRAVENOUS
  Filled 2017-05-22 (×4): qty 250

## 2017-05-22 MED ORDER — ORAL CARE MOUTH RINSE
15.0000 mL | Freq: Four times a day (QID) | OROMUCOSAL | Status: DC
  Administered 2017-05-22 – 2017-05-25 (×13): 15 mL via OROMUCOSAL

## 2017-05-22 MED ORDER — THIAMINE HCL 100 MG/ML IJ SOLN
100.0000 mg | Freq: Every day | INTRAMUSCULAR | Status: DC
  Administered 2017-05-22 – 2017-05-26 (×5): 100 mg via INTRAVENOUS
  Filled 2017-05-22 (×5): qty 1

## 2017-05-22 MED ORDER — PROPOFOL 1000 MG/100ML IV EMUL
5.0000 ug/kg/min | INTRAVENOUS | Status: DC
  Administered 2017-05-22 (×2): 60 ug/kg/min via INTRAVENOUS
  Administered 2017-05-22: 70 ug/kg/min via INTRAVENOUS
  Administered 2017-05-22: 80 ug/kg/min via INTRAVENOUS
  Administered 2017-05-22: 40 ug/kg/min via INTRAVENOUS
  Administered 2017-05-23: 35 ug/kg/min via INTRAVENOUS
  Administered 2017-05-23: 50 ug/kg/min via INTRAVENOUS
  Administered 2017-05-23: 40 ug/kg/min via INTRAVENOUS
  Administered 2017-05-23 (×2): 35 ug/kg/min via INTRAVENOUS
  Administered 2017-05-24: 30 ug/kg/min via INTRAVENOUS
  Administered 2017-05-24 (×2): 30.046 ug/kg/min via INTRAVENOUS
  Administered 2017-05-24: 30 ug/kg/min via INTRAVENOUS
  Administered 2017-05-24: 60 ug/kg/min via INTRAVENOUS
  Administered 2017-05-25: 40 ug/kg/min via INTRAVENOUS
  Administered 2017-05-25: 50 ug/kg/min via INTRAVENOUS
  Filled 2017-05-22 (×2): qty 100
  Filled 2017-05-22: qty 200
  Filled 2017-05-22 (×11): qty 100
  Filled 2017-05-22: qty 200

## 2017-05-22 MED ORDER — ROCURONIUM BROMIDE 100 MG/10ML IV SOLN
INTRAVENOUS | Status: DC | PRN
  Administered 2017-05-22: 30 mg via INTRAVENOUS

## 2017-05-22 MED ORDER — KETAMINE HCL 10 MG/ML IJ SOLN
INTRAMUSCULAR | Status: DC | PRN
  Administered 2017-05-22 (×3): 10 mg via INTRAVENOUS

## 2017-05-22 MED ORDER — KETAMINE HCL-SODIUM CHLORIDE 100-0.9 MG/10ML-% IV SOSY
100.0000 mg | PREFILLED_SYRINGE | Freq: Once | INTRAVENOUS | Status: AC
  Administered 2017-05-22: 30 mg via INTRAVENOUS
  Filled 2017-05-22: qty 10

## 2017-05-22 MED ORDER — DIPHENHYDRAMINE HCL 50 MG/ML IJ SOLN
25.0000 mg | Freq: Four times a day (QID) | INTRAMUSCULAR | Status: DC
  Administered 2017-05-22 – 2017-05-26 (×18): 25 mg via INTRAVENOUS
  Filled 2017-05-22 (×6): qty 0.5
  Filled 2017-05-22: qty 1
  Filled 2017-05-22 (×2): qty 0.5
  Filled 2017-05-22: qty 1
  Filled 2017-05-22 (×6): qty 0.5
  Filled 2017-05-22: qty 1
  Filled 2017-05-22 (×3): qty 0.5

## 2017-05-22 MED ORDER — FOLIC ACID 5 MG/ML IJ SOLN
1.0000 mg | Freq: Every day | INTRAMUSCULAR | Status: DC
  Administered 2017-05-22 – 2017-05-26 (×5): 1 mg via INTRAVENOUS
  Filled 2017-05-22 (×5): qty 0.2

## 2017-05-22 MED ORDER — FENTANYL CITRATE (PF) 100 MCG/2ML IJ SOLN: 50.0000 ug | INTRAMUSCULAR | Status: DC | PRN

## 2017-05-22 MED ORDER — SODIUM CHLORIDE 0.9 % IV SOLN
250.0000 mL | INTRAVENOUS | Status: DC | PRN
  Administered 2017-05-22: 250 mL via INTRAVENOUS

## 2017-05-22 MED ORDER — HEPARIN SODIUM (PORCINE) 5000 UNIT/ML IJ SOLN
5000.0000 [IU] | Freq: Three times a day (TID) | INTRAMUSCULAR | Status: DC
  Administered 2017-05-22 – 2017-05-26 (×12): 5000 [IU] via SUBCUTANEOUS
  Filled 2017-05-22 (×13): qty 1

## 2017-05-22 MED ORDER — LIDOCAINE VISCOUS 2 % MT SOLN
15.0000 mL | Freq: Once | OROMUCOSAL | Status: AC
  Administered 2017-05-22: 15 mL via OROMUCOSAL
  Filled 2017-05-22: qty 15

## 2017-05-22 MED ORDER — CHLORHEXIDINE GLUCONATE 0.12% ORAL RINSE (MEDLINE KIT)
15.0000 mL | Freq: Two times a day (BID) | OROMUCOSAL | Status: DC
  Administered 2017-05-22 – 2017-05-25 (×7): 15 mL via OROMUCOSAL

## 2017-05-22 MED ORDER — FENTANYL CITRATE (PF) 100 MCG/2ML IJ SOLN: 25.0000 ug | INTRAMUSCULAR | Status: DC | PRN

## 2017-05-22 MED ORDER — DIPHENHYDRAMINE HCL 50 MG/ML IJ SOLN
25.0000 mg | Freq: Four times a day (QID) | INTRAMUSCULAR | Status: DC | PRN
  Administered 2017-05-22 – 2017-05-23 (×2): 25 mg via INTRAVENOUS
  Filled 2017-05-22: qty 1

## 2017-05-22 MED ORDER — FENTANYL CITRATE (PF) 100 MCG/2ML IJ SOLN
50.0000 ug | INTRAMUSCULAR | Status: AC | PRN
  Administered 2017-05-22 (×3): 50 ug via INTRAVENOUS
  Filled 2017-05-22 (×2): qty 2

## 2017-05-22 MED ORDER — MIDAZOLAM HCL 5 MG/5ML IJ SOLN
INTRAMUSCULAR | Status: DC | PRN
  Administered 2017-05-22 (×4): 1 mg via INTRAVENOUS

## 2017-05-22 MED ORDER — POTASSIUM CHLORIDE 20 MEQ/15ML (10%) PO SOLN: 40.0000 meq | Freq: Once | ORAL | Status: DC

## 2017-05-22 MED ORDER — FAMOTIDINE IN NACL 20-0.9 MG/50ML-% IV SOLN
20.0000 mg | Freq: Two times a day (BID) | INTRAVENOUS | Status: DC
  Administered 2017-05-22 – 2017-05-26 (×9): 20 mg via INTRAVENOUS
  Filled 2017-05-22 (×9): qty 50

## 2017-05-22 MED ORDER — HYDRALAZINE HCL 20 MG/ML IJ SOLN
5.0000 mg | Freq: Four times a day (QID) | INTRAMUSCULAR | Status: DC | PRN
  Administered 2017-05-23: 10 mg via INTRAVENOUS
  Filled 2017-05-22: qty 1

## 2017-05-22 MED ORDER — MIDAZOLAM HCL 2 MG/2ML IJ SOLN
4.0000 mg | Freq: Once | INTRAMUSCULAR | Status: AC
  Administered 2017-05-22: 4 mg via INTRAVENOUS
  Filled 2017-05-22: qty 4

## 2017-05-22 MED ORDER — FAMOTIDINE IN NACL 20-0.9 MG/50ML-% IV SOLN: 20.0000 mg | Freq: Two times a day (BID) | INTRAVENOUS | Status: DC

## 2017-05-22 MED ORDER — FENTANYL CITRATE (PF) 100 MCG/2ML IJ SOLN
50.0000 ug | Freq: Once | INTRAMUSCULAR | Status: AC
  Administered 2017-05-23: 50 ug via INTRAVENOUS

## 2017-05-22 MED ORDER — INSULIN ASPART 100 UNIT/ML ~~LOC~~ SOLN
2.0000 [IU] | SUBCUTANEOUS | Status: DC
  Administered 2017-05-22 (×2): 2 [IU] via SUBCUTANEOUS

## 2017-05-22 MED ORDER — PROPOFOL 1000 MG/100ML IV EMUL
5.0000 ug/kg/min | Freq: Once | INTRAVENOUS | Status: AC
  Administered 2017-05-22: 10 ug/kg/min via INTRAVENOUS

## 2017-05-22 MED ORDER — FENTANYL BOLUS VIA INFUSION
25.0000 ug | INTRAVENOUS | Status: DC | PRN
  Administered 2017-05-23 (×2): 25 ug via INTRAVENOUS
  Administered 2017-05-24: 100 ug via INTRAVENOUS
  Filled 2017-05-22: qty 25

## 2017-05-22 MED ORDER — MIDAZOLAM HCL 2 MG/2ML IJ SOLN
1.0000 mg | INTRAMUSCULAR | Status: DC | PRN
  Administered 2017-05-22: 2 mg via INTRAVENOUS
  Administered 2017-05-22: 1 mg via INTRAVENOUS
  Administered 2017-05-22 – 2017-05-24 (×6): 2 mg via INTRAVENOUS
  Filled 2017-05-22 (×8): qty 2

## 2017-05-22 NOTE — ED Notes (Signed)
The pt is very agitated propofol increased to 30  The pt is motioning to his legs and he keeps moving them backward and forward he nods his head when asked if he is itching

## 2017-05-22 NOTE — ED Notes (Signed)
Pt bolused with 10 of propofol

## 2017-05-22 NOTE — ED Notes (Signed)
meds ketamine and versed given by anesthesia  See their notes for times

## 2017-05-22 NOTE — ED Notes (Signed)
Anesthesia here to intubate the pt

## 2017-05-22 NOTE — Consult Note (Signed)
Reason for Consult: Angioedema, airway edema  HPI:  Raymond Stone is an 66 y.o. male who presents to the [facility] ER complaining of facial swelling. He was started on lisinopril earlier this week. He first noted his lip and facial swelling at around 2pm yesterday. He subsequently developed difficulty swallowing and hoarseness. He was treated with benadryl in the ER. ENT consulted for airway evaluation.  Past Medical History:  Diagnosis Date  . Anemia   . Eczema   . Fluid collection (edema) in the arms, legs, hands and feet   . Hypertension     Past Surgical History:  Procedure Laterality Date  . COLONOSCOPY      Family History  Problem Relation Age of Onset  . Lupus Daughter   . Stomach cancer Brother   . Colon cancer Neg Hx   . Colon polyps Neg Hx   . Esophageal cancer Neg Hx   . Rectal cancer Neg Hx     Social History:  reports that  has never smoked. he has never used smokeless tobacco. He reports that he drinks about 1.8 - 2.4 oz of alcohol per week. He reports that he does not use drugs.  Allergies:  Allergies  Allergen Reactions  . Lisinopril Swelling    ANGIOEDEMA    Prior to Admission medications   Medication Sig Start Date End Date Taking? Authorizing Provider  bisacodyl (DULCOLAX) 5 MG EC tablet Take 1 tablet (5 mg total) by mouth daily as needed for moderate constipation. 01/27/17  Yes Sheikh, Omair Latif, DO  desonide (DESOWEN) 0.05 % ointment APPLY A SMALL AMOUNT TOPICALLY TWICE A DAY. FOR 14 DAYS. Patient taking differently: Apply 1 application daily after lunch topically. For eczema on face 05/17/17  Yes Hairston, Mandesia R, FNP  docusate sodium (COLACE) 100 MG capsule Take 100 mg 2 (two) times daily as needed by mouth for mild constipation.   Yes [provider]  ferrous sulfate 325 (65 FE) MG tablet Take 1 tablet (325 mg total) by mouth 2 (two) times daily with a meal. Patient taking differently: Take 325 mg daily with breakfast by mouth.  05/11/17  Yes  Armbruster, Carlota Raspberry, MD  fluconazole (DIFLUCAN) 200 MG tablet Take 1 tablet (200 mg total) as directed by mouth. Take 2 tablets on day one, then 1 tablet daily thereafter. Patient taking differently: Take 200-400 mg See admin instructions by mouth. Take 2 tablets on day one, then 1 tablet daily for 2 weeks (#15 filled 05/18/17) 05/18/17  Yes Armbruster, Carlota Raspberry, MD  hydrOXYzine (ATARAX/VISTARIL) 25 MG tablet Take 25 mg 3 (three) times daily as needed by mouth for itching.   Yes [provider]  metoprolol tartrate (LOPRESSOR) 25 MG tablet Take 1 tablet (25 mg total) daily by mouth. 05/17/17  Yes Hairston, Maylon Peppers, FNP  Multiple Vitamin (MULTIVITAMIN WITH MINERALS) TABS tablet Take 1 tablet daily by mouth. 05/17/17  Yes Hairston, Toy Baker R, FNP  thiamine 100 MG tablet Take 1 tablet (100 mg total) daily by mouth. 05/17/17  Yes Hairston, Maylon Peppers, FNP  traMADol (ULTRAM) 50 MG tablet Take 50 mg every 6 (six) hours as needed by mouth (pain).   Yes [provider]  triamcinolone ointment (KENALOG) 0.1 % Apply 0.1 application daily after lunch topically. For eczema on all areas except face 02/11/17  Yes [provider]  diphenhydrAMINE (BENADRYL) 25 mg capsule Take 1 capsule (25 mg total) by mouth every 6 (six) hours as needed for itching. Patient not taking: Reported on  05/21/2017 01/27/17   Raiford Noble Latif, DO  Dupilumab (DUPIXENT [place]) Inject 1 application every 14 (fourteen) days into the skin.    [provider]  folic acid (FOLVITE) 1 MG tablet Take 1 tablet (1 mg total) daily by mouth. 05/17/17   Alfonse Spruce, FNP  lisinopril (PRINIVIL,ZESTRIL) 10 MG tablet Take 1 tablet (10 mg total) daily by mouth. Patient not taking: Reported on 05/21/2017 05/17/17   Alfonse Spruce, FNP  polyethylene glycol powder (GLYCOLAX/MIRALAX) powder Take 255 g by mouth daily as needed. Patient not taking: Reported on 05/21/2017 05/11/17   Yetta Flock, MD   senna-docusate (SENOKOT-S) 8.6-50 MG tablet Take 1 tablet by mouth at bedtime as needed for mild constipation. Patient not taking: Reported on 05/21/2017 01/27/17   Kerney Elbe, DO    Medications:  I have reviewed the patient's current medications. Scheduled: . diphenhydrAMINE  25 mg Intravenous Once  . EPINEPHrine       Continuous: . sodium chloride     PRN:  Results for orders placed or performed during the hospital encounter of 05/21/17 (from the past 48 hour(s))  CBC with Differential/Platelet     Status: Abnormal   Collection Time: 05/21/17  5:54 PM  Result Value Ref Range   WBC 6.9 4.0 - 10.5 K/uL   RBC 3.98 (L) 4.22 - 5.81 MIL/uL   Hemoglobin 11.8 (L) 13.0 - 17.0 g/dL   HCT 35.8 (L) 39.0 - 52.0 %   MCV 89.9 78.0 - 100.0 fL   MCH 29.6 26.0 - 34.0 pg   MCHC 33.0 30.0 - 36.0 g/dL   RDW 13.0 11.5 - 15.5 %   Platelets 435 (H) 150 - 400 K/uL   Neutrophils Relative % 72 %   Neutro Abs 5.0 1.7 - 7.7 K/uL   Lymphocytes Relative 17 %   Lymphs Abs 1.2 0.7 - 4.0 K/uL   Monocytes Relative 10 %   Monocytes Absolute 0.7 0.1 - 1.0 K/uL   Eosinophils Relative 1 %   Eosinophils Absolute 0.1 0.0 - 0.7 K/uL   Basophils Relative 0 %   Basophils Absolute 0.0 0.0 - 0.1 K/uL  Basic metabolic panel     Status: Abnormal   Collection Time: 05/21/17  5:54 PM  Result Value Ref Range   Sodium 139 135 - 145 mmol/L   Potassium 3.3 (L) 3.5 - 5.1 mmol/L   Chloride 110 101 - 111 mmol/L   CO2 21 (L) 22 - 32 mmol/L   Glucose, Bld 79 65 - 99 mg/dL   BUN 23 (H) 6 - 20 mg/dL   Creatinine, Ser 1.11 0.61 - 1.24 mg/dL   Calcium 8.7 (L) 8.9 - 10.3 mg/dL   GFR calc non Af Amer >60 >60 mL/min   GFR calc Af Amer >60 >60 mL/min    Comment: (NOTE) The eGFR has been calculated using the CKD EPI equation. This calculation has not been validated in all clinical situations. eGFR's persistently <60 mL/min signify possible Chronic Kidney Disease.    Anion gap 8 5 - 15    No results  found.  Review of Systems  Constitutional: Negative for chills and fever.  HENT: Positive for facial swelling. Negative for ear pain and sore throat.   Eyes: Negative for pain and visual disturbance.  Respiratory: Negative for cough and shortness of breath.   Cardiovascular: Negative for chest pain and palpitations.  Gastrointestinal: Negative for abdominal pain and vomiting.  Genitourinary: Negative for dysuria and hematuria.  Musculoskeletal: Negative  for arthralgias and back pain.  Skin: Negative for color change and rash.  Neurological: Negative for seizures and syncope.  All other systems reviewed and are negative.  Blood pressure (!) 156/84, pulse (!) 109, resp. rate 15, SpO2 97 %. Physical Exam  Constitutional: He is oriented to person, place, and time. He appears well-developed and well-nourished.  Head: Normocephalic and atraumatic.  Ears: Normal auricles and EACs. Nose: Normal mucosa, septum, and turbinates. Mouth: Swelling of the lips and around the mouth  Eyes: Conjunctivae are normal. PERRL, EOMI. Neck: Neck supple. No mass. Trachea midline. Cardiovascular: Normal rate and regular rhythm.  Pulmonary/Chest: Effort normal and breath sounds normal. No respiratory distress.  Abdominal: Soft. There is no tenderness.  Musculoskeletal: He exhibits no edema.  Neurological: He is alert and oriented to person, place, and time.  Skin: Skin is warm and dry.  Psychiatric: He has a normal mood and affect.  Nursing note and vitals reviewed.  Procedure:  Flexible Fiberoptic Laryngoscopy Anesthesia: Topical oxymetazoline and lidocaine Indication: Persistent throat pain. Description: Risks, benefits, and alternatives of flexible endoscopy were explained to the patient. Specific mention was made of the risk of throat numbness with difficulty swallowing, possible bleeding from the nose and mouth, and pain from the procedure.  The patient gave oral consent to proceed.  The nasal cavities  were decongested and anesthetised with a combination of oxymetazoline and 4% lidocaine solution.  The flexible scope was inserted into the right nasal cavity and advanced towards the nasopharynx.  Visualized mucosa over the turbinates and septum were normal.  The nasopharynx was clear. The soft palate and uvula were severely edematous. The epiglottis was severely edematous. Arytenoid mucosa was also edematous.  Posterior commissure with edema and redundant mucosa.  True vocal folds were pale yellow and edematous but without mass or lesion.    Assessment/Plan: Angioedema, with severe edema of the soft palate and the larynx. His airway is significantly narrowed. Plan intubation and airway protection in the ER.  Cordelle Dahmen W Tramaine Sauls 05/22/2017, 12:53 AM

## 2017-05-22 NOTE — H&P (Signed)
PULMONARY / CRITICAL CARE MEDICINE   Name: Raymond Stone MRN: [id number] DOB: 1950-12-16    ADMISSION DATE:  05/21/2017 CONSULTATION DATE:  05/22/2017  REFERRING MD:  Dr. Levonne Lapping  CHIEF COMPLAINT:  Angioedema   HISTORY OF PRESENT ILLNESS:   66 year old male with PMH of HTN, Chronic Diastolic HF, ETOH dependence, severe eczema,   Presents to ED 11/09 with swelling of the face, airway, with difficulty swallowing after starting lisinopril 4 days ago. Given Benadryl and steroids, however patient progressively hoarse with increase in swelling. Given EPI without improvement. ENT consulted, scope revealed laryngeal edema with possible airway collapse. Intubated in ED. PCCM asked to admit.   PAST MEDICAL HISTORY :  He  has a past medical history of Anemia, Eczema, Fluid collection (edema) in the arms, legs, hands and feet, and Hypertension.  PAST SURGICAL HISTORY: He  has a past surgical history that includes Colonoscopy and IRRIGATION AND DEBRIDEMENT EXTREMITY  RIGHT MIDDLE FINGER WITH REVISION AMPUTATION AND SKIN GRAFTING. (Right, 09/13/2012).  Allergies  Allergen Reactions  . Lisinopril Swelling    ANGIOEDEMA    No current facility-administered medications on file prior to encounter.    Current Outpatient Medications on File Prior to Encounter  Medication Sig  . bisacodyl (DULCOLAX) 5 MG EC tablet Take 1 tablet (5 mg total) by mouth daily as needed for moderate constipation.  Marland Kitchen desonide (DESOWEN) 0.05 % ointment APPLY A SMALL AMOUNT TOPICALLY TWICE A DAY. FOR 14 DAYS. (Patient taking differently: Apply 1 application daily after lunch topically. For eczema on face)  . docusate sodium (COLACE) 100 MG capsule Take 100 mg 2 (two) times daily as needed by mouth for mild constipation.  . ferrous sulfate 325 (65 FE) MG tablet Take 1 tablet (325 mg total) by mouth 2 (two) times daily with a meal. (Patient taking differently: Take 325 mg daily with breakfast by mouth. )  . fluconazole (DIFLUCAN)  200 MG tablet Take 1 tablet (200 mg total) as directed by mouth. Take 2 tablets on day one, then 1 tablet daily thereafter. (Patient taking differently: Take 200-400 mg See admin instructions by mouth. Take 2 tablets on day one, then 1 tablet daily for 2 weeks (#15 filled 05/18/17))  . hydrOXYzine (ATARAX/VISTARIL) 25 MG tablet Take 25 mg 3 (three) times daily as needed by mouth for itching.  . metoprolol tartrate (LOPRESSOR) 25 MG tablet Take 1 tablet (25 mg total) daily by mouth.  . Multiple Vitamin (MULTIVITAMIN WITH MINERALS) TABS tablet Take 1 tablet daily by mouth.  . thiamine 100 MG tablet Take 1 tablet (100 mg total) daily by mouth.  . traMADol (ULTRAM) 50 MG tablet Take 50 mg every 6 (six) hours as needed by mouth (pain).  . triamcinolone ointment (KENALOG) 0.1 % Apply 0.1 application daily after lunch topically. For eczema on all areas except face  . diphenhydrAMINE (BENADRYL) 25 mg capsule Take 1 capsule (25 mg total) by mouth every 6 (six) hours as needed for itching. (Patient not taking: Reported on 05/21/2017)  . Dupilumab (DUPIXENT [place]) Inject 1 application every 14 (fourteen) days into the skin.  . folic acid (FOLVITE) 1 MG tablet Take 1 tablet (1 mg total) daily by mouth.  Marland Kitchen lisinopril (PRINIVIL,ZESTRIL) 10 MG tablet Take 1 tablet (10 mg total) daily by mouth. (Patient not taking: Reported on 05/21/2017)  . polyethylene glycol powder (GLYCOLAX/MIRALAX) powder Take 255 g by mouth daily as needed. (Patient not taking: Reported on 05/21/2017)  . senna-docusate (SENOKOT-S) 8.6-50 MG tablet Take 1  tablet by mouth at bedtime as needed for mild constipation. (Patient not taking: Reported on 05/21/2017)    FAMILY HISTORY:  His indicated that his mother is deceased. He indicated that his father is deceased. He indicated that the status of his brother is unknown. He indicated that his maternal grandmother is deceased. He indicated that his maternal grandfather is deceased. He indicated that his  paternal grandmother is deceased. He indicated that his paternal grandfather is deceased. He indicated that the status of his daughter is unknown. He indicated that the status of his neg hx is unknown.   SOCIAL HISTORY: He  reports that  has never smoked. he has never used smokeless tobacco. He reports that he drinks about 1.8 - 2.4 oz of alcohol per week. He reports that he does not use drugs.  REVIEW OF SYSTEMS:   Patient intubated and sedated   SUBJECTIVE:   VITAL SIGNS: BP (!) 216/198   Pulse (!) 135   Resp 15   Ht 5\' 10"  (1.778 m)   SpO2 100%   BMI 29.82 kg/m   HEMODYNAMICS:    VENTILATOR SETTINGS: Vent Mode: PRVC FiO2 (%):  [60 %] 60 % Set Rate:  [16 bmp] 16 bmp Vt Set:  [580 mL] 580 mL PEEP:  [5 cmH20] 5 cmH20 Plateau Pressure:  [16 cmH20] 16 cmH20  INTAKE / OUTPUT: No intake/output data recorded.  PHYSICAL EXAMINATION: General:  Adult male, on vent  Neuro:  Sedated, follows commands, pupils intact  HEENT:  ETT in place, swelling to lips/face Cardiovascular:  Tachy, no MRG  Lungs:  Clear breath sounds, no wheeze  Abdomen:  Obese, active bowel sounds  Musculoskeletal:  -edema  Skin:  Dry, intact, eczema on hands/feet/legs   LABS:  BMET Recent Labs  Lab 05/17/17 1015 05/21/17 1754  NA 143 139  K 4.6 3.3*  CL 106 110  CO2 20 21*  BUN 23 23*  CREATININE 1.08 1.11  GLUCOSE 99 79    Electrolytes Recent Labs  Lab 05/17/17 1015 05/21/17 1754  CALCIUM 9.3 8.7*    CBC Recent Labs  Lab 05/17/17 1015 05/21/17 1754  WBC 7.1 6.9  HGB 11.2* 11.8*  HCT 32.8* 35.8*  PLT 405* 435*    Coag's No results for input(s): APTT, INR in the last 168 hours.  Sepsis Markers No results for input(s): LATICACIDVEN, PROCALCITON, O2SATVEN in the last 168 hours.  ABG No results for input(s): PHART, PCO2ART, PO2ART in the last 168 hours.  Liver Enzymes Recent Labs  Lab 05/17/17 1015  AST 21  ALT 14  ALKPHOS 79  BILITOT <0.2  ALBUMIN 3.8    Cardiac  Enzymes No results for input(s): TROPONINI, PROBNP in the last 168 hours.  Glucose No results for input(s): GLUCAP in the last 168 hours.  Imaging Dg Chest Portable 1 View  Result Date: 05/22/2017 CLINICAL DATA:  Post intubation EXAM: PORTABLE CHEST 1 VIEW COMPARISON:  01/29/2014 FINDINGS: Endotracheal tube tip is about 5.2 cm superior to the carina. Mild cardiomegaly. No consolidation or effusion. No pneumothorax. IMPRESSION: 1. Endotracheal tube tip about 5.2 cm superior to carina 2. Mild cardiomegaly Electronically Signed   By: Donavan Foil M.D.   On: 05/22/2017 01:58     STUDIES:  CXR 11/10 > Endotracheal tube tip is about 5.2 cm superior to the carina. Mild cardiomegaly. No consolidation or effusion. No pneumothorax.  CULTURES: None.   ANTIBIOTICS: None.   SIGNIFICANT EVENTS: 11/09 > Presents to ED   LINES/TUBES: ETT 11/09 >>  DISCUSSION: 66 year old male presents with angioedema after lisinopril use, requiring intubated.   ASSESSMENT / PLAN:  PULMONARY A: Respiratory Insufficieny in setting of Angioedema  P:   Vent Support Wean as tolerated  Trend ABG/CXR  Solu-medrol 125 mg q6h  Benadryl scheduled q6h   CARDIOVASCULAR A:  Diastolic HF (25-85%, I7PO)  H/O HTN P:  Cardiac Monitoring  D/C Lisinopril  Hold home Lopressor   RENAL A:   Hypokalemia  P:   Trend BMP  Replace electrolytes as indicated   GASTROINTESTINAL A:   No issues  P:   NPO Pepcid BID   HEMATOLOGIC A:   Normocytic Anemia  Severe Eczema  P:  Trend CBC   INFECTIOUS A:   No issues  P:   Trend WBC and Fever Curve   ENDOCRINE A:   No issues    P:   Trend Glucose  SSI   NEUROLOGIC A:   H/O ETOH  P:   RASS goal: 0/-1 Monitor  Wean Propofol to achieve RASS PRN Fentanyl  Folic acid, thiamine    FAMILY  - Updates: no family at bedside   - Inter-disciplinary family meet or Palliative Care meeting due by: 05/29/2017    CC Time: 16 minutes  Hayden Pedro, AGACNP-BC [facility]  Pgr: [phone]  PCCM Pgr: [phone]

## 2017-05-22 NOTE — ED Notes (Signed)
Report given to paula rn

## 2017-05-22 NOTE — ED Notes (Signed)
The pt still agitated his inner legs are rfed and some hive -like appearance  He cannot lie still even though he is medicated.  Call to admitting doctor  No one has shown up yet to check this pt

## 2017-05-22 NOTE — Progress Notes (Signed)
[facility] Attending Note  ADMISSION DATE:  05/21/2017  CONSULTATION DATE:  05/22/2017  REFERRING MD:  Dr. Levonne Lapping  CHIEF COMPLAINT:  Angioedema   Presenting HPI:  66 y.o. male with known history of essential hypertension, chronic diastolic congestive heart failure, severe eczema, and alcohol dependence. Presented to the emergency department 11/9 with swelling of the face, airway, and difficulty swallowing after starting lisinopril 4 days prior. Subsequently given Benadryl and steroids with progressive worsening. Given epinephrine without improvement. ENT consulted and bedside laryngoscopy revealed laryngeal edema with possible airway collapse. Patient was subsequently intubated and admitted to the ICU.  Subjective:  No acute events since admission. Patient more agitated this morning.  Review of Systems:  Unable to obtain given intubation & sedation.   Vent Mode: CPAP;PSV FiO2 (%):  [40 %-60 %] 40 % Set Rate:  [16 bmp] 16 bmp Vt Set:  [580 mL] 580 mL PEEP:  [5 cmH20] 5 cmH20 Pressure Support:  [10 cmH20] 10 cmH20 Plateau Pressure:  [16 cmH20-17 cmH20] 17 cmH20  Temp:  [97.4 F (36.3 C)-98.8 F (37.1 C)] 97.4 F (36.3 C) (11/10 0930) Pulse Rate:  [68-138] 81 (11/10 0930) Resp:  [10-27] 27 (11/10 0930) BP: (98-216)/(53-198) 159/99 (11/10 0930) SpO2:  [88 %-100 %] 97 % (11/10 0930) FiO2 (%):  [40 %-60 %] 40 % (11/10 0757) Weight:  [207 lb 3.7 oz (94 kg)] 207 lb 3.7 oz (94 kg) (11/10 0300)  General:  No family at bedside. Intubated. No distress. Integument:  Warm & dry. No rash on exposed skin. HEENT:  Moist mucus memebranes. No scleral icterus. Angioedema noted. Endotracheal tube in place. Neurological:  Pupils symmetric. Sedated. No spontaneous movements. Musculoskeletal:  No joint effusion or erythema appreciated. Symmetric muscle bulk. Pulmonary:  Symmetric chest wall rise on ventilator. Clear breath sounds bilaterally. Cardiovascular:  Regular rate &  rhythm. No appreciable JVD. Normal S1 & S2. Telemetry:  Sinus rhythm. Abdomen:  Soft. Nondistended. Normoactive bowel sounds.  LINES/TUBES: OETT 11/10 >>> Foley 11/10 >>> PIV  CBC Latest Ref Rng & Units 05/22/2017 05/21/2017 05/17/2017  WBC 4.0 - 10.5 K/uL 10.3 6.9 7.1  Hemoglobin 13.0 - 17.0 g/dL 11.3(L) 11.8(L) 11.2(L)  Hematocrit 39.0 - 52.0 % 34.2(L) 35.8(L) 32.8(L)  Platelets 150 - 400 K/uL 357 435(H) 405(H)   BMP Latest Ref Rng & Units 05/22/2017 05/21/2017 05/17/2017  Glucose 65 - 99 mg/dL 157(H) 79 99  BUN 6 - 20 mg/dL 21(H) 23(H) 23  Creatinine 0.61 - 1.24 mg/dL 1.15 1.11 1.08  BUN/Creat Ratio 10 - 24 - - -  Sodium 135 - 145 mmol/L 139 139 143  Potassium 3.5 - 5.1 mmol/L 4.0 3.3(L) 4.6  Chloride 101 - 111 mmol/L 110 110 106  CO2 22 - 32 mmol/L 21(L) 21(L) 20  Calcium 8.9 - 10.3 mg/dL 8.4(L) 8.7(L) 9.3   Hepatic Function Latest Ref Rng & Units 05/17/2017 01/24/2017 01/31/2014  Total Protein 6.0 - 8.5 g/dL 6.9 6.2(L) 5.5(L)  Albumin 3.6 - 4.8 g/dL 3.8 2.1(L) 2.7(L)  AST 0 - 40 IU/L 21 10(L) 47(H)  ALT 0 - 44 IU/L 14 10(L) 33  Alk Phosphatase 39 - 117 IU/L 79 64 57  Total Bilirubin 0.0 - 1.2 mg/dL <0.2 0.6 0.3  Bilirubin, Direct 0.00 - 0.40 mg/dL 0.08 - -    IMAGING/STUDIES: PORT CXR 11/10:  Personally reviewed by me. Endotracheal tube in acceptable position. Lordotic views. No focal opacity or effusion appreciated.  MICROBIOLOGY: MRSA PCR 11/10:  Negative   ANTIBIOTICS: None.  SIGNIFICANT EVENTS: 11/09 - Admit w/ angioedema secondary to Lisinopril   ASSESSMENT/PLAN:  66 y.o. male with angioedema and acute respiratory failure.  1. Angioedema: Continuing to hold lisinopril. Status post FFP. Continuing Pepcid IV every 12 hours, Benadryl IV every 6 hours, and Solu-Medrol IV every 6 hours.  2. Acute respiratory failure: Unable to protect airway. Continuing endotracheal intubation. Continuous pulse oximetry monitoring. 3. Hypokalemia: Resolved. 4. Chronic diastolic  congestive heart failure: Holding diuretics and lisinopril. 5. Anemia: Mild. No signs of active bleeding. Trending cell counts daily with CBC. 6. Hypomagnesemia: Replaced. Repeat magnesium level with a.m. Labs. 7. Hyperglycemia: No history of diabetes mellitus. Likely secondary to steroids. Checking hemoglobin A1c. Accu-Cheks every 4 hours with sliding scale insulin per sensitive algorithm.  Prophylaxis:  SCDs, Pepcid IV q12hr, & Heparin [place] q8hr.  Diet:  NPO. Holding on tube feedings. Code Status:  Full Code per previous physician discussions. Disposition:  Patient to remain in ICU while intubated. Family Update: No family at bedside at the time of my rounds.   I have personally spent an additional  total of 33 minutes of critical care time today caring for the patient & reviewing the patient's electronic medical record.  Sonia Baller Ashok Cordia, M.D. [facility] Pager:  [phone] After 7pm or if no response, call [phone] 10:10 AM 05/22/17

## 2017-05-22 NOTE — ED Notes (Signed)
Intubated by anesthesia at [facility]

## 2017-05-22 NOTE — ED Notes (Signed)
meds given by anesthesiologist

## 2017-05-22 NOTE — ED Notes (Signed)
The pt talked to his sister before he was intubated to tell her he was here

## 2017-05-22 NOTE — Progress Notes (Signed)
Initial Nutrition Assessment  DOCUMENTATION CODES:   Not applicable  INTERVENTION:   -If unable to extubate within 48 hours, recommend:  Initiate TF with Vital High Protein at goal rate of 40 ml/h (960 ml per day) and Prostat 30 ml BID to provide 1160 kcals, 114 gm protein, 803 ml free water daily (will provide 2123 kcals with current rate of propofol).   NUTRITION DIAGNOSIS:   Inadequate oral intake related to inability to eat as evidenced by NPO status.  GOAL:   Patient will meet greater than or equal to 90% of their needs  MONITOR:   Vent status, Skin, Labs, Weight trends, I & O's  REASON FOR ASSESSMENT:   Ventilator    ASSESSMENT:   66 y.o. male with known history of essential hypertension, chronic diastolic congestive heart failure, severe eczema, and alcohol dependence. Presented to the emergency department 11/9 with swelling of the face, airway, and difficulty swallowing after starting lisinopril 4 days prior.   Patient is currently intubated on ventilator support.  MV: 9.0 L/min Temp (24hrs), Avg:97.9 F (36.6 C), Min:97.2 F (36.2 C), Max:98.8 F (37.1 C)  Propofol: 35 ml/hr (963 kcals)  Labs reviewed: CBGS: 145-148 (inpatient orders for glycemic control are 0-9 units insulin aspart every 4 hours).  NUTRITION - FOCUSED PHYSICAL EXAM:    Most Recent Value  Orbital Region  No depletion  Upper Arm Region  No depletion  Thoracic and Lumbar Region  No depletion  Buccal Region  No depletion  Temple Region  Mild depletion  Clavicle Bone Region  No depletion  Clavicle and Acromion Bone Region  No depletion  Scapular Bone Region  No depletion  Dorsal Hand  No depletion  Patellar Region  No depletion  Anterior Thigh Region  No depletion  Posterior Calf Region  No depletion  Edema (RD Assessment)  Mild  Hair  Reviewed  Eyes  Reviewed  Mouth  Unable to assess  Skin  Reviewed  Nails  Reviewed       Diet Order:  Diet NPO time specified  EDUCATION  NEEDS:   No education needs have been identified at this time  Skin:  Skin Assessment: Reviewed RN Assessment  Last BM:  PTA  Height:   Ht Readings from Last 1 Encounters:  05/22/17 5\' 10"  (1.778 m)    Weight:   Wt Readings from Last 1 Encounters:  05/22/17 207 lb 3.7 oz (94 kg)    Ideal Body Weight:  75.5 kg  BMI:  Body mass index is 29.73 kg/m.  Estimated Nutritional Needs:   Kcal:  1928.8  Protein:  110-125 grams  Fluid:  1.9-2.1 L    Aika Brzoska A. Jimmye Norman, RD, LDN, CDE Pager: [phone] After hours Pager: [phone]

## 2017-05-22 NOTE — Anesthesia Procedure Notes (Signed)
Procedure Name: Awake intubation Date/Time: 05/22/2017 1:41 AM Performed by: Audry Pili, MD Pre-anesthesia Checklist: Patient identified, Emergency Drugs available, Suction available, Patient being monitored and Timeout performed Patient Re-evaluated:Patient Re-evaluated prior to induction Oxygen Delivery Method: Nasal cannula and Ambu bag Preoxygenation: Pre-oxygenation with 100% oxygen Laryngoscope Size: Glidescope and 4 Grade View: Grade I Tube type: Subglottic suction tube Tube size: 7.5 mm Number of attempts: 3 Airway Equipment and Method: Video-laryngoscopy and Fiberoptic brochoscope Placement Confirmation: ETT inserted through vocal cords under direct vision,  breath sounds checked- equal and bilateral and CO2 detector Secured at: 23 cm Tube secured with: [place]. Dental Injury: Teeth and Oropharynx as per pre-operative assessment  Difficulty Due To: Difficulty was anticipated and Difficult Airway-  due to edematous airway Comments: Called to ED for angioedema. Patient with swollen lips, mallampati 3 on exam, good mouth opening. Per ENT at bedside, epiglottis significantly swollen on his exam, tongue minimally swollen.  Nasal cannula placed for preoxygenation. Viscous lidocaine lollipop used to anesthetize airway. Versed and ketamine titrated in slowly, as documented. Initial attempt with fiberscope unsuccessful due to coughing, poor visualization. Second attempt with glidescope, but again, poor views due to coughing. ENT performed transtracheal lidocaine injection to assist in anesthetizing airway. Third attempt with combination of glidescope (to help lift edematous tissue) and fiberscope successful. Patient sedated and paralyzed upon ETT placement.

## 2017-05-22 NOTE — Progress Notes (Signed)
  Date of Service  05/22/2017   HPI/Events  Increase needs for sedation   Interventions  Continue Propofol gtt  Start fentanyl gtt    Hayden Pedro, AGACNP-BC [place]  Pgr: [phone]  PCCM Pgr: [phone]

## 2017-05-22 NOTE — ED Notes (Signed)
Called to give report unsuccessful.

## 2017-05-22 NOTE — Transfer of Care (Signed)
Immediate Anesthesia Transfer of Care Note  Patient: Molly Maselli  Procedure(s) Performed: AN AD [place]  Patient Location: ED  Anesthesia Type:General  Level of Consciousness: sedated  Airway & Oxygen Therapy: Patient remains intubated per anesthesia plan and Patient placed on Ventilator (see vital sign flow sheet for setting)  Post-op Assessment: Report given to RN and Post -op Vital signs reviewed and stable  Post vital signs: Reviewed and stable  Last Vitals:  Vitals:   05/22/17 0130 05/22/17 0140  BP: (!) 216/198   Pulse: (!) 135   Resp:    SpO2: 99% 100%    Last Pain: There were no vitals filed for this visit.       Complications: No apparent anesthesia complications

## 2017-05-23 DIAGNOSIS — T783XXA Angioneurotic edema, initial encounter: Secondary | ICD-10-CM

## 2017-05-23 LAB — BPAM FFP
BLOOD PRODUCT EXPIRATION DATE: 201811152359
Blood Product Expiration Date: 201811152359
ISSUE DATE / TIME: 201811100550
ISSUE DATE / TIME: 201811100550
UNIT TYPE AND RH: 7300
UNIT TYPE AND RH: 7300

## 2017-05-23 LAB — PREPARE FRESH FROZEN PLASMA
UNIT DIVISION: 0
Unit division: 0

## 2017-05-23 LAB — GLUCOSE, CAPILLARY
GLUCOSE-CAPILLARY: 96 mg/dL (ref 65–99)
GLUCOSE-CAPILLARY: 118 mg/dL — AB (ref 65–99)
GLUCOSE-CAPILLARY: 94 mg/dL (ref 65–99)
GLUCOSE-CAPILLARY: 116 mg/dL — AB (ref 65–99)
Glucose-Capillary: 108 mg/dL — ABNORMAL HIGH (ref 65–99)

## 2017-05-23 LAB — RENAL FUNCTION PANEL
Albumin: 3 g/dL — ABNORMAL LOW (ref 3.5–5.0)
Anion gap: 9 (ref 5–15)
BUN: 21 mg/dL — AB (ref 6–20)
CHLORIDE: 110 mmol/L (ref 101–111)
CO2: 20 mmol/L — AB (ref 22–32)
CREATININE: 1.03 mg/dL (ref 0.61–1.24)
Calcium: 8.8 mg/dL — ABNORMAL LOW (ref 8.9–10.3)
GFR calc Af Amer: >60 mL/min (ref >60)
GFR calc non Af Amer: >60 mL/min (ref >60)
GLUCOSE: 118 mg/dL — AB (ref 65–99)
Phosphorus: 3.3 mg/dL (ref 2.5–4.6)
Potassium: 3.8 mmol/L (ref 3.5–5.1)
Sodium: 139 mmol/L (ref 135–145)

## 2017-05-23 LAB — CBC WITH DIFFERENTIAL/PLATELET
Basophils Absolute: 0 10*3/uL (ref 0.0–0.1)
Basophils Relative: 0 %
EOS ABS: 0 10*3/uL (ref 0.0–0.7)
EOS PCT: 0 %
HCT: 36.2 % — ABNORMAL LOW (ref 39.0–52.0)
HEMOGLOBIN: 11.9 g/dL — AB (ref 13.0–17.0)
LYMPHS ABS: 1.2 10*3/uL (ref 0.7–4.0)
Lymphocytes Relative: 10 %
MCH: 29.5 pg (ref 26.0–34.0)
MCHC: 32.9 g/dL (ref 30.0–36.0)
MCV: 89.8 fL (ref 78.0–100.0)
MONO ABS: 0.5 10*3/uL (ref 0.1–1.0)
MONOS PCT: 4 %
Neutro Abs: 9.9 10*3/uL — ABNORMAL HIGH (ref 1.7–7.7)
Neutrophils Relative %: 86 %
PLATELETS: 367 10*3/uL (ref 150–400)
RBC: 4.03 MIL/uL — ABNORMAL LOW (ref 4.22–5.81)
RDW: 13 % (ref 11.5–15.5)
WBC: 11.6 10*3/uL — ABNORMAL HIGH (ref 4.0–10.5)

## 2017-05-23 LAB — MAGNESIUM: Magnesium: 2.1 mg/dL (ref 1.7–2.4)

## 2017-05-23 MED ORDER — FUROSEMIDE 10 MG/ML IJ SOLN
20.0000 mg | Freq: Once | INTRAMUSCULAR | Status: AC
  Administered 2017-05-23: 20 mg via INTRAVENOUS
  Filled 2017-05-23: qty 2

## 2017-05-23 MED ORDER — METHYLPREDNISOLONE SODIUM SUCC 40 MG IJ SOLR
40.0000 mg | Freq: Four times a day (QID) | INTRAMUSCULAR | Status: DC
  Administered 2017-05-23 – 2017-05-24 (×4): 40 mg via INTRAVENOUS
  Filled 2017-05-23 (×5): qty 1

## 2017-05-23 NOTE — Progress Notes (Signed)
[place] Attending Note  ADMISSION DATE:  05/21/2017  CONSULTATION DATE:  05/22/2017  REFERRING MD:  Dr. Levonne Lapping  CHIEF COMPLAINT:  Angioedema   Presenting HPI:  66 y.o. male with known history of essential hypertension, chronic diastolic congestive heart failure, severe eczema, and alcohol dependence. Presented to the emergency department 11/9 with swelling of the face, airway, and difficulty swallowing after starting lisinopril 4 days prior. Subsequently given Benadryl and steroids with progressive worsening. Given epinephrine without improvement. ENT consulted and bedside laryngoscopy revealed laryngeal edema with possible airway collapse. Patient was subsequently intubated and admitted to the ICU.  Subjective:  Patient more agitated overnight. Started on fentanyl drip.  Review of Systems:  Unable to obtain given intubation & sedation.   Vent Mode: PRVC FiO2 (%):  [30 %-40 %] 30 % Set Rate:  [16 bmp] 16 bmp Vt Set:  [580 mL] 580 mL PEEP:  [5 cmH20] 5 cmH20 Plateau Pressure:  [15 cmH20-18 cmH20] 16 cmH20  Temp:  [97.1 F (36.2 C)-97.5 F (36.4 C)] 97.4 F (36.3 C) (11/11 0728) Pulse Rate:  [52-81] 59 (11/11 0740) Resp:  [10-27] 16 (11/11 0740) BP: (144-181)/(78-111) 167/85 (11/11 0740) SpO2:  [97 %-100 %] 100 % (11/11 0740) FiO2 (%):  [30 %-40 %] 30 % (11/11 0740) Weight:  [211 lb 13.8 oz (96.1 kg)] 211 lb 13.8 oz (96.1 kg) (11/11 0500)  Gen.: No distress. No family present. Sedated. HEENT: Moist mucous membranes. Minimal angioedema. No scleral icterus. Neurological: Sedated. Not moving extremities. Pupils symmetric and reactive. Pulmonary: Clear breath sounds bilaterally. Symmetric chest rise. Cardiovascular: Regular rhythm. Borderline bradycardia. No edema appreciated. Abdomen: Soft. Minimally protuberant. Normal bowel sounds.  LINES/TUBES: OETT 11/10 >>> PIV  CBC Latest Ref Rng & Units 05/22/2017 05/21/2017 05/17/2017  WBC 4.0 - 10.5 K/uL 10.3  6.9 7.1  Hemoglobin 13.0 - 17.0 g/dL 11.3(L) 11.8(L) 11.2(L)  Hematocrit 39.0 - 52.0 % 34.2(L) 35.8(L) 32.8(L)  Platelets 150 - 400 K/uL 357 435(H) 405(H)   BMP Latest Ref Rng & Units 05/22/2017 05/21/2017 05/17/2017  Glucose 65 - 99 mg/dL 157(H) 79 99  BUN 6 - 20 mg/dL 21(H) 23(H) 23  Creatinine 0.61 - 1.24 mg/dL 1.15 1.11 1.08  BUN/Creat Ratio 10 - 24 - - -  Sodium 135 - 145 mmol/L 139 139 143  Potassium 3.5 - 5.1 mmol/L 4.0 3.3(L) 4.6  Chloride 101 - 111 mmol/L 110 110 106  CO2 22 - 32 mmol/L 21(L) 21(L) 20  Calcium 8.9 - 10.3 mg/dL 8.4(L) 8.7(L) 9.3   Hepatic Function Latest Ref Rng & Units 05/17/2017 01/24/2017 01/31/2014  Total Protein 6.0 - 8.5 g/dL 6.9 6.2(L) 5.5(L)  Albumin 3.6 - 4.8 g/dL 3.8 2.1(L) 2.7(L)  AST 0 - 40 IU/L 21 10(L) 47(H)  ALT 0 - 44 IU/L 14 10(L) 33  Alk Phosphatase 39 - 117 IU/L 79 64 57  Total Bilirubin 0.0 - 1.2 mg/dL <0.2 0.6 0.3  Bilirubin, Direct 0.00 - 0.40 mg/dL 0.08 - -    IMAGING/STUDIES: PORT CXR 11/10:  Previously reviewed by me. Endotracheal tube in acceptable position. Lordotic views. No focal opacity or effusion appreciated.  MICROBIOLOGY: MRSA PCR 11/10:  Negative   ANTIBIOTICS: None.  SIGNIFICANT EVENTS: 11/09 - Admit w/ angioedema secondary to Lisinopril   ASSESSMENT/PLAN:  66 y.o. male with angioedema likely secondary to lisinopril. Acute respiratory failure with difficult airway. Patient more hypertensive at this time.   1. Angioedema: Holding lisinopril. Continue Pepcid IV every 12 hours & Benadryl IV every  6 hours. Decreasing dose of Solu-Medrol to 40 mg IV every 6 hours.  2. Acute respiratory failure: Unable to protect airway and difficult airway. Continuing endotracheal intubation. Cultures extubation. Continuous pulse oximetry monitoring. May require extubation in the OR. Giving Lasix 20 mg IV 1. 3. Chronic diastolic congestive heart failure: Holding lisinopril. Holding Lopressor given borderline bradycardia. 4. Anemia: No  signs of active bleeding. Repeating CBC today. 5. Hypokalemia: Resolved. Repeating electrolytes this morning.  6. Hypomagnesemia: Repeating serum magnesium today. 7. Hyperglycemia: Likely secondary to steroids. No history of diabetes mellitus. Hemoglobin A1c 6.0. Continuing Accu-Cheks every 4 hours with sensitive sliding scale insulin algorithm.   Prophylaxis:  SCDs, Pepcid IV q12hr, & Heparin [place] q8hr.  Diet:  NPO. Holding on tube feedings. Code Status:  Full Code per previous physician discussions. Disposition:  Patient to remain in ICU while intubated. Family Update: No family at bedside this morning.   I have personally spent a total of 31 minutes of critical care time today caring for the patient & reviewing the patient's electronic medical record.  Sonia Baller Ashok Cordia, M.D. [facility] Pulmonary & Critical Care Pager:  [phone] After 7pm or if no response, call [phone] 7:59 AM 05/23/17

## 2017-05-23 NOTE — Progress Notes (Signed)
Subjective: Pt sedated, intubated, on vemt.  Objective: Vital signs in last 24 hours: Temp:  [97.1 F (36.2 C)-97.5 F (36.4 C)] 97.4 F (36.3 C) (11/11 0728) Pulse Rate:  [52-72] 59 (11/11 0740) Resp:  [14-21] 16 (11/11 0740) BP: (144-181)/(78-111) 167/85 (11/11 0740) SpO2:  [98 %-100 %] 100 % (11/11 0740) FiO2 (%):  [30 %-40 %] 30 % (11/11 0740) Weight:  [96.1 kg (211 lb 13.8 oz)] 96.1 kg (211 lb 13.8 oz) (11/11 0500)  Physical Exam Constitutional: He appearswell-developedand well-nourished. Sedated. Head:Normocephalicand atraumatic. Ears: Normal auricles and EACs. Nose: Normal mucosa, septum, and turbinates. Mouth: The angioedema has mostly resolved. Eyes:Conjunctivaeare normal. PERRL, EOMI. Neck:Neck supple. No mass. Trachea midline. Cardiovascular:Normal rateand regular rhythm. Musculoskeletal: He exhibits noedema.  Neurological: Sedated, on vent. Skin: Skin iswarmand dry.  Nursing noteand vitalsreviewed.    Recent Labs    05/22/17 0334 05/23/17 0802  WBC 10.3 11.6*  HGB 11.3* 11.9*  HCT 34.2* 36.2*  PLT 357 367   Recent Labs    05/22/17 0334 05/23/17 0802  NA 139 139  K 4.0 3.8  CL 110 110  CO2 21* 20*  GLUCOSE 157* 118*  BUN 21* 21*  CREATININE 1.15 1.03  CALCIUM 8.4* 8.8*    Medications:  I have reviewed the patient's current medications. Scheduled: . chlorhexidine gluconate (MEDLINE KIT)  15 mL Mouth Rinse BID  . diphenhydrAMINE  25 mg Intravenous T9Q  . folic acid  1 mg Intravenous Daily  . furosemide  20 mg Intravenous Once  . heparin  5,000 Units Subcutaneous Q8H  . insulin aspart  0-9 Units Subcutaneous Q4H  . mouth rinse  15 mL Mouth Rinse QID  . methylPREDNISolone (SOLU-MEDROL) injection  40 mg Intravenous Q6H  . thiamine injection  100 mg Intravenous Daily   Continuous: . sodium chloride 250 mL (05/22/17 1353)  . famotidine (PEPCID) IV Stopped (05/22/17 2323)  . fentaNYL infusion INTRAVENOUS 125 mcg/hr (05/23/17  0533)  . propofol (DIPRIVAN) infusion 35 mcg/kg/min (05/23/17 3009)   QZR:AQTMAU chloride, diphenhydrAMINE, fentaNYL, hydrALAZINE, midazolam  Assessment/Plan: Angioedema secondary to the use of lisinopril. Most swelling has resolved. May extubate when airleak is detected.   LOS: 1 day   Arslan Kier W Brystol Wasilewski 05/23/2017, 10:39 AM

## 2017-05-24 ENCOUNTER — Inpatient Hospital Stay (HOSPITAL_COMMUNITY): Payer: Medicare HMO

## 2017-05-24 DIAGNOSIS — J9601 Acute respiratory failure with hypoxia: Secondary | ICD-10-CM

## 2017-05-24 DIAGNOSIS — T783XXA Angioneurotic edema, initial encounter: Secondary | ICD-10-CM

## 2017-05-24 LAB — GLUCOSE, CAPILLARY
GLUCOSE-CAPILLARY: 117 mg/dL — AB (ref 65–99)
GLUCOSE-CAPILLARY: 72 mg/dL (ref 65–99)
GLUCOSE-CAPILLARY: 81 mg/dL (ref 65–99)
Glucose-Capillary: 111 mg/dL — ABNORMAL HIGH (ref 65–99)
Glucose-Capillary: 90 mg/dL (ref 65–99)

## 2017-05-24 LAB — PHOSPHORUS
Phosphorus: 3.7 mg/dL (ref 2.5–4.6)
Phosphorus: 3.7 mg/dL (ref 2.5–4.6)

## 2017-05-24 LAB — CBC WITH DIFFERENTIAL/PLATELET
BASOS PCT: 0 %
Basophils Absolute: 0 10*3/uL (ref 0.0–0.1)
EOS ABS: 0 10*3/uL (ref 0.0–0.7)
EOS PCT: 0 %
HCT: 38.5 % — ABNORMAL LOW (ref 39.0–52.0)
Hemoglobin: 12.8 g/dL — ABNORMAL LOW (ref 13.0–17.0)
LYMPHS ABS: 0.6 10*3/uL — AB (ref 0.7–4.0)
Lymphocytes Relative: 5 %
MCH: 29.7 pg (ref 26.0–34.0)
MCHC: 33.2 g/dL (ref 30.0–36.0)
MCV: 89.3 fL (ref 78.0–100.0)
MONOS PCT: 6 %
Monocytes Absolute: 0.6 10*3/uL (ref 0.1–1.0)
Neutro Abs: 9.8 10*3/uL — ABNORMAL HIGH (ref 1.7–7.7)
Neutrophils Relative %: 89 %
PLATELETS: 292 10*3/uL (ref 150–400)
RBC: 4.31 MIL/uL (ref 4.22–5.81)
RDW: 13.2 % (ref 11.5–15.5)
WBC: 11 10*3/uL — AB (ref 4.0–10.5)

## 2017-05-24 LAB — RENAL FUNCTION PANEL
ALBUMIN: 3 g/dL — AB (ref 3.5–5.0)
ANION GAP: 9 (ref 5–15)
BUN: 25 mg/dL — ABNORMAL HIGH (ref 6–20)
CALCIUM: 8.8 mg/dL — AB (ref 8.9–10.3)
CO2: 21 mmol/L — ABNORMAL LOW (ref 22–32)
CREATININE: 1.32 mg/dL — AB (ref 0.61–1.24)
Chloride: 111 mmol/L (ref 101–111)
GFR calc non Af Amer: 55 mL/min — ABNORMAL LOW (ref >60)
Glucose, Bld: 125 mg/dL — ABNORMAL HIGH (ref 65–99)
PHOSPHORUS: 3.2 mg/dL (ref 2.5–4.6)
Potassium: 3.5 mmol/L (ref 3.5–5.1)
SODIUM: 141 mmol/L (ref 135–145)

## 2017-05-24 LAB — MAGNESIUM
MAGNESIUM: 2.3 mg/dL (ref 1.7–2.4)
Magnesium: 2.2 mg/dL (ref 1.7–2.4)
Magnesium: 2.1 mg/dL (ref 1.7–2.4)

## 2017-05-24 MED ORDER — POTASSIUM CHLORIDE 10 MEQ/100ML IV SOLN
10.0000 meq | INTRAVENOUS | Status: AC
  Administered 2017-05-24 (×4): 10 meq via INTRAVENOUS
  Filled 2017-05-24 (×4): qty 100

## 2017-05-24 MED ORDER — VITAL HIGH PROTEIN PO LIQD: 1000.0000 mL | ORAL | Status: DC

## 2017-05-24 MED ORDER — METHYLPREDNISOLONE SODIUM SUCC 40 MG IJ SOLR
40.0000 mg | Freq: Two times a day (BID) | INTRAMUSCULAR | Status: DC
  Administered 2017-05-24 – 2017-05-26 (×4): 40 mg via INTRAVENOUS
  Filled 2017-05-24 (×5): qty 1

## 2017-05-24 MED ORDER — VITAL HIGH PROTEIN PO LIQD
1000.0000 mL | ORAL | Status: DC
  Administered 2017-05-24: 1000 mL

## 2017-05-24 MED ORDER — PRO-STAT SUGAR FREE PO LIQD
30.0000 mL | Freq: Two times a day (BID) | ORAL | Status: DC
  Administered 2017-05-24: 30 mL
  Filled 2017-05-24: qty 30

## 2017-05-24 MED ORDER — POTASSIUM CHLORIDE 20 MEQ/15ML (10%) PO SOLN: 40.0000 meq | Freq: Once | ORAL | Status: DC

## 2017-05-24 NOTE — Progress Notes (Signed)
Subjective: No new issues. Pt is still on vent.  Objective: Vital signs in last 24 hours: Temp:  [97.4 F (36.3 C)-97.6 F (36.4 C)] 97.4 F (36.3 C) (11/12 0336) Pulse Rate:  [55-68] 63 (11/12 0400) Resp:  [10-26] 16 (11/12 0400) BP: (114-167)/(73-93) 146/90 (11/12 0400) SpO2:  [98 %-100 %] 100 % (11/12 0400) FiO2 (%):  [30 %-40 %] 30 % (11/12 0400) Weight:  [95.8 kg (211 lb 3.2 oz)] 95.8 kg (211 lb 3.2 oz) (11/12 0340)  Physical Exam Constitutional: He appearswell-developedand well-nourished. Sedated. Head:Normocephalicand atraumatic. Ears: Normal auricles and EACs. Nose: Normal mucosa, septum, and turbinates. Mouth:The angioedema has mostly resolved. Eyes:Conjunctivaeare normal.PERRL, EOMI. Neck:Neck supple.No mass. Trachea midline. Cardiovascular:Normal rateand regular rhythm. Musculoskeletal: He exhibits noedema.  Neurological: Sedated, on vent. Skin: Skin iswarmand dry.  Vitalsreviewed.  Recent Labs    05/23/17 0802 05/24/17 0306  WBC 11.6* 11.0*  HGB 11.9* 12.8*  HCT 36.2* 38.5*  PLT 367 292   Recent Labs    05/23/17 0802 05/24/17 0306  NA 139 141  K 3.8 3.5  CL 110 111  CO2 20* 21*  GLUCOSE 118* 125*  BUN 21* 25*  CREATININE 1.03 1.32*  CALCIUM 8.8* 8.8*    Medications:  I have reviewed the patient's current medications. Scheduled: . chlorhexidine gluconate (MEDLINE KIT)  15 mL Mouth Rinse BID  . diphenhydrAMINE  25 mg Intravenous M0N  . folic acid  1 mg Intravenous Daily  . heparin  5,000 Units Subcutaneous Q8H  . insulin aspart  0-9 Units Subcutaneous Q4H  . mouth rinse  15 mL Mouth Rinse QID  . methylPREDNISolone (SOLU-MEDROL) injection  40 mg Intravenous Q6H  . thiamine injection  100 mg Intravenous Daily   Continuous: . sodium chloride 250 mL (05/24/17 0400)  . famotidine (PEPCID) IV Stopped (05/23/17 2305)  . fentaNYL infusion INTRAVENOUS 125 mcg/hr (05/24/17 0000)  . potassium chloride 10 mEq (05/24/17 0455)  .  propofol (DIPRIVAN) infusion 30 mcg/kg/min (05/24/17 0057)   OBS:JGGEZM chloride, diphenhydrAMINE, fentaNYL, hydrALAZINE, midazolam  Assessment/Plan: Angioedema secondary to the use of lisinopril. His swelling has resolved. May extubate when airleak is detected. The patient has normal pharyngeal and laryngeal anatomy. His initial difficult airway was entirely due to his angioedema.    LOS: 2 days   Kelven Flater W Myesha Stillion 05/24/2017, 5:49 AM

## 2017-05-24 NOTE — Progress Notes (Signed)
PULMONARY / CRITICAL CARE MEDICINE   Name: Raymond Stone MRN: [id number] DOB: 1950-12-18    ADMISSION DATE:  05/21/2017 CONSULTATION DATE:  05/21/2017  REFERRING MD:  Dr. Levonne Lapping  CHIEF COMPLAINT:  Angioedema  HISTORY OF PRESENT ILLNESS: 66 y.o. male with known history of essential hypertension, chronic diastolic congestive heart failure, severe eczema, and alcohol dependence. Presented to the emergency department 11/9 with swelling of the face, airway, and difficulty swallowing after starting lisinopril 4 days prior. Subsequently given Benadryl and steroids with progressive worsening. Given epinephrine without improvement. ENT consulted and bedside laryngoscopy revealed laryngeal edema with possible airway collapse. Patient was subsequently intubated and admitted to the ICU.  PAST MEDICAL HISTORY :  He  has a past medical history of Anemia, Eczema, Fluid collection (edema) in the arms, legs, hands and feet, and Hypertension.  PAST SURGICAL HISTORY: He  has a past surgical history that includes Colonoscopy and IRRIGATION AND DEBRIDEMENT EXTREMITY  RIGHT MIDDLE FINGER WITH REVISION AMPUTATION AND SKIN GRAFTING. (Right, 09/13/2012).  Allergies  Allergen Reactions  . Lisinopril Swelling    ANGIOEDEMA    No current facility-administered medications on file prior to encounter.    Current Outpatient Medications on File Prior to Encounter  Medication Sig  . bisacodyl (DULCOLAX) 5 MG EC tablet Take 1 tablet (5 mg total) by mouth daily as needed for moderate constipation.  Marland Kitchen desonide (DESOWEN) 0.05 % ointment APPLY A SMALL AMOUNT TOPICALLY TWICE A DAY. FOR 14 DAYS. (Patient taking differently: Apply 1 application daily after lunch topically. For eczema on face)  . docusate sodium (COLACE) 100 MG capsule Take 100 mg 2 (two) times daily as needed by mouth for mild constipation.  . ferrous sulfate 325 (65 FE) MG tablet Take 1 tablet (325 mg total) by mouth 2 (two) times daily with a meal. (Patient  taking differently: Take 325 mg daily with breakfast by mouth. )  . fluconazole (DIFLUCAN) 200 MG tablet Take 1 tablet (200 mg total) as directed by mouth. Take 2 tablets on day one, then 1 tablet daily thereafter. (Patient taking differently: Take 200-400 mg See admin instructions by mouth. Take 2 tablets on day one, then 1 tablet daily for 2 weeks (#15 filled 05/18/17))  . hydrOXYzine (ATARAX/VISTARIL) 25 MG tablet Take 25 mg 3 (three) times daily as needed by mouth for itching.  . metoprolol tartrate (LOPRESSOR) 25 MG tablet Take 1 tablet (25 mg total) daily by mouth.  . Multiple Vitamin (MULTIVITAMIN WITH MINERALS) TABS tablet Take 1 tablet daily by mouth.  . thiamine 100 MG tablet Take 1 tablet (100 mg total) daily by mouth.  . traMADol (ULTRAM) 50 MG tablet Take 50 mg every 6 (six) hours as needed by mouth (pain).  . triamcinolone ointment (KENALOG) 0.1 % Apply 0.1 application daily after lunch topically. For eczema on all areas except face  . diphenhydrAMINE (BENADRYL) 25 mg capsule Take 1 capsule (25 mg total) by mouth every 6 (six) hours as needed for itching. (Patient not taking: Reported on 05/21/2017)  . Dupilumab (DUPIXENT [place]) Inject 1 application every 14 (fourteen) days into the skin.  . folic acid (FOLVITE) 1 MG tablet Take 1 tablet (1 mg total) daily by mouth.  Marland Kitchen lisinopril (PRINIVIL,ZESTRIL) 10 MG tablet Take 1 tablet (10 mg total) daily by mouth. (Patient not taking: Reported on 05/21/2017)  . polyethylene glycol powder (GLYCOLAX/MIRALAX) powder Take 255 g by mouth daily as needed. (Patient not taking: Reported on 05/21/2017)  . senna-docusate (SENOKOT-S) 8.6-50 MG tablet Take 1  tablet by mouth at bedtime as needed for mild constipation. (Patient not taking: Reported on 05/21/2017)    FAMILY HISTORY:  His indicated that his mother is deceased. He indicated that his father is deceased. He indicated that the status of his brother is unknown. He indicated that his maternal grandmother is  deceased. He indicated that his maternal grandfather is deceased. He indicated that his paternal grandmother is deceased. He indicated that his paternal grandfather is deceased. He indicated that the status of his daughter is unknown. He indicated that the status of his neg hx is unknown.   SOCIAL HISTORY: He  reports that  has never smoked. he has never used smokeless tobacco. He reports that he drinks about 1.8 - 2.4 oz of alcohol per week. He reports that he does not use drugs.  REVIEW OF SYSTEMS:   Unable to obtain given intubation with sedation.  SUBJECTIVE:  No acute events. Afebrile with stable vitals. Tolerating vent. Not requiring pressors.  VITAL SIGNS: BP (!) 175/93   Pulse 63   Temp (!) 97.5 F (36.4 C) (Oral) Comment: Raymond Kelp, RN notified  Resp 17   Ht 5\' 10"  (1.778 m)   Wt 211 lb 3.2 oz (95.8 kg)   SpO2 100%   BMI 30.30 kg/m   HEMODYNAMICS:    VENTILATOR SETTINGS: Vent Mode: CPAP;PSV FiO2 (%):  [30 %] 30 % Set Rate:  [16 bmp] 16 bmp Vt Set:  [580 mL] 580 mL PEEP:  [5 cmH20] 5 cmH20 Pressure Support:  [10 cmH20] 10 cmH20 Plateau Pressure:  [16 cmH20-24 cmH20] 16 cmH20  INTAKE / OUTPUT: I/O last 3 completed shifts: In: 2029.8 [I.V.:1779.8; IV Piggyback:250] Out: 1610 [RUEAV:4098]  PHYSICAL EXAMINATION: General: intubated and sedated, NAD HEENT: normocephalic, atraumatic, moist mucous membranes, mild angioedema present on lower lip Neck: supple, non-tender without lymphadenopathy Cardiovascular: regular rate and rhythm without murmurs, rubs, or gallops Lungs: clear to auscultation bilaterally with normal work of breathing Abdomen: soft, non-tender, non-distended, normoactive bowel sounds Skin: warm, dry, no rashes or lesions, cap refill < 2 seconds Extremities: warm and well perfused, normal tone, no edema Neuro: sedated, not moving extremities, PERRLA  LABS:  BMET Recent Labs  Lab 05/22/17 0334 05/23/17 0802 05/24/17 0306  NA 139 139 141  K 4.0  3.8 3.5  CL 110 110 111  CO2 21* 20* 21*  BUN 21* 21* 25*  CREATININE 1.15 1.03 1.32*  GLUCOSE 157* 118* 125*    Electrolytes Recent Labs  Lab 05/22/17 0334 05/23/17 0802 05/24/17 0306  CALCIUM 8.4* 8.8* 8.8*  MG 1.5* 2.1 2.2  PHOS 3.8 3.3 3.2    CBC Recent Labs  Lab 05/22/17 0334 05/23/17 0802 05/24/17 0306  WBC 10.3 11.6* 11.0*  HGB 11.3* 11.9* 12.8*  HCT 34.2* 36.2* 38.5*  PLT 357 367 292    Coag's No results for input(s): APTT, INR in the last 168 hours.  Sepsis Markers No results for input(s): LATICACIDVEN, PROCALCITON, O2SATVEN in the last 168 hours.  ABG Recent Labs  Lab 05/22/17 0325  PHART 7.339*  PCO2ART 36.6  PO2ART 183*    Liver Enzymes Recent Labs  Lab 05/17/17 1015 05/23/17 0802 05/24/17 0306  AST 21  --   --   ALT 14  --   --   ALKPHOS 79  --   --   BILITOT <0.2  --   --   ALBUMIN 3.8 3.0* 3.0*    Cardiac Enzymes No results for input(s): TROPONINI, PROBNP in the last 168 hours.  Glucose Recent Labs  Lab 05/23/17 1126 05/23/17 1647 05/23/17 2014 05/24/17 0035 05/24/17 0333 05/24/17 0809  GLUCAP 108* 94 116* 111* 117* 90    Imaging No results found.   STUDIES:  CXR 11/10 >> stable ETT, mild cardiomegaly  CULTURES: None  ANTIBIOTICS: None  SIGNIFICANT EVENTS: 11/9 >> admitted for angioedema requiring ETT for airway pritection  LINES/TUBES: ETT 11/10 >> PIV R-antecubital 11/10 >> PIV L-distal arm 11/10 >> PIV L-distal arm 11/10 >>  DISCUSSION: Patient is a 66 year old male presenting with hypertension and angioedema likely secondary to lisinopril use requiring intubation for airway protection with acute respiratory failure.  ASSESSMENT / PLAN:  PULMONARY Acute respiratory failure with difficult airway secondary to angioedema  - Continue full vent support, may need extubation in OR - ENT following, appreciate recommendations, plan to extubate when air leak is detected - Cultures extubation - Continuous  pulse ox - Continue Pepcid IV BID and Benadryl IV Q6H - Solu-Medrol 40 mg IV Q6H - Oral hygiene while intubated  CARDIOVASCULAR Chronic diastolic heart failure Primary hypertension - Discontinuing lisinopril given likely offending agent for angioedema - Holding home Lopressor given borderline bradycardia - Hydralazine 5-10 mg IV Q6H PRN for SBP >180 - Cardiac monitoring  RENAL Hypokalemia: Resolved Hypomagnesemia: Improving - Daily renal function panel, magnesium level - KVO  GASTROINTESTINAL Nutrition - NPO, holding tube feeds - Pepcid IV BID for SUP  HEMATOLOGIC Anemia: Stable, no signs of active bleeding Daily CBC - SCDs / heparin SQ for DVT prophylaxis  INFECTIOUS  No infectious processes - Daily CBC  ENDOCRINE Hyperglycemia: Likely secondary to steroid use, no history of DM (A1c 6.0) - Accu-Chek Q4H sensitive SSI  NEUROLOGIC   Sedation while on endotracheal ventilation  - RASS goal: -2 - Midazolam 1-4 mg IV Q1 agitationH PRN  - Daily thiamine - Soft restraints for medical non-violent reasons   FAMILY  - Updates: No family at bedside this morning - Inter-disciplinary family meet or Palliative Care meeting due by: 05/28/2017   Harriet [place], [place]

## 2017-05-24 NOTE — Progress Notes (Signed)
Nutrition Consult / Follow-up  DOCUMENTATION CODES:   Not applicable  INTERVENTION:    Vital High Protein at 60 ml/h (1440 ml per day)  Provides 1440 kcal (1889 kcal total with Propofol), 126 gm protein, 1204 ml free water daily  NUTRITION DIAGNOSIS:   Inadequate oral intake related to inability to eat as evidenced by NPO status.  Ongoing  GOAL:   Patient will meet greater than or equal to 90% of their needs  Unmet  MONITOR:   Vent status, Skin, Labs, Weight trends, I & O's  REASON FOR ASSESSMENT:   Consult Enteral/tube feeding initiation and management  ASSESSMENT:   66 y.o. male with known history of essential hypertension, chronic diastolic congestive heart failure, severe eczema, and alcohol dependence. Presented to the emergency department 11/9 with swelling of the face, airway, and difficulty swallowing after starting lisinopril 4 days prior.   Discussed patient in ICU rounds and with RN today. Patient remains intubated on ventilator support MV: 9.9 L/min Temp (24hrs), Avg:97.6 F (36.4 C), Min:97.4 F (36.3 C), Max:97.8 F (36.6 C)  Propofol: 17 ml/hr providing 449 kcal from lipids   Labs and medications reviewed.  I/O +3.7 L since admission  Diet Order:  Diet NPO time specified  EDUCATION NEEDS:   No education needs have been identified at this time  Skin:  Skin Assessment: Reviewed RN Assessment  Last BM:  PTA  Height:   Ht Readings from Last 1 Encounters:  05/22/17 5\' 10"  (1.778 m)    Weight:   Wt Readings from Last 1 Encounters:  05/24/17 211 lb 3.2 oz (95.8 kg)    Ideal Body Weight:  75.5 kg  BMI:  29.73 kg/m using admission weight  Estimated Nutritional Needs:   Kcal:  1940  Protein:  110-125 grams  Fluid:  1.9-2.1 L   Molli Barrows, RD, LDN, [place] Pager [phone] After Hours Pager [phone]

## 2017-05-25 ENCOUNTER — Inpatient Hospital Stay (HOSPITAL_COMMUNITY): Payer: Medicare HMO

## 2017-05-25 DIAGNOSIS — T783XXA Angioneurotic edema, initial encounter: Secondary | ICD-10-CM

## 2017-05-25 LAB — COMPREHENSIVE METABOLIC PANEL
ALK PHOS: 59 U/L (ref 38–126)
ALT: 11 U/L — AB (ref 17–63)
AST: 18 U/L (ref 15–41)
Albumin: 2.6 g/dL — ABNORMAL LOW (ref 3.5–5.0)
Anion gap: 7 (ref 5–15)
BUN: 33 mg/dL — AB (ref 6–20)
CALCIUM: 8.7 mg/dL — AB (ref 8.9–10.3)
CO2: 20 mmol/L — AB (ref 22–32)
CREATININE: 1.31 mg/dL — AB (ref 0.61–1.24)
Chloride: 114 mmol/L — ABNORMAL HIGH (ref 101–111)
GFR, EST NON AFRICAN AMERICAN: 56 mL/min — AB (ref >60)
Glucose, Bld: 144 mg/dL — ABNORMAL HIGH (ref 65–99)
Potassium: 3.6 mmol/L (ref 3.5–5.1)
SODIUM: 141 mmol/L (ref 135–145)
Total Bilirubin: 0.4 mg/dL (ref 0.3–1.2)
Total Protein: 5.9 g/dL — ABNORMAL LOW (ref 6.5–8.1)

## 2017-05-25 LAB — CBC
HCT: 36.2 % — ABNORMAL LOW (ref 39.0–52.0)
HEMOGLOBIN: 11.9 g/dL — AB (ref 13.0–17.0)
MCH: 29.9 pg (ref 26.0–34.0)
MCHC: 32.9 g/dL (ref 30.0–36.0)
MCV: 91 fL (ref 78.0–100.0)
Platelets: 322 10*3/uL (ref 150–400)
RBC: 3.98 MIL/uL — AB (ref 4.22–5.81)
RDW: 13.7 % (ref 11.5–15.5)
WBC: 9.8 10*3/uL (ref 4.0–10.5)

## 2017-05-25 LAB — GLUCOSE, CAPILLARY
GLUCOSE-CAPILLARY: 127 mg/dL — AB (ref 65–99)
GLUCOSE-CAPILLARY: 110 mg/dL — AB (ref 65–99)
GLUCOSE-CAPILLARY: 68 mg/dL (ref 65–99)
GLUCOSE-CAPILLARY: 98 mg/dL (ref 65–99)
GLUCOSE-CAPILLARY: 90 mg/dL (ref 65–99)
Glucose-Capillary: 103 mg/dL — ABNORMAL HIGH (ref 65–99)
Glucose-Capillary: 125 mg/dL — ABNORMAL HIGH (ref 65–99)
Glucose-Capillary: 93 mg/dL (ref 65–99)

## 2017-05-25 LAB — MAGNESIUM: Magnesium: 2.1 mg/dL (ref 1.7–2.4)

## 2017-05-25 LAB — PHOSPHORUS: Phosphorus: 2.8 mg/dL (ref 2.5–4.6)

## 2017-05-25 MED ORDER — DEXTROSE 50 % IV SOLN
INTRAVENOUS | Status: AC
  Administered 2017-05-25: 25 mL
  Filled 2017-05-25: qty 50

## 2017-05-25 MED ORDER — ORAL CARE MOUTH RINSE
15.0000 mL | Freq: Two times a day (BID) | OROMUCOSAL | Status: DC
  Administered 2017-05-26: 15 mL via OROMUCOSAL

## 2017-05-25 MED ORDER — SODIUM CHLORIDE 0.9 % IV SOLN
INTRAVENOUS | Status: DC
  Administered 2017-05-25: 12:00:00 via INTRAVENOUS

## 2017-05-25 NOTE — Progress Notes (Signed)
PULMONARY / CRITICAL CARE MEDICINE   Name: Raymond Stone MRN: [id number] DOB: 02-May-1951    ADMISSION DATE:  05/21/2017 CONSULTATION DATE:  05/21/2017  REFERRING MD:  Dr. Levonne Lapping  CHIEF COMPLAINT:  Angioedema  HISTORY OF PRESENT ILLNESS: 66 y.o. male with known history of essential hypertension, chronic diastolic congestive heart failure, severe eczema, and alcohol dependence. Presented to the emergency department 11/9 with swelling of the face, airway, and difficulty swallowing after starting lisinopril 4 days prior. Subsequently given Benadryl and steroids with progressive worsening. Given epinephrine without improvement. ENT consulted and bedside laryngoscopy revealed laryngeal edema with possible airway collapse. Patient was subsequently intubated and admitted to the ICU.  PAST MEDICAL HISTORY :  He  has a past medical history of Anemia, Eczema, Fluid collection (edema) in the arms, legs, hands and feet, and Hypertension.  PAST SURGICAL HISTORY: He  has a past surgical history that includes Colonoscopy and IRRIGATION AND DEBRIDEMENT EXTREMITY  RIGHT MIDDLE FINGER WITH REVISION AMPUTATION AND SKIN GRAFTING. (Right, 09/13/2012).  Allergies  Allergen Reactions  . Lisinopril Swelling    ANGIOEDEMA    No current facility-administered medications on file prior to encounter.    Current Outpatient Medications on File Prior to Encounter  Medication Sig  . bisacodyl (DULCOLAX) 5 MG EC tablet Take 1 tablet (5 mg total) by mouth daily as needed for moderate constipation.  Marland Kitchen desonide (DESOWEN) 0.05 % ointment APPLY A SMALL AMOUNT TOPICALLY TWICE A DAY. FOR 14 DAYS. (Patient taking differently: Apply 1 application daily after lunch topically. For eczema on face)  . docusate sodium (COLACE) 100 MG capsule Take 100 mg 2 (two) times daily as needed by mouth for mild constipation.  . ferrous sulfate 325 (65 FE) MG tablet Take 1 tablet (325 mg total) by mouth 2 (two) times daily with a meal. (Patient  taking differently: Take 325 mg daily with breakfast by mouth. )  . fluconazole (DIFLUCAN) 200 MG tablet Take 1 tablet (200 mg total) as directed by mouth. Take 2 tablets on day one, then 1 tablet daily thereafter. (Patient taking differently: Take 200-400 mg See admin instructions by mouth. Take 2 tablets on day one, then 1 tablet daily for 2 weeks (#15 filled 05/18/17))  . hydrOXYzine (ATARAX/VISTARIL) 25 MG tablet Take 25 mg 3 (three) times daily as needed by mouth for itching.  . metoprolol tartrate (LOPRESSOR) 25 MG tablet Take 1 tablet (25 mg total) daily by mouth.  . Multiple Vitamin (MULTIVITAMIN WITH MINERALS) TABS tablet Take 1 tablet daily by mouth.  . thiamine 100 MG tablet Take 1 tablet (100 mg total) daily by mouth.  . traMADol (ULTRAM) 50 MG tablet Take 50 mg every 6 (six) hours as needed by mouth (pain).  . triamcinolone ointment (KENALOG) 0.1 % Apply 0.1 application daily after lunch topically. For eczema on all areas except face  . Dupilumab (DUPIXENT ) Inject 1 application every 14 (fourteen) days into the skin.  . folic acid (FOLVITE) 1 MG tablet Take 1 tablet (1 mg total) daily by mouth.    FAMILY HISTORY:  His indicated that his mother is deceased. He indicated that his father is deceased. He indicated that the status of his brother is unknown. He indicated that his maternal grandmother is deceased. He indicated that his maternal grandfather is deceased. He indicated that his paternal grandmother is deceased. He indicated that his paternal grandfather is deceased. He indicated that the status of his daughter is unknown. He indicated that the status of his neg hx  is unknown.   SOCIAL HISTORY: He  reports that  has never smoked. he has never used smokeless tobacco. He reports that he drinks about 1.8 - 2.4 oz of alcohol per week. He reports that he does not use drugs.  REVIEW OF SYSTEMS:   Unable to obtain given sedation and intubation.  SUBJECTIVE:  Afebrile with stable  vitals. No overnight events. Remains sedated on vent.  VITAL SIGNS: BP 108/68   Pulse 61   Temp (!) 97.4 F (36.3 C) (Oral)   Resp 16   Ht 5\' 10"  (1.778 m)   Wt 214 lb 11.7 oz (97.4 kg)   SpO2 96%   BMI 30.81 kg/m   HEMODYNAMICS:    VENTILATOR SETTINGS: Vent Mode: PRVC FiO2 (%):  [30 %] 30 % Set Rate:  [16 bmp] 16 bmp Vt Set:  [580 mL] 580 mL PEEP:  [5 cmH20] 5 cmH20 Plateau Pressure:  [10 QAS34-19 cmH20] 10 cmH20  INTAKE / OUTPUT: I/O last 3 completed shifts: In: 2594.5 [I.V.:1904.5; NG/GT:140; IV Piggyback:550] Out: 943 [Urine:943] UOP: 0.725 L last 24 hours  PHYSICAL EXAMINATION: General: intubated and sedated, NAD HEENT: normocephalic, atraumatic, moist mucous membranes, mild angioedema present on lower lip Neck: supple, non-tender without lymphadenopathy Cardiovascular: regular rate and rhythm without murmurs, rubs, or gallops Lungs: clear to auscultation bilaterally with normal work of breathing Abdomen: soft, non-tender, non-distended, normoactive bowel sounds Skin: warm, dry, no rashes or lesions, cap refill < 2 seconds Extremities: warm and well perfused, normal tone, no edema Neuro: sedated, not moving extremities, PERRLA  LABS:  BMET Recent Labs  Lab 05/23/17 0802 05/24/17 0306 05/25/17 0330  NA 139 141 141  K 3.8 3.5 3.6  CL 110 111 114*  CO2 20* 21* 20*  BUN 21* 25* 33*  CREATININE 1.03 1.32* 1.31*  GLUCOSE 118* 125* 144*    Electrolytes Recent Labs  Lab 05/23/17 0802 05/24/17 0306 05/24/17 0950 05/24/17 1802 05/25/17 0330  CALCIUM 8.8* 8.8*  --   --  8.7*  MG 2.1 2.2 2.1 2.3 2.1  PHOS 3.3 3.2 3.7 3.7 2.8    CBC Recent Labs  Lab 05/23/17 0802 05/24/17 0306 05/25/17 0330  WBC 11.6* 11.0* 9.8  HGB 11.9* 12.8* 11.9*  HCT 36.2* 38.5* 36.2*  PLT 367 292 322    Coag's No results for input(s): APTT, INR in the last 168 hours.  Sepsis Markers No results for input(s): LATICACIDVEN, PROCALCITON, O2SATVEN in the last 168  hours.  ABG Recent Labs  Lab 05/22/17 0325  PHART 7.339*  PCO2ART 36.6  PO2ART 183*    Liver Enzymes Recent Labs  Lab 05/23/17 0802 05/24/17 0306 05/25/17 0330  AST  --   --  18  ALT  --   --  11*  ALKPHOS  --   --  59  BILITOT  --   --  0.4  ALBUMIN 3.0* 3.0* 2.6*    Cardiac Enzymes No results for input(s): TROPONINI, PROBNP in the last 168 hours.  Glucose Recent Labs  Lab 05/24/17 1143 05/24/17 1524 05/24/17 2016 05/25/17 0013 05/25/17 0415 05/25/17 0825  GLUCAP 72 81 93 125* 127* 110*    Imaging Dg Chest Port 1 View  Result Date: 05/25/2017 CLINICAL DATA:  Respiratory failure. EXAM: PORTABLE CHEST 1 VIEW COMPARISON:  05/24/2017 . FINDINGS: Endotracheal tube again noted 7 cm above the carina. NG tube in stable position. Stable mild cardiomegaly. Progressive basilar atelectasis/infiltrates. Small left pleural effusion. No pneumothorax . IMPRESSION: 1. Endotracheal tube again noted is  7 cm above the carina. NG tube in stable position. 2. Progressive bibasilar atelectasis/ infiltrates. Small left pleural effusion . Electronically Signed   By: Marcello Moores  Register   On: 05/25/2017 06:56   Dg Chest Port 1 View  Result Date: 05/25/2017 CLINICAL DATA:  Nasogastric tube placement. EXAM: PORTABLE CHEST 1 VIEW COMPARISON:  Chest radiograph performed 05/22/2017 FINDINGS: The patient's endotracheal tube is seen ending 7 cm above the carina. An enteric tube is noted ending overlying the antrum of the stomach. The lungs are well-aerated and clear. There is no evidence of focal opacification, pleural effusion or pneumothorax. The cardiomediastinal silhouette is mildly enlarged. No acute osseous abnormalities are seen. IMPRESSION: 1. Enteric tube noted ending overlying the antrum of the stomach. 2. Endotracheal tube seen ending 7 cm above the carina. This could be advanced 3 cm, as deemed clinically appropriate. 3. No acute cardiopulmonary process seen.  Mild cardiomegaly.  Electronically Signed   By: Garald Balding M.D.   On: 05/25/2017 00:05   Dg Abd Portable 1v  Result Date: 05/24/2017 CLINICAL DATA:  66 year old male status post feeding tube placement. EXAM: PORTABLE ABDOMEN - 1 VIEW COMPARISON:  Portable chest 05/22/2017. FINDINGS: Portable AP semi upright view at 1427 hours. Enteric tube courses to the gastric body. The side hole is not identified. Stable visible lung bases. Paucity of upper abdominal bowel gas outside of the stomach. IMPRESSION: Enteric tube terminates in the gastric body. Electronically Signed   By: Genevie Ann M.D.   On: 05/24/2017 14:44     STUDIES:  CXR 11/10 >> stable ETT, mild cardiomegaly ABX 11/12 >> NG/OG tube in proper position CXR 11/12 >> enteric tube stable, ETT should be advanced 3 cm, mild cardiomegaly CXR 11/13 >> progressive bibasilar atelectasis/infiltrates, small left pleural effusion  CULTURES: None  ANTIBIOTICS: None  SIGNIFICANT EVENTS: 11/9 >> admitted for angioedema requiring ETT for airway pritection  LINES/TUBES: ETT 11/10 >> PIV R-antecubital 11/9 > 11/9 PIV L-distal upper arm 11/10 >> PIV L-distal arm 11/10 >> Urethral catheter 11/10 >> NG/OG 11/12 >>  DISCUSSION: Patient is a 66 year old male presenting with hypertension and angioedema likely secondary to lisinopril use requiring intubation for airway protection with acute respiratory failure.  ASSESSMENT / PLAN:  PULMONARY Acute respiratory failure with difficult airway secondary to angioedema  - Continue full vent support, may need extubation in OR - ENT following, appreciate recommendations, plan to extubate when air leak is detected - Cultures extubation - Continuous pulse ox - Continue Pepcid IV BID and Benadryl IV 25 mg Q6H - Solu-Medrol 40 mg IV BID - Oral hygiene while intubated  CARDIOVASCULAR Chronic diastolic heart failure Primary hypertension - Discontinuing lisinopril given likely offending agent for angioedema - Holding  home Lopressor given borderline bradycardia - Hydralazine 5-10 mg IV Q6H PRN for SBP >180 - Cardiac monitoring  RENAL Hypokalemia: resolved Hypomagnesemia: resolved - Daily renal function panel, magnesium level - KVO  GASTROINTESTINAL Nutrition - NPO, holding tube feeds - Pepcid IV BID for SUP  HEMATOLOGIC Anemia: stable, no signs of active bleeding - Daily CBC - SCDs / heparin SQ for DVT prophylaxis  INFECTIOUS  No infectious processes - Daily CBC  ENDOCRINE Hyperglycemia: likely secondary to steroid use, no history of DM (A1c 6.0) - Accu-Chek Q4H sensitive SSI  NEUROLOGIC   Sedation while on endotracheal ventilation  - RASS goal: -2 - Sedation: fentanyl gtt / propofol gtt / fentanyl and midazolam PRN agitation - Daily thiamine - Soft restraints for medical non-violent reasons  FAMILY  - Updates: no family at bedside this morning - Inter-disciplinary family meet or Palliative Care meeting due by: 05/28/2017   Harriet [place], [place]

## 2017-05-25 NOTE — Progress Notes (Signed)
Wasted 286mL of Fentanyl in the sink with Tula Nakayama. RN

## 2017-05-25 NOTE — Progress Notes (Signed)
Subjective: On vent. Resting comfortably. No new issues.  Objective: Vital signs in last 24 hours: Temp:  [96.7 F (35.9 C)-98.3 F (36.8 C)] 97.1 F (36.2 C) (11/13 0400) Pulse Rate:  [55-73] 62 (11/13 0600) Resp:  [5-17] 16 (11/13 0600) BP: (122-175)/(75-96) 122/75 (11/13 0600) SpO2:  [94 %-100 %] 96 % (11/13 0600) FiO2 (%):  [30 %] 30 % (11/13 0400) Weight:  [97.4 kg (214 lb 11.7 oz)] 97.4 kg (214 lb 11.7 oz) (11/13 0500)  Physical Exam Constitutional: He appearswell-developedand well-nourished.Sedated. Head:Normocephalicand atraumatic. Ears: Normal auricles and EACs. Nose: Normal mucosa, septum, and turbinates. Mouth:The angioedema has resolved. Eyes:Conjunctivaeare normal.PERRL, EOMI. Neck:Neck supple.No mass. Trachea midline. Cardiovascular:Normal rateand regular rhythm. Musculoskeletal: He exhibits noedema.  Neurological:Sedated, on vent. Skin: Skin iswarmand dry.  Vitalsreviewed.  Recent Labs    05/24/17 0306 05/25/17 0330  WBC 11.0* 9.8  HGB 12.8* 11.9*  HCT 38.5* 36.2*  PLT 292 322   Recent Labs    05/24/17 0306 05/25/17 0330  NA 141 141  K 3.5 3.6  CL 111 114*  CO2 21* 20*  GLUCOSE 125* 144*  BUN 25* 33*  CREATININE 1.32* 1.31*  CALCIUM 8.8* 8.7*    Medications:  I have reviewed the patient's current medications. Scheduled: . chlorhexidine gluconate (MEDLINE KIT)  15 mL Mouth Rinse BID  . diphenhydrAMINE  25 mg Intravenous Q6H  . feeding supplement (VITAL HIGH PROTEIN)  1,000 mL Per Tube Q24H  . folic acid  1 mg Intravenous Daily  . heparin  5,000 Units Subcutaneous Q8H  . insulin aspart  0-9 Units Subcutaneous Q4H  . mouth rinse  15 mL Mouth Rinse QID  . methylPREDNISolone (SOLU-MEDROL) injection  40 mg Intravenous Q12H  . thiamine injection  100 mg Intravenous Daily   Continuous: . sodium chloride 250 mL (05/25/17 0400)  . famotidine (PEPCID) IV Stopped (05/24/17 2145)  . fentaNYL infusion INTRAVENOUS 200 mcg/hr  (05/25/17 0549)  . propofol (DIPRIVAN) infusion 40 mcg/kg/min (05/25/17 0549)   STM:HDQQIW chloride, fentaNYL, hydrALAZINE, midazolam  Assessment/Plan: Angioedema secondary to the use of lisinopril. His swelling has resolved. May extubate when airleak is detected. The patient has normal pharyngeal and laryngeal anatomy. His initial airway difficulty was entirely due to his angioedema.    LOS: 3 days   Miela Desjardin W Eder Macek 05/25/2017, 7:03 AM

## 2017-05-25 NOTE — Procedures (Signed)
Extubation Procedure Note  Patient Details:   Name: Colbe Viviano DOB: 03/17/1951 MRN: [id number]   Airway Documentation:  Airway 7.5 mm (Active)  Secured at (cm) 25 cm 05/25/2017  7:58 AM  Measured From Lips 05/25/2017  7:58 AM  Secured Location Left 05/25/2017  7:58 AM  Secured By [employer] 05/25/2017  7:58 AM  Tube Holder Repositioned Yes 05/25/2017  7:58 AM  Cuff Pressure (cm H2O) 26 cm H2O 05/24/2017  7:30 PM  Site Condition Dry 05/25/2017  7:58 AM    Evaluation  O2 sats: stable throughout Complications: No apparent complications Patient did tolerate procedure well. Bilateral Breath Sounds: Clear   Yes   Patient extubated to [place]. Vital signs stable at this time. No complications. RN at bedside. RT will continue to monitor.  Mcneil Sober 05/25/2017, 10:37 AM

## 2017-05-26 ENCOUNTER — Other Ambulatory Visit: Payer: Self-pay

## 2017-05-26 DIAGNOSIS — T783XXA Angioneurotic edema, initial encounter: Secondary | ICD-10-CM

## 2017-05-26 LAB — BASIC METABOLIC PANEL
ANION GAP: 7 (ref 5–15)
BUN: 26 mg/dL — ABNORMAL HIGH (ref 6–20)
CALCIUM: 8.9 mg/dL (ref 8.9–10.3)
CO2: 21 mmol/L — AB (ref 22–32)
Chloride: 115 mmol/L — ABNORMAL HIGH (ref 101–111)
Creatinine, Ser: 1.16 mg/dL (ref 0.61–1.24)
GLUCOSE: 102 mg/dL — AB (ref 65–99)
POTASSIUM: 3.9 mmol/L (ref 3.5–5.1)
Sodium: 143 mmol/L (ref 135–145)

## 2017-05-26 LAB — GLUCOSE, CAPILLARY
Glucose-Capillary: 114 mg/dL — ABNORMAL HIGH (ref 65–99)
Glucose-Capillary: 76 mg/dL (ref 65–99)
Glucose-Capillary: 97 mg/dL (ref 65–99)
Glucose-Capillary: 97 mg/dL (ref 65–99)

## 2017-05-26 LAB — MAGNESIUM: Magnesium: 2 mg/dL (ref 1.7–2.4)

## 2017-05-26 MED ORDER — FOLIC ACID 1 MG PO TABS: 1.0000 mg | ORAL_TABLET | Freq: Every day | ORAL | Status: DC

## 2017-05-26 MED ORDER — AMOXICILLIN 500 MG PO TABS: 1000.0000 mg | ORAL_TABLET | Freq: Two times a day (BID) | ORAL | 0 refills | Status: DC

## 2017-05-26 MED ORDER — PREDNISONE 5 MG PO TABS: ORAL_TABLET | ORAL | 0 refills | Status: DC

## 2017-05-26 MED ORDER — VITAMIN B-1 100 MG PO TABS: 100.0000 mg | ORAL_TABLET | Freq: Every day | ORAL | Status: DC

## 2017-05-26 MED ORDER — METRONIDAZOLE 500 MG PO TABS: 500.0000 mg | ORAL_TABLET | Freq: Two times a day (BID) | ORAL | 0 refills | Status: DC

## 2017-05-26 MED ORDER — CLARITHROMYCIN 500 MG PO TABS: 500.0000 mg | ORAL_TABLET | Freq: Two times a day (BID) | ORAL | 0 refills | Status: DC

## 2017-05-26 MED ORDER — OMEPRAZOLE 40 MG PO CPDR: 40.0000 mg | DELAYED_RELEASE_CAPSULE | Freq: Two times a day (BID) | ORAL | 0 refills | Status: DC

## 2017-05-26 NOTE — Progress Notes (Signed)
Subjective: Resting comfortably in bed. No complaint. Extubated yesterday.  Objective: Vital signs in last 24 hours: Temp:  [98 F (36.7 C)-99 F (37.2 C)] 98 F (36.7 C) (11/14 0811) Pulse Rate:  [60-113] 87 (11/14 0600) Resp:  [13-22] 16 (11/14 0600) BP: (95-173)/(68-115) 141/86 (11/14 0600) SpO2:  [92 %-99 %] 97 % (11/14 0600) Weight:  [95.6 kg (210 lb 12.2 oz)] 95.6 kg (210 lb 12.2 oz) (11/14 0112)  Physical Exam Constitutional: He appearswell-developedand well-nourished.Awake and alert. Head:Normocephalicand atraumatic. Ears: Normal auricles and EACs. Nose: Normal mucosa, septum, and turbinates. Mouth:The angioedema has resolved. Mucosa normal. Eyes:Conjunctivaeare normal.PERRL, EOMI. Neck:Neck supple.No mass. Trachea midline. Cardiovascular:Normal rateand regular rhythm. Pulm: Normal respiratory effort. No stridor. Musculoskeletal: He exhibits noedema.  Neurological:Sedated, on vent. Skin: Skin iswarmand dry. Vitalsreviewed.    Recent Labs    05/24/17 0306 05/25/17 0330  WBC 11.0* 9.8  HGB 12.8* 11.9*  HCT 38.5* 36.2*  PLT 292 322   Recent Labs    05/25/17 0330 05/26/17 0316  NA 141 143  K 3.6 3.9  CL 114* 115*  CO2 20* 21*  GLUCOSE 144* 102*  BUN 33* 26*  CREATININE 1.31* 1.16  CALCIUM 8.7* 8.9    Medications:  I have reviewed the patient's current medications. Scheduled: . diphenhydrAMINE  25 mg Intravenous K4M  . folic acid  1 mg Intravenous Daily  . heparin  5,000 Units Subcutaneous Q8H  . insulin aspart  0-9 Units Subcutaneous Q4H  . mouth rinse  15 mL Mouth Rinse BID  . methylPREDNISolone (SOLU-MEDROL) injection  40 mg Intravenous Q12H  . thiamine injection  100 mg Intravenous Daily   Continuous: . sodium chloride 50 mL/hr at 05/25/17 1130  . famotidine (PEPCID) IV Stopped (05/25/17 2325)   WNU:[id number]  Assessment/Plan: Angioedema - resolved. Extubated yesterday. No other intervention recommended. Avoid all  ACE inhibitors. Pt may follow up with me PRN.   LOS: 4 days   Raymond Stone W Lamontae Ricardo 05/26/2017, 8:43 AM

## 2017-05-26 NOTE — Discharge Instructions (Signed)
You were hospitalized due to angioedema after receiving lisinopril.  We updated your allergy list to reflect this.  It is important to avoid all ACE inhibitors and ARB's.  We have also given you a steroid taper.  Please follow these instructions: 11/15: Prednisone 20 mg daily with meal 11/16: Prednisone 15mg  daily with meal 11/17: Prednisone 15mg  daily with meal 11/18: Prednisone 10mg  daily with meal 11/19: Prednisone 5 mg daily with meal  You will follow up with [facility] in 1 week for hospital follow up. Please call them Monday to schedule the appointment.

## 2017-05-26 NOTE — Discharge Summary (Signed)
Physician Discharge Summary  Patient ID: Fortune Brannigan MRN: [id number] DOB/AGE: Feb 22, 1951 66 y.o.  Admit date: 05/21/2017 Discharge date: 05/26/2017    Discharge Diagnoses:  Acute respiratory failure with difficult airway secondary to angioedema, resolved Chronic diastolic heart failure, stable Primary hypertension, stable Hypokalemia, resolved Hypomagnesemia, resolved Hyperglycemia, resolved                                                                     DISCHARGE PLAN BY DIAGNOSIS    Acute respiratory failure with difficult airway secondary to angioedema, resolved: Continue prednisone taper over 5 days (11/15-11/19).  Patient did receive IV Benadryl throughout hospitalization which was discontinued prior to discharge.  Patient tolerating food by mouth following extubation on 11/13.  Chronic diastolic heart failure, stable: Patient's metoprolol tartrate 25 mg daily held during hospitalization.  Patient instructed to restart prior to discharge.  He did not present with signs of fluid overload during hospitalization.  Primary hypertension, stable: Patient's metoprolol tartrate 25 mg daily held during hospitalization.  Medication restarted prior to discharge.  Allergy updated to reflect angioedema to ACE inhibitors.                     DISCHARGE SUMMARY   Patient is a 66 year old male with known history of [place] hypertension, chronic diastolic heart failure, severe eczema, and alcohol dependence who presented to [facility] ED with swelling of the face, airway, and dysphasia on 11/9 following initiation of the lisinopril 4 days prior.  He was subsequently given Benadryl and steroids with progressive worsening.  Patient also received epinephrine without improvement.  ENT consulted at bedside with laryngoscopy revealing laryngeal edema with possible airway collapse prompting intubation on 11/10.  Patient remained intubated for a total of 4 days and was extubated on 11/13.  ENT  followed alongside.  He received IV Benadryl and Solu-Medrol throughout his hospitalization.  Patient able to tolerate food by mouth following extubation with complete resolution of facial edema and was stable for discharge on 11/14.             SIGNIFICANT DIAGNOSTIC STUDIES CXR 11/10 >> stable ETT, mild cardiomegaly ABX 11/12 >> NG/OG tube in proper position CXR 11/12 >> enteric tube stable, ETT should be advanced 3 cm, mild cardiomegaly CXR 11/13 >> progressive bibasilar atelectasis/infiltrates, small left pleural effusion  MICRO DATA  None  ANTIBIOTICS None  CONSULTS ENT Dr. Benjamine Mola, Su  TUBES / LINES ETT 11/10-11/13 PIV R-antecubital 11/09-11/09 PIV L-distal upper arm 11/10-11/14 PIV L-distal arm 11/10-11/14 Urethral catheter 11/10-11/14 NG/OG 11/12-11/13  Discharge Exam: General: well nourished, well developed, NAD with non-toxic appearance HEENT: normocephalic, atraumatic, moist mucous membranes, minimal edema on lower lip Neck: supple, non-tender without lymphadenopathy Cardiovascular: regular rate and rhythm without murmurs, rubs, or gallops Lungs: clear to auscultation bilaterally with normal work of breathing Abdomen: soft, non-tender, non-distended, normoactive bowel sounds Skin: warm, dry, no rashes or lesions, cap refill < 2 seconds Extremities: warm and well perfused, normal tone, no edema Neuro: able to move upper and lower extremities, no dyarthria  Vitals:   05/26/17 0600 05/26/17 0811 05/26/17 0900 05/26/17 1000  BP: (!) 141/86  105/66 (!) 146/93  Pulse: 87  97 86  Resp: '16  19 18  ' Temp:  98  F (36.7 C)    TempSrc:  Oral    SpO2: 97%  91% 93%  Weight:      Height:         Discharge Labs  BMET Recent Labs  Lab 05/22/17 0334 05/23/17 0802 05/24/17 0306 05/24/17 0950 05/24/17 1802 05/25/17 0330 05/26/17 0316  NA 139 139 141  --   --  141 143  K 4.0 3.8 3.5  --   --  3.6 3.9  CL 110 110 111  --   --  114* 115*  CO2 21* 20* 21*  --   --   20* 21*  GLUCOSE 157* 118* 125*  --   --  144* 102*  BUN 21* 21* 25*  --   --  33* 26*  CREATININE 1.15 1.03 1.32*  --   --  1.31* 1.16  CALCIUM 8.4* 8.8* 8.8*  --   --  8.7* 8.9  MG 1.5* 2.1 2.2 2.1 2.3 2.1 2.0  PHOS 3.8 3.3 3.2 3.7 3.7 2.8  --     CBC Recent Labs  Lab 05/23/17 0802 05/24/17 0306 05/25/17 0330  HGB 11.9* 12.8* 11.9*  HCT 36.2* 38.5* 36.2*  WBC 11.6* 11.0* 9.8  PLT 367 292 322    Anti-Coagulation No results for input(s): INR in the last 168 hours.  Discharge Instructions    Diet - low sodium heart healthy   Complete by:  As directed    Increase activity slowly   Complete by:  As directed        Follow-up Information    Alfonse Spruce, FNP. Call on 05/31/2017.   Specialty:  Family Medicine Why:  Call Monday and request a hospital follow up any day next week. Contact information: [place] [address] [phone]            Allergies as of 05/26/2017      Reactions   Lisinopril Swelling   ANGIOEDEMA      Medication List    TAKE these medications   bisacodyl 5 MG EC tablet Commonly known as:  DULCOLAX Take 1 tablet (5 mg total) by mouth daily as needed for moderate constipation.   desonide 0.05 % ointment Commonly known as:  DESOWEN APPLY A SMALL AMOUNT TOPICALLY TWICE A DAY. FOR 14 DAYS. What changed:    how much to take  how to take this  when to take this  additional instructions   docusate sodium 100 MG capsule Commonly known as:  COLACE Take 100 mg 2 (two) times daily as needed by mouth for mild constipation.   DUPIXENT [place] Inject 1 application every 14 (fourteen) days into the skin.   ferrous sulfate 325 (65 FE) MG tablet Take 1 tablet (325 mg total) by mouth 2 (two) times daily with a meal. What changed:  when to take this   fluconazole 200 MG tablet Commonly known as:  DIFLUCAN Take 1 tablet (200 mg total) as directed by mouth. Take 2 tablets on day one, then 1 tablet daily  thereafter. What changed:    how much to take  when to take this  additional instructions   folic acid 1 MG tablet Commonly known as:  FOLVITE Take 1 tablet (1 mg total) daily by mouth.   hydrOXYzine 25 MG tablet Commonly known as:  ATARAX/VISTARIL Take 25 mg 3 (three) times daily as needed by mouth for itching.   metoprolol tartrate 25 MG tablet Commonly known as:  LOPRESSOR Take 1  tablet (25 mg total) daily by mouth.   multivitamin with minerals Tabs tablet Take 1 tablet daily by mouth.   predniSONE 5 MG tablet Commonly known as:  DELTASONE Take 4 tablets by mouth with meal once daily starting 05/27/17, then 3 tabs once daily for 2 days, then 2 tabs once daily for 1 day, then 1 tab daily until complete.   thiamine 100 MG tablet Take 1 tablet (100 mg total) daily by mouth.   traMADol 50 MG tablet Commonly known as:  ULTRAM Take 50 mg every 6 (six) hours as needed by mouth (pain).   triamcinolone ointment 0.1 % Commonly known as:  KENALOG Apply 0.1 application daily after lunch topically. For eczema on all areas except face         Disposition: Home  Discharged Condition: Demonte Dobratz has met maximum benefit of inpatient care and is medically stable and cleared for discharge.  Patient is pending follow up as above.

## 2017-05-26 NOTE — Care Management Note (Addendum)
Case Management Note  Patient Details  Name: Raymond Stone MRN: [id number] Date of Birth: 15-Jan-1951  Subjective/Objective:     Pt admitted with angioedema                Action/Plan:   PTA from home.  Pt extubated 05/25/17.  CM will continue to follow for discharge needs   Expected Discharge Date:                  Expected Discharge Plan:  Home/Self Care  In-House Referral:     Discharge planning Services  CM Consult  Post Acute Care Choice:    Choice offered to:     DME Arranged:    DME Agency:     HH Arranged:    HH Agency:     Status of Service:     If discussed at [employer] of Stay Meetings, dates discussed:    Additional Comments: Pt will discharge home today, pt states he is independent, has PCP and denied barriers to obtaining and paying for medicaitons Maryclare Labrador, RN 05/26/2017, 11:03 AM

## 2017-05-26 NOTE — Progress Notes (Signed)
PULMONARY / CRITICAL CARE MEDICINE   Name: Raymond Stone MRN: [id number] DOB: Dec 01, 1950    ADMISSION DATE:  05/21/2017 CONSULTATION DATE:  05/21/2017  REFERRING MD:  Dr. Levonne Lapping  CHIEF COMPLAINT:  Angioedema  HISTORY OF PRESENT ILLNESS: 66 y.o. male with known history of essential hypertension, chronic diastolic congestive heart failure, severe eczema, and alcohol dependence. Presented to the emergency department 11/9 with swelling of the face, airway, and difficulty swallowing after starting lisinopril 4 days prior. Subsequently given Benadryl and steroids with progressive worsening. Given epinephrine without improvement. ENT consulted and bedside laryngoscopy revealed laryngeal edema with possible airway collapse. Patient was subsequently intubated and admitted to the ICU.  PAST MEDICAL HISTORY :  He  has a past medical history of Anemia, Eczema, Fluid collection (edema) in the arms, legs, hands and feet, and Hypertension.  PAST SURGICAL HISTORY: He  has a past surgical history that includes Colonoscopy and IRRIGATION AND DEBRIDEMENT EXTREMITY  RIGHT MIDDLE FINGER WITH REVISION AMPUTATION AND SKIN GRAFTING. (Right, 09/13/2012).  Allergies  Allergen Reactions  . Lisinopril Swelling    ANGIOEDEMA    No current facility-administered medications on file prior to encounter.    Current Outpatient Medications on File Prior to Encounter  Medication Sig  . bisacodyl (DULCOLAX) 5 MG EC tablet Take 1 tablet (5 mg total) by mouth daily as needed for moderate constipation.  Marland Kitchen desonide (DESOWEN) 0.05 % ointment APPLY A SMALL AMOUNT TOPICALLY TWICE A DAY. FOR 14 DAYS. (Patient taking differently: Apply 1 application daily after lunch topically. For eczema on face)  . docusate sodium (COLACE) 100 MG capsule Take 100 mg 2 (two) times daily as needed by mouth for mild constipation.  . ferrous sulfate 325 (65 FE) MG tablet Take 1 tablet (325 mg total) by mouth 2 (two) times daily with a meal. (Patient  taking differently: Take 325 mg daily with breakfast by mouth. )  . fluconazole (DIFLUCAN) 200 MG tablet Take 1 tablet (200 mg total) as directed by mouth. Take 2 tablets on day one, then 1 tablet daily thereafter. (Patient taking differently: Take 200-400 mg See admin instructions by mouth. Take 2 tablets on day one, then 1 tablet daily for 2 weeks (#15 filled 05/18/17))  . hydrOXYzine (ATARAX/VISTARIL) 25 MG tablet Take 25 mg 3 (three) times daily as needed by mouth for itching.  . metoprolol tartrate (LOPRESSOR) 25 MG tablet Take 1 tablet (25 mg total) daily by mouth.  . Multiple Vitamin (MULTIVITAMIN WITH MINERALS) TABS tablet Take 1 tablet daily by mouth.  . thiamine 100 MG tablet Take 1 tablet (100 mg total) daily by mouth.  . traMADol (ULTRAM) 50 MG tablet Take 50 mg every 6 (six) hours as needed by mouth (pain).  . triamcinolone ointment (KENALOG) 0.1 % Apply 0.1 application daily after lunch topically. For eczema on all areas except face  . Dupilumab (DUPIXENT [place]) Inject 1 application every 14 (fourteen) days into the skin.  . folic acid (FOLVITE) 1 MG tablet Take 1 tablet (1 mg total) daily by mouth.    FAMILY HISTORY:  His indicated that his mother is deceased. He indicated that his father is deceased. He indicated that the status of his brother is unknown. He indicated that his maternal grandmother is deceased. He indicated that his maternal grandfather is deceased. He indicated that his paternal grandmother is deceased. He indicated that his paternal grandfather is deceased. He indicated that the status of his daughter is unknown. He indicated that the status of his neg hx  is unknown.   SOCIAL HISTORY: He  reports that  has never smoked. he has never used smokeless tobacco. He reports that he drinks about 1.8 - 2.4 oz of alcohol per week. He reports that he does not use drugs.  REVIEW OF SYSTEMS:   Denies chest pain, shortness of breath, dysphagia, nausea or vomiting, fevers or  chills, abdominal pain.  SUBJECTIVE:  Afebrile with stable vitals.  No overnight events.  Was able to tolerate ice cream yesterday evening.  Has not yet had a bowel movement but endorsing flatus.  Patient hopeful to leave soon.  VITAL SIGNS: BP (!) 141/86   Pulse 87   Temp 98 F (36.7 C) (Oral)   Resp 16   Ht 5\' 10"  (1.778 m)   Wt 210 lb 12.2 oz (95.6 kg)   SpO2 97%   BMI 30.24 kg/m   HEMODYNAMICS:    VENTILATOR SETTINGS:    INTAKE / OUTPUT: I/O last 3 completed shifts: In: 2671.9 [P.O.:100; I.V.:1431.9; NG/GT:1040; IV Piggyback:100] Out: 3350 [Urine:3350] UOP: 2.90 L last 24 hours  PHYSICAL EXAMINATION: General: well nourished, well developed, NAD with non-toxic appearance HEENT: normocephalic, atraumatic, moist mucous membranes, minimal edema on lower lip Neck: supple, non-tender without lymphadenopathy Cardiovascular: regular rate and rhythm without murmurs, rubs, or gallops Lungs: clear to auscultation bilaterally with normal work of breathing Abdomen: soft, non-tender, non-distended, normoactive bowel sounds Skin: warm, dry, no rashes or lesions, cap refill < 2 seconds Extremities: warm and well perfused, normal tone, no edema Neuro: able to move upper and lower extremities, no dyarthria  LABS:  BMET Recent Labs  Lab 05/24/17 0306 05/25/17 0330 05/26/17 0316  NA 141 141 143  K 3.5 3.6 3.9  CL 111 114* 115*  CO2 21* 20* 21*  BUN 25* 33* 26*  CREATININE 1.32* 1.31* 1.16  GLUCOSE 125* 144* 102*    Electrolytes Recent Labs  Lab 05/24/17 0306 05/24/17 0950 05/24/17 1802 05/25/17 0330 05/26/17 0316  CALCIUM 8.8*  --   --  8.7* 8.9  MG 2.2 2.1 2.3 2.1 2.0  PHOS 3.2 3.7 3.7 2.8  --     CBC Recent Labs  Lab 05/23/17 0802 05/24/17 0306 05/25/17 0330  WBC 11.6* 11.0* 9.8  HGB 11.9* 12.8* 11.9*  HCT 36.2* 38.5* 36.2*  PLT 367 292 322    Coag's No results for input(s): APTT, INR in the last 168 hours.  Sepsis Markers No results for  input(s): LATICACIDVEN, PROCALCITON, O2SATVEN in the last 168 hours.  ABG Recent Labs  Lab 05/22/17 0325  PHART 7.339*  PCO2ART 36.6  PO2ART 183*    Liver Enzymes Recent Labs  Lab 05/23/17 0802 05/24/17 0306 05/25/17 0330  AST  --   --  18  ALT  --   --  11*  ALKPHOS  --   --  59  BILITOT  --   --  0.4  ALBUMIN 3.0* 3.0* 2.6*    Cardiac Enzymes No results for input(s): TROPONINI, PROBNP in the last 168 hours.  Glucose Recent Labs  Lab 05/25/17 1155 05/25/17 1243 05/25/17 1557 05/25/17 1910 05/26/17 0004 05/26/17 0439  GLUCAP 68 103* 98 90 114* 97    Imaging No results found.   STUDIES:  CXR 11/10 >> stable ETT, mild cardiomegaly ABX 11/12 >> NG/OG tube in proper position CXR 11/12 >> enteric tube stable, ETT should be advanced 3 cm, mild cardiomegaly CXR 11/13 >> progressive bibasilar atelectasis/infiltrates, small left pleural effusion  CULTURES: None  ANTIBIOTICS: None  SIGNIFICANT EVENTS: 11/09 >> admitted for angioedema requiring ETT for airway pritection 11/13 >> extubated  LINES/TUBES: ETT 11/10-11/13 PIV R-antecubital 11/09-11/09 PIV L-distal upper arm 11/10 >> PIV L-distal arm 11/10 >> Urethral catheter 11/10 >> NG/OG 11/12-11/13  DISCUSSION: Patient is a 66 year old male presenting with hypertension and angioedema likely secondary to lisinopril use requiring intubation for airway protection with acute respiratory failure.  ASSESSMENT / PLAN:  PULMONARY Acute respiratory failure with difficult airway secondary to angioedema s/p extubation 11/13 - ENT following, appreciate recommendations - Continuous pulse ox - Benadryl IV 25 mg Q6H - Solu-Medrol 40 mg IV BID - Oral hygiene while intubated  CARDIOVASCULAR Chronic diastolic heart failure Primary hypertension - Discontinuing lisinopril given likely offending agent for angioedema - Holding home Lopressor given borderline bradycardia - Hydralazine 5-10 mg IV Q6H PRN for SBP  >180 - Cardiac monitoring - Daily weights / strict I&Os  RENAL Hypokalemia: resolved Hypomagnesemia: resolved - Daily BMET, magnesium level - NS 50 mL/hr  GASTROINTESTINAL Nutrition - ADAT - Pepcid IV BID  HEMATOLOGIC Anemia: stable, no signs of active bleeding - SCDs / heparin SQ for DVT prophylaxis  INFECTIOUS  No infectious processes - Daily CBC  ENDOCRINE Hyperglycemia: controlled, likely secondary to steroid use, no history of DM (A1c 6.0) - Accu-Chek Q4H sensitive SSI  NEUROLOGIC   Off sedation since extubation 11/13 - Daily thiamine / folate   FAMILY  - Updates: no family at bedside this morning - Inter-disciplinary family meet or Palliative Care meeting due by: 05/28/2017   Harriet [place], [place]

## 2017-08-30 ENCOUNTER — Ambulatory Visit: Payer: Medicare HMO | Attending: Family Medicine | Admitting: Family Medicine

## 2017-08-30 ENCOUNTER — Other Ambulatory Visit: Payer: Self-pay

## 2017-08-30 ENCOUNTER — Encounter: Payer: Self-pay | Admitting: Family Medicine

## 2017-08-30 VITALS — BP 161/80 | HR 87 | Temp 98.3°F | Resp 16 | Ht 69.0 in | Wt 204.0 lb

## 2017-08-30 DIAGNOSIS — I110 Hypertensive heart disease with heart failure: Secondary | ICD-10-CM | POA: Insufficient documentation

## 2017-08-30 DIAGNOSIS — Z79899 Other long term (current) drug therapy: Secondary | ICD-10-CM | POA: Diagnosis not present

## 2017-08-30 DIAGNOSIS — I509 Heart failure, unspecified: Secondary | ICD-10-CM | POA: Diagnosis not present

## 2017-08-30 DIAGNOSIS — Z760 Encounter for issue of repeat prescription: Secondary | ICD-10-CM | POA: Insufficient documentation

## 2017-08-30 DIAGNOSIS — L309 Dermatitis, unspecified: Secondary | ICD-10-CM | POA: Diagnosis not present

## 2017-08-30 DIAGNOSIS — I10 Essential (primary) hypertension: Secondary | ICD-10-CM

## 2017-08-30 MED ORDER — METOPROLOL TARTRATE 25 MG PO TABS: 25.0000 mg | ORAL_TABLET | Freq: Two times a day (BID) | ORAL | 5 refills | Status: DC

## 2017-08-30 MED ORDER — DESONIDE 0.05 % EX OINT: TOPICAL_OINTMENT | CUTANEOUS | 2 refills | Status: DC

## 2017-08-30 NOTE — Progress Notes (Signed)
Check up

## 2017-08-30 NOTE — Patient Instructions (Signed)
Managing Your Hypertension Hypertension is commonly called high blood pressure. This is when the force of your blood pressing against the walls of your arteries is too strong. Arteries are blood vessels that carry blood from your heart throughout your body. Hypertension forces the heart to work harder to pump blood, and may cause the arteries to become narrow or stiff. Having untreated or uncontrolled hypertension can cause heart attack, stroke, kidney disease, and other problems. What are blood pressure readings? A blood pressure reading consists of a higher number over a lower number. Ideally, your blood pressure should be below 120/80. The first ("top") number is called the systolic pressure. It is a measure of the pressure in your arteries as your heart beats. The second ("bottom") number is called the diastolic pressure. It is a measure of the pressure in your arteries as the heart relaxes. What does my blood pressure reading mean? Blood pressure is classified into four stages. Based on your blood pressure reading, your health care provider may use the following stages to determine what type of treatment you need, if any. Systolic pressure and diastolic pressure are measured in a unit called mm Hg. Normal  Systolic pressure: below 120.  Diastolic pressure: below 80. Elevated  Systolic pressure: 120-129.  Diastolic pressure: below 80. Hypertension stage 1  Systolic pressure: 130-139.  Diastolic pressure: 80-89. Hypertension stage 2  Systolic pressure: 140 or above.  Diastolic pressure: 90 or above. What health risks are associated with hypertension? Managing your hypertension is an important responsibility. Uncontrolled hypertension can lead to:  A heart attack.  A stroke.  A weakened blood vessel (aneurysm).  Heart failure.  Kidney damage.  Eye damage.  Metabolic syndrome.  Memory and concentration problems.  What changes can I make to manage my  hypertension? Hypertension can be managed by making lifestyle changes and possibly by taking medicines. Your health care provider will help you make a plan to bring your blood pressure within a normal range. Eating and drinking  Eat a diet that is high in fiber and potassium, and low in salt (sodium), added sugar, and fat. An example eating plan is called the DASH (Dietary Approaches to Stop Hypertension) diet. To eat this way: ? Eat plenty of fresh fruits and vegetables. Try to fill half of your plate at each meal with fruits and vegetables. ? Eat whole grains, such as whole wheat pasta, brown rice, or whole grain bread. Fill about one quarter of your plate with whole grains. ? Eat low-fat diary products. ? Avoid fatty cuts of meat, processed or cured meats, and poultry with skin. Fill about one quarter of your plate with lean proteins such as fish, chicken without skin, beans, eggs, and tofu. ? Avoid premade and processed foods. These tend to be higher in sodium, added sugar, and fat.  Reduce your daily sodium intake. Most people with hypertension should eat less than 1,500 mg of sodium a day.  Limit alcohol intake to no more than 1 drink a day for nonpregnant women and 2 drinks a day for men. One drink equals 12 oz of beer, 5 oz of wine, or 1 oz of hard liquor. Lifestyle  Work with your health care provider to maintain a healthy body weight, or to lose weight. Ask what an ideal weight is for you.  Get at least 30 minutes of exercise that causes your heart to beat faster (aerobic exercise) most days of the week. Activities may include walking, swimming, or biking.  Include exercise   to strengthen your muscles (resistance exercise), such as weight lifting, as part of your weekly exercise routine. Try to do these types of exercises for 30 minutes at least 3 days a week.  Do not use any products that contain nicotine or tobacco, such as cigarettes and e-cigarettes. If you need help quitting, ask  your health care provider.  Control any long-term (chronic) conditions you have, such as high cholesterol or diabetes. Monitoring  Monitor your blood pressure at home as told by your health care provider. Your personal target blood pressure may vary depending on your medical conditions, your age, and other factors.  Have your blood pressure checked regularly, as often as told by your health care provider. Working with your health care provider  Review all the medicines you take with your health care provider because there may be side effects or interactions.  Talk with your health care provider about your diet, exercise habits, and other lifestyle factors that may be contributing to hypertension.  Visit your health care provider regularly. Your health care provider can help you create and adjust your plan for managing hypertension. Will I need medicine to control my blood pressure? Your health care provider may prescribe medicine if lifestyle changes are not enough to get your blood pressure under control, and if:  Your systolic blood pressure is 130 or higher.  Your diastolic blood pressure is 80 or higher.  Take medicines only as told by your health care provider. Follow the directions carefully. Blood pressure medicines must be taken as prescribed. The medicine does not work as well when you skip doses. Skipping doses also puts you at risk for problems. Contact a health care provider if:  You think you are having a reaction to medicines you have taken.  You have repeated (recurrent) headaches.  You feel dizzy.  You have swelling in your ankles.  You have trouble with your vision. Get help right away if:  You develop a severe headache or confusion.  You have unusual weakness or numbness, or you feel faint.  You have severe pain in your chest or abdomen.  You vomit repeatedly.  You have trouble breathing. Summary  Hypertension is when the force of blood pumping through  your arteries is too strong. If this condition is not controlled, it may put you at risk for serious complications.  Your personal target blood pressure may vary depending on your medical conditions, your age, and other factors. For most people, a normal blood pressure is less than 120/80.  Hypertension is managed by lifestyle changes, medicines, or both. Lifestyle changes include weight loss, eating a healthy, low-sodium diet, exercising more, and limiting alcohol. This information is not intended to replace advice given to you by your health care provider. Make sure you discuss any questions you have with your health care provider. Document Released: 03/23/2012 Document Revised: 05/27/2016 Document Reviewed: 05/27/2016 Elsevier Interactive Patient Education  2018 [employer]  

## 2017-09-01 NOTE — Progress Notes (Signed)
Subjective:  Patient ID: Raymond Stone, male    DOB: 12-03-1950  Age: 67 y.o. MRN: [id number]  CC: Follow-up   HPI Raymond Stone presents for history of HTN, CHF, and severe eczema. History of hypertension:. He is not exercising and is not adherent to low salt diet.  He does not check BP at home. History angioedema requiring hospitalization with intubation in Nov. 2018 r/t lisinopril use.  He was discharged from hospital on metoprolol 25 mg QD.  Cardiac symptoms none. Patient denies chest pain, chest pressure/discomfort, claudication, dyspnea, cough, swelling,  lower extremity edema, near-syncope, palpitations, or syncope.  Cardiovascular risk factors: advanced age (older than 49 for men, 54 for women), hypertension, male gender and sedentary lifestyle. Use of agents associated with hypertension: none. History of target organ damage: heart failure.  History of severe eczema reports receiving treatment from dermatology specialist.    Outpatient Medications Prior to Visit  Medication Sig Dispense Refill  . docusate sodium (COLACE) 100 MG capsule Take 100 mg 2 (two) times daily as needed by mouth for mild constipation.    . Multiple Vitamin (MULTIVITAMIN WITH MINERALS) TABS tablet Take 1 tablet daily by mouth. 30 tablet 6  . triamcinolone ointment (KENALOG) 0.1 % Apply 0.1 application daily after lunch topically. For eczema on all areas except face    . metoprolol tartrate (LOPRESSOR) 25 MG tablet Take 1 tablet (25 mg total) daily by mouth. 30 tablet 5  . amoxicillin (AMOXIL) 500 MG tablet Take 2 tablets (1,000 mg total) 2 (two) times daily by mouth. (Patient not taking: Reported on 08/30/2017) 56 tablet 0  . bisacodyl (DULCOLAX) 5 MG EC tablet Take 1 tablet (5 mg total) by mouth daily as needed for moderate constipation. 30 tablet 0  . clarithromycin (BIAXIN) 500 MG tablet Take 1 tablet (500 mg total) 2 (two) times daily by mouth. (Patient not taking: Reported on 08/30/2017) 28 tablet 0  .  Dupilumab (DUPIXENT [place]) Inject 1 application every 14 (fourteen) days into the skin.    . ferrous sulfate 325 (65 FE) MG tablet Take 1 tablet (325 mg total) by mouth 2 (two) times daily with a meal. (Patient not taking: Reported on 08/30/2017) 180 tablet 3  . fluconazole (DIFLUCAN) 200 MG tablet Take 1 tablet (200 mg total) as directed by mouth. Take 2 tablets on day one, then 1 tablet daily thereafter. (Patient not taking: Reported on 08/30/2017) 15 tablet 0  . folic acid (FOLVITE) 1 MG tablet Take 1 tablet (1 mg total) daily by mouth. (Patient not taking: Reported on 08/30/2017) 30 tablet 6  . hydrOXYzine (ATARAX/VISTARIL) 25 MG tablet Take 25 mg 3 (three) times daily as needed by mouth for itching.    . metroNIDAZOLE (FLAGYL) 500 MG tablet Take 1 tablet (500 mg total) 2 (two) times daily by mouth. (Patient not taking: Reported on 08/30/2017) 28 tablet 0  . omeprazole (PRILOSEC) 40 MG capsule Take 1 capsule (40 mg total) 2 (two) times daily for 14 days by mouth. 28 capsule 0  . predniSONE (DELTASONE) 5 MG tablet Take 4 tablets by mouth with meal once daily starting 05/27/17, then 3 tabs once daily for 2 days, then 2 tabs once daily for 1 day, then 1 tab daily until complete. (Patient not taking: Reported on 08/30/2017) 13 tablet 0  . thiamine 100 MG tablet Take 1 tablet (100 mg total) daily by mouth. (Patient not taking: Reported on 08/30/2017) 30 tablet 6  . traMADol (ULTRAM) 50 MG tablet Take 50 mg  every 6 (six) hours as needed by mouth (pain).    Marland Kitchen desonide (DESOWEN) 0.05 % ointment APPLY A SMALL AMOUNT TOPICALLY TWICE A DAY. FOR 14 DAYS. (Patient taking differently: Apply 1 application daily after lunch topically. For eczema on face) 15 g 0   No facility-administered medications prior to visit.     ROS Review of Systems  Constitutional: Negative.   HENT: Negative for trouble swallowing.   Eyes: Negative.   Respiratory: Negative.   Cardiovascular: Negative.   Gastrointestinal: Negative.   Skin:  Positive for rash (history of severe ezcema).  Neurological: Negative for syncope.  Psychiatric/Behavioral: Negative for suicidal ideas.    Objective:  BP (!) 161/80 (BP Location: Right Arm, Patient Position: Sitting, Cuff Size: Large)   Pulse 87   Temp 98.3 F (36.8 C) (Oral)   Resp 16   Ht 5\' 9"  (1.753 m)   Wt 204 lb (92.5 kg)   SpO2 99%   BMI 30.13 kg/m   BP/Weight 08/30/2017 05/26/2017 69/10/8544  Systolic BP 270 350 093  Diastolic BP 80 90 72  Wt. (Lbs) 204 210.76 207.8  BMI 30.13 30.24 30.69     Physical Exam  Constitutional: He appears well-developed and well-nourished.  Eyes: Conjunctivae are normal. Pupils are equal, round, and reactive to light.  Neck: Normal range of motion. No JVD present.  Cardiovascular: Normal rate, regular rhythm, normal heart sounds and intact distal pulses.  Pulmonary/Chest: Effort normal and breath sounds normal.  Abdominal: Soft. Bowel sounds are normal. There is no tenderness.  Musculoskeletal:  Ambulates with assistive device-straight cane.  Skin: Skin is warm and dry. Rash (ezcema) noted.  Psychiatric: He has a normal mood and affect. He expresses no homicidal and no suicidal ideation. He expresses no suicidal plans and no homicidal plans.  Nursing note and vitals reviewed.  Assessment & Plan:   1. Essential hypertension BP not at goal, dose of metoprolol increased to twice daily. - metoprolol tartrate (LOPRESSOR) 25 MG tablet; Take 1 tablet (25 mg total) by mouth 2 (two) times daily.  Dispense: 60 tablet; Refill: 5  2. Severe eczema Medication refill. - desonide (DESOWEN) 0.05 % ointment; APPLY A SMALL AMOUNT TOPICALLY ONCE DAILY.  Dispense: 60 g; Refill: 2     Follow-up: Return in about 2 weeks (around 09/13/2017) for HTN.   Alfonse Spruce FNP

## 2017-09-14 ENCOUNTER — Other Ambulatory Visit: Payer: Self-pay

## 2017-09-14 ENCOUNTER — Encounter (INDEPENDENT_AMBULATORY_CARE_PROVIDER_SITE_OTHER): Payer: Self-pay | Admitting: Physician Assistant

## 2017-09-14 ENCOUNTER — Ambulatory Visit (INDEPENDENT_AMBULATORY_CARE_PROVIDER_SITE_OTHER): Payer: Medicare HMO | Admitting: Physician Assistant

## 2017-09-14 VITALS — BP 141/83 | HR 90 | Temp 97.8°F | Ht 67.5 in | Wt 204.0 lb

## 2017-09-14 DIAGNOSIS — I10 Essential (primary) hypertension: Secondary | ICD-10-CM

## 2017-09-14 MED ORDER — AMLODIPINE BESYLATE 10 MG PO TABS: 10.0000 mg | ORAL_TABLET | Freq: Every day | ORAL | 1 refills | Status: DC

## 2017-09-14 MED ORDER — METOPROLOL TARTRATE 25 MG PO TABS: 25.0000 mg | ORAL_TABLET | Freq: Two times a day (BID) | ORAL | 1 refills | Status: DC

## 2017-09-14 NOTE — Progress Notes (Signed)
Subjective:  Patient ID: Raymond Stone, male    DOB: [birth year]  Age: 67 y.o. MRN: [id number]  CC: HTN  HPI Raymond Stone is a 67 y.o. male with a medical history of anemia, eczema, HTN presents as a new patient for management of HTN. Currently taking Metoprolol tartrate 25 mg once a day instead of the recommended BID because he thought the prescription was for po qday. Says he feels well. Does not have any complaints or symptoms.       Outpatient Medications Prior to Visit  Medication Sig Dispense Refill  . bisacodyl (DULCOLAX) 5 MG EC tablet Take 1 tablet (5 mg total) by mouth daily as needed for moderate constipation. 30 tablet 0  . folic acid (FOLVITE) 1 MG tablet Take 1 tablet (1 mg total) daily by mouth. 30 tablet 6  . hydrOXYzine (ATARAX/VISTARIL) 25 MG tablet Take 25 mg 3 (three) times daily as needed by mouth for itching.    . metoprolol tartrate (LOPRESSOR) 25 MG tablet Take 1 tablet (25 mg total) by mouth 2 (two) times daily. 60 tablet 5  . Multiple Vitamin (MULTIVITAMIN WITH MINERALS) TABS tablet Take 1 tablet daily by mouth. 30 tablet 6  . thiamine 100 MG tablet Take 1 tablet (100 mg total) daily by mouth. 30 tablet 6  . zinc sulfate 220 (50 Zn) MG capsule Take 220 mg by mouth 2 (two) times daily.    Marland Kitchen amoxicillin (AMOXIL) 500 MG tablet Take 2 tablets (1,000 mg total) 2 (two) times daily by mouth. (Patient not taking: Reported on 08/30/2017) 56 tablet 0  . clarithromycin (BIAXIN) 500 MG tablet Take 1 tablet (500 mg total) 2 (two) times daily by mouth. (Patient not taking: Reported on 08/30/2017) 28 tablet 0  . desonide (DESOWEN) 0.05 % ointment APPLY A SMALL AMOUNT TOPICALLY ONCE DAILY. (Patient not taking: Reported on 09/14/2017) 60 g 2  . docusate sodium (COLACE) 100 MG capsule Take 100 mg 2 (two) times daily as needed by mouth for mild constipation.    . Dupilumab (DUPIXENT ) Inject 1 application every 14 (fourteen) days into the skin.    . ferrous sulfate 325 (65 FE) MG  tablet Take 1 tablet (325 mg total) by mouth 2 (two) times daily with a meal. (Patient not taking: Reported on 08/30/2017) 180 tablet 3  . fluconazole (DIFLUCAN) 200 MG tablet Take 1 tablet (200 mg total) as directed by mouth. Take 2 tablets on day one, then 1 tablet daily thereafter. (Patient not taking: Reported on 08/30/2017) 15 tablet 0  . metroNIDAZOLE (FLAGYL) 500 MG tablet Take 1 tablet (500 mg total) 2 (two) times daily by mouth. (Patient not taking: Reported on 08/30/2017) 28 tablet 0  . omeprazole (PRILOSEC) 40 MG capsule Take 1 capsule (40 mg total) 2 (two) times daily for 14 days by mouth. 28 capsule 0  . predniSONE (DELTASONE) 5 MG tablet Take 4 tablets by mouth with meal once daily starting 05/27/17, then 3 tabs once daily for 2 days, then 2 tabs once daily for 1 day, then 1 tab daily until complete. (Patient not taking: Reported on 08/30/2017) 13 tablet 0  . traMADol (ULTRAM) 50 MG tablet Take 50 mg every 6 (six) hours as needed by mouth (pain).    . triamcinolone ointment (KENALOG) 0.1 % Apply 0.1 application daily after lunch topically. For eczema on all areas except face     No facility-administered medications prior to visit.      ROS Review of Systems  Constitutional:  Negative for chills, fever and malaise/fatigue.  Eyes: Negative for blurred vision.  Respiratory: Negative for shortness of breath.   Cardiovascular: Negative for chest pain and palpitations.  Gastrointestinal: Negative for abdominal pain and nausea.  Genitourinary: Negative for dysuria and hematuria.  Musculoskeletal: Negative for joint pain and myalgias.  Skin: Negative for rash.  Neurological: Negative for tingling and headaches.  Psychiatric/Behavioral: Negative for depression. The patient is not nervous/anxious.     Objective:  BP (!) 141/83 (BP Location: Left Arm, Patient Position: Sitting, Cuff Size: Normal)   Pulse 90   Temp 97.8 F (36.6 C) (Oral)   Ht 5' 7.5" (1.715 m)   Wt 204 lb (92.5 kg)    SpO2 98%   BMI 31.48 kg/m   BP/Weight 09/14/2017 08/30/2017 75/17/0017  Systolic BP 494 496 759  Diastolic BP 83 80 90  Wt. (Lbs) 204 204 210.76  BMI 31.48 30.13 30.24      Physical Exam  Constitutional: He is oriented to person, place, and time.  Well developed, well nourished, NAD, polite  HENT:  Head: Normocephalic and atraumatic.  Eyes: Conjunctivae are normal. No scleral icterus.  Neck: Normal range of motion. Neck supple. No thyromegaly present.  Cardiovascular: Normal rate, regular rhythm and normal heart sounds. Exam reveals no gallop.  No murmur heard. Pulmonary/Chest: Effort normal and breath sounds normal. No respiratory distress. He has no wheezes.  Abdominal: Soft. Bowel sounds are normal. There is no tenderness.  Musculoskeletal: He exhibits no edema.  Neurological: He is alert and oriented to person, place, and time. No cranial nerve deficit.  Skin: Skin is warm and dry. No rash noted. No erythema. No pallor.  Ichthyotic skin  Psychiatric: He has a normal mood and affect. His behavior is normal. Thought content normal.  Vitals reviewed.    Assessment & Plan:    1. Essential hypertension - Begin amLODipine (NORVASC) 10 MG tablet; Take 1 tablet (10 mg total) by mouth daily.  Dispense: 90 tablet; Refill: 1 - Refill metoprolol tartrate (LOPRESSOR) 25 MG tablet; Take 1 tablet (25 mg total) by mouth 2 (two) times daily.  Dispense: 180 tablet; Refill: 1   Meds ordered this encounter  Medications  . amLODipine (NORVASC) 10 MG tablet    Sig: Take 1 tablet (10 mg total) by mouth daily.    Dispense:  90 tablet    Refill:  1    Order Specific Question:   Supervising Provider    Answer:   Tresa Garter [id number]  . metoprolol tartrate (LOPRESSOR) 25 MG tablet    Sig: Take 1 tablet (25 mg total) by mouth 2 (two) times daily.    Dispense:  180 tablet    Refill:  1    Order Specific Question:   Supervising Provider    Answer:   Tresa Garter [id number]     Follow-up: Return in about 4 weeks (around 10/12/2017) for HTN.   [name] PA

## 2017-09-14 NOTE — Patient Instructions (Signed)
Managing Your Hypertension Hypertension is commonly called high blood pressure. This is when the force of your blood pressing against the walls of your arteries is too strong. Arteries are blood vessels that carry blood from your heart throughout your body. Hypertension forces the heart to work harder to pump blood, and may cause the arteries to become narrow or stiff. Having untreated or uncontrolled hypertension can cause heart attack, stroke, kidney disease, and other problems. What are blood pressure readings? A blood pressure reading consists of a higher number over a lower number. Ideally, your blood pressure should be below 120/80. The first ("top") number is called the systolic pressure. It is a measure of the pressure in your arteries as your heart beats. The second ("bottom") number is called the diastolic pressure. It is a measure of the pressure in your arteries as the heart relaxes. What does my blood pressure reading mean? Blood pressure is classified into four stages. Based on your blood pressure reading, your health care provider may use the following stages to determine what type of treatment you need, if any. Systolic pressure and diastolic pressure are measured in a unit called mm Hg. Normal  Systolic pressure: below 120.  Diastolic pressure: below 80. Elevated  Systolic pressure: 120-129.  Diastolic pressure: below 80. Hypertension stage 1  Systolic pressure: 130-139.  Diastolic pressure: 80-89. Hypertension stage 2  Systolic pressure: 140 or above.  Diastolic pressure: 90 or above. What health risks are associated with hypertension? Managing your hypertension is an important responsibility. Uncontrolled hypertension can lead to:  A heart attack.  A stroke.  A weakened blood vessel (aneurysm).  Heart failure.  Kidney damage.  Eye damage.  Metabolic syndrome.  Memory and concentration problems.  What changes can I make to manage my  hypertension? Hypertension can be managed by making lifestyle changes and possibly by taking medicines. Your health care provider will help you make a plan to bring your blood pressure within a normal range. Eating and drinking  Eat a diet that is high in fiber and potassium, and low in salt (sodium), added sugar, and fat. An example eating plan is called the DASH (Dietary Approaches to Stop Hypertension) diet. To eat this way: ? Eat plenty of fresh fruits and vegetables. Try to fill half of your plate at each meal with fruits and vegetables. ? Eat whole grains, such as whole wheat pasta, brown rice, or whole grain bread. Fill about one quarter of your plate with whole grains. ? Eat low-fat diary products. ? Avoid fatty cuts of meat, processed or cured meats, and poultry with skin. Fill about one quarter of your plate with lean proteins such as fish, chicken without skin, beans, eggs, and tofu. ? Avoid premade and processed foods. These tend to be higher in sodium, added sugar, and fat.  Reduce your daily sodium intake. Most people with hypertension should eat less than 1,500 mg of sodium a day.  Limit alcohol intake to no more than 1 drink a day for nonpregnant women and 2 drinks a day for men. One drink equals 12 oz of beer, 5 oz of wine, or 1 oz of hard liquor. Lifestyle  Work with your health care provider to maintain a healthy body weight, or to lose weight. Ask what an ideal weight is for you.  Get at least 30 minutes of exercise that causes your heart to beat faster (aerobic exercise) most days of the week. Activities may include walking, swimming, or biking.  Include exercise   to strengthen your muscles (resistance exercise), such as weight lifting, as part of your weekly exercise routine. Try to do these types of exercises for 30 minutes at least 3 days a week.  Do not use any products that contain nicotine or tobacco, such as cigarettes and e-cigarettes. If you need help quitting, ask  your health care provider.  Control any long-term (chronic) conditions you have, such as high cholesterol or diabetes. Monitoring  Monitor your blood pressure at home as told by your health care provider. Your personal target blood pressure may vary depending on your medical conditions, your age, and other factors.  Have your blood pressure checked regularly, as often as told by your health care provider. Working with your health care provider  Review all the medicines you take with your health care provider because there may be side effects or interactions.  Talk with your health care provider about your diet, exercise habits, and other lifestyle factors that may be contributing to hypertension.  Visit your health care provider regularly. Your health care provider can help you create and adjust your plan for managing hypertension. Will I need medicine to control my blood pressure? Your health care provider may prescribe medicine if lifestyle changes are not enough to get your blood pressure under control, and if:  Your systolic blood pressure is 130 or higher.  Your diastolic blood pressure is 80 or higher.  Take medicines only as told by your health care provider. Follow the directions carefully. Blood pressure medicines must be taken as prescribed. The medicine does not work as well when you skip doses. Skipping doses also puts you at risk for problems. Contact a health care provider if:  You think you are having a reaction to medicines you have taken.  You have repeated (recurrent) headaches.  You feel dizzy.  You have swelling in your ankles.  You have trouble with your vision. Get help right away if:  You develop a severe headache or confusion.  You have unusual weakness or numbness, or you feel faint.  You have severe pain in your chest or abdomen.  You vomit repeatedly.  You have trouble breathing. Summary  Hypertension is when the force of blood pumping through  your arteries is too strong. If this condition is not controlled, it may put you at risk for serious complications.  Your personal target blood pressure may vary depending on your medical conditions, your age, and other factors. For most people, a normal blood pressure is less than 120/80.  Hypertension is managed by lifestyle changes, medicines, or both. Lifestyle changes include weight loss, eating a healthy, low-sodium diet, exercising more, and limiting alcohol. This information is not intended to replace advice given to you by your health care provider. Make sure you discuss any questions you have with your health care provider. Document Released: 03/23/2012 Document Revised: 05/27/2016 Document Reviewed: 05/27/2016 Elsevier Interactive Patient Education  2018 [employer]  

## 2017-09-15 ENCOUNTER — Telehealth (INDEPENDENT_AMBULATORY_CARE_PROVIDER_SITE_OTHER): Payer: Self-pay

## 2017-09-15 LAB — COMPREHENSIVE METABOLIC PANEL
ALBUMIN: 3.9 g/dL (ref 3.6–4.8)
ALT: 13 IU/L (ref 0–44)
AST: 17 IU/L (ref 0–40)
Albumin/Globulin Ratio: 1.1 — ABNORMAL LOW (ref 1.2–2.2)
Alkaline Phosphatase: 87 IU/L (ref 39–117)
BUN / CREAT RATIO: 14 (ref 10–24)
BUN: 18 mg/dL (ref 8–27)
Bilirubin Total: 0.5 mg/dL (ref 0.0–1.2)
CO2: 19 mmol/L — AB (ref 20–29)
Calcium: 9.3 mg/dL (ref 8.6–10.2)
Chloride: 103 mmol/L (ref 96–106)
Creatinine, Ser: 1.26 mg/dL (ref 0.76–1.27)
GFR calc Af Amer: 68 mL/min/{1.73_m2} (ref >59)
GFR calc non Af Amer: 59 mL/min/{1.73_m2} — ABNORMAL LOW (ref >59)
GLOBULIN, TOTAL: 3.5 g/dL (ref 1.5–4.5)
GLUCOSE: 94 mg/dL (ref 65–99)
Potassium: 3.9 mmol/L (ref 3.5–5.2)
Sodium: 140 mmol/L (ref 134–144)
TOTAL PROTEIN: 7.4 g/dL (ref 6.0–8.5)

## 2017-09-15 LAB — LIPID PANEL
CHOL/HDL RATIO: 1.9 ratio (ref 0.0–5.0)
Cholesterol, Total: 190 mg/dL (ref 100–199)
HDL: 99 mg/dL (ref >39)
LDL CALC: 78 mg/dL (ref 0–99)
Triglycerides: 65 mg/dL (ref 0–149)
VLDL CHOLESTEROL CAL: 13 mg/dL (ref 5–40)

## 2017-09-15 NOTE — Telephone Encounter (Signed)
-----   Message from [name], PA-C sent at 09/15/2017  1:39 PM EST ----- Chloride improved. Labs unremarkable.

## 2017-09-15 NOTE — Telephone Encounter (Signed)
Called both home and mobile numbers, both rang several times and then disconnected. Will call once more before mailing results. Nat Christen, CMA

## 2017-09-16 ENCOUNTER — Telehealth (INDEPENDENT_AMBULATORY_CARE_PROVIDER_SITE_OTHER): Payer: Self-pay

## 2017-09-16 NOTE — Telephone Encounter (Signed)
Patient is aware of improved chloride and other normal labs. Nat Christen, CMA

## 2017-09-16 NOTE — Telephone Encounter (Signed)
-----   Message from [name], PA-C sent at 09/15/2017  1:39 PM EST ----- Chloride improved. Labs unremarkable.

## 2017-10-12 ENCOUNTER — Ambulatory Visit (INDEPENDENT_AMBULATORY_CARE_PROVIDER_SITE_OTHER): Payer: Medicare HMO | Admitting: Physician Assistant

## 2017-10-12 ENCOUNTER — Other Ambulatory Visit: Payer: Self-pay

## 2017-10-12 ENCOUNTER — Encounter (INDEPENDENT_AMBULATORY_CARE_PROVIDER_SITE_OTHER): Payer: Self-pay | Admitting: Physician Assistant

## 2017-10-12 VITALS — BP 117/74 | HR 77 | Temp 97.9°F | Ht 69.0 in | Wt 201.4 lb

## 2017-10-12 DIAGNOSIS — Z760 Encounter for issue of repeat prescription: Secondary | ICD-10-CM | POA: Diagnosis not present

## 2017-10-12 DIAGNOSIS — I10 Essential (primary) hypertension: Secondary | ICD-10-CM

## 2017-10-12 MED ORDER — AMLODIPINE BESYLATE 10 MG PO TABS: 10.0000 mg | ORAL_TABLET | Freq: Every day | ORAL | 3 refills | Status: DC

## 2017-10-12 MED ORDER — METOPROLOL TARTRATE 25 MG PO TABS: 25.0000 mg | ORAL_TABLET | Freq: Two times a day (BID) | ORAL | 3 refills | Status: DC

## 2017-10-12 MED ORDER — FOLIC ACID 1 MG PO TABS: 1.0000 mg | ORAL_TABLET | Freq: Every day | ORAL | 6 refills | Status: DC

## 2017-10-12 NOTE — Patient Instructions (Signed)
Managing Your Hypertension Hypertension is commonly called high blood pressure. This is when the force of your blood pressing against the walls of your arteries is too strong. Arteries are blood vessels that carry blood from your heart throughout your body. Hypertension forces the heart to work harder to pump blood, and may cause the arteries to become narrow or stiff. Having untreated or uncontrolled hypertension can cause heart attack, stroke, kidney disease, and other problems. What are blood pressure readings? A blood pressure reading consists of a higher number over a lower number. Ideally, your blood pressure should be below 120/80. The first ("top") number is called the systolic pressure. It is a measure of the pressure in your arteries as your heart beats. The second ("bottom") number is called the diastolic pressure. It is a measure of the pressure in your arteries as the heart relaxes. What does my blood pressure reading mean? Blood pressure is classified into four stages. Based on your blood pressure reading, your health care provider may use the following stages to determine what type of treatment you need, if any. Systolic pressure and diastolic pressure are measured in a unit called mm Hg. Normal  Systolic pressure: below 120.  Diastolic pressure: below 80. Elevated  Systolic pressure: 120-129.  Diastolic pressure: below 80. Hypertension stage 1  Systolic pressure: 130-139.  Diastolic pressure: 80-89. Hypertension stage 2  Systolic pressure: 140 or above.  Diastolic pressure: 90 or above. What health risks are associated with hypertension? Managing your hypertension is an important responsibility. Uncontrolled hypertension can lead to:  A heart attack.  A stroke.  A weakened blood vessel (aneurysm).  Heart failure.  Kidney damage.  Eye damage.  Metabolic syndrome.  Memory and concentration problems.  What changes can I make to manage my  hypertension? Hypertension can be managed by making lifestyle changes and possibly by taking medicines. Your health care provider will help you make a plan to bring your blood pressure within a normal range. Eating and drinking  Eat a diet that is high in fiber and potassium, and low in salt (sodium), added sugar, and fat. An example eating plan is called the DASH (Dietary Approaches to Stop Hypertension) diet. To eat this way: ? Eat plenty of fresh fruits and vegetables. Try to fill half of your plate at each meal with fruits and vegetables. ? Eat whole grains, such as whole wheat pasta, brown rice, or whole grain bread. Fill about one quarter of your plate with whole grains. ? Eat low-fat diary products. ? Avoid fatty cuts of meat, processed or cured meats, and poultry with skin. Fill about one quarter of your plate with lean proteins such as fish, chicken without skin, beans, eggs, and tofu. ? Avoid premade and processed foods. These tend to be higher in sodium, added sugar, and fat.  Reduce your daily sodium intake. Most people with hypertension should eat less than 1,500 mg of sodium a day.  Limit alcohol intake to no more than 1 drink a day for nonpregnant women and 2 drinks a day for men. One drink equals 12 oz of beer, 5 oz of wine, or 1 oz of hard liquor. Lifestyle  Work with your health care provider to maintain a healthy body weight, or to lose weight. Ask what an ideal weight is for you.  Get at least 30 minutes of exercise that causes your heart to beat faster (aerobic exercise) most days of the week. Activities may include walking, swimming, or biking.  Include exercise   to strengthen your muscles (resistance exercise), such as weight lifting, as part of your weekly exercise routine. Try to do these types of exercises for 30 minutes at least 3 days a week.  Do not use any products that contain nicotine or tobacco, such as cigarettes and e-cigarettes. If you need help quitting, ask  your health care provider.  Control any long-term (chronic) conditions you have, such as high cholesterol or diabetes. Monitoring  Monitor your blood pressure at home as told by your health care provider. Your personal target blood pressure may vary depending on your medical conditions, your age, and other factors.  Have your blood pressure checked regularly, as often as told by your health care provider. Working with your health care provider  Review all the medicines you take with your health care provider because there may be side effects or interactions.  Talk with your health care provider about your diet, exercise habits, and other lifestyle factors that may be contributing to hypertension.  Visit your health care provider regularly. Your health care provider can help you create and adjust your plan for managing hypertension. Will I need medicine to control my blood pressure? Your health care provider may prescribe medicine if lifestyle changes are not enough to get your blood pressure under control, and if:  Your systolic blood pressure is 130 or higher.  Your diastolic blood pressure is 80 or higher.  Take medicines only as told by your health care provider. Follow the directions carefully. Blood pressure medicines must be taken as prescribed. The medicine does not work as well when you skip doses. Skipping doses also puts you at risk for problems. Contact a health care provider if:  You think you are having a reaction to medicines you have taken.  You have repeated (recurrent) headaches.  You feel dizzy.  You have swelling in your ankles.  You have trouble with your vision. Get help right away if:  You develop a severe headache or confusion.  You have unusual weakness or numbness, or you feel faint.  You have severe pain in your chest or abdomen.  You vomit repeatedly.  You have trouble breathing. Summary  Hypertension is when the force of blood pumping through  your arteries is too strong. If this condition is not controlled, it may put you at risk for serious complications.  Your personal target blood pressure may vary depending on your medical conditions, your age, and other factors. For most people, a normal blood pressure is less than 120/80.  Hypertension is managed by lifestyle changes, medicines, or both. Lifestyle changes include weight loss, eating a healthy, low-sodium diet, exercising more, and limiting alcohol. This information is not intended to replace advice given to you by your health care provider. Make sure you discuss any questions you have with your health care provider. Document Released: 03/23/2012 Document Revised: 05/27/2016 Document Reviewed: 05/27/2016 Elsevier Interactive Patient Education  2018 [employer]  

## 2017-10-12 NOTE — Progress Notes (Signed)
Subjective:  Patient ID: Raymond Stone, male    DOB: [birth year]  Age: 67 y.o. MRN: [id number]  CC: f/u HTN   HPI  Raymond Stone is a 67 y.o. male with a medical history of anemia, eczema, HTN presents to f/u on HTN. Last BP 141/83 mmHg one month ago. BP 117/74 mmHg today. Had amlodipine added to his anti-hypertensive regimen which consisted only of Metoprolol 25 mg. Says he is taking medications as prescribed. Feels better and is walking for exercise. Reports controlling salt intake. Lipid panel completely normal nearly one month ago. Does not endorse any symptoms or complaints.    Outpatient Medications Prior to Visit  Medication Sig Dispense Refill  . amLODipine (NORVASC) 10 MG tablet Take 1 tablet (10 mg total) by mouth daily. 90 tablet 1  . bisacodyl (DULCOLAX) 5 MG EC tablet Take 1 tablet (5 mg total) by mouth daily as needed for moderate constipation. 30 tablet 0  . metoprolol tartrate (LOPRESSOR) 25 MG tablet Take 1 tablet (25 mg total) by mouth 2 (two) times daily. 180 tablet 1  . Multiple Vitamin (MULTIVITAMIN WITH MINERALS) TABS tablet Take 1 tablet daily by mouth. 30 tablet 6  . triamcinolone ointment (KENALOG) 0.1 % Apply 0.1 application daily after lunch topically. For eczema on all areas except face    . docusate sodium (COLACE) 100 MG capsule Take 100 mg 2 (two) times daily as needed by mouth for mild constipation.    . Dupilumab (DUPIXENT [place]) Inject 1 application every 14 (fourteen) days into the skin.    . folic acid (FOLVITE) 1 MG tablet Take 1 tablet (1 mg total) daily by mouth. (Patient not taking: Reported on 10/12/2017) 30 tablet 6  . hydrOXYzine (ATARAX/VISTARIL) 25 MG tablet Take 25 mg 3 (three) times daily as needed by mouth for itching.    . thiamine 100 MG tablet Take 1 tablet (100 mg total) daily by mouth. 30 tablet 6  . omeprazole (PRILOSEC) 40 MG capsule Take 1 capsule (40 mg total) 2 (two) times daily for 14 days by mouth. 28 capsule 0  . traMADol (ULTRAM) 50  MG tablet Take 50 mg every 6 (six) hours as needed by mouth (pain).    . zinc sulfate 220 (50 Zn) MG capsule Take 220 mg by mouth 2 (two) times daily.     No facility-administered medications prior to visit.      ROS Review of Systems  Constitutional: Negative for chills, fever and malaise/fatigue.  Eyes: Negative for blurred vision.  Respiratory: Negative for shortness of breath.   Cardiovascular: Negative for chest pain and palpitations.  Gastrointestinal: Negative for abdominal pain and nausea.  Genitourinary: Negative for dysuria and hematuria.  Musculoskeletal: Negative for joint pain and myalgias.  Skin: Negative for rash.       Eczema but under control  Neurological: Negative for tingling and headaches.  Psychiatric/Behavioral: Negative for depression. The patient is not nervous/anxious.     Objective:  BP 135/81 (BP Location: Left Arm, Patient Position: Sitting, Cuff Size: Large)   Pulse 77   Temp 97.9 F (36.6 C) (Oral)   Ht 5\' 9"  (1.753 m)   Wt 201 lb 6.4 oz (91.4 kg)   SpO2 97%   BMI 29.74 kg/m   Blood pressure 117/74, pulse 77, temperature 97.9 F (36.6 C), temperature source Oral, height 5\' 9"  (1.753 m), weight 201 lb 6.4 oz (91.4 kg), SpO2 97 %.  Physical Exam  Constitutional: He is oriented to person, place, and time.  Well developed, well nourished, NAD, polite  HENT:  Head: Normocephalic and atraumatic.  Eyes: No scleral icterus.  Neck: Normal range of motion. Neck supple. No thyromegaly present.  Cardiovascular: Normal rate, regular rhythm and normal heart sounds.  No carotid bruit bilaterally  Pulmonary/Chest: Effort normal and breath sounds normal. No respiratory distress. He has no wheezes.  Abdominal: Soft. Bowel sounds are normal. There is no tenderness.  Musculoskeletal: He exhibits no edema.  Neurological: He is alert and oriented to person, place, and time. No cranial nerve deficit. Coordination normal.  Skin: Skin is warm and dry. No rash  noted. No erythema. No pallor.  Psychiatric: He has a normal mood and affect. His behavior is normal. Thought content normal.  Vitals reviewed.    Assessment & Plan:    1. Essential hypertension - metoprolol tartrate (LOPRESSOR) 25 MG tablet; Take 1 tablet (25 mg total) by mouth 2 (two) times daily.  Dispense: 180 tablet; Refill: 3 - amLODipine (NORVASC) 10 MG tablet; Take 1 tablet (10 mg total) by mouth daily.  Dispense: 90 tablet; Refill: 3  2. Medication refill - folic acid (FOLVITE) 1 MG tablet; Take 1 tablet (1 mg total) by mouth daily.  Dispense: 30 tablet; Refill: 6   Meds ordered this encounter  Medications  . metoprolol tartrate (LOPRESSOR) 25 MG tablet    Sig: Take 1 tablet (25 mg total) by mouth 2 (two) times daily.    Dispense:  180 tablet    Refill:  3    Order Specific Question:   Supervising Provider    Answer:   Tresa Garter [id number]  . amLODipine (NORVASC) 10 MG tablet    Sig: Take 1 tablet (10 mg total) by mouth daily.    Dispense:  90 tablet    Refill:  3    Order Specific Question:   Supervising Provider    Answer:   Tresa Garter [id number]  . folic acid (FOLVITE) 1 MG tablet    Sig: Take 1 tablet (1 mg total) by mouth daily.    Dispense:  30 tablet    Refill:  6    Order Specific Question:   Supervising Provider    Answer:   Tresa Garter [id number]    Follow-up: Return in about 6 months (around 04/13/2018) for HTN.   [name] PA

## 2017-11-15 ENCOUNTER — Other Ambulatory Visit (INDEPENDENT_AMBULATORY_CARE_PROVIDER_SITE_OTHER): Payer: Self-pay | Admitting: Physician Assistant

## 2017-11-15 ENCOUNTER — Telehealth (INDEPENDENT_AMBULATORY_CARE_PROVIDER_SITE_OTHER): Payer: Self-pay | Admitting: Physician Assistant

## 2017-11-15 MED ORDER — TRIAMCINOLONE ACETONIDE 0.1 % EX OINT: 0.1000 "application " | TOPICAL_OINTMENT | Freq: Every day | CUTANEOUS | 0 refills | Status: DC

## 2017-11-15 MED ORDER — TRIAMCINOLONE ACETONIDE 0.1 % EX OINT: 1.0000 "application " | TOPICAL_OINTMENT | Freq: Every day | CUTANEOUS | 0 refills | Status: DC

## 2017-11-15 NOTE — Telephone Encounter (Signed)
Please order a new prescription if you deem the request appropriate.

## 2017-11-15 NOTE — Telephone Encounter (Signed)
Patient called requesting a refill of  triamcinolone ointment (KENALOG) 0.1 % . [place] . I told him that PA Altamease Oiler didn't  Prescribe the cream and he insist in getting the refill .

## 2017-12-16 DIAGNOSIS — L2084 Intrinsic (allergic) eczema: Secondary | ICD-10-CM | POA: Diagnosis not present

## 2018-02-03 DIAGNOSIS — Z809 Family history of malignant neoplasm, unspecified: Secondary | ICD-10-CM | POA: Diagnosis not present

## 2018-02-03 DIAGNOSIS — Z791 Long term (current) use of non-steroidal anti-inflammatories (NSAID): Secondary | ICD-10-CM | POA: Diagnosis not present

## 2018-02-03 DIAGNOSIS — Z87892 Personal history of anaphylaxis: Secondary | ICD-10-CM | POA: Diagnosis not present

## 2018-02-03 DIAGNOSIS — Z6830 Body mass index (BMI) 30.0-30.9, adult: Secondary | ICD-10-CM | POA: Diagnosis not present

## 2018-02-03 DIAGNOSIS — K5900 Constipation, unspecified: Secondary | ICD-10-CM | POA: Diagnosis not present

## 2018-02-03 DIAGNOSIS — I10 Essential (primary) hypertension: Secondary | ICD-10-CM | POA: Diagnosis not present

## 2018-02-03 DIAGNOSIS — E669 Obesity, unspecified: Secondary | ICD-10-CM | POA: Diagnosis not present

## 2018-02-03 DIAGNOSIS — Z833 Family history of diabetes mellitus: Secondary | ICD-10-CM | POA: Diagnosis not present

## 2018-02-03 DIAGNOSIS — Z8249 Family history of ischemic heart disease and other diseases of the circulatory system: Secondary | ICD-10-CM | POA: Diagnosis not present

## 2018-02-03 DIAGNOSIS — Z888 Allergy status to other drugs, medicaments and biological substances status: Secondary | ICD-10-CM | POA: Diagnosis not present

## 2018-03-31 DIAGNOSIS — L2084 Intrinsic (allergic) eczema: Secondary | ICD-10-CM | POA: Diagnosis not present

## 2018-04-13 ENCOUNTER — Ambulatory Visit (INDEPENDENT_AMBULATORY_CARE_PROVIDER_SITE_OTHER): Payer: Medicare HMO | Admitting: Physician Assistant

## 2018-04-13 ENCOUNTER — Encounter (INDEPENDENT_AMBULATORY_CARE_PROVIDER_SITE_OTHER): Payer: Self-pay | Admitting: Physician Assistant

## 2018-04-13 ENCOUNTER — Other Ambulatory Visit: Payer: Self-pay

## 2018-04-13 VITALS — BP 136/81 | HR 74 | Temp 97.8°F | Ht 69.0 in | Wt 201.8 lb

## 2018-04-13 DIAGNOSIS — I10 Essential (primary) hypertension: Secondary | ICD-10-CM

## 2018-04-13 DIAGNOSIS — Z23 Encounter for immunization: Secondary | ICD-10-CM | POA: Diagnosis not present

## 2018-04-13 MED ORDER — AMLODIPINE BESYLATE 10 MG PO TABS: 10.0000 mg | ORAL_TABLET | Freq: Every day | ORAL | 1 refills | Status: DC

## 2018-04-13 MED ORDER — METOPROLOL TARTRATE 25 MG PO TABS: 25.0000 mg | ORAL_TABLET | Freq: Two times a day (BID) | ORAL | 1 refills | Status: DC

## 2018-04-13 NOTE — Progress Notes (Signed)
Subjective:  Patient ID: Raymond Stone, male    DOB: 1951-05-18  Age: 67 y.o. MRN: [id number]  CC: f/u HTN  HPI Sayer Masini is a 67 y.o. male with a medical history of anemia, eczema, HTN presents to f/u on HTN. Last BP 117/74 mmHg six months ago. BP 136/81 mmHg today. Not taking Amlodipine 10 mg. Does not endorse CP, palpitations, SOB, HA, tingling, numbness, weakness, presyncope, syncope, abdominal pain, f/c/n/v, swelling, rash, or GI/GU sxs.      Outpatient Medications Prior to Visit  Medication Sig Dispense Refill  . metoprolol tartrate (LOPRESSOR) 25 MG tablet Take 1 tablet (25 mg total) by mouth 2 (two) times daily. 180 tablet 3  . triamcinolone ointment (KENALOG) 0.1 % Apply 1 application topically daily after lunch. For eczema on all areas except face 30 g 0  . amLODipine (NORVASC) 10 MG tablet Take 1 tablet (10 mg total) by mouth daily. (Patient not taking: Reported on 04/13/2018) 90 tablet 3  . docusate sodium (COLACE) 100 MG capsule Take 100 mg 2 (two) times daily as needed by mouth for mild constipation.    . folic acid (FOLVITE) 1 MG tablet Take 1 tablet (1 mg total) by mouth daily. (Patient not taking: Reported on 04/13/2018) 30 tablet 6  . Multiple Vitamin (MULTIVITAMIN WITH MINERALS) TABS tablet Take 1 tablet daily by mouth. (Patient not taking: Reported on 04/13/2018) 30 tablet 6  . thiamine 100 MG tablet Take 1 tablet (100 mg total) daily by mouth. (Patient not taking: Reported on 04/13/2018) 30 tablet 6  . bisacodyl (DULCOLAX) 5 MG EC tablet Take 1 tablet (5 mg total) by mouth daily as needed for moderate constipation. 30 tablet 0  . Dupilumab (DUPIXENT [place]) Inject 1 application every 14 (fourteen) days into the skin.    . hydrOXYzine (ATARAX/VISTARIL) 25 MG tablet Take 25 mg 3 (three) times daily as needed by mouth for itching.     No facility-administered medications prior to visit.      ROS Review of Systems  Constitutional: Negative for chills, fever and  malaise/fatigue.  Eyes: Negative for blurred vision.  Respiratory: Negative for shortness of breath.   Cardiovascular: Negative for chest pain and palpitations.  Gastrointestinal: Negative for abdominal pain and nausea.  Genitourinary: Negative for dysuria and hematuria.  Musculoskeletal: Negative for joint pain and myalgias.  Skin: Negative for rash.  Neurological: Negative for tingling and headaches.  Psychiatric/Behavioral: Negative for depression. The patient is not nervous/anxious.     Objective:  Ht 5\' 9"  (1.753 m)   Wt 201 lb 12.8 oz (91.5 kg)   BMI 29.80 kg/m   Vitals:   04/13/18 0832  BP: 136/81  Pulse: 74  Temp: 97.8 F (36.6 C)  TempSrc: Oral  SpO2: 96%  Weight: 201 lb 12.8 oz (91.5 kg)  Height: 5\' 9"  (1.753 m)      Physical Exam  Constitutional: He is oriented to person, place, and time.  Well developed, well nourished, NAD, polite  HENT:  Head: Normocephalic and atraumatic.  Eyes: No scleral icterus.  Neck: Normal range of motion. Neck supple. No thyromegaly present.  Cardiovascular: Normal rate, regular rhythm and normal heart sounds.  Pulmonary/Chest: Effort normal and breath sounds normal.  Musculoskeletal: He exhibits no edema.  Neurological: He is alert and oriented to person, place, and time.  Skin: Skin is warm and dry. No rash noted. No erythema. No pallor.  Psychiatric: He has a normal mood and affect. His behavior is normal. Thought content normal.  Vitals reviewed.    Assessment & Plan:    1. Essential hypertension - amLODipine (NORVASC) 10 MG tablet; Take 1 tablet (10 mg total) by mouth daily.  Dispense: 90 tablet; Refill: 1 - metoprolol tartrate (LOPRESSOR) 25 MG tablet; Take 1 tablet (25 mg total) by mouth 2 (two) times daily.  Dispense: 180 tablet; Refill: 1 - Comprehensive metabolic panel - Lipid panel  2. Need for Tdap vaccination - Tdap vaccine greater than or equal to 7yo IM   Meds ordered this encounter  Medications  .  amLODipine (NORVASC) 10 MG tablet    Sig: Take 1 tablet (10 mg total) by mouth daily.    Dispense:  90 tablet    Refill:  1    Order Specific Question:   Supervising Provider    Answer:   Charlott Rakes [[id number]]  . metoprolol tartrate (LOPRESSOR) 25 MG tablet    Sig: Take 1 tablet (25 mg total) by mouth 2 (two) times daily.    Dispense:  180 tablet    Refill:  1    Order Specific Question:   Supervising Provider    Answer:   Charlott Rakes [[id number]]    Follow-up: Return in about 6 months (around 10/13/2018).   [name] PA

## 2018-04-13 NOTE — Patient Instructions (Signed)

## 2018-04-14 ENCOUNTER — Telehealth (INDEPENDENT_AMBULATORY_CARE_PROVIDER_SITE_OTHER): Payer: Self-pay

## 2018-04-14 LAB — COMPREHENSIVE METABOLIC PANEL
A/G RATIO: 1.3 (ref 1.2–2.2)
ALT: 12 IU/L (ref 0–44)
AST: 18 IU/L (ref 0–40)
Albumin: 4.3 g/dL (ref 3.6–4.8)
Alkaline Phosphatase: 106 IU/L (ref 39–117)
BUN / CREAT RATIO: 18 (ref 10–24)
BUN: 23 mg/dL (ref 8–27)
Bilirubin Total: 0.6 mg/dL (ref 0.0–1.2)
CALCIUM: 9.6 mg/dL (ref 8.6–10.2)
CO2: 20 mmol/L (ref 20–29)
Chloride: 102 mmol/L (ref 96–106)
Creatinine, Ser: 1.26 mg/dL (ref 0.76–1.27)
GFR, EST AFRICAN AMERICAN: 68 mL/min/{1.73_m2} (ref >59)
GFR, EST NON AFRICAN AMERICAN: 59 mL/min/{1.73_m2} — AB (ref >59)
Globulin, Total: 3.3 g/dL (ref 1.5–4.5)
Glucose: 81 mg/dL (ref 65–99)
POTASSIUM: 4.5 mmol/L (ref 3.5–5.2)
SODIUM: 138 mmol/L (ref 134–144)
TOTAL PROTEIN: 7.6 g/dL (ref 6.0–8.5)

## 2018-04-14 LAB — LIPID PANEL
CHOL/HDL RATIO: 2.4 ratio (ref 0.0–5.0)
Cholesterol, Total: 188 mg/dL (ref 100–199)
HDL: 79 mg/dL (ref >39)
LDL Calculated: 79 mg/dL (ref 0–99)
Triglycerides: 150 mg/dL — ABNORMAL HIGH (ref 0–149)
VLDL Cholesterol Cal: 30 mg/dL (ref 5–40)

## 2018-04-14 NOTE — Telephone Encounter (Signed)
-----   Message from [name], PA-C sent at 04/14/2018  8:47 AM EDT ----- Liver and kidney results normal. Triglycerides slightly high. Eat less sweets, fats, and exercise more often. May also take OTC Omega 3 fish oil pills.

## 2018-04-14 NOTE — Telephone Encounter (Signed)
Patient is aware that liver and kidney results are normal. Triglycerides slightly high. Eat less sweets, fats and exercise more often; may also take an OTC omega 3 fish oil pills. Nat Christen, CMA

## 2018-07-13 IMAGING — DX DG CHEST 1V PORT
1 series · 1 of 1 positions shown · non-contrast
Comparison: Chest radiograph performed 05/22/2017

CLINICAL DATA: Nasogastric tube placement.

EXAM:
PORTABLE CHEST 1 VIEW

[chest ap]
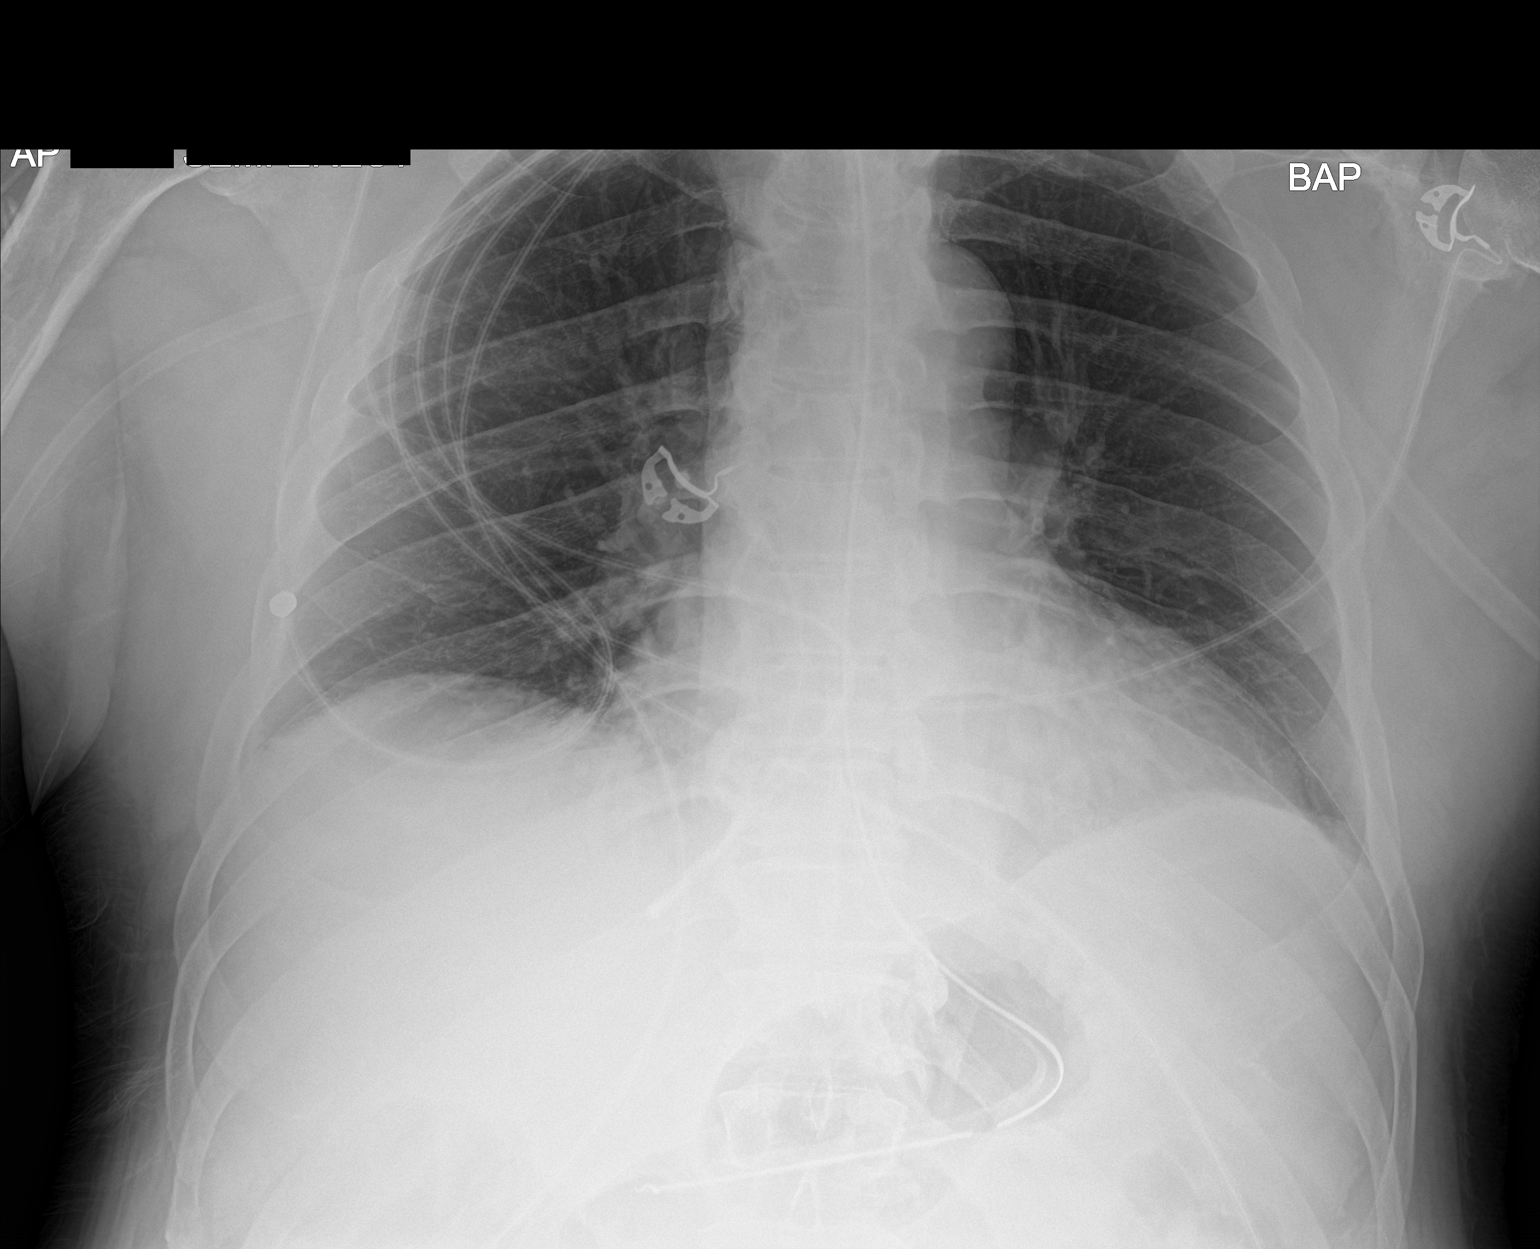

[1 of 1 positions shown; findings below may reference images not displayed]

FINDINGS: The patient's endotracheal tube is seen ending 7 cm above the
carina. An enteric tube is noted ending overlying the antrum of the
stomach.

The lungs are well-aerated and clear. There is no evidence of focal
opacification, pleural effusion or pneumothorax.

The cardiomediastinal silhouette is mildly enlarged. No acute
osseous abnormalities are seen.
IMPRESSION: 1. Enteric tube noted ending overlying the antrum of the stomach.
2. Endotracheal tube seen ending 7 cm above the carina. This could
be advanced 3 cm, as deemed clinically appropriate.
3. No acute cardiopulmonary process seen.  Mild cardiomegaly.

## 2018-10-13 ENCOUNTER — Ambulatory Visit (INDEPENDENT_AMBULATORY_CARE_PROVIDER_SITE_OTHER): Payer: Self-pay | Admitting: Primary Care

## 2018-11-24 ENCOUNTER — Encounter: Payer: Self-pay | Admitting: Primary Care

## 2018-11-24 ENCOUNTER — Other Ambulatory Visit: Payer: Self-pay

## 2018-11-24 ENCOUNTER — Ambulatory Visit: Payer: Medicare Other | Attending: Physician Assistant | Admitting: Primary Care

## 2018-11-24 DIAGNOSIS — J9601 Acute respiratory failure with hypoxia: Secondary | ICD-10-CM | POA: Insufficient documentation

## 2018-11-24 DIAGNOSIS — D473 Essential (hemorrhagic) thrombocythemia: Secondary | ICD-10-CM

## 2018-11-24 DIAGNOSIS — L308 Other specified dermatitis: Secondary | ICD-10-CM | POA: Diagnosis not present

## 2018-11-24 DIAGNOSIS — I5032 Chronic diastolic (congestive) heart failure: Secondary | ICD-10-CM

## 2018-11-24 DIAGNOSIS — I10 Essential (primary) hypertension: Secondary | ICD-10-CM

## 2018-11-24 DIAGNOSIS — D649 Anemia, unspecified: Secondary | ICD-10-CM

## 2018-11-24 DIAGNOSIS — F1020 Alcohol dependence, uncomplicated: Secondary | ICD-10-CM

## 2018-11-24 MED ORDER — TRIAMCINOLONE ACETONIDE 0.1 % EX OINT: 1.0000 "application " | TOPICAL_OINTMENT | Freq: Every day | CUTANEOUS | 1 refills | Status: DC

## 2018-11-24 MED ORDER — METOPROLOL TARTRATE 25 MG PO TABS: 25.0000 mg | ORAL_TABLET | Freq: Two times a day (BID) | ORAL | 0 refills | Status: DC

## 2018-11-24 MED ORDER — AMLODIPINE BESYLATE 10 MG PO TABS: 10.0000 mg | ORAL_TABLET | Freq: Every day | ORAL | 0 refills | Status: DC

## 2018-11-24 NOTE — Progress Notes (Signed)
Virtual Visit via Telephone Note  I connected with [name] on 11/24/18 at  8:35 AM EDT by telephone and verified that I am speaking with the correct person using two identifiers.   I discussed the limitations, risks, security and privacy concerns of performing an evaluation and management service by telephone and the availability of in person appointments. I also discussed with the patient that there may be a patient responsible charge related to this service. The patient expressed understanding and agreed to proceed.   History of Present Illness: [name] is being following up for HTN and eczema.  PMH: anemia and drinks about 1.8 - 2.4 oz of alcohol per week. Reviewing records dx of chronic diastolic (CHF) will refer to cardiology for evaluation.    Observations/Objective: Review of Systems  Constitutional: Negative.   HENT: Negative.   Eyes: Negative.   Respiratory: Negative.   Cardiovascular: Negative.   Gastrointestinal: Negative.   Genitourinary: Negative.   Musculoskeletal: Negative.   Skin: Negative.   Neurological: Negative.   Endo/Heme/Allergies: Negative.   Psychiatric/Behavioral: Negative.     Assessment and Plan: Raymond Stone was seen today for follow-up.  Diagnoses and all orders for this visit:  Eczema craquele  triamcinolone ointment (KENALOG) 0.1 %; Apply 1 application topically daily after lunch. For eczema on all areas except face  Normocytic anemia Will ck CBC   Essential hypertension -     amLODipine (NORVASC) 10 MG tablet; Take 1 tablet (10 mg total) by mouth daily. -     metoprolol tartrate (LOPRESSOR) 25 MG tablet; Take 1 tablet (25 mg total) by mouth 2 (two) times daily.  Chronic diastolic CHF (congestive heart failure) ([place]) Will have evaluated cardiology   Alcohol dependence, uncomplicated ([place]) Discuss has to stop and ways to approach it. He does admitted with this pandemic its a lot of uncertainty but assured him ethol was not the  solution  Other orders -     triamcinolone ointment (KENALOG) 0.1 %; Apply 1 application topically daily after lunch. For eczema on all areas except face    Follow Up Instructions:    I discussed the assessment and treatment plan with the patient. The patient was provided an opportunity to ask questions and all were answered. The patient agreed with the plan and demonstrated an understanding of the instructions.   The patient was advised to call back or seek an in-person evaluation if the symptoms worsen or if the condition fails to improve as anticipated.  I provided 35 minutes of non-face-to-face time during this encounter. This also includes reviewing previous visit Raymond Stone   Raymond Perna, NP

## 2018-12-01 ENCOUNTER — Ambulatory Visit: Payer: Self-pay | Admitting: Cardiology

## 2018-12-09 ENCOUNTER — Ambulatory Visit: Payer: Self-pay | Admitting: Cardiology

## 2018-12-09 ENCOUNTER — Telehealth: Payer: Self-pay | Admitting: Primary Care

## 2018-12-09 NOTE — Telephone Encounter (Signed)
[facility] states that a referral was sent to them for the pt but they need medical records, informed to send a Release of medical records but they said Sharyn Lull told them it would be sent. Please f/u

## 2018-12-12 ENCOUNTER — Ambulatory Visit: Payer: Self-pay | Admitting: Cardiology

## 2018-12-12 NOTE — Telephone Encounter (Signed)
Noted  

## 2018-12-12 NOTE — Telephone Encounter (Signed)
Please provide medical records to referred place for patients care.

## 2018-12-15 ENCOUNTER — Other Ambulatory Visit: Payer: Self-pay

## 2018-12-15 ENCOUNTER — Encounter: Payer: Self-pay | Admitting: Cardiology

## 2018-12-15 ENCOUNTER — Ambulatory Visit (INDEPENDENT_AMBULATORY_CARE_PROVIDER_SITE_OTHER): Payer: Medicare Other | Admitting: Cardiology

## 2018-12-15 VITALS — BP 151/86 | HR 80 | Temp 97.3°F | Ht 69.0 in | Wt 219.1 lb

## 2018-12-15 DIAGNOSIS — I10 Essential (primary) hypertension: Secondary | ICD-10-CM

## 2018-12-15 NOTE — Progress Notes (Signed)
Patient referred by Raymond Demark, PA-C for heart failure  Subjective:   Raymond Stone, male    DOB: 19-Jan-1951, 68 y.o.   MRN: [id number]   Chief Complaint  Patient presents with  . New Patient (Initial Visit)  . Diastolic Heart Failure    HPI  68 y.o. African American male with hypertension, alcohol abuse, eczema, referred for evaluation of possible diastolic heart failure.  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  He is compliant with his antihypertensive therapy, but endorses salt intake.  He loves eating at [facility].  As such, he does not have any overt symptoms of shortness of breath, orthopnea, PND, leg edema.  He denies any chest pain.  He walks in the parking lot outside his apartment, which includes a hill.  He does not have any significant shortness of breath with regular activity.  He endorses only minimal shortness of breath walking uphill at faster rates than usual.  Blood pressure is elevated today.  Previous echocardiogram in 2017 showed structurally normal heart, with mild pulmonary hypertension, and elevated filling pressures.  He endorses that he drinks beer every now and then, but denies dependence.   Past Medical History:  Diagnosis Date  . Anemia   . Eczema   . Fluid collection (edema) in the arms, legs, hands and feet   . Hypertension      Past Surgical History:  Procedure Laterality Date  . COLONOSCOPY    . I&D EXTREMITY Right 09/13/2012   Procedure: IRRIGATION AND DEBRIDEMENT EXTREMITY  RIGHT MIDDLE FINGER WITH REVISION AMPUTATION AND SKIN GRAFTING.;  Surgeon: Roseanne Kaufman, MD;  Location: [facility];  Service: Orthopedics;  Laterality: Right;     Social History   Socioeconomic History  . Marital status: Single    Spouse name: Not on file  . Number of children: Not on file  . Years of education: Not on file  . Highest education level: Not on file  Occupational History  . Not on file  Social Needs  .  Financial resource strain: Not on file  . Food insecurity:    Worry: Not on file    Inability: Not on file  . Transportation needs:    Medical: Not on file    Non-medical: Not on file  Tobacco Use  . Smoking status: Never Smoker  . Smokeless tobacco: Never Used  Substance and Sexual Activity  . Alcohol use: Yes    Alcohol/week: 3.0 - 4.0 standard drinks    Types: 3 - 4 Cans of beer per week    Comment: beer everyday  . Drug use: No  . Sexual activity: Yes    Partners: Female    Birth control/protection: Condom  Lifestyle  . Physical activity:    Days per week: Not on file    Minutes per session: Not on file  . Stress: Not on file  Relationships  . Social connections:    Talks on phone: Not on file    Gets together: Not on file    Attends religious service: Not on file    Active member of club or organization: Not on file    Attends meetings of clubs or organizations: Not on file    Relationship status: Not on file  . Intimate partner violence:    Fear of current or ex partner: Not on file    Emotionally abused: Not on file    Physically abused: Not on file    Forced  sexual activity: Not on file  Other Topics Concern  . Not on file  Social History Narrative  . Not on file     Family History  Problem Relation Age of Onset  . Lupus Daughter   . Stomach cancer Brother   . Colon cancer Neg Hx   . Colon polyps Neg Hx   . Esophageal cancer Neg Hx   . Rectal cancer Neg Hx      Current Outpatient Medications on File Prior to Visit  Medication Sig Dispense Refill  . amLODipine (NORVASC) 10 MG tablet Take 1 tablet (10 mg total) by mouth daily. 90 tablet 0  . docusate sodium (COLACE) 100 MG capsule Take 100 mg 2 (two) times daily as needed by mouth for mild constipation.    . metoprolol tartrate (LOPRESSOR) 25 MG tablet Take 1 tablet (25 mg total) by mouth 2 (two) times daily. 180 tablet 0  . Multiple Vitamin (MULTIVITAMIN WITH MINERALS) TABS tablet Take 1 tablet daily  by mouth. (Patient not taking: Reported on 04/13/2018) 30 tablet 6  . triamcinolone ointment (KENALOG) 0.1 % Apply 1 application topically daily after lunch. For eczema on all areas except face 30 g 1   No current facility-administered medications on file prior to visit.     Cardiovascular studies:  EKG 12/15/2018: Sinus rhythm 80 bpm.   Old anteroseptal infarct. Nonspecific ST-T changes.   Echocardiogram 2017: - Left ventricle: The cavity size was normal. Wall thickness was   increased in a pattern of mild LVH. Systolic function was normal.   The estimated ejection fraction was in the range of 60% to 65%.   Normal GLPSS at -21%. Wall motion was normal; there were no   regional wall motion abnormalities. Doppler parameters are   consistent with abnormal left ventricular relaxation (grade 1   diastolic dysfunction). The E/e&' ratio is >15, suggesting   elevated LV filling pressure. - Mitral valve: Mildly thickened leaflets . There was trivial   regurgitation. - Left atrium: The atrium was moderately dilated. - Atrial septum: Mobile IAS - cannot exclude a small PFO. - Tricuspid valve: There was mild regurgitation. - Pulmonary arteries: PA peak pressure: 33 mm Hg (S). - Inferior vena cava: The vessel was normal in size. The   respirophasic diameter changes were in the normal range (>= 50%),   consistent with normal central venous pressure.  Impressions:  - LVEF 60-65%, mild LVH, normal wall motion, diastolic dysfunction   with elevated LV filling pressure, normal GLPSS at -21%, trivial   MR, moderate LAE, mild TR, top normal RVSP of 33 mmHG, normal   IVC, mobile IAS - cannot exclude small PFO.   Recent labs: Results for TYROME, DONATELLI (MRN [id number]) as of 12/15/2018 09:28  Ref. Range 04/13/2018 08:56  COMPREHENSIVE METABOLIC PANEL Unknown Rpt (A)  Sodium Latest Ref Range: 134 - 144 mmol/L 138  Potassium Latest Ref Range: 3.5 - 5.2 mmol/L 4.5  Chloride Latest Ref Range: 96 -  106 mmol/L 102  CO2 Latest Ref Range: 20 - 29 mmol/L 20  Glucose Latest Ref Range: 65 - 99 mg/dL 81  BUN Latest Ref Range: 8 - 27 mg/dL 23  Creatinine Latest Ref Range: 0.76 - 1.27 mg/dL 1.26  Calcium Latest Ref Range: 8.6 - 10.2 mg/dL 9.6  BUN/Creatinine Ratio Latest Ref Range: 10 - 24  18  Alkaline Phosphatase Latest Ref Range: 39 - 117 IU/L 106  Albumin Latest Ref Range: 3.6 - 4.8 g/dL 4.3  Albumin/Globulin Ratio Latest  Ref Range: 1.2 - 2.2  1.3  AST Latest Ref Range: 0 - 40 IU/L 18  ALT Latest Ref Range: 0 - 44 IU/L 12  Total Protein Latest Ref Range: 6.0 - 8.5 g/dL 7.6  Total Bilirubin Latest Ref Range: 0.0 - 1.2 mg/dL 0.6  GFR, Est Non African American Latest Ref Range: >59 mL/min/1.73 59 (L)  GFR, Est African American Latest Ref Range: >59 mL/min/1.73 68  Total CHOL/HDL Ratio Latest Ref Range: 0.0 - 5.0 ratio 2.4  Cholesterol, Total Latest Ref Range: 100 - 199 mg/dL 188  HDL Cholesterol Latest Ref Range: >39 mg/dL 79  LDL (calc) Latest Ref Range: 0 - 99 mg/dL 79  Triglycerides Latest Ref Range: 0 - 149 mg/dL 150 (H)  VLDL Cholesterol Cal Latest Ref Range: 5 - 40 mg/dL 30  Globulin, Total Latest Ref Range: 1.5 - 4.5 g/dL 3.3   Results for ZEPHYR, RIDLEY (MRN [id number]) as of 12/15/2018 09:28  Ref. Range 02/24/2016 09:21  Anti Nuclear Antibody (ANA) Latest Ref Range: NEGATIVE  POS (A)  ANA Pattern 1 Unknown HOMOGENEOUS (A)  ANA Titer 1 Latest Units: titer 1:80 (H)  ds DNA Ab Latest Units: IU/mL <1  ENA SM Ab Ser-aCnc Latest Ref Range: <1.0 NEG AI  5.2 POS (H)  C3 Complement Latest Ref Range: 90 - 180 mg/dL 111  C4 Complement Latest Ref Range: 16 - 47 mg/dL 27  SSA (Ro) (ENA) Antibody, IgG Latest Ref Range: <1.0 NEG AI  1.7 POS (H)  SSB (La) (ENA) Antibody, IgG Latest Ref Range: <1.0 NEG AI  <1.0 NEG  Scleroderma (Scl-70) (ENA) Antibody, IgG Latest Ref Range: <1.0 NEG AI  <1.0 NEG  SM/RNP Latest Ref Range: <1.0 NEG AI  1.5 POS (H)     Review of Systems  Constitution:  Negative for decreased appetite, malaise/fatigue, weight gain and weight loss.  HENT: Negative for congestion.   Eyes: Negative for visual disturbance.  Cardiovascular: Positive for dyspnea on exertion (Minimal). Negative for chest pain, leg swelling, palpitations and syncope.  Respiratory: Positive for shortness of breath (Minimal). Negative for cough.   Endocrine: Negative for cold intolerance.  Hematologic/Lymphatic: Does not bruise/bleed easily.  Skin: Negative for itching and rash.  Musculoskeletal: Negative for myalgias.  Gastrointestinal: Negative for abdominal pain, nausea and vomiting.  Genitourinary: Negative for dysuria.  Neurological: Negative for dizziness and weakness.  Psychiatric/Behavioral: The patient is not nervous/anxious.   All other systems reviewed and are negative.        Vitals:   12/15/18 0917  BP: (!) 151/86  Pulse: 80  Temp: (!) 97.3 F (36.3 C)  SpO2: 96%     Body mass index is 32.36 kg/m. Filed Weights   12/15/18 0917  Weight: 219 lb 1.6 oz (99.4 kg)     Objective:   Physical Exam  Constitutional: He is oriented to person, place, and time. He appears well-developed and well-nourished. No distress.  HENT:  Head: Normocephalic and atraumatic.  Eyes: Pupils are equal, round, and reactive to light. Conjunctivae are normal.  Neck: No JVD present.  Cardiovascular: Normal rate, regular rhythm and intact distal pulses.  Pulmonary/Chest: Effort normal and breath sounds normal. He has no wheezes. He has no rales.  Abdominal: Soft. Bowel sounds are normal. There is no rebound.  Musculoskeletal:        General: No edema.  Lymphadenopathy:    He has no cervical adenopathy.  Neurological: He is alert and oriented to person, place, and time. No cranial nerve deficit.  Skin: Skin is warm and dry.  Psychiatric: He has a normal mood and affect.  Nursing note and vitals reviewed.         Assessment & Recommendations:   68 y.o. African American  male with hypertension, eczema, referred for evaluation of possible diastolic heart failure.  Hypertension: Suboptimal control.  I have asked him to increase metoprolol to 25 mg 3 times daily. I reviewed his previous echocardiogram in 2017 that shows normal EF, with elevated filling pressures.  While he does have diastolic dysfunction on echocardiogram in 2017, history and physical is underwhelming for clinical evidence of heart failure. I do not think he needs any further cardiac work-up at this time.  I counseled him regarding diet modification, especially salt reduction.  I have asked him to increase his metoprolol to 25 mg 3 times daily.  Of note, he has history of angioedema to lisinopril, and this should be avoided.  Continue follow-up with PCP. I will see him on as-needed basis.   Thank you for referring the patient to [place]. Please feel free to contact with any questions.  Nigel Mormon, MD [facility] Pager: [phone] Office: [phone] If no answer Cell [phone]

## 2019-01-11 ENCOUNTER — Other Ambulatory Visit: Payer: Self-pay | Admitting: Primary Care

## 2019-01-11 DIAGNOSIS — I10 Essential (primary) hypertension: Secondary | ICD-10-CM

## 2019-03-12 ENCOUNTER — Other Ambulatory Visit: Payer: Self-pay | Admitting: Primary Care

## 2019-03-12 DIAGNOSIS — I10 Essential (primary) hypertension: Secondary | ICD-10-CM

## 2019-06-30 ENCOUNTER — Ambulatory Visit (INDEPENDENT_AMBULATORY_CARE_PROVIDER_SITE_OTHER): Payer: Medicare Other | Admitting: Primary Care

## 2019-07-05 ENCOUNTER — Other Ambulatory Visit (INDEPENDENT_AMBULATORY_CARE_PROVIDER_SITE_OTHER): Payer: Self-pay | Admitting: Primary Care

## 2019-07-05 DIAGNOSIS — I10 Essential (primary) hypertension: Secondary | ICD-10-CM

## 2019-07-05 MED ORDER — METOPROLOL TARTRATE 25 MG PO TABS: 25.0000 mg | ORAL_TABLET | Freq: Three times a day (TID) | ORAL | 1 refills | Status: DC

## 2019-07-05 NOTE — Progress Notes (Unsigned)
Per cardiology increased metoprolol 25mg  TID follow up as needed. Refilled per recommendation.

## 2020-01-15 ENCOUNTER — Other Ambulatory Visit (INDEPENDENT_AMBULATORY_CARE_PROVIDER_SITE_OTHER): Payer: Self-pay | Admitting: Primary Care

## 2020-01-15 DIAGNOSIS — I10 Essential (primary) hypertension: Secondary | ICD-10-CM

## 2020-01-16 ENCOUNTER — Other Ambulatory Visit (INDEPENDENT_AMBULATORY_CARE_PROVIDER_SITE_OTHER): Payer: Self-pay | Admitting: Primary Care

## 2020-01-16 ENCOUNTER — Encounter: Payer: Self-pay | Admitting: *Deleted

## 2020-01-16 DIAGNOSIS — I10 Essential (primary) hypertension: Secondary | ICD-10-CM

## 2020-01-16 NOTE — Telephone Encounter (Signed)
This encounter was created in error - please disregard.

## 2020-01-16 NOTE — Telephone Encounter (Signed)
Refill request for Metoprolol; last refilled 10/09/19, #270; no valid encounter within last 6 months; no upcoming appts noted; pt notified of the above; decision tree completed; pt offered and accepted appt with Sharyn Lull Edwards,01/23/20 at 0910; 30 day courtesy refill granted; will route to office for notification. Requested Prescriptions  Pending Prescriptions Disp Refills   metoprolol tartrate (LOPRESSOR) 25 MG tablet [Pharmacy Med Name: METOPROLOL TARTRATE 25MG  TABLETS] 270 tablet 1    Sig: TAKE 1 TABLET(25 MG) BY MOUTH THREE TIMES DAILY     Cardiovascular:  Beta Blockers Failed - 01/15/2020 10:36 AM      Failed - Last BP in normal range    BP Readings from Last 1 Encounters:  12/15/18 (!) 151/86         Failed - Valid encounter within last 6 months    Recent Outpatient Visits          1 year ago [place], [place], NP   1 year ago Essential hypertension   [place], Roger David, PA-C   2 years ago Essential hypertension   [place], Roger David, PA-C   2 years ago Essential hypertension   [place], Roger David, PA-C   2 years ago Essential hypertension   [place], Maylon Peppers, FNP      Future Appointments            In 1 week [name], NP [facility]           Passed - Last Heart Rate in normal range    Pulse Readings from Last 1 Encounters:  12/15/18 80

## 2020-01-23 ENCOUNTER — Ambulatory Visit (INDEPENDENT_AMBULATORY_CARE_PROVIDER_SITE_OTHER): Payer: Medicare Other | Admitting: Primary Care

## 2020-01-23 ENCOUNTER — Other Ambulatory Visit: Payer: Self-pay

## 2020-01-23 ENCOUNTER — Encounter (INDEPENDENT_AMBULATORY_CARE_PROVIDER_SITE_OTHER): Payer: Self-pay | Admitting: Primary Care

## 2020-01-23 VITALS — BP 142/84 | HR 96 | Temp 98.5°F | Ht 69.0 in | Wt 193.8 lb

## 2020-01-23 DIAGNOSIS — Z23 Encounter for immunization: Secondary | ICD-10-CM

## 2020-01-23 DIAGNOSIS — M1000 Idiopathic gout, unspecified site: Secondary | ICD-10-CM | POA: Diagnosis not present

## 2020-01-23 DIAGNOSIS — Z13220 Encounter for screening for lipoid disorders: Secondary | ICD-10-CM

## 2020-01-23 DIAGNOSIS — Z131 Encounter for screening for diabetes mellitus: Secondary | ICD-10-CM

## 2020-01-23 DIAGNOSIS — I10 Essential (primary) hypertension: Secondary | ICD-10-CM

## 2020-01-23 LAB — POCT GLYCOSYLATED HEMOGLOBIN (HGB A1C): Hemoglobin A1C: 5.3 % (ref 4.0–5.6)

## 2020-01-23 MED ORDER — METOPROLOL TARTRATE 25 MG PO TABS: ORAL_TABLET | ORAL | 1 refills | Status: DC

## 2020-01-23 MED ORDER — AMLODIPINE BESYLATE 10 MG PO TABS: 10.0000 mg | ORAL_TABLET | Freq: Every day | ORAL | 1 refills | Status: DC

## 2020-01-23 NOTE — Patient Instructions (Signed)
 Gout  Gout is painful swelling of your joints. Gout is a type of arthritis. It is caused by having too much uric acid in your body. Uric acid is a chemical that is made when your body breaks down substances called purines. If your body has too much uric acid, sharp crystals can form and build up in your joints. This causes pain and swelling. Gout attacks can happen quickly and be very painful (acute gout). Over time, the attacks can affect more joints and happen more often (chronic gout). What are the causes?  Too much uric acid in your blood. This can happen because: ? Your kidneys do not remove enough uric acid from your blood. ? Your body makes too much uric acid. ? You eat too many foods that are high in purines. These foods include organ meats, some seafood, and beer.  Trauma or stress. What increases the risk?  Having a family history of gout.  Being male and middle-aged.  Being male and having gone through menopause.  Being very overweight (obese).  Drinking alcohol, especially beer.  Not having enough water in the body (being dehydrated).  Losing weight too quickly.  Having an organ transplant.  Having lead poisoning.  Taking certain medicines.  Having kidney disease.  Having a skin condition called psoriasis. What are the signs or symptoms? An attack of acute gout usually happens in just one joint. The most common place is the big toe. Attacks often start at night. Other joints that may be affected include joints of the feet, ankle, knee, fingers, wrist, or elbow. Symptoms of an attack may include:  Very bad pain.  Warmth.  Swelling.  Stiffness.  Shiny, red, or purple skin.  Tenderness. The affected joint may be very painful to touch.  Chills and fever. Chronic gout may cause symptoms more often. More joints may be involved. You may also have white or yellow lumps (tophi) on your hands or feet or in other areas near your joints. How is this  treated?  Treatment for this condition has two phases: treating an acute attack and preventing future attacks.  Acute gout treatment may include: ? NSAIDs. ? Steroids. These are taken by mouth or injected into a joint. ? Colchicine. This medicine relieves pain and swelling. It can be given by mouth or through an IV tube.  Preventive treatment may include: ? Taking small doses of NSAIDs or colchicine daily. ? Using a medicine that reduces uric acid levels in your blood. ? Making changes to your diet. You may need to see a food expert (dietitian) about what to eat and drink to prevent gout. Follow these instructions at home: During a gout attack   If told, put ice on the painful area: ? Put ice in a plastic bag. ? Place a towel between your skin and the bag. ? Leave the ice on for 20 minutes, 2-3 times a day.  Raise (elevate) the painful joint above the level of your heart as often as you can.  Rest the joint as much as possible. If the joint is in your leg, you may be given crutches.  Follow instructions from your doctor about what you cannot eat or drink. Avoiding future gout attacks  Eat a low-purine diet. Avoid foods and drinks such as: ? Liver. ? Kidney. ? Anchovies. ? Asparagus. ? Herring. ? Mushrooms. ? Mussels. ? Beer.  Stay at a healthy weight. If you want to lose weight, talk with your doctor. Do not lose   weight too fast.  Start or continue an exercise plan as told by your doctor. Eating and drinking  Drink enough fluids to keep your pee (urine) pale yellow.  If you drink alcohol: ? Limit how much you use to:  0-1 drink a day for women.  0-2 drinks a day for men. ? Be aware of how much alcohol is in your drink. In the [place] one drink equals one 12 oz bottle of beer (355 mL), one 5 oz glass of wine (148 mL), or one 1 oz glass of hard liquor (44 mL). General instructions  Take over-the-counter and prescription medicines only as told by your doctor.  Do  not drive or use heavy machinery while taking prescription pain medicine.  Return to your normal activities as told by your doctor. Ask your doctor what activities are safe for you.  Keep all follow-up visits as told by your doctor. This is important. Contact a doctor if:  You have another gout attack.  You still have symptoms of a gout attack after 10 days of treatment.  You have problems (side effects) because of your medicines.  You have chills or a fever.  You have burning pain when you pee (urinate).  You have pain in your lower back or belly. Get help right away if:  You have very bad pain.  Your pain cannot be controlled.  You cannot pee. Summary  Gout is painful swelling of the joints.  The most common site of pain is the big toe, but it can affect other joints.  Medicines and avoiding some foods can help to prevent and treat gout attacks. This information is not intended to replace advice given to you by your health care provider. Make sure you discuss any questions you have with your health care provider. Document Revised: 01/19/2018 Document Reviewed: 01/19/2018 Elsevier Patient Education  2020 [employer]  

## 2020-01-23 NOTE — Progress Notes (Signed)
Established Patient Office Visit  Subjective:  Patient ID: Raymond Stone, male    DOB: 1950-10-12  Age: 69 y.o. MRN: [id number]  CC:  Chief Complaint  Patient presents with  . Establish Care    gout  . Medication Refill    HPI Raymond Stone presents for to re -establish care and requesting medication refills and expressed gout is giving him a "fit". We discussed the causes of gout and 2 problems he partakes in will cause him to have frequent flare ups.  Past Medical History:  Diagnosis Date  . Anemia   . Eczema   . Fluid collection (edema) in the arms, legs, hands and feet   . Hypertension     Past Surgical History:  Procedure Laterality Date  . COLONOSCOPY    . FINGER SURGERY Right 2013  . I & D EXTREMITY Right 09/13/2012   Procedure: IRRIGATION AND DEBRIDEMENT EXTREMITY  RIGHT MIDDLE FINGER WITH REVISION AMPUTATION AND SKIN GRAFTING.;  Surgeon: Roseanne Kaufman, MD;  Location: [facility];  Service: Orthopedics;  Laterality: Right;    Family History  Problem Relation Age of Onset  . Lupus Daughter   . Stomach cancer Brother   . Colon cancer Neg Hx   . Colon polyps Neg Hx   . Esophageal cancer Neg Hx   . Rectal cancer Neg Hx     Social History   Socioeconomic History  . Marital status: Single    Spouse name: Not on file  . Number of children: 1  . Years of education: Not on file  . Highest education level: Not on file  Occupational History  . Not on file  Tobacco Use  . Smoking status: Never Smoker  . Smokeless tobacco: Never Used  Vaping Use  . Vaping Use: Never used  Substance and Sexual Activity  . Alcohol use: Yes    Alcohol/week: 3.0 - 4.0 standard drinks    Types: 3 - 4 Cans of beer per week    Comment: beer everyday  . Drug use: No  . Sexual activity: Yes    Partners: Female    Birth control/protection: Condom  Other Topics Concern  . Not on file  Social History Narrative  . Not on file   Social Determinants of Health   Financial Resource  Strain:   . Difficulty of Paying Living Expenses:   Food Insecurity:   . Worried About Charity fundraiser in the Last Year:   . Arboriculturist in the Last Year:   Transportation Needs:   . Film/video editor (Medical):   Marland Kitchen Lack of Transportation (Non-Medical):   Physical Activity:   . Days of Exercise per Week:   . Minutes of Exercise per Session:   Stress:   . Feeling of Stress :   Social Connections:   . Frequency of Communication with Friends and Family:   . Frequency of Social Gatherings with Friends and Family:   . Attends Religious Services:   . Active Member of Clubs or Organizations:   . Attends Archivist Meetings:   Marland Kitchen Marital Status:   Intimate Partner Violence:   . Fear of Current or Ex-Partner:   . Emotionally Abused:   Marland Kitchen Physically Abused:   . Sexually Abused:     Outpatient Medications Prior to Visit  Medication Sig Dispense Refill  . desonide (DESOWEN) 0.05 % ointment daily.    Marland Kitchen triamcinolone ointment (KENALOG) 0.1 % Apply 1 application topically daily after lunch. For  eczema on all areas except face 30 g 1  . amLODipine (NORVASC) 10 MG tablet Take 1 tablet (10 mg total) by mouth daily. 90 tablet 0  . metoprolol tartrate (LOPRESSOR) 25 MG tablet TAKE 1 TABLET(25 MG) BY MOUTH THREE TIMES DAILY 90 tablet 0  . docusate sodium (COLACE) 100 MG capsule Take 100 mg 2 (two) times daily as needed by mouth for mild constipation. (Patient not taking: Reported on 01/23/2020)     No facility-administered medications prior to visit.    Allergies  Allergen Reactions  . Lisinopril Swelling    ANGIOEDEMA    ROS Review of Systems  All other systems reviewed and are negative.     Objective:    Physical Exam Vitals reviewed.  Constitutional:      Appearance: He is obese.  HENT:     Head: Normocephalic.     Right Ear: Tympanic membrane normal.     Left Ear: Tympanic membrane normal.  Eyes:     Extraocular Movements: Extraocular movements intact.      Pupils: Pupils are equal, round, and reactive to light.  Cardiovascular:     Rate and Rhythm: Normal rate and regular rhythm.  Pulmonary:     Effort: Pulmonary effort is normal.     Breath sounds: Normal breath sounds.  Abdominal:     General: Bowel sounds are normal.  Musculoskeletal:        General: Normal range of motion.     Cervical back: Normal range of motion and neck supple.  Skin:    General: Skin is warm and dry.  Neurological:     Mental Status: He is oriented to person, place, and time.  Psychiatric:        Mood and Affect: Mood normal.        Behavior: Behavior normal.        Thought Content: Thought content normal.        Judgment: Judgment normal.     BP (!) 142/84 (BP Location: Left Arm, Patient Position: Sitting, Cuff Size: Normal)   Pulse 96   Temp 98.5 F (36.9 C) (Oral)   Ht '5\' 9"'  (1.753 m)   Wt 193 lb 12.8 oz (87.9 kg)   SpO2 96%   BMI 28.62 kg/m  Wt Readings from Last 3 Encounters:  01/23/20 193 lb 12.8 oz (87.9 kg)  12/15/18 219 lb 1.6 oz (99.4 kg)  04/13/18 201 lb 12.8 oz (91.5 kg)     Health Maintenance Due  Topic Date Due  . COVID-19 Vaccine (1) Never done    There are no preventive care reminders to display for this patient.  Lab Results  Component Value Date   TSH 1.310 05/17/2017   Lab Results  Component Value Date   WBC 9.5 01/23/2020   HGB 13.6 01/23/2020   HCT 40.3 01/23/2020   MCV 93 01/23/2020   PLT 384 01/23/2020   Lab Results  Component Value Date   NA 136 01/23/2020   K 4.5 01/23/2020   CO2 22 01/23/2020   GLUCOSE 109 (H) 01/23/2020   BUN 8 01/23/2020   CREATININE 1.14 01/23/2020   BILITOT 0.8 01/23/2020   ALKPHOS 129 (H) 01/23/2020   AST 39 01/23/2020   ALT 19 01/23/2020   PROT 7.9 01/23/2020   ALBUMIN 4.0 01/23/2020   CALCIUM 9.9 01/23/2020   ANIONGAP 7 05/26/2017   Lab Results  Component Value Date   CHOL 181 01/23/2020   Lab Results  Component Value Date  HDL 94 01/23/2020   Lab Results   Component Value Date   LDLCALC 75 01/23/2020   Lab Results  Component Value Date   TRIG 65 01/23/2020   Lab Results  Component Value Date   CHOLHDL 1.9 01/23/2020   Lab Results  Component Value Date   HGBA1C 5.3 01/23/2020      Assessment & Plan:  Raymond Stone was seen today for establish care and medication refill.  Diagnoses and all orders for this visit:  Screening for diabetes mellitus -     HgB A1c 5.3 -     CBC with Differential  Idiopathic gout, unspecified chronicity, unspecified site Gout is an inflammatory arthritis related to a hyperuricemia.  Risk factors greater than 40, male gender, increase in purines red meats and seafoods, alcohol smoking, and obesity greater than 30 -     Uric Acid  Essential hypertension Counseled on blood pressure goal of less than 130/80, low-sodium, DASH diet, medication compliance, 150 minutes of moderate intensity exercise per week. Discussed medication compliance, adverse effects. -     CMP14+EGFR -     amLODipine (NORVASC) 10 MG tablet; Take 1 tablet (10 mg total) by mouth daily. -     metoprolol tartrate (LOPRESSOR) 25 MG tablet; TAKE 1 TABLET(25 MG) BY MOUTH THREE TIMES DAILY  Need for vaccination against Streptococcus pneumoniae using pneumococcal conjugate vaccine 13 CDC recommendations for pneumococcal vaccination to help prevent the spread of pneumococcal disease.     Lipid screening -     Lipid Panel  Other orders -     Pneumococcal conjugate vaccine 13-valent    Meds ordered this encounter  Medications  . amLODipine (NORVASC) 10 MG tablet    Sig: Take 1 tablet (10 mg total) by mouth daily.    Dispense:  90 tablet    Refill:  1  . metoprolol tartrate (LOPRESSOR) 25 MG tablet    Sig: TAKE 1 TABLET(25 MG) BY MOUTH THREE TIMES DAILY    Dispense:  180 tablet    Refill:  1    Courtesy refill; visit needed for additional refills    Follow-up: Return in about 3 months (around 04/24/2020) for Bp follow up .     [name], NP

## 2020-01-24 LAB — CMP14+EGFR
ALT: 19 IU/L (ref 0–44)
AST: 39 IU/L (ref 0–40)
Albumin/Globulin Ratio: 1 — ABNORMAL LOW (ref 1.2–2.2)
Albumin: 4 g/dL (ref 3.8–4.8)
Alkaline Phosphatase: 129 IU/L — ABNORMAL HIGH (ref 48–121)
BUN/Creatinine Ratio: 7 — ABNORMAL LOW (ref 10–24)
BUN: 8 mg/dL (ref 8–27)
Bilirubin Total: 0.8 mg/dL (ref 0.0–1.2)
CO2: 22 mmol/L (ref 20–29)
Calcium: 9.9 mg/dL (ref 8.6–10.2)
Chloride: 99 mmol/L (ref 96–106)
Creatinine, Ser: 1.14 mg/dL (ref 0.76–1.27)
GFR calc Af Amer: 76 mL/min/{1.73_m2} (ref >59)
GFR calc non Af Amer: 66 mL/min/{1.73_m2} (ref >59)
Globulin, Total: 3.9 g/dL (ref 1.5–4.5)
Glucose: 109 mg/dL — ABNORMAL HIGH (ref 65–99)
Potassium: 4.5 mmol/L (ref 3.5–5.2)
Sodium: 136 mmol/L (ref 134–144)
Total Protein: 7.9 g/dL (ref 6.0–8.5)

## 2020-01-24 LAB — CBC WITH DIFFERENTIAL/PLATELET
Basophils Absolute: 0.1 10*3/uL (ref 0.0–0.2)
Basos: 1 %
EOS (ABSOLUTE): 0.1 10*3/uL (ref 0.0–0.4)
Eos: 1 %
Hematocrit: 40.3 % (ref 37.5–51.0)
Hemoglobin: 13.6 g/dL (ref 13.0–17.7)
Immature Grans (Abs): 0.1 10*3/uL (ref 0.0–0.1)
Immature Granulocytes: 1 %
Lymphocytes Absolute: 0.8 10*3/uL (ref 0.7–3.1)
Lymphs: 8 %
MCH: 31.3 pg (ref 26.6–33.0)
MCHC: 33.7 g/dL (ref 31.5–35.7)
MCV: 93 fL (ref 79–97)
Monocytes Absolute: 0.9 10*3/uL (ref 0.1–0.9)
Monocytes: 9 %
Neutrophils Absolute: 7.6 10*3/uL — ABNORMAL HIGH (ref 1.4–7.0)
Neutrophils: 80 %
Platelets: 384 10*3/uL (ref 150–450)
RBC: 4.35 x10E6/uL (ref 4.14–5.80)
RDW: 12.2 % (ref 11.6–15.4)
WBC: 9.5 10*3/uL (ref 3.4–10.8)

## 2020-01-24 LAB — LIPID PANEL
Chol/HDL Ratio: 1.9 ratio (ref 0.0–5.0)
Cholesterol, Total: 181 mg/dL (ref 100–199)
HDL: 94 mg/dL (ref >39)
LDL Chol Calc (NIH): 75 mg/dL (ref 0–99)
Triglycerides: 65 mg/dL (ref 0–149)
VLDL Cholesterol Cal: 12 mg/dL (ref 5–40)

## 2020-01-24 LAB — URIC ACID: Uric Acid: 10.6 mg/dL — ABNORMAL HIGH (ref 3.8–8.4)

## 2020-01-25 ENCOUNTER — Telehealth (INDEPENDENT_AMBULATORY_CARE_PROVIDER_SITE_OTHER): Payer: Self-pay

## 2020-01-25 NOTE — Telephone Encounter (Signed)
Spoke with patient. After confirming his date of birth he was informed that results were normal except elevated uric acid. Emphasized to patient the need to decrease alcohol use. He verbalized understanding of results. Nat Christen, CMA

## 2020-01-25 NOTE — Telephone Encounter (Signed)
-----   Message from [name], NP sent at 01/24/2020  8:09 PM EDT ----- Labs reviewed cholesterol was normal, kidney function is normal , electrolytes and liver enzyme normal and hemoglobin and hematocrit. Uric acid was elevated confirming gout discussed causes of gout attacks to include alcohol encourage to stop drinking on a daily basis.

## 2020-05-09 ENCOUNTER — Other Ambulatory Visit (INDEPENDENT_AMBULATORY_CARE_PROVIDER_SITE_OTHER): Payer: Self-pay | Admitting: Primary Care

## 2020-05-09 MED ORDER — TRIAMCINOLONE ACETONIDE 0.1 % EX OINT: 1.0000 "application " | TOPICAL_OINTMENT | Freq: Every day | CUTANEOUS | 1 refills | Status: DC

## 2020-05-09 NOTE — Telephone Encounter (Signed)
Medication Refill - Medication: triamcinolone ointment (KENALOG) 0.1 %    Has the patient contacted their pharmacy? Yes.   (Agent: If no, request that the patient contact the pharmacy for the refill.) (Agent: If yes, when and what did the pharmacy advise?)  Preferred Pharmacy (with phone number or street name):  [facility] [phone] - [place], [place] AT [address] Phone:  [phone]  Fax:  [phone]       Agent: Please be advised that RX refills may take up to 3 business days. We ask that you follow-up with your pharmacy.

## 2020-06-08 ENCOUNTER — Other Ambulatory Visit (INDEPENDENT_AMBULATORY_CARE_PROVIDER_SITE_OTHER): Payer: Self-pay | Admitting: Primary Care

## 2020-06-08 DIAGNOSIS — I10 Essential (primary) hypertension: Secondary | ICD-10-CM

## 2020-06-08 NOTE — Telephone Encounter (Signed)
Requested Prescriptions  Pending Prescriptions Disp Refills  . metoprolol tartrate (LOPRESSOR) 25 MG tablet [Pharmacy Med Name: METOPROLOL TARTRATE 25MG  TABLETS] 180 tablet 0    Sig: TAKE 1 TABLET(25 MG) BY MOUTH THREE TIMES DAILY     Cardiovascular:  Beta Blockers Failed - 06/08/2020 10:18 AM      Failed - Last BP in normal range    BP Readings from Last 1 Encounters:  01/23/20 (!) 142/84         Passed - Last Heart Rate in normal range    Pulse Readings from Last 1 Encounters:  01/23/20 96         Passed - Valid encounter within last 6 months    Recent Outpatient Visits          4 months ago Screening for diabetes mellitus   [place] [name], NP   1 year ago [place], [place], NP   2 years ago Essential hypertension   [place], Roger David, PA-C   2 years ago Essential hypertension   [place], Roger David, PA-C   2 years ago Essential hypertension   [place], Roger David, PA-C

## 2020-07-10 ENCOUNTER — Other Ambulatory Visit (INDEPENDENT_AMBULATORY_CARE_PROVIDER_SITE_OTHER): Payer: Self-pay | Admitting: Primary Care

## 2020-07-10 NOTE — Telephone Encounter (Signed)
Copied from CRM [id number]. Topic: Quick Communication - Rx Refill/Question >> Jul 10, 2020  1:28 PM Jaquita Rector A wrote: Medication: triamcinolone ointment (KENALOG) 0.1 %  Per patient tube is too small and dont last would like ointment in the jar   Has the patient contacted their pharmacy? Yes.   (Agent: If no, request that the patient contact the pharmacy for the refill.) (Agent: If yes, when and what did the pharmacy advise?)  Preferred Pharmacy (with phone number or street name): [facility] [phone] - [place], [place] - [address]  Phone:  [phone] Fax:  [phone]     Agent: Please be advised that RX refills may take up to 3 business days. We ask that you follow-up with your pharmacy.

## 2020-07-10 NOTE — Telephone Encounter (Signed)
Requested medication (s) are due for refill today:  Yes  Requested medication (s) are on the active medication list:  Yes  Future visit scheduled:  No  Last Refill: 05/09/20; 30 g. / RF x 1  Notes to clinic:  pt. Was recommended to f/u in Oct., but did not do so.  Since Marcelino Duster is out of office until further notice, I did not contact pt. For an appt.  The pt. Would like a larger container, as the tube does not last him long.  Please advise.  Requested Prescriptions  Pending Prescriptions Disp Refills   triamcinolone ointment (KENALOG) 0.1 % 30 g 1    Sig: Apply 1 application topically daily after lunch. For eczema on all areas except face      Dermatology:  Corticosteroids Passed - 07/10/2020  2:25 PM      Passed - Valid encounter within last 12 months    Recent Outpatient Visits           5 months ago Screening for diabetes mellitus   [facility] Grayce Sessions, NP   1 year ago Eczema craquele   [facility] Grayce Sessions, NP   2 years ago Essential hypertension   [facility] Loletta Specter, PA-C   2 years ago Essential hypertension   [facility] Loletta Specter, PA-C   2 years ago Essential hypertension   [facility] Loletta Specter, PA-C

## 2020-07-10 NOTE — Telephone Encounter (Signed)
Pt calling again about refill for Kenalog cream / states he needs it badly/ please advise

## 2020-07-11 NOTE — Telephone Encounter (Signed)
Sent to PCP to refill if appropriate.  

## 2020-07-11 NOTE — Telephone Encounter (Signed)
Copied from CRM [id number]. Topic: Quick Communication - See Telephone Encounter >> Jul 11, 2020 10:01 AM Aretta Nip wrote: CRM for notification. See Telephone encounter for: 07/11/20. Pt wants to know if since he has made an appt if he can now get JAR of the triamcinolone ointment (KENALOG) 0.1 % as requested. His appt date is 07/25/2020. He wants a call back to make sure and to make sure the right size.  [phone]

## 2020-07-11 NOTE — Telephone Encounter (Signed)
Pt called and is requesting the JAR of the triamcinolone ointment (KENALOG) 0.1 %  Last time they gave tube and that is not enough..  [facility] [phone] - [place], [place] - [address]

## 2020-07-18 ENCOUNTER — Telehealth (INDEPENDENT_AMBULATORY_CARE_PROVIDER_SITE_OTHER): Payer: Self-pay | Admitting: Primary Care

## 2020-07-18 ENCOUNTER — Other Ambulatory Visit (INDEPENDENT_AMBULATORY_CARE_PROVIDER_SITE_OTHER): Payer: Self-pay | Admitting: Primary Care

## 2020-07-18 DIAGNOSIS — L308 Other specified dermatitis: Secondary | ICD-10-CM

## 2020-07-18 MED ORDER — TRIAMCINOLONE ACETONIDE 0.1 % EX OINT: 1.0000 "application " | TOPICAL_OINTMENT | Freq: Every day | CUTANEOUS | 0 refills | Status: DC

## 2020-07-18 NOTE — Telephone Encounter (Signed)
Medication Refill - Medication: triamcinolone ointment (KENALOG) 0.1 %   Has the patient contacted their pharmacy? Yes.   (Agent: If no, request that the patient contact the pharmacy for the refill.) (Agent: If yes, when and what did the pharmacy advise?)Pcp has not called in or responded to med request / call pcp   Preferred Pharmacy (with phone number or street name): [facility] [phone] - [place], [place] - [address] AT [facility] [address] [facility] [address]  [address] [place] [address]  Phone:  [phone] Fax:  [phone]   Agent: Please be advised that RX refills may take up to 3 business days. We ask that you follow-up with your pharmacy.

## 2020-07-18 NOTE — Telephone Encounter (Signed)
Sent to PCP to increase if possible.

## 2020-07-18 NOTE — Telephone Encounter (Signed)
Patient called to ask the doctor to increase the dosage of he ointment for his eczema.  He stated he needs it I the white jar which has more that the tube the pharmacy has for him.  He stated that is not enough for his whole body.  Please advise and call patient if you need more information at [phone]

## 2020-07-24 ENCOUNTER — Other Ambulatory Visit (INDEPENDENT_AMBULATORY_CARE_PROVIDER_SITE_OTHER): Payer: Self-pay | Admitting: Primary Care

## 2020-07-24 DIAGNOSIS — L308 Other specified dermatitis: Secondary | ICD-10-CM

## 2020-07-24 DIAGNOSIS — I10 Essential (primary) hypertension: Secondary | ICD-10-CM

## 2020-07-24 MED ORDER — METOPROLOL TARTRATE 25 MG PO TABS: ORAL_TABLET | ORAL | 0 refills | Status: DC

## 2020-07-24 MED ORDER — AMLODIPINE BESYLATE 10 MG PO TABS: 10.0000 mg | ORAL_TABLET | Freq: Every day | ORAL | 1 refills | Status: AC

## 2020-07-24 MED ORDER — TRIAMCINOLONE ACETONIDE 0.1 % EX OINT: 1.0000 "application " | TOPICAL_OINTMENT | Freq: Every day | CUTANEOUS | 0 refills | Status: DC

## 2020-07-24 NOTE — Telephone Encounter (Signed)
   Notes to clinic:  requesting 90 day supply   Requested Prescriptions  Pending Prescriptions Disp Refills   metoprolol tartrate (LOPRESSOR) 25 MG tablet [Pharmacy Med Name: METOPROLOL TARTRATE 25MG  TABLETS] 270 tablet     Sig: TAKE 1 TABLET(25 MG) BY MOUTH THREE TIMES DAILY      Cardiovascular:  Beta Blockers Failed - 07/24/2020  1:09 PM      Failed - Last BP in normal range    BP Readings from Last 1 Encounters:  01/23/20 (!) 142/84          Failed - Valid encounter within last 6 months    Recent Outpatient Visits           6 months ago Screening for diabetes mellitus   [place], Michelle P, NP   1 year ago [place], Olivia, NP   2 years ago Essential hypertension   [place], Roger David, PA-C   2 years ago Essential hypertension   [place], Roger David, PA-C   2 years ago Essential hypertension   [place] [name], PA-C       Future Appointments             In 6 days [name], NP [facility]             Passed - Last Heart Rate in normal range    Pulse Readings from Last 1 Encounters:  01/23/20 96

## 2020-07-25 ENCOUNTER — Ambulatory Visit (INDEPENDENT_AMBULATORY_CARE_PROVIDER_SITE_OTHER): Payer: Medicare Other | Admitting: Primary Care

## 2020-07-30 ENCOUNTER — Ambulatory Visit (INDEPENDENT_AMBULATORY_CARE_PROVIDER_SITE_OTHER): Payer: Medicare Other | Admitting: Primary Care

## 2020-08-14 ENCOUNTER — Ambulatory Visit (INDEPENDENT_AMBULATORY_CARE_PROVIDER_SITE_OTHER): Payer: Medicare Other | Admitting: Primary Care

## 2020-08-22 ENCOUNTER — Ambulatory Visit (INDEPENDENT_AMBULATORY_CARE_PROVIDER_SITE_OTHER): Payer: Medicare Other | Admitting: Primary Care

## 2020-08-22 ENCOUNTER — Other Ambulatory Visit: Payer: Self-pay

## 2020-08-22 DIAGNOSIS — I10 Essential (primary) hypertension: Secondary | ICD-10-CM

## 2020-08-22 NOTE — Progress Notes (Signed)
Pt has had covid vaccine unable to locate vaccine card

## 2020-08-22 NOTE — Progress Notes (Signed)
Virtual Visit via Telephone Note  I connected with Raymond Stone on 08/22/20 at  9:50 AM EST by telephone and verified that I am speaking with the correct person using two identifiers.  Location: Patient: home  Provider: Darran Gabay P [email or web address]    I discussed the limitations, risks, security and privacy concerns of performing an evaluation and management service by telephone and the availability of in person appointments. I also discussed with the patient that there may be a patient responsible charge related to this service. The patient expressed understanding and agreed to proceed. 125/85   History of Present Illness: Mr. Corran Stone is a 70 year old male following up for hypertension Denies shortness of breath, headaches, chest pain or lower extremity edema.  States he has a nurse that comes by and checks his blood pressure regularly however unable to give me a range of readings but today blood pressure reading was 125/85.  When I asked again about other readings the nurse takes the blood pressure again.  Blood pressure medication has been refilled he voices no other complaints problems or concerns   Past Medical History:  Diagnosis Date  . Anemia   . Eczema   . Fluid collection (edema) in the arms, legs, hands and feet   . Hypertension    Current Outpatient Medications on File Prior to Visit  Medication Sig Dispense Refill  . amLODipine (NORVASC) 10 MG tablet Take 1 tablet (10 mg total) by mouth daily. 90 tablet 1  . desonide (DESOWEN) 0.05 % ointment daily.    67 docusate sodium (COLACE) 100 MG capsule Take 100 mg by mouth 2 (two) times daily as needed for mild constipation.    . metoprolol tartrate (LOPRESSOR) 25 MG tablet TAKE 1 TABLET(25 MG) BY MOUTH THREE TIMES DAILY 270 tablet 0  . triamcinolone ointment (KENALOG) 0.1 % Apply 1 application topically daily after lunch. For eczema on all areas except face 30 g 0   No current facility-administered medications on file prior to  visit.   Observations/Objective: Vital signs not taken at this appointment pertinent positive and negative noted in HPI  Assessment and Plan: [name] was seen today for check up .  Diagnoses and all orders for this visit:  Essential hypertension Blood pressure reading at goal today not clear if patient was understanding the question about ranges of blood pressure no changes at this time.  Continue to monitor C low-sodium, DASH diet, medication compliance, 150 minutes of moderate intensity exercise per week. Discussed medication compliance, adverse effects.   Follow Up Instructions:    I discussed the assessment and treatment plan with the patient. The patient was provided an opportunity to ask questions and all were answered. The patient agreed with the plan and demonstrated an understanding of the instructions.   The patient was advised to call back or seek an in-person evaluation if the symptoms worsen or if the condition fails to improve as anticipated.  I provided  10 minutes of non-face-to-face time during this encounter.   Mikeal Hawthorne, NP

## 2020-09-09 ENCOUNTER — Other Ambulatory Visit (INDEPENDENT_AMBULATORY_CARE_PROVIDER_SITE_OTHER): Payer: Self-pay | Admitting: Primary Care

## 2020-09-09 DIAGNOSIS — L308 Other specified dermatitis: Secondary | ICD-10-CM

## 2020-09-09 MED ORDER — TRIAMCINOLONE ACETONIDE 0.1 % EX OINT: 1.0000 "application " | TOPICAL_OINTMENT | Freq: Every day | CUTANEOUS | 0 refills | Status: DC

## 2020-09-09 NOTE — Telephone Encounter (Signed)
Requested Prescriptions  Pending Prescriptions Disp Refills  . triamcinolone ointment (KENALOG) 0.1 % 30 g 0    Sig: Apply 1 application topically daily after lunch. For eczema on all areas except face     Dermatology:  Corticosteroids Passed - 09/09/2020  9:38 AM      Passed - Valid encounter within last 12 months    Recent Outpatient Visits          2 weeks ago Essential hypertension   [place], [place] P, NP   7 months ago Screening for diabetes mellitus   [place], [place] P, NP   1 year ago [place], [place], NP   2 years ago Essential hypertension   [place], Roger David, PA-C   2 years ago Essential hypertension   [place], Roger David, PA-C             . desonide (DESOWEN) 0.05 % ointment 15 g     Sig: daily.     Dermatology:  Corticosteroids Passed - 09/09/2020  9:38 AM      Passed - Valid encounter within last 12 months    Recent Outpatient Visits          2 weeks ago Essential hypertension   [place], [place], NP   7 months ago Screening for diabetes mellitus   [place], [place] P, NP   1 year ago [place], [place], NP   2 years ago Essential hypertension   [place], Roger David, PA-C   2 years ago Essential hypertension   [place], Roger David, PA-C

## 2020-09-09 NOTE — Telephone Encounter (Signed)
Requested medication (s) are due for refill today: -  Requested medication (s) are on the active medication list: historical med  Last refill:  12/15/18  Future visit scheduled: no  Notes to clinic:  historical med and provider   Requested Prescriptions  Pending Prescriptions Disp Refills   desonide (DESOWEN) 0.05 % ointment 15 g     Sig: daily.      Dermatology:  Corticosteroids Passed - 09/09/2020  9:38 AM      Passed - Valid encounter within last 12 months    Recent Outpatient Visits           2 weeks ago Essential hypertension   [place], [place] P, NP   7 months ago Screening for diabetes mellitus   [place], [place] P, NP   1 year ago [place], [place], NP   2 years ago Essential hypertension   [place], Roger David, PA-C   2 years ago Essential hypertension   [place] [name], PA-C                 Signed Prescriptions Disp Refills   triamcinolone ointment (KENALOG) 0.1 % 30 g 0    Sig: Apply 1 application topically daily after lunch. For eczema on all areas except face      Dermatology:  Corticosteroids Passed - 09/09/2020  9:38 AM      Passed - Valid encounter within last 12 months    Recent Outpatient Visits           2 weeks ago Essential hypertension   [place], [place] P, NP   7 months ago Screening for diabetes mellitus   [place], Michelle P, NP   1 year ago [place], [place], NP   2 years ago Essential hypertension   [place], Roger David, PA-C   2 years ago Essential hypertension   [place], Roger David, PA-C

## 2020-09-09 NOTE — Telephone Encounter (Signed)
Medication: triamcinolone ointment (KENALOG) 0.1 % [[id number]] , desonide (DESOWEN) 0.05 % ointment [[id number]]   Has the patient contacted their pharmacy? YES  (Agent: If no, request that the patient contact the pharmacy for the refill.) (Agent: If yes, when and what did the pharmacy advise?)  Preferred Pharmacy (with phone number or street name): [facility] [phone] - [place], [place] - [place] AT [address] [place] [address] Phone: [phone] Fax: [phone] Hours: Not open 24 hours    Agent: Please be advised that RX refills may take up to 3 business days. We ask that you follow-up with your pharmacy.

## 2020-09-10 NOTE — Telephone Encounter (Signed)
Pt is calling checking on status of medications

## 2020-09-12 MED ORDER — DESONIDE 0.05 % EX OINT
TOPICAL_OINTMENT | Freq: Two times a day (BID) | CUTANEOUS | 1 refills | Status: DC
Start: 2020-09-12 — End: 2020-11-07

## 2020-10-12 ENCOUNTER — Emergency Department (HOSPITAL_COMMUNITY): Payer: Medicare Other

## 2020-10-12 ENCOUNTER — Inpatient Hospital Stay (HOSPITAL_COMMUNITY)
Admission: EM | Admit: 2020-10-12 | Discharge: 2020-11-07 | DRG: 682 | Disposition: A | Discharge status: Hospice | Payer: Medicare Other | Attending: Family Medicine | Admitting: Family Medicine

## 2020-10-12 ENCOUNTER — Inpatient Hospital Stay (HOSPITAL_COMMUNITY): Payer: Medicare Other

## 2020-10-12 DIAGNOSIS — R34 Anuria and oliguria: Secondary | ICD-10-CM | POA: Diagnosis present

## 2020-10-12 DIAGNOSIS — G9349 Other encephalopathy: Secondary | ICD-10-CM | POA: Diagnosis not present

## 2020-10-12 DIAGNOSIS — M25462 Effusion, left knee: Secondary | ICD-10-CM | POA: Diagnosis not present

## 2020-10-12 DIAGNOSIS — Z66 Do not resuscitate: Secondary | ICD-10-CM | POA: Diagnosis not present

## 2020-10-12 DIAGNOSIS — F1020 Alcohol dependence, uncomplicated: Secondary | ICD-10-CM | POA: Diagnosis present

## 2020-10-12 DIAGNOSIS — E860 Dehydration: Secondary | ICD-10-CM | POA: Diagnosis not present

## 2020-10-12 DIAGNOSIS — Z888 Allergy status to other drugs, medicaments and biological substances status: Secondary | ICD-10-CM

## 2020-10-12 DIAGNOSIS — Z7401 Bed confinement status: Secondary | ICD-10-CM

## 2020-10-12 DIAGNOSIS — L89896 Pressure-induced deep tissue damage of other site: Secondary | ICD-10-CM | POA: Diagnosis present

## 2020-10-12 DIAGNOSIS — G9341 Metabolic encephalopathy: Secondary | ICD-10-CM

## 2020-10-12 DIAGNOSIS — E870 Hyperosmolality and hypernatremia: Secondary | ICD-10-CM | POA: Diagnosis present

## 2020-10-12 DIAGNOSIS — G319 Degenerative disease of nervous system, unspecified: Secondary | ICD-10-CM | POA: Diagnosis not present

## 2020-10-12 DIAGNOSIS — R5381 Other malaise: Secondary | ICD-10-CM | POA: Diagnosis present

## 2020-10-12 DIAGNOSIS — M2550 Pain in unspecified joint: Secondary | ICD-10-CM

## 2020-10-12 DIAGNOSIS — R1310 Dysphagia, unspecified: Secondary | ICD-10-CM | POA: Diagnosis present

## 2020-10-12 DIAGNOSIS — R571 Hypovolemic shock: Secondary | ICD-10-CM | POA: Diagnosis present

## 2020-10-12 DIAGNOSIS — L89300 Pressure ulcer of unspecified buttock, unstageable: Secondary | ICD-10-CM | POA: Diagnosis not present

## 2020-10-12 DIAGNOSIS — E038 Other specified hypothyroidism: Secondary | ICD-10-CM | POA: Diagnosis present

## 2020-10-12 DIAGNOSIS — R6510 Systemic inflammatory response syndrome (SIRS) of non-infectious origin without acute organ dysfunction: Secondary | ICD-10-CM | POA: Diagnosis not present

## 2020-10-12 DIAGNOSIS — M6282 Rhabdomyolysis: Secondary | ICD-10-CM | POA: Diagnosis present

## 2020-10-12 DIAGNOSIS — Z20822 Contact with and (suspected) exposure to covid-19: Secondary | ICD-10-CM | POA: Diagnosis present

## 2020-10-12 DIAGNOSIS — Z5329 Procedure and treatment not carried out because of patient's decision for other reasons: Secondary | ICD-10-CM | POA: Diagnosis not present

## 2020-10-12 DIAGNOSIS — R451 Restlessness and agitation: Secondary | ICD-10-CM | POA: Diagnosis not present

## 2020-10-12 DIAGNOSIS — N179 Acute kidney failure, unspecified: Principal | ICD-10-CM

## 2020-10-12 DIAGNOSIS — I10 Essential (primary) hypertension: Secondary | ICD-10-CM | POA: Diagnosis present

## 2020-10-12 DIAGNOSIS — J690 Pneumonitis due to inhalation of food and vomit: Secondary | ICD-10-CM | POA: Diagnosis present

## 2020-10-12 DIAGNOSIS — E440 Moderate protein-calorie malnutrition: Secondary | ICD-10-CM | POA: Diagnosis present

## 2020-10-12 DIAGNOSIS — R630 Anorexia: Secondary | ICD-10-CM | POA: Diagnosis not present

## 2020-10-12 DIAGNOSIS — I110 Hypertensive heart disease with heart failure: Secondary | ICD-10-CM | POA: Diagnosis present

## 2020-10-12 DIAGNOSIS — T17908S Unspecified foreign body in respiratory tract, part unspecified causing other injury, sequela: Secondary | ICD-10-CM | POA: Diagnosis not present

## 2020-10-12 DIAGNOSIS — I5032 Chronic diastolic (congestive) heart failure: Secondary | ICD-10-CM | POA: Diagnosis present

## 2020-10-12 DIAGNOSIS — E872 Acidosis: Secondary | ICD-10-CM | POA: Diagnosis present

## 2020-10-12 DIAGNOSIS — I214 Non-ST elevation (NSTEMI) myocardial infarction: Secondary | ICD-10-CM | POA: Diagnosis present

## 2020-10-12 DIAGNOSIS — N170 Acute kidney failure with tubular necrosis: Secondary | ICD-10-CM | POA: Diagnosis not present

## 2020-10-12 DIAGNOSIS — Z452 Encounter for adjustment and management of vascular access device: Secondary | ICD-10-CM

## 2020-10-12 DIAGNOSIS — R627 Adult failure to thrive: Secondary | ICD-10-CM | POA: Diagnosis present

## 2020-10-12 DIAGNOSIS — W19XXXA Unspecified fall, initial encounter: Secondary | ICD-10-CM | POA: Diagnosis present

## 2020-10-12 DIAGNOSIS — E875 Hyperkalemia: Secondary | ICD-10-CM | POA: Diagnosis not present

## 2020-10-12 DIAGNOSIS — Z7189 Other specified counseling: Secondary | ICD-10-CM | POA: Diagnosis not present

## 2020-10-12 DIAGNOSIS — N19 Unspecified kidney failure: Secondary | ICD-10-CM | POA: Diagnosis not present

## 2020-10-12 DIAGNOSIS — Z0189 Encounter for other specified special examinations: Secondary | ICD-10-CM

## 2020-10-12 DIAGNOSIS — M109 Gout, unspecified: Secondary | ICD-10-CM | POA: Diagnosis present

## 2020-10-12 DIAGNOSIS — L89026 Pressure-induced deep tissue damage of left elbow: Secondary | ICD-10-CM | POA: Diagnosis present

## 2020-10-12 DIAGNOSIS — E876 Hypokalemia: Secondary | ICD-10-CM | POA: Diagnosis not present

## 2020-10-12 DIAGNOSIS — A419 Sepsis, unspecified organism: Secondary | ICD-10-CM

## 2020-10-12 DIAGNOSIS — Z515 Encounter for palliative care: Secondary | ICD-10-CM | POA: Diagnosis not present

## 2020-10-12 DIAGNOSIS — L89016 Pressure-induced deep tissue damage of right elbow: Secondary | ICD-10-CM | POA: Diagnosis present

## 2020-10-12 DIAGNOSIS — L8990 Pressure ulcer of unspecified site, unspecified stage: Secondary | ICD-10-CM | POA: Insufficient documentation

## 2020-10-12 DIAGNOSIS — R579 Shock, unspecified: Secondary | ICD-10-CM | POA: Diagnosis not present

## 2020-10-12 DIAGNOSIS — R6521 Severe sepsis with septic shock: Secondary | ICD-10-CM | POA: Diagnosis not present

## 2020-10-12 DIAGNOSIS — Z8739 Personal history of other diseases of the musculoskeletal system and connective tissue: Secondary | ICD-10-CM

## 2020-10-12 DIAGNOSIS — Z79899 Other long term (current) drug therapy: Secondary | ICD-10-CM

## 2020-10-12 DIAGNOSIS — R7989 Other specified abnormal findings of blood chemistry: Secondary | ICD-10-CM | POA: Diagnosis not present

## 2020-10-12 DIAGNOSIS — Z781 Physical restraint status: Secondary | ICD-10-CM

## 2020-10-12 DIAGNOSIS — Z789 Other specified health status: Secondary | ICD-10-CM

## 2020-10-12 DIAGNOSIS — R509 Fever, unspecified: Secondary | ICD-10-CM | POA: Diagnosis not present

## 2020-10-12 DIAGNOSIS — L89610 Pressure ulcer of right heel, unstageable: Secondary | ICD-10-CM | POA: Diagnosis present

## 2020-10-12 DIAGNOSIS — L309 Dermatitis, unspecified: Secondary | ICD-10-CM | POA: Diagnosis present

## 2020-10-12 DIAGNOSIS — E43 Unspecified severe protein-calorie malnutrition: Secondary | ICD-10-CM | POA: Diagnosis not present

## 2020-10-12 DIAGNOSIS — J9601 Acute respiratory failure with hypoxia: Secondary | ICD-10-CM | POA: Diagnosis present

## 2020-10-12 DIAGNOSIS — Z634 Disappearance and death of family member: Secondary | ICD-10-CM

## 2020-10-12 DIAGNOSIS — E878 Other disorders of electrolyte and fluid balance, not elsewhere classified: Secondary | ICD-10-CM | POA: Diagnosis not present

## 2020-10-12 DIAGNOSIS — F32A Depression, unspecified: Secondary | ICD-10-CM | POA: Diagnosis present

## 2020-10-12 LAB — CBC WITH DIFFERENTIAL/PLATELET
Abs Immature Granulocytes: 0.56 10*3/uL — ABNORMAL HIGH (ref 0.00–0.07)
Basophils Absolute: 0 10*3/uL (ref 0.0–0.1)
Basophils Relative: 0 %
Eosinophils Absolute: 0 10*3/uL (ref 0.0–0.5)
Eosinophils Relative: 0 %
HCT: 49.9 % (ref 39.0–52.0)
Hemoglobin: 15 g/dL (ref 13.0–17.0)
Immature Granulocytes: 3 %
Lymphocytes Relative: 10 %
Lymphs Abs: 1.8 10*3/uL (ref 0.7–4.0)
MCH: 33.6 pg (ref 26.0–34.0)
MCHC: 30.1 g/dL (ref 30.0–36.0)
MCV: 111.6 fL — ABNORMAL HIGH (ref 80.0–100.0)
Monocytes Absolute: 1.2 10*3/uL — ABNORMAL HIGH (ref 0.1–1.0)
Monocytes Relative: 7 %
Neutro Abs: 14.8 10*3/uL — ABNORMAL HIGH (ref 1.7–7.7)
Neutrophils Relative %: 80 %
Platelets: 523 10*3/uL — ABNORMAL HIGH (ref 150–400)
RBC: 4.47 MIL/uL (ref 4.22–5.81)
RDW: 15.3 % (ref 11.5–15.5)
WBC: 18.4 10*3/uL — ABNORMAL HIGH (ref 4.0–10.5)
nRBC: 0.3 % — ABNORMAL HIGH (ref 0.0–0.2)

## 2020-10-12 LAB — URINALYSIS, ROUTINE W REFLEX MICROSCOPIC
Bacteria, UA: NONE SEEN
Glucose, UA: 50 mg/dL — AB
Hgb urine dipstick: NEGATIVE
Ketones, ur: NEGATIVE mg/dL
Nitrite: NEGATIVE
Protein, ur: NEGATIVE mg/dL
Specific Gravity, Urine: 1.026 (ref 1.005–1.030)
pH: 5 (ref 5.0–8.0)

## 2020-10-12 LAB — COMPREHENSIVE METABOLIC PANEL
ALT: 42 U/L (ref 0–44)
AST: 118 U/L — ABNORMAL HIGH (ref 15–41)
Albumin: 2.2 g/dL — ABNORMAL LOW (ref 3.5–5.0)
Alkaline Phosphatase: 61 U/L (ref 38–126)
BUN: 211 mg/dL — ABNORMAL HIGH (ref 8–23)
CO2: 15 mmol/L — ABNORMAL LOW (ref 22–32)
Calcium: 10.9 mg/dL — ABNORMAL HIGH (ref 8.9–10.3)
Chloride: >130 mmol/L (ref 98–111)
Creatinine, Ser: 11.25 mg/dL — ABNORMAL HIGH (ref 0.61–1.24)
GFR, Estimated: 4 mL/min — ABNORMAL LOW (ref >60)
Glucose, Bld: 156 mg/dL — ABNORMAL HIGH (ref 70–99)
Potassium: 7.3 mmol/L (ref 3.5–5.1)
Sodium: 172 mmol/L (ref 135–145)
Total Bilirubin: 1.5 mg/dL — ABNORMAL HIGH (ref 0.3–1.2)
Total Protein: 9.1 g/dL — ABNORMAL HIGH (ref 6.5–8.1)

## 2020-10-12 LAB — I-STAT ARTERIAL BLOOD GAS, ED
Acid-base deficit: 9 mmol/L — ABNORMAL HIGH (ref 0.0–2.0)
Bicarbonate: 15 mmol/L — ABNORMAL LOW (ref 20.0–28.0)
Calcium, Ion: 1.33 mmol/L (ref 1.15–1.40)
HCT: 33 % — ABNORMAL LOW (ref 39.0–52.0)
Hemoglobin: 11.2 g/dL — ABNORMAL LOW (ref 13.0–17.0)
O2 Saturation: 97 %
Patient temperature: 98.6
Potassium: 6 mmol/L — ABNORMAL HIGH (ref 3.5–5.1)
Sodium: 173 mmol/L (ref 135–145)
TCO2: 16 mmol/L — ABNORMAL LOW (ref 22–32)
pCO2 arterial: 26 mmHg — ABNORMAL LOW (ref 32.0–48.0)
pH, Arterial: 7.37 (ref 7.350–7.450)
pO2, Arterial: 96 mmHg (ref 83.0–108.0)

## 2020-10-12 LAB — AMMONIA: Ammonia: 46 umol/L — ABNORMAL HIGH (ref 9–35)

## 2020-10-12 LAB — SALICYLATE LEVEL: Salicylate Lvl: <7 mg/dL — ABNORMAL LOW (ref 7.0–30.0)

## 2020-10-12 LAB — BASIC METABOLIC PANEL
BUN: 198 mg/dL — ABNORMAL HIGH (ref 8–23)
CO2: 19 mmol/L — ABNORMAL LOW (ref 22–32)
Calcium: 9.7 mg/dL (ref 8.9–10.3)
Chloride: >130 mmol/L (ref 98–111)
Creatinine, Ser: 10.19 mg/dL — ABNORMAL HIGH (ref 0.61–1.24)
GFR, Estimated: 5 mL/min — ABNORMAL LOW (ref >60)
Glucose, Bld: 170 mg/dL — ABNORMAL HIGH (ref 70–99)
Potassium: 5.1 mmol/L (ref 3.5–5.1)
Sodium: 171 mmol/L (ref 135–145)

## 2020-10-12 LAB — PROTIME-INR
INR: 1.6 — ABNORMAL HIGH (ref 0.8–1.2)
Prothrombin Time: 18.1 seconds — ABNORMAL HIGH (ref 11.4–15.2)

## 2020-10-12 LAB — RAPID URINE DRUG SCREEN, HOSP PERFORMED
Amphetamines: NOT DETECTED
Barbiturates: NOT DETECTED
Benzodiazepines: NOT DETECTED
Cocaine: NOT DETECTED
Opiates: NOT DETECTED
Tetrahydrocannabinol: NOT DETECTED

## 2020-10-12 LAB — RAPID HIV SCREEN (HIV 1/2 AB+AG)
HIV 1/2 Antibodies: NONREACTIVE
HIV-1 P24 Antigen - HIV24: NONREACTIVE

## 2020-10-12 LAB — TROPONIN I (HIGH SENSITIVITY)
Troponin I (High Sensitivity): 489 ng/L (ref <18)
Troponin I (High Sensitivity): 395 ng/L (ref <18)

## 2020-10-12 LAB — ETHANOL: Alcohol, Ethyl (B): <10 mg/dL (ref <10)

## 2020-10-12 LAB — LACTIC ACID, PLASMA: Lactic Acid, Venous: 7.8 mmol/L (ref 0.5–1.9)

## 2020-10-12 LAB — ACETAMINOPHEN LEVEL: Acetaminophen (Tylenol), Serum: <10 ug/mL — ABNORMAL LOW (ref 10–30)

## 2020-10-12 LAB — CK: Total CK: 2442 U/L — ABNORMAL HIGH (ref 49–397)

## 2020-10-12 MED ORDER — LACTATED RINGERS IV BOLUS: 1000.0000 mL | Freq: Once | INTRAVENOUS | Status: DC

## 2020-10-12 MED ORDER — INSULIN ASPART 100 UNIT/ML ~~LOC~~ SOLN
10.0000 [IU] | Freq: Once | SUBCUTANEOUS | Status: AC
  Administered 2020-10-12: 10 [IU] via INTRAVENOUS

## 2020-10-12 MED ORDER — DOCUSATE SODIUM 100 MG PO CAPS: 100.0000 mg | ORAL_CAPSULE | Freq: Two times a day (BID) | ORAL | Status: DC | PRN

## 2020-10-12 MED ORDER — PIPERACILLIN-TAZOBACTAM IN DEX 2-0.25 GM/50ML IV SOLN
2.2500 g | Freq: Three times a day (TID) | INTRAVENOUS | Status: DC
  Administered 2020-10-13: 2.25 g via INTRAVENOUS
  Filled 2020-10-12 (×2): qty 50

## 2020-10-12 MED ORDER — HEPARIN SODIUM (PORCINE) 5000 UNIT/ML IJ SOLN
5000.0000 [IU] | Freq: Three times a day (TID) | INTRAMUSCULAR | Status: DC
  Administered 2020-10-13 – 2020-10-17 (×13): 5000 [IU] via SUBCUTANEOUS
  Filled 2020-10-12 (×13): qty 1

## 2020-10-12 MED ORDER — VANCOMYCIN VARIABLE DOSE PER UNSTABLE RENAL FUNCTION (PHARMACIST DOSING): Status: DC

## 2020-10-12 MED ORDER — PIPERACILLIN-TAZOBACTAM 3.375 G IVPB 30 MIN
3.3750 g | Freq: Once | INTRAVENOUS | Status: AC
  Administered 2020-10-12: 3.375 g via INTRAVENOUS
  Filled 2020-10-12: qty 50

## 2020-10-12 MED ORDER — SODIUM BICARBONATE 8.4 % IV SOLN
50.0000 meq | Freq: Once | INTRAVENOUS | Status: AC
  Administered 2020-10-12: 50 meq via INTRAVENOUS
  Filled 2020-10-12: qty 50

## 2020-10-12 MED ORDER — VANCOMYCIN HCL 1750 MG/350ML IV SOLN
1750.0000 mg | Freq: Once | INTRAVENOUS | Status: AC
  Administered 2020-10-12: 1750 mg via INTRAVENOUS
  Filled 2020-10-12: qty 350

## 2020-10-12 MED ORDER — SODIUM CHLORIDE 0.9 % IV BOLUS
1000.0000 mL | Freq: Once | INTRAVENOUS | Status: AC
  Administered 2020-10-12: 1000 mL via INTRAVENOUS

## 2020-10-12 MED ORDER — SODIUM CHLORIDE 0.9 % IV BOLUS
2700.0000 mL | Freq: Once | INTRAVENOUS | Status: AC
  Administered 2020-10-12: 2700 mL via INTRAVENOUS

## 2020-10-12 MED ORDER — SODIUM ZIRCONIUM CYCLOSILICATE 10 G PO PACK
10.0000 g | PACK | Freq: Two times a day (BID) | ORAL | Status: DC
  Filled 2020-10-12: qty 1

## 2020-10-12 MED ORDER — CALCIUM GLUCONATE 10 % IV SOLN
1.0000 g | Freq: Once | INTRAVENOUS | Status: AC
  Administered 2020-10-12: 1 g via INTRAVENOUS
  Filled 2020-10-12: qty 10

## 2020-10-12 MED ORDER — VANCOMYCIN HCL 10 G IV SOLR
1750.0000 mg | Freq: Once | INTRAVENOUS | Status: DC
  Filled 2020-10-12: qty 1750

## 2020-10-12 MED ORDER — SODIUM CHLORIDE 0.9 % IV SOLN: INTRAVENOUS | Status: DC

## 2020-10-12 MED ORDER — ONDANSETRON HCL 4 MG/2ML IJ SOLN: 4.0000 mg | Freq: Four times a day (QID) | INTRAMUSCULAR | Status: DC | PRN

## 2020-10-12 MED ORDER — POLYETHYLENE GLYCOL 3350 17 G PO PACK: 17.0000 g | PACK | Freq: Every day | ORAL | Status: DC | PRN

## 2020-10-12 MED ORDER — PANTOPRAZOLE SODIUM 40 MG IV SOLR
40.0000 mg | Freq: Every day | INTRAVENOUS | Status: DC
  Administered 2020-10-12 – 2020-10-13 (×2): 40 mg via INTRAVENOUS
  Filled 2020-10-12 (×2): qty 40

## 2020-10-12 MED ORDER — DEXTROSE 50 % IV SOLN
1.0000 | Freq: Once | INTRAVENOUS | Status: AC
  Administered 2020-10-12: 50 mL via INTRAVENOUS
  Filled 2020-10-12: qty 50

## 2020-10-12 NOTE — Progress Notes (Signed)
Pharmacy Antibiotic Note  Mattix Imhof is a 70 y.o. male admitted on 10/12/2020 with sepsis.  Pharmacy has been consulted for vancomycin and zosyn dosing.  Plan: Vancomycin 1750 mg IV x 1, then variable dosing due to unstable renal function Zosyn 3.375g IV x 1, then 2.25 g IV every 8 hours Monitor renal function, Cx and clinical progression to narrow Vancomycin levels as needed     Temp (24hrs), Avg:100 F (37.8 C), Min:100 F (37.8 C), Max:100 F (37.8 C)  Recent Labs  Lab 10/12/20 1740  WBC 18.4*  LATICACIDVEN 7.8*    CrCl cannot be calculated (Patient's most recent lab result is older than the maximum 21 days allowed.).    Allergies  Allergen Reactions  . Lisinopril Swelling    ANGIOEDEMA    Bertis Ruddy, PharmD Clinical Pharmacist ED Pharmacist Phone # [phone] 10/12/2020 7:12 PM

## 2020-10-12 NOTE — ED Notes (Addendum)
RT unable to get ABGs, RN trying to get a VBG now.

## 2020-10-12 NOTE — Sepsis Progress Note (Signed)
[facility] is following the sepsis protocol. 

## 2020-10-12 NOTE — Progress Notes (Signed)
[facility] Code Sepsis Follow-up Note:  20:27 LA collected, has not resulted yet. Awaiting results, pt had BC drawn, received ABX, and had IVFbolus administered. Will continue to monitor for LA results, was 7.8 earlier.   Ky Moskowitz DNP [place] [employer] RN

## 2020-10-12 NOTE — ED Notes (Signed)
Patient transported to CT 

## 2020-10-12 NOTE — ED Triage Notes (Signed)
BIB EMS from his apartment. Pt has not been seen since Monday so the apartment manager checked on him and was found on the floor. He was lying on his right side in the corner hallway of his house. Pt res\ponded only when IO was placed. EMS unable to get blood pressure. Pt RR 50-70, 130 NST, 16 capnography.

## 2020-10-12 NOTE — ED Provider Notes (Signed)
[facility] EMERGENCY DEPARTMENT Provider Note   CSN: [id number] Arrival date & time: 10/12/20  1730     History Chief Complaint  Patient presents with  . Fall  . Altered Mental Status    Raymond Stone is a 70 y.o. male.  He lives alone.  He was last 6 days ago.  He was found down today.  He is unable to provide any history.  The history is provided by the EMS personnel. The history is limited by the condition of the patient.  Altered Mental Status Presenting symptoms: partial responsiveness   Presenting symptoms comment:  Found down. Last seen in his apartment one week ago. Severity:  Severe Most recent episode: unknown. Episode number: unknown. Duration: unknown. Timing:  Constant Progression:  Unchanged Chronicity:  New Context: not dementia        Past Medical History:  Diagnosis Date  . Anemia   . Eczema   . Fluid collection (edema) in the arms, legs, hands and feet   . Hypertension     Patient Active Problem List   Diagnosis Date Noted  . Acute respiratory failure with hypoxia ([facility]) 11/24/2018  . Angioedema 05/22/2017  . 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 Endotracheally intubated   . History of alcohol dependence ([place]) 05/17/2017  . Leukocytosis 01/27/2017  . AKI (acute kidney injury) ([place]) 01/24/2017  . 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 Alcohol dependence ([place]) 01/24/2017  . Normocytic anemia 01/24/2017  . Thrombocytosis 01/24/2017  . Chronic diastolic CHF (congestive heart failure) ([place]) 01/24/2017  . Scrotal edema 10/20/2016  . Nephrotic syndrome 10/20/2016  . Essential hypertension 01/07/2016  . Severe eczema 04/16/2014    Past Surgical History:  Procedure Laterality Date  . COLONOSCOPY    . FINGER SURGERY Right 2013  . I & D EXTREMITY Right 09/13/2012   Procedure: IRRIGATION AND DEBRIDEMENT EXTREMITY  RIGHT MIDDLE FINGER WITH REVISION AMPUTATION AND SKIN GRAFTING.;  Surgeon:  Roseanne Kaufman, MD;  Location: [facility];  Service: Orthopedics;  Laterality: Right;       Family History  Problem Relation Age of Onset  . Lupus Daughter   . Stomach cancer Brother   . Colon cancer Neg Hx   . Colon polyps Neg Hx   . Esophageal cancer Neg Hx   . Rectal cancer Neg Hx     Social History   Tobacco Use  . Smoking status: Never Smoker  . Smokeless tobacco: Never Used  Vaping Use  . Vaping Use: Never used  Substance Use Topics  . Alcohol use: Yes    Alcohol/week: 3.0 - 4.0 standard drinks    Types: 3 - 4 Cans of beer per week    Comment: beer everyday  . Drug use: No    Home Medications Prior to Admission medications   Medication Sig Start Date End Date Taking? Authorizing Provider  amLODipine (NORVASC) 10 MG tablet Take 1 tablet (10 mg total) by mouth daily. 07/24/20   [name], NP  desonide (DESOWEN) 0.05 % ointment Apply topically 2 (two) times daily. 09/12/20   [name], NP  docusate sodium (COLACE) 100 MG capsule Take 100 mg by mouth 2 (two) times daily as needed for mild constipation.    [provider]  metoprolol tartrate (LOPRESSOR) 25 MG tablet TAKE 1 TABLET(25 MG) BY MOUTH THREE TIMES DAILY 07/26/20   [name], NP  triamcinolone ointment (KENALOG) 0.1 % Apply 1 application topically daily after lunch.  For eczema on all areas except face 09/09/20   [name], NP    Allergies    Lisinopril  Review of Systems   Review of Systems  Unable to perform ROS: Acuity of condition    Physical Exam Updated Vital Signs BP 111/87   Pulse (!) 125   Resp (!) 30   SpO2 100%   Physical Exam Constitutional:      Comments: The patient is in severe distress.  He is minimally responsive but appears to be agitated  HENT:     Head: Normocephalic and atraumatic.     Nose: Nose normal.     Mouth/Throat:     Mouth: Mucous membranes are dry.  Eyes:     General:        Right eye: No discharge.        Left eye: No  discharge.     Comments: Cataract in the right eye.  Left pupil is normal in size and reactive  Cardiovascular:     Rate and Rhythm: Regular rhythm. Tachycardia present.     Comments: He has a small, superficial skin tear at the left lateral chest  Pulmonary:     Effort: Respiratory distress present.     Breath sounds: Normal breath sounds.     Comments: Tachypneic Abdominal:     General: Abdomen is flat. There is no distension.     Tenderness: There is no abdominal tenderness.  Genitourinary:    Comments: Minimal, shallow ulcer at his sacrum Musculoskeletal:        General: No deformity.     Cervical back: Normal range of motion.  Skin:    Capillary Refill: Capillary refill takes more than 3 seconds.     Comments: Skin is incredibly dry.  It is tented and has almost no turgor  Neurological:     Mental Status: He is confused.     GCS: GCS eye subscore is 1. GCS verbal subscore is 3. GCS motor subscore is 6.     Comments: He appears to be moving all extremities and has some mild tremor in both upper extremities     ED Results / Procedures / Treatments   Labs (all labs ordered are listed, but only abnormal results are displayed) Labs Reviewed  CBC WITH DIFFERENTIAL/PLATELET - Abnormal; Notable for the following components:      Result Value   WBC 18.4 (*)    MCV 111.6 (*)    Platelets 523 (*)    nRBC 0.3 (*)    Neutro Abs 14.8 (*)    Monocytes Absolute 1.2 (*)    Abs Immature Granulocytes 0.56 (*)    All other components within normal limits  URINALYSIS, ROUTINE W REFLEX MICROSCOPIC - Abnormal; Notable for the following components:   Color, Urine AMBER (*)    APPearance HAZY (*)    Glucose, UA 50 (*)    Bilirubin Urine SMALL (*)    Leukocytes,Ua SMALL (*)    All other components within normal limits  AMMONIA - Abnormal; Notable for the following components:   Ammonia 46 (*)    All other components within normal limits  LACTIC ACID, PLASMA - Abnormal; Notable for the  following components:   Lactic Acid, Venous 7.8 (*)    All other components within normal limits  PROTIME-INR - Abnormal; Notable for the following components:   Prothrombin Time 18.1 (*)    INR 1.6 (*)    All other components within normal limits  ACETAMINOPHEN  LEVEL - Abnormal; Notable for the following components:   Acetaminophen (Tylenol), Serum <10 (*)    All other components within normal limits  SALICYLATE LEVEL - Abnormal; Notable for the following components:   Salicylate Lvl <9.9 (*)    All other components within normal limits  CK - Abnormal; Notable for the following components:   Total CK 2,442 (*)    All other components within normal limits  COMPREHENSIVE METABOLIC PANEL - Abnormal; Notable for the following components:   Sodium 172 (*)    Potassium 7.3 (*)    Chloride >130 (*)    CO2 15 (*)    Glucose, Bld 156 (*)    BUN 211 (*)    Creatinine, Ser 11.25 (*)    Calcium 10.9 (*)    Total Protein 9.1 (*)    Albumin 2.2 (*)    AST 118 (*)    Total Bilirubin 1.5 (*)    GFR, Estimated 4 (*)    All other components within normal limits  TROPONIN I (HIGH SENSITIVITY) - Abnormal; Notable for the following components:   Troponin I (High Sensitivity) 489 (*)    All other components within normal limits  CULTURE, BLOOD (ROUTINE X 2)  CULTURE, BLOOD (ROUTINE X 2)  RAPID URINE DRUG SCREEN, HOSP PERFORMED  ETHANOL  RAPID HIV SCREEN (HIV 1/2 AB+AG)  BLOOD GAS, ARTERIAL  HEPATITIS B SURFACE ANTIGEN  HEPATITIS PANEL, ACUTE  LACTIC ACID, PLASMA  BLOOD GAS, ARTERIAL  CBG MONITORING, ED  POC SARS CORONAVIRUS 2 AG -  ED  TROPONIN I (HIGH SENSITIVITY)    EKG EKG Interpretation  Date/Time:  Saturday October 12 2020 17:51:08 EDT Ventricular Rate:  119 PR Interval:  113 QRS Duration: 70 QT Interval:  319 QTC Calculation: 449 R Axis:   60 Text Interpretation: Sinus tachycardia normal axis no acute ischemia Confirmed by Lorre Munroe ([id number]) on 10/12/2020 8:01:17  PM   Radiology CT Head Wo Contrast  Result Date: 10/12/2020 CLINICAL DATA:  Mental status change EXAM: CT HEAD WITHOUT CONTRAST TECHNIQUE: Contiguous axial images were obtained from the base of the skull through the vertex without intravenous contrast. COMPARISON:  04/20/2013 FINDINGS: Brain: No acute intracranial hemorrhage. No focal mass lesion. No CT evidence of acute infarction. No midline shift or mass effect. No hydrocephalus. Basilar cisterns are patent. There are periventricular and subcortical white matter hypodensities. Generalized cortical atrophy. Vascular: No hyperdense vessel or unexpected calcification. Skull: Normal. Negative for fracture or focal lesion. Sinuses/Orbits: Paranasal sinuses and mastoid air cells are clear. Orbits are clear. Other: None. IMPRESSION: 1. No acute intracranial findings. 2. Progressive atrophy and white matter microvascular disease compared to CT exam 2014. Electronically Signed   By: Suzy Bouchard M.D.   On: 10/12/2020 18:55   DG Chest Port 1 View  Result Date: 10/12/2020 CLINICAL DATA:  Fever, altered mental status EXAM: PORTABLE CHEST 1 VIEW COMPARISON:  None. FINDINGS: Normal mediastinum and cardiac silhouette. Normal pulmonary vasculature. No evidence of effusion, infiltrate, or pneumothorax. No acute bony abnormality. IMPRESSION: No acute cardiopulmonary process. Electronically Signed   By: Suzy Bouchard M.D.   On: 10/12/2020 19:00    Procedures .Critical Care Performed by: Arnaldo Natal, MD Authorized by: Arnaldo Natal, MD   Critical care provider statement:    Critical care time (minutes):  45   Critical care was time spent personally by me on the following activities:  Discussions with consultants, evaluation of patient's response to treatment, examination of patient, ordering and performing  treatments and interventions, ordering and review of laboratory studies, ordering and review of radiographic studies, pulse oximetry, re-evaluation of  patient's condition, obtaining history from patient or surrogate and review of old charts     Medications Ordered in ED Medications  piperacillin-tazobactam (ZOSYN) IVPB 3.375 g (3.375 g Intravenous New Bag/Given 10/12/20 2011)  vancomycin (VANCOREADY) IVPB 1750 mg/350 mL (has no administration in time range)  vancomycin variable dose per unstable renal function (pharmacist dosing) (has no administration in time range)  piperacillin-tazobactam (ZOSYN) IVPB 2.25 g (has no administration in time range)  0.9 %  sodium chloride infusion (has no administration in time range)  calcium gluconate inj 10% (1 g) URGENT USE ONLY! (1 g Intravenous Given 10/12/20 2022)  sodium chloride 0.9 % bolus 2,700 mL (2,700 mLs Intravenous New Bag/Given 10/12/20 1741)  sodium bicarbonate injection 50 mEq (50 mEq Intravenous Given 10/12/20 2021)    ED Course  I have reviewed the triage vital signs and the nursing notes.  Pertinent labs & imaging results that were available during my care of the patient were reviewed by me and considered in my medical decision making (see chart for details).  Clinical Course as of 10/12/20 2023  Sat Oct 12, 2020  1904 I spoke to the patient's sister. She is out of town. His brother-in-law states that they last shouted at him through the windows, and he said he was OK. Their last contact with him was 3 days ago. He told them that he had been lying in the bed for awhile.  [AW]  1922 He is more alert and is able to nod yes and no  [AW]  2019 I spoke with Dr. Johnney Ou with Nephrology and also the intensivist who will admit the patient. [AW]    Clinical Course User Index [AW] Arnaldo Natal, MD   MDM Rules/Calculators/A&P                          Tamsen Snider presented to the ED after being found down for an undetermined length of time.  He had altered mental status and appeared to be profoundly dehydrated.  Initial blood pressure was presumed to be low because he could not easily  palpate radial pulses.  When the blood pressure was finally recorded, it was slightly low with a normal map.  He maintained his blood pressure in roughly this range.  He was also febrile, and he was treated according to sepsis algorithm.  Labs returned with significant abnormalities showing acute kidney injury possibly secondary to rhabdomyolysis, dehydration, or both.  He had an elevated troponin which could be due to hypoperfusion from dehydration or cardiac injury from underlying septic source.  He was not given heparin as there were other more likely etiologies of his troponin elevation than a primary cardiac obstructive process.  He was given IV fluids, and he will be admitted to the ICU for further care. Final Clinical Impression(s) / ED Diagnoses Final diagnoses:  AKI (acute kidney injury) ([place])  Sepsis with acute renal failure and septic shock, due to unspecified organism, unspecified acute renal failure type [facility])  Non-traumatic rhabdomyolysis  NSTEMI (non-ST elevated myocardial infarction) ([place])  Dehydration    Rx / DC Orders ED Discharge Orders    None       Arnaldo Natal, MD 10/12/20 2025

## 2020-10-12 NOTE — H&P (Addendum)
NAME:  Raymond Stone, MRN:  [id number], DOB:  Nov 01, 1950, LOS: 0 ADMISSION DATE:  10/12/2020, CONSULTATION DATE:  10/12/20 REFERRING MD: Dr. Joya Gaskins ED, CHIEF COMPLAINT:  Altered, found down  History of Present Illness:  Raymond Stone in a 70 yo man with hx of HTN, lives alone, found down after not being seen since Monday (5 days).  EMS was not able to initially get a bp.   Fever - temp 100.  On NRB initially, on room air at my exam Has received 2.7 L NS.  Now on 250cc/hr ns.    In Ed: got 1 g ca gluc, bicarb 1 amp, insulin 10u, D50  830pm Vanc and zosyn ordered.   Pertinent  Medical History  HTN Peripheral edema Hx of Angioedema Hx of AKI Hx of anemia Eczema CHF (grade 1, LVH by 2017 echo) Etoh 3-4 per week  Meds: norvasc, desowen, colace, metoprolol 25 tid,  Allergies lisinopril    Significant Hospital Events: Including procedures, antibiotic start and stop dates in addition to other pertinent events   .   Interim History / Subjective:    Objective   Blood pressure 111/66, pulse (!) 109, temperature 100 F (37.8 C), temperature source Rectal, resp. rate (!) 29, SpO2 95 %.        Intake/Output Summary (Last 24 hours) at 10/12/2020 2030 Last data filed at 10/12/2020 1824 Gross per 24 hour  Intake --  Output 50 ml  Net -50 ml   There were no vitals filed for this visit.  Examination: General: NAD, awake but drowsy, speaking, but not answering questions, tangential.  HENT: NCAT, mm dry .   Lungs: Clear lungs Cardiovascular: RRR no mgr Abdomen: nt, nd, nbs Extremities: dry, no edema,  Neuro: Altered, tangential, not answering basic questions.  Speaking    Labs/imaging that I havepersonally reviewed  (right click and "Reselect all SmartList Selections" daily)   CMP Trop 489 Lactic acid 7.8 CPK 2442 WBC 18.4 INR 1.6 UA 21-50 WBC, small leuks, 0-5 squames Blood culture drawn  CXR Nl  CT head: IMPRESSION: 1. No acute intracranial findings. 2. Progressive  atrophy and white matter microvascular disease compared to CT exam 2014.  EKG reviewed  2017 echo  Impressions:   - LVEF 60-65%, mild LVH, normal wall motion, diastolic dysfunction  with elevated LV filling pressure, normal GLPSS at -21%, trivial  MR, moderate LAE, mild TR, top normal RVSP of 33 mmHG, normal  IVC, mobile IAS - cannot exclude small PFO.  Resolved Hospital Problem list     Assessment & Plan:  AMS, AKI, hypernatremia, hyperchloremia, elevated lactate, hyperkalemia: severe dehydration.  Recheck labs after trial of fluids. He has received temporizing treatments for hyperkalemia.   He is making a small amount of urine, which is promising.  If labs improved after fluids, hopefully will not need HD or CRRT emergently.    HTN: hold home meds.   Best practice (right click and "Reselect all SmartList Selections" daily)  Diet:  NPO Pain/Anxiety/Delirium protocol (if indicated): No VAP protocol (if indicated): Not indicated DVT prophylaxis: Subcutaneous Heparin GI prophylaxis: PPI Glucose control:  SSI No Central venous access:  N/A Arterial line:  N/A Foley:  N/A Mobility:  bed rest  PT consulted: N/A Last date of multidisciplinary goals of care discussion []  Code Status:  full code Disposition: ICU   Labs   CBC: Recent Labs  Lab 10/12/20 1740  WBC 18.4*  NEUTROABS 14.8*  HGB 15.0  HCT 49.9  MCV 111.6*  PLT 523*    Basic Metabolic Panel: Recent Labs  Lab 10/12/20 1854  NA 172*  K 7.3*  CL >130*  CO2 15*  GLUCOSE 156*  BUN 211*  CREATININE 11.25*  CALCIUM 10.9*   GFR: CrCl cannot be calculated (Unknown ideal weight.). Recent Labs  Lab 10/12/20 1740  WBC 18.4*  LATICACIDVEN 7.8*    Liver Function Tests: Recent Labs  Lab 10/12/20 1854  AST 118*  ALT 42  ALKPHOS 61  BILITOT 1.5*  PROT 9.1*  ALBUMIN 2.2*   No results for input(s): LIPASE, AMYLASE in the last 168 hours. Recent Labs  Lab 10/12/20 1826  AMMONIA 46*     ABG    Component Value Date/Time   PHART 7.339 (L) 05/22/2017 0325   PCO2ART 36.6 05/22/2017 0325   PO2ART 183 (H) 05/22/2017 0325   HCO3 19.2 (L) 05/22/2017 0325   TCO2 25 12/17/2015 1337   ACIDBASEDEF 5.5 (H) 05/22/2017 0325   O2SAT 98.7 05/22/2017 0325     Coagulation Profile: Recent Labs  Lab 10/12/20 1740  INR 1.6*    Cardiac Enzymes: Recent Labs  Lab 10/12/20 1826  CKTOTAL 2,442*    HbA1C: Hemoglobin A1C  Date/Time Value Ref Range Status  01/23/2020 09:16 AM 5.3 4.0 - 5.6 % Final  01/31/2014 11:43 AM 5.8  Final   Hgb A1c MFr Bld  Date/Time Value Ref Range Status  05/22/2017 02:02 PM 6.0 (H) 4.8 - 5.6 % Final    Comment:    (NOTE) Pre diabetes:          5.7%-6.4% Diabetes:              >6.4% Glycemic control for   <7.0% adults with diabetes     CBG: No results for input(s): GLUCAP in the last 168 hours.  Review of Systems:   Unable to assess  Past Medical History:  He,  has a past medical history of Anemia, Eczema, Fluid collection (edema) in the arms, legs, hands and feet, and Hypertension.   Surgical History:   Past Surgical History:  Procedure Laterality Date  . COLONOSCOPY    . FINGER SURGERY Right 2013  . I & D EXTREMITY Right 09/13/2012   Procedure: IRRIGATION AND DEBRIDEMENT EXTREMITY  RIGHT MIDDLE FINGER WITH REVISION AMPUTATION AND SKIN GRAFTING.;  Surgeon: Roseanne Kaufman, MD;  Location: [facility];  Service: Orthopedics;  Laterality: Right;     Social History:   reports that he has never smoked. He has never used smokeless tobacco. He reports current alcohol use of about 3.0 - 4.0 standard drinks of alcohol per week. He reports that he does not use drugs.   Family History:  His family history includes Lupus in his daughter; Stomach cancer in his brother. There is no history of Colon cancer, Colon polyps, Esophageal cancer, or Rectal cancer.   Allergies Allergies  Allergen Reactions  . Lisinopril Swelling    ANGIOEDEMA     Home  Medications  Prior to Admission medications   Medication Sig Start Date End Date Taking? Authorizing Provider  amLODipine (NORVASC) 10 MG tablet Take 1 tablet (10 mg total) by mouth daily. 07/24/20   Raymond Perna, NP  desonide (DESOWEN) 0.05 % ointment Apply topically 2 (two) times daily. 09/12/20   Raymond Perna, NP  docusate sodium (COLACE) 100 MG capsule Take 100 mg by mouth 2 (two) times daily as needed for mild constipation.    [provider]  metoprolol tartrate (LOPRESSOR) 25 MG tablet TAKE 1  TABLET(25 MG) BY MOUTH THREE TIMES DAILY 07/26/20   Raymond Perna, NP  triamcinolone ointment (KENALOG) 0.1 % Apply 1 application topically daily after lunch. For eczema on all areas except face 09/09/20   Raymond Perna, NP     Critical care time: 45 minutes

## 2020-10-12 NOTE — Consult Note (Signed)
[facility]  INPATIENT CONSULTATION  Reason for Consultation: AKI Requesting Provider: Dr. Joya Gaskins  HPI: Dontea Corlew is an 70 y.o. male with h/o HTN, h/o anaphylaxis from ACEi, eczema who presented today after being found down and is seen for severe AKI.   Last seen 5 days ago, apt manager concerned so entered and found him on floor unresponsive.  EMS called, placed IO and transported to [facility].  T 37.8, BP 90/60s, O2 sats ok.  Labs with Na 172, K 7.3, Cl > 130, Bicarb 15, BUN 211, Cr 11.2, Ca 10.9, Alb 2.2, CK 2442, Trop 489, WBC 18, Hb 15, PLt 523. Lactate 7.2. CXR clear, head CT atrophy but no acute issue.    Treated with NS bolus 2.7L and now 276mL/hr, vanc/zosyn for sepsis protocol.  ABG and repeat labs pending.  PCCM called to eval for admission.   Unable to provide any history.    PMH: Past Medical History:  Diagnosis Date  . Anemia   . Eczema   . Fluid collection (edema) in the arms, legs, hands and feet   . Hypertension    PSH: Past Surgical History:  Procedure Laterality Date  . COLONOSCOPY    . FINGER SURGERY Right 2013  . I & D EXTREMITY Right 09/13/2012   Procedure: IRRIGATION AND DEBRIDEMENT EXTREMITY  RIGHT MIDDLE FINGER WITH REVISION AMPUTATION AND SKIN GRAFTING.;  Surgeon: Roseanne Kaufman, MD;  Location: [facility];  Service: Orthopedics;  Laterality: Right;    Past Medical History:  Diagnosis Date  . Anemia   . Eczema   . Fluid collection (edema) in the arms, legs, hands and feet   . Hypertension     Medications:  I have reviewed the patient's current medications.  (Not in a hospital admission)   ALLERGIES:   Allergies  Allergen Reactions  . Lisinopril Swelling    ANGIOEDEMA    FAM HX: Family History  Problem Relation Age of Onset  . Lupus Daughter   . Stomach cancer Brother   . Colon cancer Neg Hx   . Colon polyps Neg Hx   . Esophageal cancer Neg Hx   . Rectal cancer Neg Hx     Social History:   reports that he has never  smoked. He has never used smokeless tobacco. He reports current alcohol use of about 3.0 - 4.0 standard drinks of alcohol per week. He reports that he does not use drugs.  ROS: unable to obtain from altered patient  Blood pressure 112/79, pulse (!) 109, temperature 100 F (37.8 C), temperature source Rectal, resp. rate (!) 26, SpO2 95 %. PHYSICAL EXAM: Gen: thin man in distress after ABG was obtained  Eyes: anicteric ENT: MM cracked, poor dentition Neck: supple, flat JVD CV:  Tachycardic, regular, no rub Abd: soft, thin, nontender Lungs: clear GU: foley with just a few mLs amber urine Extr: no edema, warm feet Neuro:  Awake and moaning, will not follow commands but appears to be moving everything normally Skin: dry, R knee and L flank with denuded areas, no rashes or cellulitus   Results for orders placed or performed during the hospital encounter of 10/12/20 (from the past 48 hour(s))  CBC with Differential     Status: Abnormal   Collection Time: 10/12/20  5:40 PM  Result Value Ref Range   WBC 18.4 (H) 4.0 - 10.5 K/uL   RBC 4.47 4.22 - 5.81 MIL/uL   Hemoglobin 15.0 13.0 - 17.0 g/dL   HCT 49.9 39.0 - 52.0 %  MCV 111.6 (H) 80.0 - 100.0 fL   MCH 33.6 26.0 - 34.0 pg   MCHC 30.1 30.0 - 36.0 g/dL   RDW 15.3 11.5 - 15.5 %   Platelets 523 (H) 150 - 400 K/uL   nRBC 0.3 (H) 0.0 - 0.2 %   Neutrophils Relative % 80 %   Neutro Abs 14.8 (H) 1.7 - 7.7 K/uL   Lymphocytes Relative 10 %   Lymphs Abs 1.8 0.7 - 4.0 K/uL   Monocytes Relative 7 %   Monocytes Absolute 1.2 (H) 0.1 - 1.0 K/uL   Eosinophils Relative 0 %   Eosinophils Absolute 0.0 0.0 - 0.5 K/uL   Basophils Relative 0 %   Basophils Absolute 0.0 0.0 - 0.1 K/uL   Immature Granulocytes 3 %   Abs Immature Granulocytes 0.56 (H) 0.00 - 0.07 K/uL    Comment: Performed at [facility] [address], [place], [place] [address]  Lactic acid, plasma     Status: Abnormal   Collection Time: 10/12/20  5:40 PM  Result Value Ref Range    Lactic Acid, Venous 7.8 (HH) 0.5 - 1.9 mmol/L    Comment: CRITICAL RESULT CALLED TO, READ BACK BY AND VERIFIED WITH: GIGI LASHER RN.@[email or web address]  ON 4.2.22 BY TCALDWELL MT. Performed at [facility], [place] [address] [place], [place] [address]   Protime-INR     Status: Abnormal   Collection Time: 10/12/20  5:40 PM  Result Value Ref Range   Prothrombin Time 18.1 (H) 11.4 - 15.2 seconds   INR 1.6 (H) 0.8 - 1.2    Comment: (NOTE) INR goal varies based on device and disease states. Performed at [facility], [place] [address], [place], [place] [address]   Blood culture (routine x 2)     Status: None (Preliminary result)   Collection Time: 10/12/20  5:40 PM   Specimen: Blood  Result Value Ref Range   Specimen Description BLOOD SITE NOT SPECIFIED    Special Requests      IN BOTH AEROBIC AND ANAEROBIC BOTTLES Blood Culture adequate volume Performed at [facility], [place] [address] [place], [place] [address]    Culture PENDING    Report Status PENDING   Acetaminophen level     Status: Abnormal   Collection Time: 10/12/20  5:40 PM  Result Value Ref Range   Acetaminophen (Tylenol), Serum <10 (L) 10 - 30 ug/mL    Comment: (NOTE) Therapeutic concentrations vary significantly. A range of 10-30 ug/mL  may be an effective concentration for many patients. However, some  are best treated at concentrations outside of this range. Acetaminophen concentrations >150 ug/mL at 4 hours after ingestion  and >50 ug/mL at 12 hours after ingestion are often associated with  toxic reactions.  Performed at [facility], [place] [address] [place], [place] [address]   Ethanol     Status: None   Collection Time: 10/12/20  5:40 PM  Result Value Ref Range   Alcohol, Ethyl (B) <10 <10 mg/dL    Comment: (NOTE) Lowest detectable limit for serum alcohol is 10 mg/dL.  For medical purposes only. Performed at [facility], [place] [address] [place], [place] [address]   Salicylate level      Status: Abnormal   Collection Time: 10/12/20  5:40 PM  Result Value Ref Range   Salicylate Lvl <2.0 (L) 7.0 - 30.0 mg/dL    Comment: Performed at [facility] [address]  [place], [place] [address]  Rapid HIV screen (HIV 1/2 Ab+Ag)     Status: None   Collection Time: 10/12/20  5:40 PM  Result Value Ref Range   HIV-1 P24 Antigen - HIV24 NON REACTIVE NON REACTIVE    Comment: (NOTE) Detection of p24 may be inhibited by biotin in the sample, causing false negative results in acute infection.    HIV 1/2 Antibodies NON REACTIVE NON REACTIVE   Interpretation (HIV Ag Ab)      A non reactive test result means that HIV 1 or HIV 2 antibodies and HIV 1 p24 antigen were not detected in the specimen.    Comment: Performed at [facility], [place] [address] [place], [place] [address]  Ammonia     Status: Abnormal   Collection Time: 10/12/20  6:26 PM  Result Value Ref Range   Ammonia 46 (H) 9 - 35 umol/L    Comment: Performed at [facility], [place] [address], [place], [place] [address]  CK     Status: Abnormal   Collection Time: 10/12/20  6:26 PM  Result Value Ref Range   Total CK 2,442 (H) 49 - 397 U/L    Comment: Performed at [facility], [place] [address], [place], [place] [address]  Urinalysis, Routine w reflex microscopic Urine, Catheterized     Status: Abnormal   Collection Time: 10/12/20  6:41 PM  Result Value Ref Range   Color, Urine AMBER (A) YELLOW    Comment: BIOCHEMICALS MAY BE AFFECTED BY COLOR   APPearance HAZY (A) CLEAR   Specific Gravity, Urine 1.026 1.005 - 1.030   pH 5.0 5.0 - 8.0   Glucose, UA 50 (A) NEGATIVE mg/dL   Hgb urine dipstick NEGATIVE NEGATIVE   Bilirubin Urine SMALL (A) NEGATIVE   Ketones, ur NEGATIVE NEGATIVE mg/dL   Protein, ur NEGATIVE NEGATIVE mg/dL   Nitrite NEGATIVE NEGATIVE   Leukocytes,Ua SMALL (A) NEGATIVE   RBC / HPF 0-5 0 - 5 RBC/hpf   WBC, UA 21-50 0 - 5 WBC/hpf   Bacteria, UA NONE SEEN NONE SEEN   Squamous Epithelial /  LPF 0-5 0 - 5   Mucus PRESENT     Comment: Performed at [facility], [place] [address] [place],  [address]  Urine rapid drug screen (hosp performed)     Status: None   Collection Time: 10/12/20  6:42 PM  Result Value Ref Range   Opiates NONE DETECTED NONE DETECTED   Cocaine NONE DETECTED NONE DETECTED   Benzodiazepines NONE DETECTED NONE DETECTED   Amphetamines NONE DETECTED NONE DETECTED   Tetrahydrocannabinol NONE DETECTED NONE DETECTED   Barbiturates NONE DETECTED NONE DETECTED    Comment: (NOTE) DRUG SCREEN FOR MEDICAL PURPOSES ONLY.  IF CONFIRMATION IS NEEDED FOR ANY PURPOSE, NOTIFY LAB WITHIN 5 DAYS.  LOWEST DETECTABLE LIMITS FOR URINE DRUG SCREEN Drug Class                     Cutoff (ng/mL) Amphetamine and metabolites    1000 Barbiturate and metabolites    200 Benzodiazepine                 948 Tricyclics and metabolites     300 Opiates and metabolites        300 Cocaine and metabolites        300 THC  50 Performed at [facility], [place] [address], [place], [place] [address]   Comprehensive metabolic panel     Status: Abnormal   Collection Time: 10/12/20  6:54 PM  Result Value Ref Range   Sodium 172 (HH) 135 - 145 mmol/L    Comment: CRITICAL RESULT CALLED TO, READ BACK BY AND VERIFIED WITH: K.WILLIAMS RN @ 1959 10/12/2020 BY C.EDENS    Potassium 7.3 (HH) 3.5 - 5.1 mmol/L    Comment: CRITICAL RESULT CALLED TO, READ BACK BY AND VERIFIED WITH: K.WILLIAMS RN @ 1959 10/12/2020 BY C.EDENS RESULTS VERIFIED VIA RECOLLECT    Chloride >130 (HH) 98 - 111 mmol/L    Comment: CRITICAL RESULT CALLED TO, READ BACK BY AND VERIFIED WITH: K.WILLIAMS RN @ 1959 10/12/2020 BY C.EDENS    CO2 15 (L) 22 - 32 mmol/L   Glucose, Bld 156 (H) 70 - 99 mg/dL    Comment: Glucose reference range applies only to samples taken after fasting for at least 8 hours.   BUN 211 (H) 8 - 23 mg/dL   Creatinine, Ser 11.25 (H) 0.61 - 1.24 mg/dL   Calcium 10.9 (H) 8.9  - 10.3 mg/dL   Total Protein 9.1 (H) 6.5 - 8.1 g/dL   Albumin 2.2 (L) 3.5 - 5.0 g/dL   AST 118 (H) 15 - 41 U/L   ALT 42 0 - 44 U/L   Alkaline Phosphatase 61 38 - 126 U/L   Total Bilirubin 1.5 (H) 0.3 - 1.2 mg/dL   GFR, Estimated 4 (L) >60 mL/min    Comment: (NOTE) Calculated using the CKD-EPI Creatinine Equation (2021)    Anion gap NOT CALCULATED 5 - 15    Comment: Performed at [facility], [place] [address] [place], [place] [address]  Troponin I (High Sensitivity)     Status: Abnormal   Collection Time: 10/12/20  6:54 PM  Result Value Ref Range   Troponin I (High Sensitivity) 489 (HH) <18 ng/L    Comment: CRITICAL RESULT CALLED TO, READ BACK BY AND VERIFIED WITH: K.WILLIAMS RN @ [phone] 10/12/2020 BY C.EDENS (NOTE) Elevated high sensitivity troponin I (hsTnI) values and significant  changes across serial measurements may suggest ACS but many other  chronic and acute conditions are known to elevate hsTnI results.  Refer to the Links section for chest pain algorithms and additional  guidance. Performed at [facility], [place] [address], [place], [place] [address]   Troponin I (High Sensitivity)     Status: Abnormal   Collection Time: 10/12/20  8:08 PM  Result Value Ref Range   Troponin I (High Sensitivity) 395 (HH) <18 ng/L    Comment: CRITICAL VALUE NOTED.  VALUE IS CONSISTENT WITH PREVIOUSLY REPORTED AND CALLED VALUE. (NOTE) Elevated high sensitivity troponin I (hsTnI) values and significant  changes across serial measurements may suggest ACS but many other  chronic and acute conditions are known to elevate hsTnI results.  Refer to the Links section for chest pain algorithms and additional  guidance. Performed at [facility], [place] [address], [place], [place] [address]     CT Head Wo Contrast  Result Date: 10/12/2020 CLINICAL DATA:  Mental status change EXAM: CT HEAD WITHOUT CONTRAST TECHNIQUE: Contiguous axial images were obtained from the base of the skull  through the vertex without intravenous contrast. COMPARISON:  04/20/2013 FINDINGS: Brain: No acute intracranial hemorrhage. No focal mass lesion. No CT evidence of acute infarction. No midline shift or mass effect. No hydrocephalus. Basilar cisterns  are patent. There are periventricular and subcortical white matter hypodensities. Generalized cortical atrophy. Vascular: No hyperdense vessel or unexpected calcification. Skull: Normal. Negative for fracture or focal lesion. Sinuses/Orbits: Paranasal sinuses and mastoid air cells are clear. Orbits are clear. Other: None. IMPRESSION: 1. No acute intracranial findings. 2. Progressive atrophy and white matter microvascular disease compared to CT exam 2014. Electronically Signed   By: Suzy Bouchard M.D.   On: 10/12/2020 18:55   DG Chest Port 1 View  Result Date: 10/12/2020 CLINICAL DATA:  Fever, altered mental status EXAM: PORTABLE CHEST 1 VIEW COMPARISON:  None. FINDINGS: Normal mediastinum and cardiac silhouette. Normal pulmonary vasculature. No evidence of effusion, infiltrate, or pneumothorax. No acute bony abnormality. IMPRESSION: No acute cardiopulmonary process. Electronically Signed   By: Suzy Bouchard M.D.   On: 10/12/2020 19:00    Assessment/Plan **Shock:  Hypovolemic, suspect septic as well.  Volume resuscitation underway and broad spectrum antibiotics.  ABG and lactate pending.  May need vasopressor support.  Cultures are pending.   **AKI, severe:  Presumably hypovolemic that's progressed to ATN; CK modestly elevated which isn't helping.  D/w RN - bladder scan now but foley in place not draining; presumably anuric.  Renal US when able.  FeNa.  Volume resuscitating but stop if developing volume overload.   May end up needing RRT overnight if not improving on repeat labs which are pending.  Follow I/Os, avoid nephrotoxins, dose meds for GFR < 10.  **Hyperkalemia: severe,  Has been managed medically with insulin/dextrose/bicarb/ca and volume  expansion.  Repeat pending.  Doesn't look like he could take po at this time but will order lokelma if he is able to vs if NG is inserted.      **Hypernatremia,severe: no po intake x days.  Being volume resuscitated with 0.9% NS currently.  Trend and avoid overcorrection.  Free water as needed but should improve with NS at this level.   **AGMA: AKI and lactate.  Care per above.  If severe acidosis on ABG will switch to bicarb gtt.   Will follow, contact with concerns.   Justin Mend 10/12/2020, 10:02 PM

## 2020-10-12 NOTE — ED Notes (Signed)
XR bedside.

## 2020-10-12 NOTE — ED Notes (Signed)
Sodium 171, Chloride 130--critical results called in by lab. Will alert provider.

## 2020-10-12 NOTE — ED Notes (Signed)
Critical result reported to Dr. Joya Gaskins

## 2020-10-13 ENCOUNTER — Inpatient Hospital Stay (HOSPITAL_COMMUNITY): Payer: Medicare Other

## 2020-10-13 ENCOUNTER — Other Ambulatory Visit: Payer: Self-pay

## 2020-10-13 ENCOUNTER — Encounter (HOSPITAL_COMMUNITY): Payer: Self-pay | Admitting: Pulmonary Disease

## 2020-10-13 DIAGNOSIS — E878 Other disorders of electrolyte and fluid balance, not elsewhere classified: Secondary | ICD-10-CM | POA: Diagnosis not present

## 2020-10-13 DIAGNOSIS — R579 Shock, unspecified: Secondary | ICD-10-CM

## 2020-10-13 DIAGNOSIS — N179 Acute kidney failure, unspecified: Secondary | ICD-10-CM | POA: Diagnosis not present

## 2020-10-13 DIAGNOSIS — E43 Unspecified severe protein-calorie malnutrition: Secondary | ICD-10-CM

## 2020-10-13 DIAGNOSIS — E860 Dehydration: Secondary | ICD-10-CM | POA: Diagnosis not present

## 2020-10-13 DIAGNOSIS — L8990 Pressure ulcer of unspecified site, unspecified stage: Secondary | ICD-10-CM | POA: Insufficient documentation

## 2020-10-13 DIAGNOSIS — N19 Unspecified kidney failure: Secondary | ICD-10-CM | POA: Diagnosis not present

## 2020-10-13 DIAGNOSIS — G9341 Metabolic encephalopathy: Secondary | ICD-10-CM

## 2020-10-13 DIAGNOSIS — G9349 Other encephalopathy: Secondary | ICD-10-CM

## 2020-10-13 DIAGNOSIS — M6282 Rhabdomyolysis: Secondary | ICD-10-CM

## 2020-10-13 DIAGNOSIS — E870 Hyperosmolality and hypernatremia: Secondary | ICD-10-CM

## 2020-10-13 LAB — GLUCOSE, CAPILLARY
Glucose-Capillary: 78 mg/dL (ref 70–99)
Glucose-Capillary: 95 mg/dL (ref 70–99)

## 2020-10-13 LAB — HEPATITIS PANEL, ACUTE
HCV Ab: NONREACTIVE
Hep A IgM: NONREACTIVE
Hep B C IgM: NONREACTIVE
Hepatitis B Surface Ag: NONREACTIVE

## 2020-10-13 LAB — COMPREHENSIVE METABOLIC PANEL
ALT: 33 U/L (ref 0–44)
AST: 101 U/L — ABNORMAL HIGH (ref 15–41)
Albumin: 1.6 g/dL — ABNORMAL LOW (ref 3.5–5.0)
Alkaline Phosphatase: 43 U/L (ref 38–126)
BUN: 195 mg/dL — ABNORMAL HIGH (ref 8–23)
CO2: 16 mmol/L — ABNORMAL LOW (ref 22–32)
Calcium: 8.7 mg/dL — ABNORMAL LOW (ref 8.9–10.3)
Chloride: >130 mmol/L (ref 98–111)
Creatinine, Ser: 9.76 mg/dL — ABNORMAL HIGH (ref 0.61–1.24)
GFR, Estimated: 5 mL/min — ABNORMAL LOW (ref >60)
Glucose, Bld: 154 mg/dL — ABNORMAL HIGH (ref 70–99)
Potassium: 6.2 mmol/L — ABNORMAL HIGH (ref 3.5–5.1)
Sodium: 170 mmol/L (ref 135–145)
Total Bilirubin: 1.1 mg/dL (ref 0.3–1.2)
Total Protein: 6.3 g/dL — ABNORMAL LOW (ref 6.5–8.1)

## 2020-10-13 LAB — CBC WITH DIFFERENTIAL/PLATELET
Abs Immature Granulocytes: 0.89 10*3/uL — ABNORMAL HIGH (ref 0.00–0.07)
Basophils Absolute: 0 10*3/uL (ref 0.0–0.1)
Basophils Relative: 0 %
Eosinophils Absolute: 0 10*3/uL (ref 0.0–0.5)
Eosinophils Relative: 0 %
HCT: 39 % (ref 39.0–52.0)
Hemoglobin: 12.1 g/dL — ABNORMAL LOW (ref 13.0–17.0)
Immature Granulocytes: 5 %
Lymphocytes Relative: 9 %
Lymphs Abs: 1.7 10*3/uL (ref 0.7–4.0)
MCH: 33.9 pg (ref 26.0–34.0)
MCHC: 31 g/dL (ref 30.0–36.0)
MCV: 109.2 fL — ABNORMAL HIGH (ref 80.0–100.0)
Monocytes Absolute: 1.1 10*3/uL — ABNORMAL HIGH (ref 0.1–1.0)
Monocytes Relative: 6 %
Neutro Abs: 15.7 10*3/uL — ABNORMAL HIGH (ref 1.7–7.7)
Neutrophils Relative %: 80 %
Platelets: 320 10*3/uL (ref 150–400)
RBC: 3.57 MIL/uL — ABNORMAL LOW (ref 4.22–5.81)
RDW: 15.5 % (ref 11.5–15.5)
WBC: 19.5 10*3/uL — ABNORMAL HIGH (ref 4.0–10.5)
nRBC: 0 % (ref 0.0–0.2)

## 2020-10-13 LAB — CBC
HCT: 37.6 % — ABNORMAL LOW (ref 39.0–52.0)
Hemoglobin: 11.1 g/dL — ABNORMAL LOW (ref 13.0–17.0)
MCH: 34 pg (ref 26.0–34.0)
MCHC: 29.5 g/dL — ABNORMAL LOW (ref 30.0–36.0)
MCV: 115.3 fL — ABNORMAL HIGH (ref 80.0–100.0)
Platelets: 346 10*3/uL (ref 150–400)
RBC: 3.26 MIL/uL — ABNORMAL LOW (ref 4.22–5.81)
RDW: 15.5 % (ref 11.5–15.5)
WBC: 18.7 10*3/uL — ABNORMAL HIGH (ref 4.0–10.5)
nRBC: 0.1 % (ref 0.0–0.2)

## 2020-10-13 LAB — SARS CORONAVIRUS 2 (TAT 6-24 HRS): SARS Coronavirus 2: NEGATIVE

## 2020-10-13 LAB — RENAL FUNCTION PANEL
Albumin: 1.4 g/dL — ABNORMAL LOW (ref 3.5–5.0)
BUN: 170 mg/dL — ABNORMAL HIGH (ref 8–23)
CO2: 19 mmol/L — ABNORMAL LOW (ref 22–32)
Calcium: 8.3 mg/dL — ABNORMAL LOW (ref 8.9–10.3)
Chloride: >130 mmol/L (ref 98–111)
Creatinine, Ser: 8.09 mg/dL — ABNORMAL HIGH (ref 0.61–1.24)
GFR, Estimated: 7 mL/min — ABNORMAL LOW (ref >60)
Glucose, Bld: 105 mg/dL — ABNORMAL HIGH (ref 70–99)
Phosphorus: 5.2 mg/dL — ABNORMAL HIGH (ref 2.5–4.6)
Potassium: 5.7 mmol/L — ABNORMAL HIGH (ref 3.5–5.1)
Sodium: 165 mmol/L (ref 135–145)

## 2020-10-13 LAB — POTASSIUM: Potassium: 5.9 mmol/L — ABNORMAL HIGH (ref 3.5–5.1)

## 2020-10-13 LAB — HEPATITIS B SURFACE ANTIGEN: Hepatitis B Surface Ag: NONREACTIVE

## 2020-10-13 LAB — STREP PNEUMONIAE URINARY ANTIGEN: Strep Pneumo Urinary Antigen: NEGATIVE

## 2020-10-13 LAB — LACTIC ACID, PLASMA
Lactic Acid, Venous: 5.3 mmol/L (ref 0.5–1.9)
Lactic Acid, Venous: 3.1 mmol/L (ref 0.5–1.9)

## 2020-10-13 LAB — MRSA PCR SCREENING: MRSA by PCR: NEGATIVE

## 2020-10-13 LAB — MAGNESIUM: Magnesium: 3.9 mg/dL — ABNORMAL HIGH (ref 1.7–2.4)

## 2020-10-13 LAB — CK: Total CK: 2415 U/L — ABNORMAL HIGH (ref 49–397)

## 2020-10-13 MED ORDER — VANCOMYCIN HCL 1000 MG/200ML IV SOLN: 1000.0000 mg | INTRAVENOUS | Status: DC

## 2020-10-13 MED ORDER — ALBUMIN HUMAN 25 % IV SOLN
12.5000 g | Freq: Once | INTRAVENOUS | Status: AC
  Administered 2020-10-13: 12.5 g via INTRAVENOUS
  Filled 2020-10-13: qty 50

## 2020-10-13 MED ORDER — PIPERACILLIN-TAZOBACTAM 3.375 G IVPB 30 MIN
3.3750 g | Freq: Four times a day (QID) | INTRAVENOUS | Status: DC
  Administered 2020-10-13 – 2020-10-14 (×5): 3.375 g via INTRAVENOUS
  Filled 2020-10-13 (×9): qty 50

## 2020-10-13 MED ORDER — CHLORHEXIDINE GLUCONATE 0.12 % MT SOLN
15.0000 mL | Freq: Two times a day (BID) | OROMUCOSAL | Status: DC
  Administered 2020-10-13 – 2020-10-17 (×9): 15 mL via OROMUCOSAL
  Filled 2020-10-13 (×8): qty 15

## 2020-10-13 MED ORDER — SODIUM CHLORIDE 0.9 % IV SOLN
INTRAVENOUS | Status: DC | PRN
  Administered 2020-10-13: 250 mL via INTRAVENOUS
  Administered 2020-10-21: 1000 mL via INTRAVENOUS

## 2020-10-13 MED ORDER — LACTATED RINGERS IV SOLN: INTRAVENOUS | Status: AC

## 2020-10-13 MED ORDER — SODIUM CHLORIDE 0.9% FLUSH
10.0000 mL | Freq: Two times a day (BID) | INTRAVENOUS | Status: DC
  Administered 2020-10-14 – 2020-10-22 (×10): 10 mL

## 2020-10-13 MED ORDER — CHLORHEXIDINE GLUCONATE CLOTH 2 % EX PADS
6.0000 | MEDICATED_PAD | Freq: Every day | CUTANEOUS | Status: DC
  Administered 2020-10-13 – 2020-10-17 (×5): 6 via TOPICAL

## 2020-10-13 MED ORDER — PRISMASOL BGK 0/2.5 32-2.5 MEQ/L EC SOLN
Status: DC
  Filled 2020-10-13 (×3): qty 5000

## 2020-10-13 MED ORDER — LIDOCAINE-EPINEPHRINE 1 %-1:100000 IJ SOLN
20.0000 mL | Freq: Once | INTRAMUSCULAR | Status: DC
  Filled 2020-10-13: qty 1

## 2020-10-13 MED ORDER — DEXMEDETOMIDINE HCL IN NACL 400 MCG/100ML IV SOLN
0.2000 ug/kg/h | INTRAVENOUS | Status: DC
  Administered 2020-10-13: 0.3 ug/kg/h via INTRAVENOUS
  Administered 2020-10-13: 0.2 ug/kg/h via INTRAVENOUS
  Filled 2020-10-13 (×3): qty 100

## 2020-10-13 MED ORDER — LACTATED RINGERS IV BOLUS
1000.0000 mL | Freq: Once | INTRAVENOUS | Status: AC
  Administered 2020-10-13: 1000 mL via INTRAVENOUS

## 2020-10-13 MED ORDER — LIDOCAINE-EPINEPHRINE 1 %-1:100000 IJ SOLN
5.0000 mL | Freq: Once | INTRAMUSCULAR | Status: AC
  Administered 2020-10-13: 5 mL via INTRADERMAL

## 2020-10-13 MED ORDER — NOREPINEPHRINE 4 MG/250ML-% IV SOLN
0.0000 ug/min | INTRAVENOUS | Status: DC
  Administered 2020-10-13: 2 ug/min via INTRAVENOUS
  Administered 2020-10-14: 5 ug/min via INTRAVENOUS
  Filled 2020-10-13 (×2): qty 250

## 2020-10-13 MED ORDER — PRISMASOL BGK 4/2.5 32-4-2.5 MEQ/L EC SOLN: Status: DC

## 2020-10-13 MED ORDER — SODIUM CHLORIDE 0.9% FLUSH
10.0000 mL | INTRAVENOUS | Status: DC | PRN
Start: 2020-10-13 — End: 2020-10-23

## 2020-10-13 MED ORDER — ORAL CARE MOUTH RINSE
15.0000 mL | Freq: Two times a day (BID) | OROMUCOSAL | Status: DC
  Administered 2020-10-13 – 2020-10-22 (×14): 15 mL via OROMUCOSAL

## 2020-10-13 MED ORDER — HEPARIN SODIUM (PORCINE) 1000 UNIT/ML DIALYSIS
1000.0000 [IU] | INTRAMUSCULAR | Status: DC | PRN
  Administered 2020-10-13 – 2020-10-14 (×3): 2400 [IU] via INTRAVENOUS_CENTRAL
  Filled 2020-10-13 (×2): qty 6
  Filled 2020-10-13: qty 3

## 2020-10-13 MED ORDER — SODIUM CHLORIDE 0.9 % FOR CRRT: INTRAVENOUS_CENTRAL | Status: DC | PRN

## 2020-10-13 MED ORDER — DEXTROSE 5 % IV SOLN: INTRAVENOUS | Status: AC

## 2020-10-13 MED ORDER — FENTANYL CITRATE (PF) 100 MCG/2ML IJ SOLN
50.0000 ug | INTRAMUSCULAR | Status: DC | PRN
  Administered 2020-10-13 – 2020-10-15 (×5): 50 ug via INTRAVENOUS
  Filled 2020-10-13 (×5): qty 2

## 2020-10-13 NOTE — Progress Notes (Signed)
[facility] Code Sepsis completion note:  Still waiting for LA result, patient to be admitted to ICU bed for further monitoring.   Loukas Antonson DNP eLink RN

## 2020-10-13 NOTE — Progress Notes (Signed)
[facility] Progress Note Patient Name: Raymond Stone DOB: 1950-09-20 MRN: [id number]   Date of Service  10/13/2020  HPI/Events of Note  Troponin trending down. As well as LA down to 3.1. RFP still not resulted yet.  eICU Interventions  Continue care.     Intervention Category Intermediate Interventions: Diagnostic test evaluation  Elmer Sow 10/13/2020, 5:00 AM

## 2020-10-13 NOTE — Progress Notes (Addendum)
[facility] Progress Note Patient Name: Raymond Stone DOB: 03-12-1951 MRN: [id number]   Date of Service  10/13/2020  HPI/Events of Note  Notified about patient in ICU. No intervention at this time needed .  70 y.o. male with h/o HTN, h/o anaphylaxis from ACEi, eczema who presented today after being found down and is seen for severe AKI. Hypernatremic. Hyperkalemic.  Septic shock and severe dehydration.  Received over 3 lit of fluids, bicarb 50 once. Already seen by bed side CCM-Dr Elmyra Ricks at 10.30 PM. On vanc/zosyn.    Data: Reviewed. Tox neg. UA neg.  US renal: no hydronephrosis, CKD K at 6-down from > 7 , sodium 173 still. LA down to 5.3 Hg 11.6 AGMA. Worsening with bicarb down to 16 from 19, compensated by respiratory co2 at 26. CxR neg. Covid neg. Alcohol once a wek.   Camera: Sinus tachy 103.  SBP 130. On NS at 250 ml.hr  Hollering when moving him.  Confused. On 2 lit o2. sats ok. Left pressure wounds/eczema. Has a dressing.  Stage 2 on hip-pinkish red. UOP : 70 since admit.     eICU Interventions  - restraints ordered for pulling. - send all labs stat. RFP/LA/Mag. - dextrose 50  Ml/hr for  BG < 80. Do not over correct hypernatremia fast.  - Nephrology to follow through - precedex gtt low dose for now.  - might need CRRT if not better from lab follow through stat.  - asp precautions -sz precautions      Intervention Category Intermediate Interventions: Electrolyte abnormality - evaluation and management;Other:  Elmer Sow 10/13/2020, 2:20 AM

## 2020-10-13 NOTE — Progress Notes (Signed)
[facility] Progress Note   Subjective:   Remains lethargic.  In ICU now.  ~anuric.   Objective Vitals:   10/13/20 0600 10/13/20 0700 10/13/20 0734 10/13/20 0800  BP: 119/61 106/62    Pulse:      Resp: (!) 25 (!) 24  (!) 25  Temp:   97.6 F (36.4 C)   TempSrc:   Oral   SpO2: 98%     Weight:      Height:       Physical Exam General: remains lethargic but protecting airway ENT: MM dry Heart: tachy but regular, no rub Lungs: clear  Abdomen:soft Extremities: no edema Dialysis Access: none GU: foley draining scant amber urine  Additional Objective Labs: Basic Metabolic Panel: Recent Labs  Lab 10/12/20 1854 10/12/20 2130 10/12/20 2136 10/13/20 0535 10/13/20 0719  NA 172* 171* 173*  --  170*  K 7.3* 5.1 6.0* 5.9* 6.2*  CL >130* >130*  --   --  >130*  CO2 15* 19*  --   --  16*  GLUCOSE 156* 170*  --   --  154*  BUN 211* 198*  --   --  195*  CREATININE 11.25* 10.19*  --   --  9.76*  CALCIUM 10.9* 9.7  --   --  8.7*   Liver Function Tests: Recent Labs  Lab 10/12/20 1854 10/13/20 0719  AST 118* 101*  ALT 42 33  ALKPHOS 61 43  BILITOT 1.5* 1.1  PROT 9.1* 6.3*  ALBUMIN 2.2* 1.6*   No results for input(s): LIPASE, AMYLASE in the last 168 hours. CBC: Recent Labs  Lab 10/12/20 1740 10/12/20 2136 10/13/20 0120 10/13/20 0256  WBC 18.4*  --  18.7* 19.5*  NEUTROABS 14.8*  --   --  15.7*  HGB 15.0 11.2* 11.1* 12.1*  HCT 49.9 33.0* 37.6* 39.0  MCV 111.6*  --  115.3* 109.2*  PLT 523*  --  346 320   Blood Culture    Component Value Date/Time   SDES BLOOD SITE NOT SPECIFIED 10/12/2020 1740   SPECREQUEST  10/12/2020 1740    IN BOTH AEROBIC AND ANAEROBIC BOTTLES Blood Culture adequate volume Performed at [facility] [address] [place],  [address]    CULT PENDING 10/12/2020 1740   REPTSTATUS PENDING 10/12/2020 1740    Cardiac Enzymes: Recent Labs  Lab 10/12/20 1826 10/13/20 0256  CKTOTAL 2,442* 2,415*   CBG: No  results for input(s): GLUCAP in the last 168 hours. Iron Studies: No results for input(s): IRON, TIBC, TRANSFERRIN, FERRITIN in the last 72 hours. @lablastinr3 @ Studies/Results: CT Head Wo Contrast  Result Date: 10/12/2020 CLINICAL DATA:  Mental status change EXAM: CT HEAD WITHOUT CONTRAST TECHNIQUE: Contiguous axial images were obtained from the base of the skull through the vertex without intravenous contrast. COMPARISON:  04/20/2013 FINDINGS: Brain: No acute intracranial hemorrhage. No focal mass lesion. No CT evidence of acute infarction. No midline shift or mass effect. No hydrocephalus. Basilar cisterns are patent. There are periventricular and subcortical white matter hypodensities. Generalized cortical atrophy. Vascular: No hyperdense vessel or unexpected calcification. Skull: Normal. Negative for fracture or focal lesion. Sinuses/Orbits: Paranasal sinuses and mastoid air cells are clear. Orbits are clear. Other: None. IMPRESSION: 1. No acute intracranial findings. 2. Progressive atrophy and white matter microvascular disease compared to CT exam 2014. Electronically Signed   By: Suzy Bouchard M.D.   On: 10/12/2020 18:55   US RENAL  Result Date: 10/12/2020 CLINICAL DATA:  Acute kidney injury  EXAM: RENAL / URINARY TRACT ULTRASOUND COMPLETE COMPARISON:  None. FINDINGS: Right Kidney: Renal measurements: 10.1 x 6.2 x 5.5 cm = volume: 182 mL. 3 cm cyst in the lower pole. Mildly increased echotexture. No hydronephrosis. Left Kidney: Renal measurements: 9.9 x 5.8 x 4.5 cm. = volume: 134 mL. Increased echotexture. No mass or hydronephrosis. Bladder: Decompressed, Foley catheter in place. Other: None. IMPRESSION: Increased echotexture compatible with chronic medical renal disease. No hydronephrosis. Electronically Signed   By: Rolm Baptise M.D.   On: 10/12/2020 23:36   DG Chest Port 1 View  Result Date: 10/12/2020 CLINICAL DATA:  Fever, altered mental status EXAM: PORTABLE CHEST 1 VIEW COMPARISON:  None.  FINDINGS: Normal mediastinum and cardiac silhouette. Normal pulmonary vasculature. No evidence of effusion, infiltrate, or pneumothorax. No acute bony abnormality. IMPRESSION: No acute cardiopulmonary process. Electronically Signed   By: Suzy Bouchard M.D.   On: 10/12/2020 19:00   Medications: . dexmedetomidine (PRECEDEX) IV infusion 0.2 mcg/kg/hr (10/13/20 0700)  . dextrose 50 mL/hr at 10/13/20 0700  . lactated ringers 250 mL/hr at 10/13/20 0826  . piperacillin-tazobactam (ZOSYN)  IV Stopped (10/13/20 0552)  . prismasol BGK 2/2.5 replacement solution    . prismasol BGK 2/2.5 replacement solution    . prismasol BGK 4/2.5     . chlorhexidine  15 mL Mouth Rinse BID  . Chlorhexidine Gluconate Cloth  6 each Topical Q0600  . heparin  5,000 Units Subcutaneous Q8H  . mouth rinse  15 mL Mouth Rinse q12n4p  . pantoprazole (PROTONIX) IV  40 mg Intravenous QHS  . vancomycin variable dose per unstable renal function (pharmacist dosing)   Does not apply See admin instructions   Assessment/Plan **Shock:  Hypovolemic, suspect septic as well.  Volume resuscitation underway and broad spectrum antibiotics.   Cultures are pending.   **AKI, severe: Cr 1.14 in 01/2020; on presentation 11.  Presumably hypovolemic that's progressed to ATN; CK modestly elevated which isn't helping. Renal US without acute issue.  Volume resuscitating but stop if developing volume overload.   For ongoing azotemia and multiple electrolyte abnormalities will need to start CRRT today - d/w sister who is in agreement.  PCCM to place line - appreciated.  2K, FRR 10cc/hr.    Follow I/Os, avoid nephrotoxins, dose meds for GFR < 10.  **Hyperkalemia: severe,  Has been managed medically with insulin/dextrose/bicarb/ca and volume expansion.  Repeat improved but back to 6.2 this AM.  Manage with CRRT.   **Hypernatremia,severe: no po intake x days.  on LR volume expansion and D5W.  Trend.   **AGMA: AKI and lactate.  Care per above. PH on  ABG ok.   Will follow, contact with concerns.   Jannifer Hick MD 10/13/2020, 8:47 AM  [facility] Pager: [phone]

## 2020-10-13 NOTE — ED Notes (Signed)
Patient will not follow commands or answer questions. Yells out intermittently, speech incomprehensible. Patient reassured and provided with warm blankets. Room safe. Vitals stable at this time.

## 2020-10-13 NOTE — Progress Notes (Signed)
Pt became tachycardic and hypotensive during HD line placement (SBP 70s-80s HR 150s). MD at bedside, continued placing HD line. MD Aware of vital sign changes. No new orders provided at this time. Will continue to monitor    1200: MD remains at bedside and is aware of tachycardia and hypotension   1230: MD Paged for continued hypotension and tachycardia. See New orders.   1400: bolus given per orders. Pt HR back to NSR HR 80s. SBP continues to be 70s-80s. MD notified. See new orders.

## 2020-10-13 NOTE — Progress Notes (Addendum)
Date and time results received: 10/13/20 0834   Test: CMP  Critical Value: Na+ 170 / Cl >130 / Cr 9.76 / K 6.2  Name of Provider Notified: Dr. Johnney Ou MD 10/13/20 0841  Orders Received? Or Actions Taken?: Obtain consent from family for HD line and CVVHD treatment,per MD, as patient is not oriented and unable to provide consent. Orders to be placed by MD for HD line placement and CVVHD treatment.

## 2020-10-13 NOTE — Procedures (Signed)
Central Venous Catheter Insertion Procedure Note  Raymond Stone  [id number]  [birth year]  Date:10/13/20  Time:11:46 AM   Provider Performing:Zariyah Stephens Chauncey Cruel Shearon Stalls   Procedure: Insertion of Non-tunneled Central Venous Catheter(36556)with US guidance (18563)    Indication(s) Hemodialysis  Consent Unable to obtain consent due to emergent nature of procedure.  Anesthesia 1% lidocaine with epi  Timeout Verified patient identification, verified procedure, site/side was marked, verified correct patient position, special equipment/implants available, medications/allergies/relevant history reviewed, required imaging and test results available.  Sterile Technique Maximal sterile technique including full sterile barrier drape, hand hygiene, sterile gown, sterile gloves, mask, hair covering, sterile ultrasound probe cover (if used).  Procedure Description Area of catheter insertion was cleaned with chlorhexidine and draped in sterile fashion.   With real-time ultrasound guidance a HD catheter was placed into the right internal jugular vein.  Nonpulsatile blood flow and easy flushing noted in all ports.  The catheter was sutured in place and sterile dressing applied.  Complications/Tolerance None; patient tolerated the procedure well. Chest X-ray is ordered to verify placement for internal jugular or subclavian cannulation.  Chest x-ray is not ordered for femoral cannulation.  EBL Minimal  Specimen(s) None  Raymond Llamas, MD Pulmonary and [place]

## 2020-10-13 NOTE — Progress Notes (Signed)
LA resulted at 00:20, down to 5.3.

## 2020-10-13 NOTE — Progress Notes (Signed)
Pharmacy Antibiotic Note  Raymond Stone is a 70 y.o. male admitted on 10/12/2020 with sepsis.  Pharmacy has been consulted for vancomycin and zosyn dosing. WBC up to 19.5 this AM. Hypothermic. LA 3.1. Scr 9.76 with CrCl 7.8 ml/min with minimal UOP (90 cc/24 hr). Starting CRRT this morning. Will adjust antibiotics for CRRT dosing.  Already received vancomycin load and first doses of Zosyn yesterday (last given this AM at 0500)  Plan: Vancomycin 1000 mg IV Q24 hrs starting 4/4 at noon Zosyn 3.375 gm IV q6 hrs starting today at 1400 Monitor toleration of CRRT, cultures/sensitivities, clinical progression Check vancomycin levels as needed   Height: 5\' 9"  (175.3 cm) Weight: 87.9 kg (193 lb 12.6 oz) (from 2021 records) IBW/kg (Calculated) : 70.7  Temp (24hrs), Avg:97.9 F (36.6 C), Min:96.1 F (35.6 C), Max:100 F (37.8 C)  Recent Labs  Lab 10/12/20 1740 10/12/20 1854 10/12/20 2004 10/12/20 2130 10/13/20 0120 10/13/20 0256 10/13/20 0719  WBC 18.4*  --   --   --  18.7* 19.5*  --   CREATININE  --  11.25*  --  10.19*  --   --  9.76*  LATICACIDVEN 7.8*  --  5.3*  --   --  3.1*  --     Estimated Creatinine Clearance: 7.8 mL/min (A) (by C-G formula based on SCr of 9.76 mg/dL (H)).    Allergies  Allergen Reactions  . Lisinopril Swelling    ANGIOEDEMA    4/2 BCx:  Vanc 4/2>> Zosyn 4/2>>  Richardine Service, PharmD, BCPS PGY2 Cardiology Pharmacy Resident Phone: [phone] 10/13/2020  11:42 AM  Please check [email or web address] for unit-specific pharmacy phone numbers.

## 2020-10-14 DIAGNOSIS — N179 Acute kidney failure, unspecified: Secondary | ICD-10-CM | POA: Diagnosis not present

## 2020-10-14 DIAGNOSIS — E872 Acidosis: Secondary | ICD-10-CM

## 2020-10-14 DIAGNOSIS — G9349 Other encephalopathy: Secondary | ICD-10-CM | POA: Diagnosis not present

## 2020-10-14 DIAGNOSIS — M6282 Rhabdomyolysis: Secondary | ICD-10-CM | POA: Diagnosis not present

## 2020-10-14 LAB — CBC
HCT: 34.3 % — ABNORMAL LOW (ref 39.0–52.0)
Hemoglobin: 10.6 g/dL — ABNORMAL LOW (ref 13.0–17.0)
MCH: 34 pg (ref 26.0–34.0)
MCHC: 30.9 g/dL (ref 30.0–36.0)
MCV: 109.9 fL — ABNORMAL HIGH (ref 80.0–100.0)
Platelets: 289 10*3/uL (ref 150–400)
RBC: 3.12 MIL/uL — ABNORMAL LOW (ref 4.22–5.81)
RDW: 14.8 % (ref 11.5–15.5)
WBC: 13.5 10*3/uL — ABNORMAL HIGH (ref 4.0–10.5)
nRBC: 0.1 % (ref 0.0–0.2)

## 2020-10-14 LAB — GLUCOSE, CAPILLARY
Glucose-Capillary: 118 mg/dL — ABNORMAL HIGH (ref 70–99)
Glucose-Capillary: 131 mg/dL — ABNORMAL HIGH (ref 70–99)
Glucose-Capillary: 136 mg/dL — ABNORMAL HIGH (ref 70–99)
Glucose-Capillary: 267 mg/dL — ABNORMAL HIGH (ref 70–99)
Glucose-Capillary: 80 mg/dL (ref 70–99)
Glucose-Capillary: 80 mg/dL (ref 70–99)
Glucose-Capillary: 82 mg/dL (ref 70–99)
Glucose-Capillary: 90 mg/dL (ref 70–99)
Glucose-Capillary: 93 mg/dL (ref 70–99)

## 2020-10-14 LAB — RENAL FUNCTION PANEL
Albumin: 1.5 g/dL — ABNORMAL LOW (ref 3.5–5.0)
Albumin: 1.5 g/dL — ABNORMAL LOW (ref 3.5–5.0)
Albumin: 1.4 g/dL — ABNORMAL LOW (ref 3.5–5.0)
Anion gap: 12 (ref 5–15)
Anion gap: 11 (ref 5–15)
Anion gap: 10 (ref 5–15)
BUN: 129 mg/dL — ABNORMAL HIGH (ref 8–23)
BUN: 106 mg/dL — ABNORMAL HIGH (ref 8–23)
BUN: 102 mg/dL — ABNORMAL HIGH (ref 8–23)
CO2: 21 mmol/L — ABNORMAL LOW (ref 22–32)
CO2: 23 mmol/L (ref 22–32)
CO2: 23 mmol/L (ref 22–32)
Calcium: 8.3 mg/dL — ABNORMAL LOW (ref 8.9–10.3)
Calcium: 8.3 mg/dL — ABNORMAL LOW (ref 8.9–10.3)
Calcium: 8.4 mg/dL — ABNORMAL LOW (ref 8.9–10.3)
Chloride: 123 mmol/L — ABNORMAL HIGH (ref 98–111)
Chloride: 120 mmol/L — ABNORMAL HIGH (ref 98–111)
Chloride: 120 mmol/L — ABNORMAL HIGH (ref 98–111)
Creatinine, Ser: 5.96 mg/dL — ABNORMAL HIGH (ref 0.61–1.24)
Creatinine, Ser: 4.79 mg/dL — ABNORMAL HIGH (ref 0.61–1.24)
Creatinine, Ser: 4.78 mg/dL — ABNORMAL HIGH (ref 0.61–1.24)
GFR, Estimated: 10 mL/min — ABNORMAL LOW (ref >60)
GFR, Estimated: 12 mL/min — ABNORMAL LOW (ref >60)
GFR, Estimated: 12 mL/min — ABNORMAL LOW (ref >60)
Glucose, Bld: 111 mg/dL — ABNORMAL HIGH (ref 70–99)
Glucose, Bld: 103 mg/dL — ABNORMAL HIGH (ref 70–99)
Glucose, Bld: 112 mg/dL — ABNORMAL HIGH (ref 70–99)
Phosphorus: 4.1 mg/dL (ref 2.5–4.6)
Phosphorus: 4.3 mg/dL (ref 2.5–4.6)
Phosphorus: 3.7 mg/dL (ref 2.5–4.6)
Potassium: 4.9 mmol/L (ref 3.5–5.1)
Potassium: 4.8 mmol/L (ref 3.5–5.1)
Potassium: 4.7 mmol/L (ref 3.5–5.1)
Sodium: 156 mmol/L — ABNORMAL HIGH (ref 135–145)
Sodium: 154 mmol/L — ABNORMAL HIGH (ref 135–145)
Sodium: 153 mmol/L — ABNORMAL HIGH (ref 135–145)

## 2020-10-14 LAB — MAGNESIUM: Magnesium: 2.7 mg/dL — ABNORMAL HIGH (ref 1.7–2.4)

## 2020-10-14 MED ORDER — ACETAMINOPHEN 650 MG RE SUPP
650.0000 mg | RECTAL | Status: DC | PRN
  Filled 2020-10-14: qty 1

## 2020-10-14 MED ORDER — DEXTROSE IN LACTATED RINGERS 5 % IV SOLN: INTRAVENOUS | Status: DC

## 2020-10-14 MED ORDER — SODIUM CHLORIDE 0.9 % IV BOLUS
500.0000 mL | Freq: Once | INTRAVENOUS | Status: AC
  Administered 2020-10-14: 500 mL via INTRAVENOUS

## 2020-10-14 MED ORDER — PIPERACILLIN-TAZOBACTAM 3.375 G IVPB
3.3750 g | Freq: Two times a day (BID) | INTRAVENOUS | Status: DC
  Administered 2020-10-14 – 2020-10-16 (×4): 3.375 g via INTRAVENOUS
  Filled 2020-10-14 (×3): qty 50

## 2020-10-14 MED ORDER — LACTATED RINGERS IV SOLN: INTRAVENOUS | Status: DC

## 2020-10-14 MED ORDER — LACTATED RINGERS IV BOLUS
500.0000 mL | Freq: Once | INTRAVENOUS | Status: AC
  Administered 2020-10-14: 500 mL via INTRAVENOUS

## 2020-10-14 MED ORDER — ADULT MULTIVITAMIN W/MINERALS CH
1.0000 | ORAL_TABLET | Freq: Every day | ORAL | Status: DC
  Administered 2020-10-14 – 2020-10-25 (×7): 1 via ORAL
  Filled 2020-10-14 (×10): qty 1

## 2020-10-14 MED ORDER — COLLAGENASE 250 UNIT/GM EX OINT
TOPICAL_OINTMENT | Freq: Every day | CUTANEOUS | Status: AC
  Filled 2020-10-14 (×5): qty 30

## 2020-10-14 MED ORDER — ENSURE ENLIVE PO LIQD
237.0000 mL | Freq: Two times a day (BID) | ORAL | Status: DC
  Administered 2020-10-15 – 2020-10-16 (×3): 237 mL via ORAL

## 2020-10-14 MED ORDER — ACETAMINOPHEN 160 MG/5ML PO SOLN: 650.0000 mg | ORAL | Status: DC | PRN

## 2020-10-14 NOTE — Progress Notes (Signed)
[place] Progress Note Patient Name: Raymond Stone DOB: December 23, 1950 MRN: [id number]   Date of Service  10/14/2020  HPI/Events of Note  Patient with fever, temperature up to 103 degrees, patient has recent blood cultures, bedside RN asking for PRN Tylenol.  eICU Interventions  PRN Tylenol ordered.        Kerry Kass Jaiana Sheffer 10/14/2020, 8:29 PM

## 2020-10-14 NOTE — Progress Notes (Signed)
NAME:  Raymond Stone, MRN:  [id number], DOB:  12-May-1951, LOS: 2 ADMISSION DATE:  10/12/2020, CONSULTATION DATE:  10/12/20 REFERRING MD: Dr. Joya Gaskins ED, CHIEF COMPLAINT:  Altered, found down  History of Present Illness:  Mr. Savo in a 70 yo man with hx of HTN, lives alone, found down after not being seen since Monday (5 days).  EMS was not able to initially get a bp.   Fever - temp 100.  On NRB initially, on room air at my exam Has received 2.7 L NS.  Now on 250cc/hr ns.    In Ed: got 1 g ca gluc, bicarb 1 amp, insulin 10u, D50  830pm Vanc and zosyn ordered.   Pertinent  Medical History  HTN Peripheral edema Hx of Angioedema Hx of AKI Hx of anemia Eczema CHF (grade 1, LVH by 2017 echo) Etoh 3-4 per week  Meds: norvasc, desowen, colace, metoprolol 25 tid,  Allergies lisinopril    Significant Hospital Events: Including procedures, antibiotic start and stop dates in addition to other pertinent events   . 4/2 Admitted overnight . 4/3 started CRRT . 4/4 CRRT discontinued this am due to pressure alarming  Interim History / Subjective:  Drowsy but answers questions appropriate when stimulated. CRRT discontinued this am due to pressure alarming. Nephrology aware  Objective   Blood pressure 109/62, pulse (!) 55, temperature (!) 96.9 F (36.1 C), temperature source Axillary, resp. rate 20, height 5\' 9"  (1.753 m), weight 76.7 kg, SpO2 97 %.        Intake/Output Summary (Last 24 hours) at 10/14/2020 0946 Last data filed at 10/14/2020 0900 Gross per 24 hour  Intake 3963.39 ml  Output 2228 ml  Net 1735.39 ml   Filed Weights   10/13/20 0300 10/13/20 1315 10/14/20 0500  Weight: 87.9 kg (S) 76.2 kg 76.7 kg   Physical Exam: General: Chronically ill appearing-appearing, no acute distress, very drowsy HENT: , AT, OP clear, MMM Eyes: EOMI, no scleral icterus Respiratory: Scattered rhonchi and course breath sounds bilaterally.  No wheezing  Cardiovascular: RRR, -M/R/G, no JVD GI:  BS+, soft, nontender Extremities:-Edema,-tenderness Neuro: Does not open eyes, drowsy, weakly follows commands bilaterally in extremities x 4 GU: Foley in place with clear yellow urine   Labs/imaging that I havepersonally reviewed  (right click and "Reselect all SmartList Selections" daily)   Na 165>156 Cl >130>123 CO2 21 BUN/Cr 129/5.96  WBC 13.5  4/3 CK 2415 LA 3.1  CT head: IMPRESSION: 1. No acute intracranial findings. 2. Progressive atrophy and white matter microvascular disease compared to CT exam 2014.  EKG reviewed  2017 echo  Impressions:   - LVEF 60-65%, mild LVH, normal wall motion, diastolic dysfunction  with elevated LV filling pressure, normal GLPSS at -21%, trivial  MR, moderate LAE, mild TR, top normal RVSP of 33 mmHG, normal  IVC, mobile IAS - cannot exclude small PFO.   Imaging, labs and test noted above and in last 24 hours have been reviewed independently by me.  Resolved Hospital Problem list     Assessment & Plan:  Raymond Stone is a 70 y.o. gentleman who was found in his home after well check - no one had heard from him in over 5 days.  Acute metabolic encephalopathy - slightly improving Suspect uremic encephalopathy - Continue dialysis as able - Trend BMET  Acute Oliguric Kidney Injury - Nephrology consulted for CRRT for electrolyte abnormalities - Maintenance IVF for volume depletion - Monitor UOP/BMET  Hypovolemic shock - De-escalate antibiotics to Zosyn -  Follow-up final culture data - Continue low dose pressor. Currently on 76mcg/h levophed - Goal MAP >65   Rhabdomyolysis - Maintenance IVF for volume depletion -Trend CK - Prn fentanyl for pain  Hypernatremia, Hyperkalemia, Hyperchloremia - improving with fluids and CRRT - Follow-up BMET - Consider restarting CRRT if indicated pending labs  Anion Gap Metabolic Acidosis - improved on CRRT Secondary to uremia - CRRT and IVF as above  HTN - hold home meds.    Severe protein calorie malnutrition - albumin <2 - will need to consider tube feeds if not waking up enough to eat soon  Eczema Barrier cream applied Continue moisturizer May need topical steroids  Best practice (right click and "Reselect all SmartList Selections" daily)  Diet:  NPO Pain/Anxiety/Delirium protocol (if indicated): Wean Precedex. fentanyl for pain VAP protocol (if indicated): Not indicated DVT prophylaxis: Subcutaneous Heparin GI prophylaxis: Not indicated Glucose control:  SSI No Central venous access:  N/A Arterial line:  N/A Foley:  Yes for uop Mobility:  bed rest  PT consulted: N/A Last date of multidisciplinary goals of care discussion []  Code Status:  full code Disposition: ICU   Labs   CBC: Recent Labs  Lab 10/12/20 1740 10/12/20 2136 10/13/20 0120 10/13/20 0256 10/14/20 0210  WBC 18.4*  --  18.7* 19.5* 13.5*  NEUTROABS 14.8*  --   --  15.7*  --   HGB 15.0 11.2* 11.1* 12.1* 10.6*  HCT 49.9 33.0* 37.6* 39.0 34.3*  MCV 111.6*  --  115.3* 109.2* 109.9*  PLT 523*  --  346 320 628    Basic Metabolic Panel: Recent Labs  Lab 10/12/20 1854 10/12/20 2130 10/12/20 2136 10/13/20 0256 10/13/20 0535 10/13/20 0719 10/13/20 1559 10/14/20 0210  NA 172* 171* 173*  --   --  170* 165* 156*  K 7.3* 5.1 6.0*  --  5.9* 6.2* 5.7* 4.9  CL >130* >130*  --   --   --  >130* >130* 123*  CO2 15* 19*  --   --   --  16* 19* 21*  GLUCOSE 156* 170*  --   --   --  154* 105* 111*  BUN 211* 198*  --   --   --  195* 170* 129*  CREATININE 11.25* 10.19*  --   --   --  9.76* 8.09* 5.96*  CALCIUM 10.9* 9.7  --   --   --  8.7* 8.3* 8.3*  MG  --   --   --  3.9*  --   --   --  2.7*  PHOS  --   --   --   --   --   --  5.2* 4.1   GFR: Estimated Creatinine Clearance: 11.7 mL/min (A) (by C-G formula based on SCr of 5.96 mg/dL (H)). Recent Labs  Lab 10/12/20 1740 10/12/20 2004 10/13/20 0120 10/13/20 0256 10/14/20 0210  WBC 18.4*  --  18.7* 19.5* 13.5*  LATICACIDVEN  7.8* 5.3*  --  3.1*  --     Liver Function Tests: Recent Labs  Lab 10/12/20 1854 10/13/20 0719 10/13/20 1559 10/14/20 0210  AST 118* 101*  --   --   ALT 42 33  --   --   ALKPHOS 61 43  --   --   BILITOT 1.5* 1.1  --   --   PROT 9.1* 6.3*  --   --   ALBUMIN 2.2* 1.6* 1.4* 1.5*   No results for input(s): LIPASE, AMYLASE in the last  168 hours. Recent Labs  Lab 10/12/20 1826  AMMONIA 46*    ABG    Component Value Date/Time   PHART 7.370 10/12/2020 2136   PCO2ART 26.0 (L) 10/12/2020 2136   PO2ART 96 10/12/2020 2136   HCO3 15.0 (L) 10/12/2020 2136   TCO2 16 (L) 10/12/2020 2136   ACIDBASEDEF 9.0 (H) 10/12/2020 2136   O2SAT 97.0 10/12/2020 2136     Coagulation Profile: Recent Labs  Lab 10/12/20 1740  INR 1.6*    Cardiac Enzymes: Recent Labs  Lab 10/12/20 1826 10/13/20 0256  CKTOTAL 2,442* 2,415*    HbA1C: Hemoglobin A1C  Date/Time Value Ref Range Status  01/23/2020 09:16 AM 5.3 4.0 - 5.6 % Final  01/31/2014 11:43 AM 5.8  Final   Hgb A1c MFr Bld  Date/Time Value Ref Range Status  05/22/2017 02:02 PM 6.0 (H) 4.8 - 5.6 % Final    Comment:    (NOTE) Pre diabetes:          5.7%-6.4% Diabetes:              >6.4% Glycemic control for   <7.0% adults with diabetes     CBG: Recent Labs  Lab 10/13/20 2006 10/14/20 0008 10/14/20 0437 10/14/20 0439 10/14/20 0748  GLUCAP 82 80 267* 93 90    The patient is critically ill with multiple organ systems failure and requires high complexity decision making for assessment and support, frequent evaluation and titration of therapies, application of advanced monitoring technologies and extensive interpretation of multiple databases.  Independent Critical Care Time: 32 Minutes.   Rodman Pickle, M.D. [facility] Pulmonary/Critical Care Medicine 10/14/2020 9:46 AM   Please see Amion for pager number to reach on-call Pulmonary and Critical Care Team.

## 2020-10-14 NOTE — Progress Notes (Signed)
[facility] Progress Note   Subjective:   Slightly improved mental status.  Made 1.4L urine yesterday.  Labs much improved.  CRRT cartridge had to be changed 3 times in last 24 hrs.    Objective Vitals:   10/14/20 0751 10/14/20 0800 10/14/20 0900 10/14/20 1000  BP:  110/60 109/62 114/68  Pulse:      Resp:  (!) 23 20 (!) 25  Temp: (!) 96.9 F (36.1 C)     TempSrc: Axillary     SpO2:  98% 97% 97%  Weight:      Height:       Physical Exam General: remains lethargic  ENT: MM dry Heart: tachy but regular, no rub Lungs: clear  Abdomen:soft Extremities: no edema Dialysis Access: R IJ temp cath GU: + yellow urine SKIN: + tenting  Additional Objective Labs: Basic Metabolic Panel: Recent Labs  Lab 10/13/20 1559 10/14/20 0210 10/14/20 0904  NA 165* 156* 154*  K 5.7* 4.9 4.8  CL >130* 123* 120*  CO2 19* 21* 23  GLUCOSE 105* 111* 103*  BUN 170* 129* 106*  CREATININE 8.09* 5.96* 4.79*  CALCIUM 8.3* 8.3* 8.3*  PHOS 5.2* 4.1 4.3   Liver Function Tests: Recent Labs  Lab 10/12/20 1854 10/13/20 0719 10/13/20 1559 10/14/20 0210 10/14/20 0904  AST 118* 101*  --   --   --   ALT 42 33  --   --   --   ALKPHOS 61 43  --   --   --   BILITOT 1.5* 1.1  --   --   --   PROT 9.1* 6.3*  --   --   --   ALBUMIN 2.2* 1.6* 1.4* 1.5* 1.5*   No results for input(s): LIPASE, AMYLASE in the last 168 hours. CBC: Recent Labs  Lab 10/12/20 1740 10/12/20 2136 10/13/20 0120 10/13/20 0256 10/14/20 0210  WBC 18.4*  --  18.7* 19.5* 13.5*  NEUTROABS 14.8*  --   --  15.7*  --   HGB 15.0   < > 11.1* 12.1* 10.6*  HCT 49.9   < > 37.6* 39.0 34.3*  MCV 111.6*  --  115.3* 109.2* 109.9*  PLT 523*  --  346 320 289   < > = values in this interval not displayed.   Blood Culture    Component Value Date/Time   SDES BLOOD SITE NOT SPECIFIED 10/12/2020 1818   SPECREQUEST  10/12/2020 1818    BOTTLES DRAWN AEROBIC AND ANAEROBIC Blood Culture adequate volume   CULT  10/12/2020 1818     NO GROWTH < 24 HOURS Performed at [facility], [place] [address], [place], [place] [address]    REPTSTATUS PENDING 10/12/2020 1818    Cardiac Enzymes: Recent Labs  Lab 10/12/20 1826 10/13/20 0256  CKTOTAL 2,442* 2,415*   CBG: Recent Labs  Lab 10/13/20 2006 10/14/20 0008 10/14/20 0437 10/14/20 0439 10/14/20 0748  GLUCAP 82 80 267* 93 90   Iron Studies: No results for input(s): IRON, TIBC, TRANSFERRIN, FERRITIN in the last 72 hours. @lablastinr3 @ Studies/Results: CT Head Wo Contrast  Result Date: 10/12/2020 CLINICAL DATA:  Mental status change EXAM: CT HEAD WITHOUT CONTRAST TECHNIQUE: Contiguous axial images were obtained from the base of the skull through the vertex without intravenous contrast. COMPARISON:  04/20/2013 FINDINGS: Brain: No acute intracranial hemorrhage. No focal mass lesion. No CT evidence of acute infarction. No midline shift or mass effect. No hydrocephalus. Basilar cisterns are patent. There are periventricular and subcortical white  matter hypodensities. Generalized cortical atrophy. Vascular: No hyperdense vessel or unexpected calcification. Skull: Normal. Negative for fracture or focal lesion. Sinuses/Orbits: Paranasal sinuses and mastoid air cells are clear. Orbits are clear. Other: None. IMPRESSION: 1. No acute intracranial findings. 2. Progressive atrophy and white matter microvascular disease compared to CT exam 2014. Electronically Signed   By: Suzy Bouchard M.D.   On: 10/12/2020 18:55   US RENAL  Result Date: 10/12/2020 CLINICAL DATA:  Acute kidney injury EXAM: RENAL / URINARY TRACT ULTRASOUND COMPLETE COMPARISON:  None. FINDINGS: Right Kidney: Renal measurements: 10.1 x 6.2 x 5.5 cm = volume: 182 mL. 3 cm cyst in the lower pole. Mildly increased echotexture. No hydronephrosis. Left Kidney: Renal measurements: 9.9 x 5.8 x 4.5 cm. = volume: 134 mL. Increased echotexture. No mass or hydronephrosis. Bladder: Decompressed, Foley catheter in place. Other:  None. IMPRESSION: Increased echotexture compatible with chronic medical renal disease. No hydronephrosis. Electronically Signed   By: Rolm Baptise M.D.   On: 10/12/2020 23:36   DG CHEST PORT 1 VIEW  Result Date: 10/13/2020 CLINICAL DATA:  Central line placement. EXAM: PORTABLE CHEST 1 VIEW COMPARISON:  Radiographs 10/12/2020 and 05/25/2017. FINDINGS: 1207 hours. Right IJ central venous catheter projects to the level of the upper SVC. The heart size and mediastinal contours are stable. Probable mild atelectasis at the right lung base, similar to prior studies. The lungs are otherwise clear. There is no pleural effusion or pneumothorax. Stable chronic metallic foreign body in the right lateral chest wall. No acute osseous findings. IMPRESSION: Central venous catheter placement as described. No evidence of pneumothorax. Electronically Signed   By: Richardean Sale M.D.   On: 10/13/2020 12:38   DG Chest Port 1 View  Result Date: 10/12/2020 CLINICAL DATA:  Fever, altered mental status EXAM: PORTABLE CHEST 1 VIEW COMPARISON:  None. FINDINGS: Normal mediastinum and cardiac silhouette. Normal pulmonary vasculature. No evidence of effusion, infiltrate, or pneumothorax. No acute bony abnormality. IMPRESSION: No acute cardiopulmonary process. Electronically Signed   By: Suzy Bouchard M.D.   On: 10/12/2020 19:00   Medications: . sodium chloride Stopped (10/14/20 0859)  . dexmedetomidine (PRECEDEX) IV infusion Stopped (10/14/20 0911)  . lactated ringers    . lactated ringers 75 mL/hr at 10/14/20 1001  . norepinephrine (LEVOPHED) Adult infusion 1 mcg/min (10/14/20 1000)  . piperacillin-tazobactam Stopped (10/14/20 7341)  . prismasol BGK 2/2.5 replacement solution 300 mL/hr at 10/13/20 1330  . prismasol BGK 2/2.5 replacement solution 300 mL/hr at 10/13/20 1334  . prismasol BGK 4/2.5 1,000 mL/hr at 10/14/20 0445   . chlorhexidine  15 mL Mouth Rinse BID  . Chlorhexidine Gluconate Cloth  6 each Topical Q0600  .  heparin  5,000 Units Subcutaneous Q8H  . mouth rinse  15 mL Mouth Rinse q12n4p  . sodium chloride flush  10-40 mL Intracatheter Q12H   Assessment/Plan **Shock:  Hypovolemic, suspect septic as well.  Volume resuscitation underway and broad spectrum antibiotics (vanc/ zosyn)   Cultures are pending.   **AKI, severe: Cr 1.14 in 01/2020; on presentation 11.  Presumably hypovolemic that's progressed to ATN; CK modestly elevated which isn't helping. Renal US without acute issue.  Has improved markedly with volume resuscitation and < 24 hrs CRRT.  Making urine.  On maintenance IVFs.  Will trend labs for the next several hours with UOP--> if continues to improve may not need further CRRT.  Watching volume status closely too  **Hyperkalemia: severe,  Has been managed medically with insulin/dextrose/bicarb/ca and volume expansion.  now normokalemic  **  Hypernatremia,severe: no po intake x days.  on LR volume expansion and D5W.  Trend.  Generous correction over 24 hrs---> trend carefully   **AGMA: AKI and lactate.  Care per above. PH on ABG ok.   Will follow, contact with concerns.   Madelon Lips MD 10/14/2020, 10:56 AM  [facility] Pager: [phone]

## 2020-10-14 NOTE — Evaluation (Signed)
Clinical/Bedside Swallow Evaluation Patient Details  Name: Raymond Stone MRN: [id number] Date of Birth: 12-18-1950  Today's Date: 10/14/2020 Time: SLP Start Time (ACUTE ONLY): 1500 SLP Stop Time (ACUTE ONLY): 1515 SLP Time Calculation (min) (ACUTE ONLY): 15 min  Past Medical History:  Past Medical History:  Diagnosis Date  . Anemia   . Eczema   . Fluid collection (edema) in the arms, legs, hands and feet   . Hypertension    Past Surgical History:  Past Surgical History:  Procedure Laterality Date  . COLONOSCOPY    . FINGER SURGERY Right 2013  . I & D EXTREMITY Right 09/13/2012   Procedure: IRRIGATION AND DEBRIDEMENT EXTREMITY  RIGHT MIDDLE FINGER WITH REVISION AMPUTATION AND SKIN GRAFTING.;  Surgeon: Roseanne Kaufman, MD;  Location: [facility];  Service: Orthopedics;  Laterality: Right;   HPI:  70 yo man with hx of HTN, lives alone, found down after not being seen for five days and admitted 4/2. Dx acute metabolic encephalopathy, acute kidney injury, hypovolemic shock, rhabdomylosis.   Assessment / Plan / Recommendation Clinical Impression  Pt was alert, followed commands inconsistently, but was able to participate in clinical swallow assessment.  He was reluctant to allow oral care, which has been the case per his RN.  Attempted oral cleaning but pt would not open his mouth nor allow suctioning.  He was repositioned with RN assist and provided with ice chips, which he masticated without difficulty.  He progressed to sips of water from the cup/straw and bites of pudding. He consumed these with exclamations of relief. There were no s/s of aspiration throughout assessment, and oral manipulation/timeliness of swallow subjectively improved as session progressed. Pt had some difficulty shifting his motor pattern between eating and drinking - this should improve as MS clears and as he begins to eat more.  Recommend starting a dysphagia 1 diet with thin liquids; crush meds with puree. Pt will need full  assistance with feeding at this time. SLP will follow for safety and diet advancement. SLP Visit Diagnosis: Dysphagia, unspecified (R13.10)    Aspiration Risk    tba   Diet Recommendation   dysphagia 1, thin liquids  Medication Administration: Crushed with puree    Other  Recommendations Oral Care Recommendations: Oral care BID   Follow up Recommendations Other (comment) (tba)      Frequency and Duration min 2x/week  2 weeks       Prognosis Prognosis for Safe Diet Advancement: Good      Swallow Study   General Date of Onset: 10/12/20 HPI: 70 yo man with hx of HTN, lives alone, found down after not being seen for five days and admitted 4/2. Dx acute metabolic encephalopathy, acute kidney injury, hypovolemic shock, rhabdomylosis. Type of Study: Bedside Swallow Evaluation Previous Swallow Assessment: no Diet Prior to this Study: NPO Temperature Spikes Noted:  (96.6) Respiratory Status: Nasal cannula (4L) History of Recent Intubation: No Behavior/Cognition: Alert;Requires cueing Oral Cavity Assessment: Other (comment) (difficult to assess) Oral Care Completed by SLP: Other (Comment) (Attempted, pt resistant) Oral Cavity - Dentition: Poor condition;Missing dentition Self-Feeding Abilities: Total assist Patient Positioning: Partially reclined Baseline Vocal Quality: Low vocal intensity Volitional Cough: Cognitively unable to elicit Volitional Swallow: Unable to elicit    Oral/Motor/Sensory Function Overall Oral Motor/Sensory Function: Other (comment) (symmetric at rest; difficulty f/c)   Ice Chips Ice chips: Within functional limits   Thin Liquid Thin Liquid: Within functional limits    Nectar Thick Nectar Thick Liquid: Not tested   Honey Thick  Honey Thick Liquid: Not tested   Puree Puree: Within functional limits Presentation: Spoon   Solid     Solid: Not tested      Juan Quam Laurice 10/14/2020,3:51 PM   Estill Bamberg L. Tivis Ringer, [place] Office number [phone] Pager [phone]

## 2020-10-14 NOTE — Progress Notes (Signed)
Pharmacy Antibiotic Note  Raymond Stone is a 70 y.o. male admitted on 10/12/2020 with sepsis.  Pharmacy has been consulted for vancomycin and zosyn dosing.   WBC 13, afebrile. Scr improving down to 4.78 (has been off CRRT this morning). CK last check 2415. LA 3.1 on last check. Discussed with CCM - okay to stop vancomycin. Given off zosyn, will adjust frequency.   Plan: Stop vancomycin Zosyn 3.375 gm IV q12 hrs  Monitor cultures/sensitivities, clinical progression, nephro plans  Height: 5\' 9"  (175.3 cm) Weight: 76.7 kg (169 lb 1.5 oz) IBW/kg (Calculated) : 70.7  Temp (24hrs), Avg:97.9 F (36.6 C), Min:96.9 F (36.1 C), Max:98.6 F (37 C)  Recent Labs  Lab 10/12/20 1740 10/12/20 1854 10/12/20 2004 10/12/20 2130 10/13/20 0120 10/13/20 0256 10/13/20 0719 10/13/20 1559 10/14/20 0210 10/14/20 0904 10/14/20 1435  WBC 18.4*  --   --   --  18.7* 19.5*  --   --  13.5*  --   --   CREATININE  --    < >  --    < >  --   --  9.76* 8.09* 5.96* 4.79* 4.78*  LATICACIDVEN 7.8*  --  5.3*  --   --  3.1*  --   --   --   --   --    < > = values in this interval not displayed.    Estimated Creatinine Clearance: 14.6 mL/min (A) (by C-G formula based on SCr of 4.78 mg/dL (H)).    Allergies  Allergen Reactions  . Lisinopril Swelling    ANGIOEDEMA   Antimicrobials this admission:  Vanc 4/2>> 4/4 Zosyn 4/2>>  Dose adjustments this admission:  N/A  Microbiology results:  4/2 Bcx: ngtd  4/3 MRSA PCR: neg   Antonietta Jewel, PharmD, BCCCP Clinical Pharmacist  Phone: [phone] 10/14/2020 3:37 PM  Please check AMION for all [facility] phone numbers After 10:00 PM, call [place] [phone]

## 2020-10-14 NOTE — Consult Note (Signed)
WOC Nurse Consult Note: Reason for Consult:S/P fall at home with prolonged time "down"  Left lateral thigh and left lateral chest with most significant wounds.  Unstageable pressure injury to right heel and numerous intact DTI lesions of intact darkened epithelium.   Wound type:trauma/pressure wounds from prolonged period down.   Pressure Injury POA: Yes Measurement: Left lateral thigh:  15 cm x 7 cm with 100% devitalized tissue to wound bed.  Left lateral chest: 1 cm x 1.5 cm with depth of 0.2 cm 1 cm circumferential mottling and wrinkling of tissue, consistent with evolving damage.  Right heel:  1 cm x 1 cm scabbed lesion Bilateral knees with deep tissue injury 3 cm x 2 cm intact maroon discoloration.  Center of right knee lesion is nonintact Anasarca present. Bilateral heels offloaded with Prevalon boots.  Patient is on mattress with low air loss feature.  Will implement hydrotherapy and Santyl to aid in debriding sloughing devitalized tissue  Wound bed: dark devitalized tissue to left lateral thigh and left flank wounds Red and moist to clean wounds Drainage (amount, consistency, odor) Minimal serosanguinous   No odor.  Periwound:Mottling periwound wound, indicative of evolving damage.  Dressing procedure/placement/frequency: Recommend hydrotherapy consult. Cleanse wounds to left lateral thigh and chest with NS and pat dry.  Apply Santyl to open wounds.  Cover with NS moist gauze and top with dry dressing and foam. Change daily.  Will follow.   Domenic Moras MSN, RN, FNP-BC CWON Wound, Ostomy, Continence Nurse Pager [phone]

## 2020-10-14 NOTE — Progress Notes (Signed)
Initial Nutrition Assessment  DOCUMENTATION CODES:   Non-severe (moderate) malnutrition in context of chronic illness  INTERVENTION:   Recommend Cortrak Wednesday if intake unable to progress given malnutrition and wound status   Ensure Enlive po BID, each supplement provides 350 kcal and 20 grams of protein  Magic cup TID with meals, each supplement provides 290 kcal and 9 grams of protein  MVI daily   Check thiamine, Vitamin C, and zinc labs   NUTRITION DIAGNOSIS:   Moderate Malnutrition related to chronic illness (CHF) as evidenced by mild fat depletion,moderate muscle depletion.  GOAL:   Patient will meet greater than or equal to 90% of their needs  MONITOR:   PO intake,Supplement acceptance,Weight trends,Labs,I & O's,Diet advancement,Skin  REASON FOR ASSESSMENT:   Malnutrition Screening Tool    ASSESSMENT:   Patient with PMH significant for HTN, peripheral edema, CHF, and alcohol use disorder. Presents this admission with suspected uremic encephalopathy and AKI.   4/3- start CRRT 4/4- stop CRRT due to pressure alarming  Pt discussed during ICU rounds and with RN.   Holding off on restarting CRRT given increased UOP. Patient unable to provide history given mental status. SLP recommends pureed diet. Per RN, patient unlikely to meet nutrition requirement PO. Will provide supplements and plan for Cortrak Wednesday if mental status/intake does not improve.   Records indicate patient weighed 193 lb on 01/23/20 and 167 lb this admission. Suspect significant amount of dry weight loss but unable to differentiate dry weight loss vs fluid fluctuation given history of CHF. Patient showed moderate fat/muscle depletion, suspect fluid is masking further loss.   UOP: 1455 ml x 24 hrs  CRRT: 698 ml x 24 hrs   Drips: D5 in LR @ 75 ml/hr  Labs: Na 153 (H) Cr 4.78- stable from yesterday CBG 78-170  NUTRITION - FOCUSED PHYSICAL EXAM:  Flowsheet Row Most Recent Value  Orbital  Region Mild depletion  Upper Arm Region Mild depletion  Thoracic and Lumbar Region Unable to assess  Buccal Region Mild depletion  Temple Region Moderate depletion  Clavicle Bone Region Moderate depletion  Clavicle and Acromion Bone Region Moderate depletion  Scapular Bone Region Unable to assess  Dorsal Hand Mild depletion  Patellar Region Moderate depletion  Anterior Thigh Region Moderate depletion  Posterior Calf Region Moderate depletion  Edema (RD Assessment) Moderate  [generalized]  Hair Reviewed  Eyes Unable to assess  Mouth Unable to assess  Skin Reviewed  Nails Reviewed     Diet Order:   Diet Order            Diet NPO time specified  Diet effective now                 EDUCATION NEEDS:   Not appropriate for education at this time  Skin:  Skin Assessment: Skin Integrity Issues: Skin Integrity Issues:: DTI,Other (Comment),Stage I,Stage II DTI: L flank, R toe, bilateral ankles, bilateral knees Stage I: R flank Stage II: R knee, L thigh Other: skin tear scrotum  Last BM:  4/3  Height:   Ht Readings from Last 1 Encounters:  10/13/20 5\' 9"  (1.753 m)    Weight:   Wt Readings from Last 1 Encounters:  10/14/20 76.7 kg    BMI:  Body mass index is 24.97 kg/m.  Estimated Nutritional Needs:   Kcal:  2300-2500 kcal  Protein:  115-130 grams  Fluid:  >/= 2 L/day  Mariana Single RD, LDN Clinical Nutrition Pager listed in [place]

## 2020-10-14 NOTE — Progress Notes (Signed)
NAME:  Raymond Stone, MRN:  [id number], DOB:  1950-08-28, LOS: 2 ADMISSION DATE:  10/12/2020, CONSULTATION DATE:  10/12/20 REFERRING MD: Dr. Joya Gaskins ED, CHIEF COMPLAINT:  Altered, found down  History of Present Illness:  Raymond Stone in a 70 yo man with hx of HTN, lives alone, found down after not being seen since Monday (5 days).  EMS was not able to initially get a bp.   Fever - temp 100.  On NRB initially, on room air at my exam Has received 2.7 L NS.  Now on 250cc/hr ns.    In Ed: got 1 g ca gluc, bicarb 1 amp, insulin 10u, D50  830pm Vanc and zosyn ordered.   Pertinent  Medical History  HTN Peripheral edema Hx of Angioedema Hx of AKI Hx of anemia Eczema CHF (grade 1, LVH by 2017 echo) Etoh 3-4 per week  Meds: norvasc, desowen, colace, metoprolol 25 tid,  Allergies lisinopril    Significant Hospital Events: Including procedures, antibiotic start and stop dates in addition to other pertinent events   . 4/2 Admitted overnight . 4/3 started CRRT  Interim History / Subjective:  Receiving IVF at 250/hr. Minimal uop. Lethargic, withdraws to pain, does not follow commands. Needing precedex for agitation.   Objective   Blood pressure 109/62, pulse (!) 55, temperature (!) 96.9 F (36.1 C), temperature source Axillary, resp. rate 20, height 5\' 9"  (1.753 m), weight 76.7 kg, SpO2 97 %.        Intake/Output Summary (Last 24 hours) at 10/14/2020 0929 Last data filed at 10/14/2020 0900 Gross per 24 hour  Intake 3963.39 ml  Output 2228 ml  Net 1735.39 ml   Filed Weights   10/13/20 0300 10/13/20 1315 10/14/20 0500  Weight: 87.9 kg (S) 76.2 kg 76.7 kg    Examination: General: NAD, awake but drowsy, speaking, but not answering questions, tangential.  HENT: dry mmm.   Lungs: Clear lungs Cardiovascular: RRR no mgr Abdomen: nt, nd, nbs Extremities: dry, no edema,  Neuro: somnolent, withdraws to pain, does not follow cmomands, no asterixis MSK: +skin tenting present, diffuse  exzematous changes throughout, skin break down on scrotum  Labs/imaging that I havepersonally reviewed  (right click and "Reselect all SmartList Selections" daily)   CMP Trop 489 Lactic acid 7.8 CPK 2442 WBC 18.4 INR 1.6 UA 21-50 WBC, small leuks, 0-5 squames Blood culture drawn  CXR Nl  CT head: IMPRESSION: 1. No acute intracranial findings. 2. Progressive atrophy and white matter microvascular disease compared to CT exam 2014.  EKG reviewed  2017 echo  Impressions:   - LVEF 60-65%, mild LVH, normal wall motion, diastolic dysfunction  with elevated LV filling pressure, normal GLPSS at -21%, trivial  MR, moderate LAE, mild TR, top normal RVSP of 33 mmHG, normal  IVC, mobile IAS - cannot exclude small PFO.  Resolved Hospital Problem list     Assessment & Plan:  Raymond Stone is a 70 y.o. gentleman who was found in his home after well check - no one had heard from him in over 5 days.  Acute metabolic encephalopathy Suspect uremic encephalopathy  Acute Oliguric Kidney Injury Minimal uop despite resuscitation. Discussed with nephrology Will place HD line and start CRRT for clearance. He is still very dry. Would run even and give IVF Unclear etiology, sepsis still in the differential. Continue IVF with Vanc and zosyn previously started  Circulatory shock Started on low dose pressors due to hypotension when starting CRRT. Cannot rule out sepsis. Suspect still hypovolemic  as well.   Rhabdomyolysis Continue IVF - changed from NS to LR Prn fentanyl for pain  Hypernatremia, Hyperkalemia, Hyperchloremia - improving with fluids,   Anion Gap Metabolic Acidosis - secondary to uremia  HTN hold home meds.   Severe protein calorie malnutrition - albumin <2 - will need to consider tube feeds if not waking up enough to eat soon  Eczema Barrier cream applied Continue moisturizer May need topical steroids  Best practice (right click and "Reselect all SmartList  Selections" daily)  Diet:  NPO Pain/Anxiety/Delirium protocol (if indicated): fentanyl for pain VAP protocol (if indicated): Not indicated DVT prophylaxis: Subcutaneous Heparin GI prophylaxis: PPI Glucose control:  SSI No Central venous access:  N/A Arterial line:  N/A Foley:  Yes for uop Mobility:  bed rest  PT consulted: N/A Last date of multidisciplinary goals of care discussion []  Code Status:  full code Disposition: ICU   Labs   CBC: Recent Labs  Lab 10/12/20 1740 10/12/20 2136 10/13/20 0120 10/13/20 0256 10/14/20 0210  WBC 18.4*  --  18.7* 19.5* 13.5*  NEUTROABS 14.8*  --   --  15.7*  --   HGB 15.0 11.2* 11.1* 12.1* 10.6*  HCT 49.9 33.0* 37.6* 39.0 34.3*  MCV 111.6*  --  115.3* 109.2* 109.9*  PLT 523*  --  346 320 568    Basic Metabolic Panel: Recent Labs  Lab 10/12/20 1854 10/12/20 2130 10/12/20 2136 10/13/20 0256 10/13/20 0535 10/13/20 0719 10/13/20 1559 10/14/20 0210  NA 172* 171* 173*  --   --  170* 165* 156*  K 7.3* 5.1 6.0*  --  5.9* 6.2* 5.7* 4.9  CL >130* >130*  --   --   --  >130* >130* 123*  CO2 15* 19*  --   --   --  16* 19* 21*  GLUCOSE 156* 170*  --   --   --  154* 105* 111*  BUN 211* 198*  --   --   --  195* 170* 129*  CREATININE 11.25* 10.19*  --   --   --  9.76* 8.09* 5.96*  CALCIUM 10.9* 9.7  --   --   --  8.7* 8.3* 8.3*  MG  --   --   --  3.9*  --   --   --  2.7*  PHOS  --   --   --   --   --   --  5.2* 4.1   GFR: Estimated Creatinine Clearance: 11.7 mL/min (A) (by C-G formula based on SCr of 5.96 mg/dL (H)). Recent Labs  Lab 10/12/20 1740 10/12/20 2004 10/13/20 0120 10/13/20 0256 10/14/20 0210  WBC 18.4*  --  18.7* 19.5* 13.5*  LATICACIDVEN 7.8* 5.3*  --  3.1*  --     Liver Function Tests: Recent Labs  Lab 10/12/20 1854 10/13/20 0719 10/13/20 1559 10/14/20 0210  AST 118* 101*  --   --   ALT 42 33  --   --   ALKPHOS 61 43  --   --   BILITOT 1.5* 1.1  --   --   PROT 9.1* 6.3*  --   --   ALBUMIN 2.2* 1.6* 1.4* 1.5*    No results for input(s): LIPASE, AMYLASE in the last 168 hours. Recent Labs  Lab 10/12/20 1826  AMMONIA 46*    ABG    Component Value Date/Time   PHART 7.370 10/12/2020 2136   PCO2ART 26.0 (L) 10/12/2020 2136   PO2ART 96 10/12/2020 2136  HCO3 15.0 (L) 10/12/2020 2136   TCO2 16 (L) 10/12/2020 2136   ACIDBASEDEF 9.0 (H) 10/12/2020 2136   O2SAT 97.0 10/12/2020 2136     Coagulation Profile: Recent Labs  Lab 10/12/20 1740  INR 1.6*    Cardiac Enzymes: Recent Labs  Lab 10/12/20 1826 10/13/20 0256  CKTOTAL 2,442* 2,415*    HbA1C: Hemoglobin A1C  Date/Time Value Ref Range Status  01/23/2020 09:16 AM 5.3 4.0 - 5.6 % Final  01/31/2014 11:43 AM 5.8  Final   Hgb A1c MFr Bld  Date/Time Value Ref Range Status  05/22/2017 02:02 PM 6.0 (H) 4.8 - 5.6 % Final    Comment:    (NOTE) Pre diabetes:          5.7%-6.4% Diabetes:              >6.4% Glycemic control for   <7.0% adults with diabetes     CBG: Recent Labs  Lab 10/13/20 2006 10/14/20 0008 10/14/20 0437 10/14/20 0439 10/14/20 0748  GLUCAP 82 80 267* 93 90    The patient is critically ill due to encephalopathy, renal failure, life threatening acidosis.  Critical care was necessary to treat or prevent imminent or life-threatening deterioration.  Critical care was time spent personally by me on the following activities: development of treatment plan with patient and/or surrogate as well as nursing, discussions with consultants, evaluation of patient's response to treatment, examination of patient, obtaining history from patient or surrogate, ordering and performing treatments and interventions, ordering and review of laboratory studies, ordering and review of radiographic studies, pulse oximetry, re-evaluation of patient's condition and participation in multidisciplinary rounds.   Critical Care Time devoted to patient care services described in this note is 10minutes. This time reflects time of care of this [place] . This critical care time does not reflect separately billable procedures or procedure time, teaching time or supervisory time of PA/NP/Med student/Med Resident etc but could involve care discussion time.       Spero Geralds [facility] 10/14/2020 9:29 AM  Pager: see AMION  If no response to pager , please call critical care on call (see AMION) until 7pm After 7:00 pm call Elink

## 2020-10-15 DIAGNOSIS — N179 Acute kidney failure, unspecified: Secondary | ICD-10-CM | POA: Diagnosis not present

## 2020-10-15 DIAGNOSIS — M6282 Rhabdomyolysis: Secondary | ICD-10-CM | POA: Diagnosis not present

## 2020-10-15 DIAGNOSIS — E870 Hyperosmolality and hypernatremia: Secondary | ICD-10-CM | POA: Diagnosis not present

## 2020-10-15 LAB — CBC
HCT: 29.9 % — ABNORMAL LOW (ref 39.0–52.0)
Hemoglobin: 9.3 g/dL — ABNORMAL LOW (ref 13.0–17.0)
MCH: 33.3 pg (ref 26.0–34.0)
MCHC: 31.1 g/dL (ref 30.0–36.0)
MCV: 107.2 fL — ABNORMAL HIGH (ref 80.0–100.0)
Platelets: 244 10*3/uL (ref 150–400)
RBC: 2.79 MIL/uL — ABNORMAL LOW (ref 4.22–5.81)
RDW: 14.7 % (ref 11.5–15.5)
WBC: 18.4 10*3/uL — ABNORMAL HIGH (ref 4.0–10.5)
nRBC: 0.1 % (ref 0.0–0.2)

## 2020-10-15 LAB — GLUCOSE, CAPILLARY
Glucose-Capillary: 122 mg/dL — ABNORMAL HIGH (ref 70–99)
Glucose-Capillary: 107 mg/dL — ABNORMAL HIGH (ref 70–99)
Glucose-Capillary: 153 mg/dL — ABNORMAL HIGH (ref 70–99)
Glucose-Capillary: 162 mg/dL — ABNORMAL HIGH (ref 70–99)
Glucose-Capillary: 151 mg/dL — ABNORMAL HIGH (ref 70–99)

## 2020-10-15 LAB — RENAL FUNCTION PANEL
Albumin: 1.4 g/dL — ABNORMAL LOW (ref 3.5–5.0)
Albumin: 1.5 g/dL — ABNORMAL LOW (ref 3.5–5.0)
Anion gap: 8 (ref 5–15)
Anion gap: 8 (ref 5–15)
BUN: 95 mg/dL — ABNORMAL HIGH (ref 8–23)
BUN: 84 mg/dL — ABNORMAL HIGH (ref 8–23)
CO2: 23 mmol/L (ref 22–32)
CO2: 23 mmol/L (ref 22–32)
Calcium: 8.5 mg/dL — ABNORMAL LOW (ref 8.9–10.3)
Calcium: 8.9 mg/dL (ref 8.9–10.3)
Chloride: 124 mmol/L — ABNORMAL HIGH (ref 98–111)
Chloride: 123 mmol/L — ABNORMAL HIGH (ref 98–111)
Creatinine, Ser: 4.6 mg/dL — ABNORMAL HIGH (ref 0.61–1.24)
Creatinine, Ser: 3.88 mg/dL — ABNORMAL HIGH (ref 0.61–1.24)
GFR, Estimated: 13 mL/min — ABNORMAL LOW (ref >60)
GFR, Estimated: 16 mL/min — ABNORMAL LOW (ref >60)
Glucose, Bld: 116 mg/dL — ABNORMAL HIGH (ref 70–99)
Glucose, Bld: 199 mg/dL — ABNORMAL HIGH (ref 70–99)
Phosphorus: 3.8 mg/dL (ref 2.5–4.6)
Phosphorus: 3.8 mg/dL (ref 2.5–4.6)
Potassium: 4.2 mmol/L (ref 3.5–5.1)
Potassium: 4 mmol/L (ref 3.5–5.1)
Sodium: 155 mmol/L — ABNORMAL HIGH (ref 135–145)
Sodium: 154 mmol/L — ABNORMAL HIGH (ref 135–145)

## 2020-10-15 LAB — LEGIONELLA PNEUMOPHILA SEROGP 1 UR AG: L. pneumophila Serogp 1 Ur Ag: NEGATIVE

## 2020-10-15 LAB — PHOSPHORUS: Phosphorus: 3.8 mg/dL (ref 2.5–4.6)

## 2020-10-15 LAB — CK: Total CK: 1430 U/L — ABNORMAL HIGH (ref 49–397)

## 2020-10-15 LAB — MAGNESIUM: Magnesium: 2.2 mg/dL (ref 1.7–2.4)

## 2020-10-15 MED ORDER — FREE WATER
200.0000 mL | Status: DC
  Administered 2020-10-15: 200 mL via ORAL
  Administered 2020-10-15: 150 mL via ORAL
  Administered 2020-10-15 – 2020-10-16 (×4): 200 mL via ORAL

## 2020-10-15 MED ORDER — LACTATED RINGERS IV SOLN: INTRAVENOUS | Status: DC

## 2020-10-15 MED ORDER — DEXTROSE 5 % IV SOLN: INTRAVENOUS | Status: DC

## 2020-10-15 NOTE — Progress Notes (Signed)
[facility] Progress Note   Subjective:   Labs improving off CRRT.  Na 155 this AM.  Had a fever of up to 103 degrees F last night.     Objective Vitals:   10/15/20 0630 10/15/20 0700 10/15/20 0742 10/15/20 0800  BP: 123/75 129/65    Pulse:      Resp: (!) 28 (!) 35    Temp:   98.5 F (36.9 C)   TempSrc:   Oral   SpO2:    99%  Weight:      Height:       Physical Exam General: better mental status today ENT: MM dry Heart: RRR Lungs: clear  Abdomen:soft Extremities: 1+ UE edema Dialysis Access: R IJ temp cath GU: + yellow urine SKIN: + tenting, looks like some L sided separation of the epidermis from the [facility]   Additional Objective Labs: Basic Metabolic Panel: Recent Labs  Lab 10/14/20 0904 10/14/20 1435 10/15/20 0322  NA 154* 153* 155*  K 4.8 4.7 4.2  CL 120* 120* 124*  CO2 23 23 23   GLUCOSE 103* 112* 116*  BUN 106* 102* 95*  CREATININE 4.79* 4.78* 4.60*  CALCIUM 8.3* 8.4* 8.5*  PHOS 4.3 3.7 3.8  3.8   Liver Function Tests: Recent Labs  Lab 10/12/20 1854 10/13/20 0719 10/13/20 1559 10/14/20 0904 10/14/20 1435 10/15/20 0322  AST 118* 101*  --   --   --   --   ALT 42 33  --   --   --   --   ALKPHOS 61 43  --   --   --   --   BILITOT 1.5* 1.1  --   --   --   --   PROT 9.1* 6.3*  --   --   --   --   ALBUMIN 2.2* 1.6*   < > 1.5* 1.4* 1.4*   < > = values in this interval not displayed.   No results for input(s): LIPASE, AMYLASE in the last 168 hours. CBC: Recent Labs  Lab 10/12/20 1740 10/12/20 2136 10/13/20 0120 10/13/20 0256 10/14/20 0210 10/15/20 0322  WBC 18.4*  --  18.7* 19.5* 13.5* 18.4*  NEUTROABS 14.8*  --   --  15.7*  --   --   HGB 15.0   < > 11.1* 12.1* 10.6* 9.3*  HCT 49.9   < > 37.6* 39.0 34.3* 29.9*  MCV 111.6*  --  115.3* 109.2* 109.9* 107.2*  PLT 523*  --  346 320 289 244   < > = values in this interval not displayed.   Blood Culture    Component Value Date/Time   SDES BLOOD SITE NOT SPECIFIED 10/12/2020 1818    SPECREQUEST  10/12/2020 1818    BOTTLES DRAWN AEROBIC AND ANAEROBIC Blood Culture adequate volume   CULT  10/12/2020 1818    NO GROWTH 3 DAYS Performed at [facility], [place] [address] [place], [place] [address]    REPTSTATUS PENDING 10/12/2020 1818    Cardiac Enzymes: Recent Labs  Lab 10/12/20 1826 10/13/20 0256 10/15/20 0322  CKTOTAL 2,442* 2,415* 1,430*   CBG: Recent Labs  Lab 10/14/20 1616 10/14/20 1927 10/14/20 2332 10/15/20 0326 10/15/20 0743  GLUCAP 118* 136* 131* 122* 107*   Iron Studies: No results for input(s): IRON, TIBC, TRANSFERRIN, FERRITIN in the last 72 hours. @lablastinr3 @ Studies/Results: DG CHEST PORT 1 VIEW  Result Date: 10/13/2020 CLINICAL DATA:  Central line placement. EXAM: PORTABLE CHEST 1 VIEW COMPARISON:  Radiographs 10/12/2020 and 05/25/2017. FINDINGS: 1207 hours. Right IJ central venous catheter projects to the level of the upper SVC. The heart size and mediastinal contours are stable. Probable mild atelectasis at the right lung base, similar to prior studies. The lungs are otherwise clear. There is no pleural effusion or pneumothorax. Stable chronic metallic foreign body in the right lateral chest wall. No acute osseous findings. IMPRESSION: Central venous catheter placement as described. No evidence of pneumothorax. Electronically Signed   By: Richardean Sale M.D.   On: 10/13/2020 12:38   Medications: . sodium chloride Stopped (10/14/20 0859)  . dexmedetomidine (PRECEDEX) IV infusion Stopped (10/14/20 0911)  . dextrose    . lactated ringers    . norepinephrine (LEVOPHED) Adult infusion Stopped (10/14/20 2308)  . piperacillin-tazobactam (ZOSYN)  IV 3.375 g (10/15/20 1019)   . chlorhexidine  15 mL Mouth Rinse BID  . Chlorhexidine Gluconate Cloth  6 each Topical Q0600  . collagenase   Topical Daily  . feeding supplement  237 mL Oral BID BM  . free water  200 mL Oral Q4H  . heparin  5,000 Units Subcutaneous Q8H  . mouth rinse  15 mL  Mouth Rinse q12n4p  . multivitamin with minerals  1 tablet Oral Daily  . sodium chloride flush  10-40 mL Intracatheter Q12H   Assessment/Plan **Shock:  Hypovolemic, suspect septic as well.  Volume resuscitation underway and broad spectrum antibiotics (vanc/ zosyn)   Cultures are pending.  Had a fever to 103 last night   **AKI, severe: Cr 1.14 in 01/2020; on presentation 11.  Presumably hypovolemic that's progressed to ATN; CK modestly elevated which isn't helping. Renal US without acute issue.  Has improved markedly with volume resuscitation and < 24 hrs CRRT.  Making urine.  On maintenance IVFs, will continue with LR and add D5 to it too- I have changed the rate of IVFs so he's getting a total of 125/ hr.  PM RFP to ensure correction of Na  **Hyperkalemia: severe,  Has been managed medically with insulin/dextrose/bicarb/ca and volume expansion.  now normokalemic  **Hypernatremia,severe: no po intake x days.  on LR volume expansion and D5W.  Trend.  Generous correction over first 24 hrs---> appears that has stabilized at 155, will add the D5 as above  **AGMA: AKI and lactate.  Care per above. PH on ABG ok.   ** Extensive DTI-- looks like skin is going to slough off soon, hydrotherapy planned.  ? If he will end up having burn injury physiology if there is extensive sloughing  Will follow, contact with concerns.   Madelon Lips MD 10/15/2020, 11:10 AM  [facility] Pager: [phone]

## 2020-10-15 NOTE — Progress Notes (Signed)
NAME:  Raymond Stone, MRN:  [id number], DOB:  04-20-1951, LOS: 3 ADMISSION DATE:  10/12/2020, CONSULTATION DATE:  10/12/20 REFERRING MD: Dr. Joya Gaskins ED, CHIEF COMPLAINT:  Altered, found down  History of Present Illness:  Raymond Stone in a 70 yo man with hx of HTN, lives alone, found down after not being seen since Monday (5 days).  EMS was not able to initially get a bp.   Fever - temp 100.  On NRB initially, on room air at my exam Has received 2.7 L NS.  Now on 250cc/hr ns.    In Ed: got 1 g ca gluc, bicarb 1 amp, insulin 10u, D50  830pm Vanc and zosyn ordered.   Pertinent  Medical History  HTN Peripheral edema Hx of Angioedema Hx of AKI Hx of anemia Eczema CHF (grade 1, LVH by 2017 echo) Etoh 3-4 per week  Meds: norvasc, desowen, colace, metoprolol 25 tid,  Allergies lisinopril    Significant Hospital Events: Including procedures, antibiotic start and stop dates in addition to other pertinent events   . 4/2 Admitted overnight . 4/3 started CRRT . 4/4 CRRT discontinued this am due to pressure alarming . 4/5 Weaned off pressors yesterday. Mental status slowly improving  Interim History / Subjective:  Febrile 101.6 last night. Currently afebrile. Good UOP. Off pressors  Objective   Blood pressure 129/65, pulse (!) 111, temperature 98.7 F (37.1 C), temperature source Oral, resp. rate (!) 35, height 5\' 9"  (1.753 m), weight 78 kg, SpO2 99 %.        Intake/Output Summary (Last 24 hours) at 10/15/2020 1340 Last data filed at 10/15/2020 1000 Gross per 24 hour  Intake 2228.51 ml  Output 1620 ml  Net 608.51 ml   Filed Weights   10/13/20 1315 10/14/20 0500 10/15/20 0500  Weight: (S) 76.2 kg 76.7 kg 78 kg   Physical Exam: General: Chronically ill-appearing, no acute distress, drowsy but more alert compared to yesterdau HENT: [place], AT, OP clear, MMM Eyes: EOMI, no scleral icterus Respiratory: Diminished breath sounds bilaterally.  No crackles, wheezing or rales Cardiovascular:  RRR, -M/R/G, no JVD GI: BS+, soft, nontender Extremities:-Edema,-tenderness Neuro: Drowsy, answers questions with orientation to self, follows commands, weakly follows commands bilaterally GU: Foley in place  Labs/imaging that I havepersonally reviewed  (right click and "Reselect all SmartList Selections" daily)   Na 165>156>155 Cl >130>123>124 CO2 21 BUN/Cr 129/5.96> 116/4.6  WBC 13.5>18.4  CK 2415>1430 LA 3.1  CT head: IMPRESSION: 1. No acute intracranial findings. 2. Progressive atrophy and white matter microvascular disease compared to CT exam 2014.  EKG reviewed  2017 echo  Impressions:   - LVEF 60-65%, mild LVH, normal wall motion, diastolic dysfunction  with elevated LV filling pressure, normal GLPSS at -21%, trivial  MR, moderate LAE, mild TR, top normal RVSP of 33 mmHG, normal  IVC, mobile IAS - cannot exclude small PFO.   Imaging, labs and test noted above and in last 24 hours have been reviewed independently by me.  Resolved Hospital Problem list   Anion gap acidosis secondary to uremia  Assessment & Plan:  Raymond Stone is a 70 y.o. gentleman who was found in his home after well check - no one had heard from him in over 5 days.  Acute metabolic encephalopathy - improving Suspect uremic encephalopathy, hypernatremia - Continue dialysis as able - Trend BMET - If unable to tolerate PO due to mental status, will need enteral access for FWF  Acute Oliguric Kidney Injury  Improving UOP  and Cr - Nephrology consulted.  - Maintenance IVF for volume depletion - Monitor UOP/BMET  Sepsis secondary to aspiration pneumonitis/pneumonia - fever  Hypovolemic shock - resolved - Off pressors - Wean O2 for SpO2 >90% - Continue Zosyn - Follow-up final culture data  - Goal MAP >65   Rhabdomyolysis - improving - Maintenance IVF for volume depletion -Trend CK - Prn fentanyl for pain  Hypernatremia - plateau at 150s Hyperkalemia - resolved - improving  with fluids and CRRT - continue maintenance fluids with LR and D5 - Follow-up PM BMET  HTN - hold home meds.   Severe protein calorie malnutrition - albumin <2 - will need to consider tube feeds if not waking up enough to eat soon  Eczema Barrier cream applied Continue moisturizer May need topical steroids  Best practice (right click and "Reselect all SmartList Selections" daily)  Diet:  NPO Pain/Anxiety/Delirium protocol (if indicated): Wean Precedex. fentanyl for pain VAP protocol (if indicated): Not indicated DVT prophylaxis: Subcutaneous Heparin GI prophylaxis: Not indicated Glucose control:  SSI No Central venous access:  N/A Arterial line:  N/A Foley:  Yes for uop Mobility:  bed rest  PT consulted: N/A Last date of multidisciplinary goals of care discussion []  Code Status:  full code Disposition: ICU   Labs   CBC: Recent Labs  Lab 10/12/20 1740 10/12/20 2136 10/13/20 0120 10/13/20 0256 10/14/20 0210 10/15/20 0322  WBC 18.4*  --  18.7* 19.5* 13.5* 18.4*  NEUTROABS 14.8*  --   --  15.7*  --   --   HGB 15.0 11.2* 11.1* 12.1* 10.6* 9.3*  HCT 49.9 33.0* 37.6* 39.0 34.3* 29.9*  MCV 111.6*  --  115.3* 109.2* 109.9* 107.2*  PLT 523*  --  346 320 289 409    Basic Metabolic Panel: Recent Labs  Lab 10/13/20 0256 10/13/20 0535 10/13/20 1559 10/14/20 0210 10/14/20 0904 10/14/20 1435 10/15/20 0322  NA  --    < > 165* 156* 154* 153* 155*  K  --    < > 5.7* 4.9 4.8 4.7 4.2  CL  --    < > >130* 123* 120* 120* 124*  CO2  --    < > 19* 21* 23 23 23   GLUCOSE  --    < > 105* 111* 103* 112* 116*  BUN  --    < > 170* 129* 106* 102* 95*  CREATININE  --    < > 8.09* 5.96* 4.79* 4.78* 4.60*  CALCIUM  --    < > 8.3* 8.3* 8.3* 8.4* 8.5*  MG 3.9*  --   --  2.7*  --   --  2.2  PHOS  --   --  5.2* 4.1 4.3 3.7 3.8  3.8   < > = values in this interval not displayed.   GFR: Estimated Creatinine Clearance: 15.2 mL/min (A) (by C-G formula based on SCr of 4.6 mg/dL  (H)). Recent Labs  Lab 10/12/20 1740 10/12/20 2004 10/13/20 0120 10/13/20 0256 10/14/20 0210 10/15/20 0322  WBC 18.4*  --  18.7* 19.5* 13.5* 18.4*  LATICACIDVEN 7.8* 5.3*  --  3.1*  --   --     Liver Function Tests: Recent Labs  Lab 10/12/20 1854 10/13/20 0719 10/13/20 1559 10/14/20 0210 10/14/20 0904 10/14/20 1435 10/15/20 0322  AST 118* 101*  --   --   --   --   --   ALT 42 33  --   --   --   --   --  ALKPHOS 61 43  --   --   --   --   --   BILITOT 1.5* 1.1  --   --   --   --   --   PROT 9.1* 6.3*  --   --   --   --   --   ALBUMIN 2.2* 1.6* 1.4* 1.5* 1.5* 1.4* 1.4*   No results for input(s): LIPASE, AMYLASE in the last 168 hours. Recent Labs  Lab 10/12/20 1826  AMMONIA 46*    ABG    Component Value Date/Time   PHART 7.370 10/12/2020 2136   PCO2ART 26.0 (L) 10/12/2020 2136   PO2ART 96 10/12/2020 2136   HCO3 15.0 (L) 10/12/2020 2136   TCO2 16 (L) 10/12/2020 2136   ACIDBASEDEF 9.0 (H) 10/12/2020 2136   O2SAT 97.0 10/12/2020 2136     Coagulation Profile: Recent Labs  Lab 10/12/20 1740  INR 1.6*    Cardiac Enzymes: Recent Labs  Lab 10/12/20 1826 10/13/20 0256 10/15/20 0322  CKTOTAL 2,442* 2,415* 1,430*    HbA1C: Hemoglobin A1C  Date/Time Value Ref Range Status  01/23/2020 09:16 AM 5.3 4.0 - 5.6 % Final  01/31/2014 11:43 AM 5.8  Final   Hgb A1c MFr Bld  Date/Time Value Ref Range Status  05/22/2017 02:02 PM 6.0 (H) 4.8 - 5.6 % Final    Comment:    (NOTE) Pre diabetes:          5.7%-6.4% Diabetes:              >6.4% Glycemic control for   <7.0% adults with diabetes     CBG: Recent Labs  Lab 10/14/20 1927 10/14/20 2332 10/15/20 0326 10/15/20 0743 10/15/20 1131  GLUCAP 136* 131* 122* 107* 153*   Care Time: 35 minutes  Rodman Pickle, M.D. [facility] Pulmonary/Critical Care Medicine 10/15/2020 1:41 PM

## 2020-10-15 NOTE — Progress Notes (Signed)
Physical Therapy Wound Evaluation and Treatment Patient Details  Name: Raymond Stone MRN: [id number] Date of Birth: [birth year]  Today's Date: 10/15/2020 Time: 6962-9528 Time Calculation (min): 56 min  Subjective  Subjective Assessment Subjective: Pt pleasant and agreeable to hydrotherapy. Difficult to understand at times as voice is soft and hoarse sounding. Patient and Family Stated Goals: None stated Date of Onset:  (Unsure) Prior Treatments: Dressing changes prior to hydro being consulted.  Pain Score:  Pt premedicated with IV pain meds and tolerated treatment well with minimal complaints of pain.  Wound Assessment  Pressure Injury 10/13/20 Thigh Anterior;Left;Proximal Stage 2 -  Partial thickness loss of dermis presenting as a shallow open injury with a red, pink wound bed without slough. (Active)  Wound Image   10/15/20 1349  Dressing Type Barrier Film (skin prep);Foam - Lift dressing to assess site every shift;Gauze (Comment);Moist to moist;Santyl 10/15/20 1349  Dressing Changed;Clean;Dry;Intact 10/15/20 1349  Dressing Change Frequency Daily 10/15/20 1349  State of Healing Other (Comment) 10/15/20 1349  Site / Wound Assessment Purple;Pink;Yellow 10/15/20 1349  % Wound base Red or Granulating 10% 10/15/20 1349  % Wound base Yellow/Fibrinous Exudate 10% 10/15/20 1349  % Wound base Black/Eschar 0% 10/15/20 1349  % Wound base Other/Granulation Tissue (Comment) 80% Deep Tissue Injury/Purple 10/15/20 1349  Peri-wound Assessment Intact 10/15/20 1349  Wound Length (cm) 17 cm 10/15/20 1200  Wound Width (cm) 10 cm 10/15/20 1200  Wound Depth (cm) 0.1 cm 10/15/20 1200  Wound Surface Area (cm^2) 170 cm^2 10/15/20 1200  Wound Volume (cm^3) 17 cm^3 10/15/20 1200  Tunneling (cm) 0 10/15/20 1349  Undermining (cm) 0 10/15/20 1349  Margins Unattached edges (unapproximated) 10/15/20 1349  Drainage Amount Minimal 10/15/20 1349  Drainage Description Serosanguineous 10/15/20 1349  Treatment  Hydrotherapy (Pulse lavage);Packing (Saline gauze) 10/15/20 1349     Pressure Injury 10/13/20 Flank Left;Mid Stage 2 -  Partial thickness loss of dermis presenting as a shallow open injury with a red, pink wound bed without slough. (Active)  Wound Image   10/15/20 1349  Dressing Type Barrier Film (skin prep);Gauze (Comment);Moist to moist;Santyl;Foam - Lift dressing to assess site every shift 10/15/20 1349  Dressing Changed;Clean;Dry;Intact 10/15/20 1349  Dressing Change Frequency Daily 10/15/20 1349  State of Healing Eschar 10/15/20 1349  Site / Wound Assessment Black;Yellow 10/15/20 1349  % Wound base Red or Granulating 0% 10/15/20 1349  % Wound base Yellow/Fibrinous Exudate 25% 10/15/20 1349  % Wound base Black/Eschar 75% 10/15/20 1349  Peri-wound Assessment Intact 10/15/20 1349  Wound Length (cm) 5.5 cm 10/15/20 1200  Wound Width (cm) 2 cm 10/15/20 1200  Wound Depth (cm) 0.1 cm 10/15/20 1200  Wound Surface Area (cm^2) 11 cm^2 10/15/20 1200  Wound Volume (cm^3) 1.1 cm^3 10/15/20 1200  Tunneling (cm) 0 10/15/20 1349  Undermining (cm) 0 10/15/20 1349  Margins Unattached edges (unapproximated) 10/15/20 1349  Drainage Amount Scant 10/15/20 1349  Drainage Description Serosanguineous 10/15/20 1349  Treatment Debridement (Selective);Hydrotherapy (Pulse lavage);Packing (Saline gauze) 10/15/20 1349      Hydrotherapy Pulsed lavage therapy - wound location: L hip and L flank Pulsed Lavage with Suction (psi): 8 psi Pulsed Lavage with Suction - Normal Saline Used: 1000 mL Pulsed Lavage Tip: Tip with splash shield Selective Debridement Selective Debridement - Location: L flank Selective Debridement - Tools Used: Scalpel Selective Debridement - Tissue Removed: Scored eschar    Wound Assessment and Plan  Wound Therapy - Assess/Plan/Recommendations Wound Therapy - Clinical Statement: Pt presents to hydrotherapy with several wounds after being found  down for an undetermined length of time at  home. Hydrotherapy consulted for areas of eschar at the L inferior ribcage and on the L lateral thigh. Thigh wound still considered a deep tissue injury. At this time we will only be following the L flank wound until L thigh wound evolves and becomes appropriate for hydrotherapy intervention. Myrla Halsted to update our orders tomorrow morning, and hydrotherapy will continue to monitor for appropriateness to resume treatment for the thigh wound. Wound Therapy - Functional Problem List: Global weakness in the setting of undetermined time down and critical illness. Factors Delaying/Impairing Wound Healing: Infection - systemic/local,Immobility Hydrotherapy Plan: Debridement,Dressing change,Patient/family education,Pulsatile lavage with suction Wound Therapy - Frequency: 6X / week Wound Therapy - Current Recommendations: PT Wound Therapy - Follow Up Recommendations: dressing changes by RN  Wound Therapy Goals- Improve the function of patient's integumentary system by progressing the wound(s) through the phases of wound healing (inflammation - proliferation - remodeling) by: Wound Therapy Goals - Improve the function of patient's integumentary system by progressing the wound(s) through the phases of wound healing by: Decrease Necrotic Tissue to: 20% Decrease Necrotic Tissue - Progress: Goal set today Increase Granulation Tissue to: 80% Increase Granulation Tissue - Progress: Goal set today Goals/treatment plan/discharge plan were made with and agreed upon by patient/family: No, Patient unable to participate in goals/treatment/discharge plan and family unavailable Time For Goal Achievement: 7 days Wound Therapy - Potential for Goals: Good  Goals will be updated until maximal potential achieved or discharge criteria met.  Discharge criteria: when goals achieved, discharge from hospital, MD decision/surgical intervention, no progress towards goals, refusal/missing three consecutive treatments without  notification or medical reason.  GP     Charges PT Wound Care Charges $Wound Debridement up to 20 cm: < or equal to 20 cm $PT Hydrotherapy Dressing: 2 dressings $PT PLS Gun and Tip: 1 Supply $PT Hydrotherapy Visit: 2 Visits       Thelma Comp 10/15/2020, 2:35 PM   Rolinda Roan, PT, DPT Acute Rehabilitation Services Pager: [phone] Office: [phone]

## 2020-10-16 DIAGNOSIS — N179 Acute kidney failure, unspecified: Secondary | ICD-10-CM | POA: Diagnosis not present

## 2020-10-16 DIAGNOSIS — M6282 Rhabdomyolysis: Secondary | ICD-10-CM | POA: Diagnosis not present

## 2020-10-16 DIAGNOSIS — E870 Hyperosmolality and hypernatremia: Secondary | ICD-10-CM | POA: Diagnosis not present

## 2020-10-16 LAB — CBC
HCT: 29.8 % — ABNORMAL LOW (ref 39.0–52.0)
Hemoglobin: 9.3 g/dL — ABNORMAL LOW (ref 13.0–17.0)
MCH: 33.9 pg (ref 26.0–34.0)
MCHC: 31.2 g/dL (ref 30.0–36.0)
MCV: 108.8 fL — ABNORMAL HIGH (ref 80.0–100.0)
Platelets: 250 10*3/uL (ref 150–400)
RBC: 2.74 MIL/uL — ABNORMAL LOW (ref 4.22–5.81)
RDW: 14.6 % (ref 11.5–15.5)
WBC: 21.3 10*3/uL — ABNORMAL HIGH (ref 4.0–10.5)
nRBC: 0 % (ref 0.0–0.2)

## 2020-10-16 LAB — RENAL FUNCTION PANEL
Albumin: 1.4 g/dL — ABNORMAL LOW (ref 3.5–5.0)
Albumin: 1.4 g/dL — ABNORMAL LOW (ref 3.5–5.0)
Anion gap: 7 (ref 5–15)
Anion gap: 8 (ref 5–15)
BUN: 67 mg/dL — ABNORMAL HIGH (ref 8–23)
BUN: 54 mg/dL — ABNORMAL HIGH (ref 8–23)
CO2: 25 mmol/L (ref 22–32)
CO2: 22 mmol/L (ref 22–32)
Calcium: 8.9 mg/dL (ref 8.9–10.3)
Calcium: 8.6 mg/dL — ABNORMAL LOW (ref 8.9–10.3)
Chloride: 121 mmol/L — ABNORMAL HIGH (ref 98–111)
Chloride: 114 mmol/L — ABNORMAL HIGH (ref 98–111)
Creatinine, Ser: 3.04 mg/dL — ABNORMAL HIGH (ref 0.61–1.24)
Creatinine, Ser: 2.4 mg/dL — ABNORMAL HIGH (ref 0.61–1.24)
GFR, Estimated: 21 mL/min — ABNORMAL LOW (ref >60)
GFR, Estimated: 28 mL/min — ABNORMAL LOW (ref >60)
Glucose, Bld: 140 mg/dL — ABNORMAL HIGH (ref 70–99)
Glucose, Bld: 135 mg/dL — ABNORMAL HIGH (ref 70–99)
Phosphorus: 3.5 mg/dL (ref 2.5–4.6)
Phosphorus: 2.9 mg/dL (ref 2.5–4.6)
Potassium: 3.8 mmol/L (ref 3.5–5.1)
Potassium: 3.8 mmol/L (ref 3.5–5.1)
Sodium: 153 mmol/L — ABNORMAL HIGH (ref 135–145)
Sodium: 144 mmol/L (ref 135–145)

## 2020-10-16 LAB — GLUCOSE, CAPILLARY
Glucose-Capillary: 116 mg/dL — ABNORMAL HIGH (ref 70–99)
Glucose-Capillary: 126 mg/dL — ABNORMAL HIGH (ref 70–99)
Glucose-Capillary: 132 mg/dL — ABNORMAL HIGH (ref 70–99)
Glucose-Capillary: 135 mg/dL — ABNORMAL HIGH (ref 70–99)

## 2020-10-16 LAB — MAGNESIUM: Magnesium: 1.8 mg/dL (ref 1.7–2.4)

## 2020-10-16 MED ORDER — ENSURE ENLIVE PO LIQD
237.0000 mL | Freq: Three times a day (TID) | ORAL | Status: DC
  Administered 2020-10-16 – 2020-10-27 (×11): 237 mL via ORAL
  Administered 2020-11-02 (×2): 50 mL via ORAL
  Administered 2020-11-02 – 2020-11-05 (×3): 237 mL via ORAL

## 2020-10-16 MED ORDER — PIPERACILLIN-TAZOBACTAM 3.375 G IVPB: 3.3750 g | Freq: Three times a day (TID) | INTRAVENOUS | Status: DC

## 2020-10-16 MED ORDER — ACETAMINOPHEN 325 MG PO TABS
650.0000 mg | ORAL_TABLET | Freq: Four times a day (QID) | ORAL | Status: DC | PRN
  Administered 2020-10-18 – 2020-10-22 (×5): 650 mg via ORAL
  Filled 2020-10-16 (×6): qty 2

## 2020-10-16 MED ORDER — ACETAMINOPHEN 160 MG/5ML PO SOLN: 650.0000 mg | Freq: Four times a day (QID) | ORAL | Status: DC | PRN

## 2020-10-16 MED ORDER — FREE WATER: 400.0000 mL | Status: DC

## 2020-10-16 NOTE — Progress Notes (Signed)
Patient expressed concern over ability to pay bills and rent whilst in the hospital. Will relay to dayshift need for case management/social worker consult.

## 2020-10-16 NOTE — Progress Notes (Signed)
[facility] Progress Note   Subjective:  Cr continues to improve along with electrolytes, Na slightly improved.  Upset because he thinks he hasn't seen several properties that he owns.  Objective Vitals:   10/16/20 0800 10/16/20 0809 10/16/20 0900 10/16/20 1000  BP: 136/81  124/79 134/85  Pulse: 95  95 96  Resp: 16  (!) 22 17  Temp: 98.1 F (36.7 C) 98.2 F (36.8 C)    TempSrc: Oral Oral    SpO2: 97%  98% 97%  Weight:      Height:       Physical Exam General: better mental status today ENT: MM better Heart: RRR Lungs: clear  Abdomen:soft Extremities: 1+ UE edema Dialysis Access: R IJ temp cath GU: + yellow urine SKIN: + tenting, looks like some L sided separation of the epidermis from the [facility]   Additional Objective Labs: Basic Metabolic Panel: Recent Labs  Lab 10/15/20 0322 10/15/20 1452 10/16/20 0334  NA 155* 154* 153*  K 4.2 4.0 3.8  CL 124* 123* 121*  CO2 23 23 25   GLUCOSE 116* 199* 140*  BUN 95* 84* 67*  CREATININE 4.60* 3.88* 3.04*  CALCIUM 8.5* 8.9 8.9  PHOS 3.8  3.8 3.8 3.5   Liver Function Tests: Recent Labs  Lab 10/12/20 1854 10/13/20 0719 10/13/20 1559 10/15/20 0322 10/15/20 1452 10/16/20 0334  AST 118* 101*  --   --   --   --   ALT 42 33  --   --   --   --   ALKPHOS 61 43  --   --   --   --   BILITOT 1.5* 1.1  --   --   --   --   PROT 9.1* 6.3*  --   --   --   --   ALBUMIN 2.2* 1.6*   < > 1.4* 1.5* 1.4*   < > = values in this interval not displayed.   No results for input(s): LIPASE, AMYLASE in the last 168 hours. CBC: Recent Labs  Lab 10/12/20 1740 10/12/20 2136 10/13/20 0120 10/13/20 0256 10/14/20 0210 10/15/20 0322 10/16/20 0334  WBC 18.4*  --  18.7* 19.5* 13.5* 18.4* 21.3*  NEUTROABS 14.8*  --   --  15.7*  --   --   --   HGB 15.0   < > 11.1* 12.1* 10.6* 9.3* 9.3*  HCT 49.9   < > 37.6* 39.0 34.3* 29.9* 29.8*  MCV 111.6*  --  115.3* 109.2* 109.9* 107.2* 108.8*  PLT 523*  --  346 320 289 244 250   < > =  values in this interval not displayed.   Blood Culture    Component Value Date/Time   SDES BLOOD SITE NOT SPECIFIED 10/12/2020 1818   SPECREQUEST  10/12/2020 1818    BOTTLES DRAWN AEROBIC AND ANAEROBIC Blood Culture adequate volume   CULT  10/12/2020 1818    NO GROWTH 3 DAYS Performed at [facility], [place] [address] [place],  [address]    REPTSTATUS PENDING 10/12/2020 1818    Cardiac Enzymes: Recent Labs  Lab 10/12/20 1826 10/13/20 0256 10/15/20 0322  CKTOTAL 2,442* 2,415* 1,430*   CBG: Recent Labs  Lab 10/15/20 1131 10/15/20 1530 10/15/20 2030 10/16/20 0011 10/16/20 0756  GLUCAP 153* 162* 151* 132* 116*   Iron Studies: No results for input(s): IRON, TIBC, TRANSFERRIN, FERRITIN in the last 72 hours. @lablastinr3 @ Studies/Results: No results found. Medications: . sodium chloride Stopped (10/14/20 0859)  .  dextrose 100 mL/hr at 10/16/20 1028  . piperacillin-tazobactam (ZOSYN)  IV 12.5 mL/hr at 10/16/20 1026   . chlorhexidine  15 mL Mouth Rinse BID  . Chlorhexidine Gluconate Cloth  6 each Topical Q0600  . collagenase   Topical Daily  . feeding supplement  237 mL Oral BID BM  . heparin  5,000 Units Subcutaneous Q8H  . mouth rinse  15 mL Mouth Rinse q12n4p  . multivitamin with minerals  1 tablet Oral Daily  . sodium chloride flush  10-40 mL Intracatheter Q12H   Assessment/Plan **Shock:  Hypovolemic, suspect septic as well.  Volume resuscitation underway and broad spectrum antibiotics (vanc/ zosyn)   Cultures are negative so far.  Had a fever to 103 last night   **AKI, severe: Cr 1.14 in 01/2020; on presentation 11.  Presumably hypovolemic that's progressed to ATN; CK modestly elevated which isn't helping. Renal US without acute issue.  Has improved markedly with volume resuscitation and < 24 hrs CRRT.  Making urine.  On maintenance IVFs and FWF--> Increase FWF to 400 q 4 today, d/c LR and increase D5W to 100 mL/ hr.  PM labs.  Remove HD cath today.     **Hyperkalemia: severe,  Has been managed medically with insulin/dextrose/bicarb/ca and volume expansion.  now normokalemic  **Hypernatremia,severe: no po intake x days.  on LR volume expansion and D5W.  Trend.  Generous correction over first 24 hrs--->now stabilized and using mix of PO and IVFs to correct  **AGMA: AKI and lactate.  Care per above. PH on ABG ok.   ** Extensive DTI-- looks like skin is going to slough off soon, hydrotherapy planned.  ? If he will end up having burn injury physiology if there is extensive sloughing  Pt appears to be improving from a nephrology perspective- nothing else really to add.  Will sign off, call with questions. Madelon Lips MD 10/16/2020, 10:56 AM  [facility] Pager: [phone]

## 2020-10-16 NOTE — Progress Notes (Addendum)
NAME:  Raymond Stone, MRN:  [id number], DOB:  04-29-1951, LOS: 4 ADMISSION DATE:  10/12/2020, CONSULTATION DATE:  10/12/20 REFERRING MD: Dr. Joya Gaskins ED, CHIEF COMPLAINT:  Altered, found down  History of Present Illness:  Raymond Stone in a 70 yo man with hx of HTN, lives alone, found down after not being seen since Monday (5 days).  EMS was not able to initially get a bp.   Fever - temp 100.  On NRB initially, on room air at my exam Has received 2.7 L NS.  Now on 250cc/hr ns.    In Ed: got 1 g ca gluc, bicarb 1 amp, insulin 10u, D50  830pm Vanc and zosyn ordered.   Pertinent  Medical History  HTN Peripheral edema Hx of Angioedema Hx of AKI Hx of anemia Eczema CHF (grade 1, LVH by 2017 echo) Etoh 3-4 per week  Meds: norvasc, desowen, colace, metoprolol 25 tid,  Allergies lisinopril    Significant Hospital Events: Including procedures, antibiotic start and stop dates in addition to other pertinent events   . 4/2 Admitted overnight . 4/3 started CRRT . 4/4 CRRT discontinued this am due to pressure alarming . 4/5 Weaned off pressors yesterday. Mental status slowly improving . 4/6 Renal function improving. Continues on mIVF. Transfer to [facility] tomorrow as he likely needs placement for severe deconditioning  Interim History / Subjective:  Afebrile. Drowsy but more alert compared to yesterday. Denies pain. Wanting to eat and drink  Objective   Blood pressure 136/81, pulse 95, temperature 98.2 F (36.8 C), temperature source Oral, resp. rate 16, height 5\' 9"  (1.753 m), weight 78.1 kg, SpO2 97 %.        Intake/Output Summary (Last 24 hours) at 10/16/2020 0848 Last data filed at 10/16/2020 0809 Gross per 24 hour  Intake 4479.29 ml  Output 2475 ml  Net 2004.29 ml   Filed Weights   10/14/20 0500 10/15/20 0500 10/16/20 0500  Weight: 76.7 kg 78 kg 78.1 kg   Physical Exam: General: Chronically ill-appearing, no acute distress HENT: [place], AT, OP clear, MMM Eyes: EOMI, no scleral  icterus Respiratory: Diminished breath sounds bilaterally.  No crackles, wheezing or rales Cardiovascular: RRR, -M/R/G, no JVD GI: BS+, soft, nontender Extremities: Mild upper extremity edema, -lower extremity edema,-tenderness Neuro: Awake, oriented to self, follows commands, weakly moves upper and lower extremities bilaterally GU: Foley in place  Labs/imaging that I havepersonally reviewed  (right click and "Reselect all SmartList Selections" daily)   Na 153 CO2 25 BUN/Cr 67/3.04  WBC 21.3  CK 2415>1430  Imaging, labs and test noted above and in the last 24 hours have been reviewed independently by me.  Resolved Hospital Problem list   Anion gap acidosis secondary to uremia  Assessment & Plan:  Raymond Stone is a 70 y.o. gentleman who was found in his home after well check - no one had heard from him in over 5 days.  Acute metabolic encephalopathy - improving Suspect uremic encephalopathy, hypernatremia - Correct electrolyte abnormalities with mIVF - Minimize sedating meds, reorient  Acute Oliguric Kidney Injury - s/p CRRT. Improving - Nephrology consulted. Appreciate input. No further dialysis indicated - Continue mIVF. Monitor for evidence of volume overload - Monitor UOP/BMET  Sepsis secondary to aspiration pneumonitis/pneumonia - fever resolved Hypovolemic shock - resolved - Off pressors >24 hours - Continue Zosyn - Follow-up final culture data   Rhabdomyolysis - improving - Maintenance IVF for volume depletion - Trend CK - PRN tylenol for pain  Hypernatremia - improving  Hyperkalemia - resolved - continue maintenance fluids with LR and D5 - Follow-up PM BMET  HTN - hold home meds.   Severe protein calorie malnutrition - albumin <2  Severe deconditioning - Order PT  Hx ethanol abuse - CIWA  Eczema Barrier cream applied Continue moisturizer May need topical steroids  Best practice (right click and "Reselect all SmartList Selections" daily)   Diet:  Regular Pain/Anxiety/Delirium protocol (if indicated): N/A VAP protocol (if indicated): N/A DVT prophylaxis: Subcutaneous Heparin GI prophylaxis: Not indicated Glucose control:  SSI No Central venous access:  N/A Arterial line:  N/A Foley:  Yes, indicated for renal faliure Mobility:  bed rest  PT consulted: N/A Last date of multidisciplinary goals of care discussion: Updated family 4/6. Dr. Loanne Drilling Code Status:  full code Disposition: Transfer to PCU and [facility]   Labs   CBC: Recent Labs  Lab 10/12/20 1740 10/12/20 2136 10/13/20 0120 10/13/20 0256 10/14/20 0210 10/15/20 0322 10/16/20 0334  WBC 18.4*  --  18.7* 19.5* 13.5* 18.4* 21.3*  NEUTROABS 14.8*  --   --  15.7*  --   --   --   HGB 15.0   < > 11.1* 12.1* 10.6* 9.3* 9.3*  HCT 49.9   < > 37.6* 39.0 34.3* 29.9* 29.8*  MCV 111.6*  --  115.3* 109.2* 109.9* 107.2* 108.8*  PLT 523*  --  346 320 289 244 250   < > = values in this interval not displayed.    Basic Metabolic Panel: Recent Labs  Lab 10/13/20 0256 10/13/20 0535 10/14/20 0210 10/14/20 0904 10/14/20 1435 10/15/20 0322 10/15/20 1452 10/16/20 0334  NA  --    < > 156* 154* 153* 155* 154* 153*  K  --    < > 4.9 4.8 4.7 4.2 4.0 3.8  CL  --    < > 123* 120* 120* 124* 123* 121*  CO2  --    < > 21* 23 23 23 23 25   GLUCOSE  --    < > 111* 103* 112* 116* 199* 140*  BUN  --    < > 129* 106* 102* 95* 84* 67*  CREATININE  --    < > 5.96* 4.79* 4.78* 4.60* 3.88* 3.04*  CALCIUM  --    < > 8.3* 8.3* 8.4* 8.5* 8.9 8.9  MG 3.9*  --  2.7*  --   --  2.2  --  1.8  PHOS  --    < > 4.1 4.3 3.7 3.8  3.8 3.8 3.5   < > = values in this interval not displayed.   GFR: Estimated Creatinine Clearance: 22.9 mL/min (A) (by C-G formula based on SCr of 3.04 mg/dL (H)). Recent Labs  Lab 10/12/20 1740 10/12/20 2004 10/13/20 0120 10/13/20 0256 10/14/20 0210 10/15/20 0322 10/16/20 0334  WBC 18.4*  --    < > 19.5* 13.5* 18.4* 21.3*  LATICACIDVEN 7.8* 5.3*  --  3.1*  --    --   --    < > = values in this interval not displayed.    Liver Function Tests: Recent Labs  Lab 10/12/20 1854 10/13/20 0719 10/13/20 1559 10/14/20 0904 10/14/20 1435 10/15/20 0322 10/15/20 1452 10/16/20 0334  AST 118* 101*  --   --   --   --   --   --   ALT 42 33  --   --   --   --   --   --   ALKPHOS 61 43  --   --   --   --   --   --  BILITOT 1.5* 1.1  --   --   --   --   --   --   PROT 9.1* 6.3*  --   --   --   --   --   --   ALBUMIN 2.2* 1.6*   < > 1.5* 1.4* 1.4* 1.5* 1.4*   < > = values in this interval not displayed.   No results for input(s): LIPASE, AMYLASE in the last 168 hours. Recent Labs  Lab 10/12/20 1826  AMMONIA 46*    ABG    Component Value Date/Time   PHART 7.370 10/12/2020 2136   PCO2ART 26.0 (L) 10/12/2020 2136   PO2ART 96 10/12/2020 2136   HCO3 15.0 (L) 10/12/2020 2136   TCO2 16 (L) 10/12/2020 2136   ACIDBASEDEF 9.0 (H) 10/12/2020 2136   O2SAT 97.0 10/12/2020 2136     Coagulation Profile: Recent Labs  Lab 10/12/20 1740  INR 1.6*    Cardiac Enzymes: Recent Labs  Lab 10/12/20 1826 10/13/20 0256 10/15/20 0322  CKTOTAL 2,442* 2,415* 1,430*    HbA1C: Hemoglobin A1C  Date/Time Value Ref Range Status  01/23/2020 09:16 AM 5.3 4.0 - 5.6 % Final  01/31/2014 11:43 AM 5.8  Final   Hgb A1c MFr Bld  Date/Time Value Ref Range Status  05/22/2017 02:02 PM 6.0 (H) 4.8 - 5.6 % Final    Comment:    (NOTE) Pre diabetes:          5.7%-6.4% Diabetes:              >6.4% Glycemic control for   <7.0% adults with diabetes     CBG: Recent Labs  Lab 10/15/20 1131 10/15/20 1530 10/15/20 2030 10/16/20 0011 10/16/20 0756  GLUCAP 153* 162* 151* 132* 116*   Care Time: 35 minutes  Rodman Pickle, M.D. [facility] Pulmonary/Critical Care Medicine 10/16/2020 8:48 AM

## 2020-10-16 NOTE — Progress Notes (Signed)
Pharmacy Antibiotic Note  Raymond Stone is a 70 y.o. male admitted on 10/12/2020 with sepsis.  Pharmacy has been consulted for vancomycin and zosyn dosing. Vancomycin stopped on 4/4.  WBC 21.3 trending up on day 5 of Zosyn. No longer febrile. Off CRRT with improving renal function, Scr 3 today with CrCl 22.9 ml/min. Cultures ngtd.  Plan: Adjust Zosyn to 3.375 gm IV q8 hrs  Monitor renal function, cultures, LOT  Height: 5\' 9"  (175.3 cm) Weight: 78.1 kg (172 lb 2.9 oz) IBW/kg (Calculated) : 70.7  Temp (24hrs), Avg:98.2 F (36.8 C), Min:97.9 F (36.6 C), Max:98.7 F (37.1 C)  Recent Labs  Lab 10/12/20 1740 10/12/20 1854 10/12/20 2004 10/12/20 2130 10/13/20 0120 10/13/20 0256 10/13/20 0719 10/14/20 0210 10/14/20 0904 10/14/20 1435 10/15/20 0322 10/15/20 1452 10/16/20 0334  WBC 18.4*  --   --   --  18.7* 19.5*  --  13.5*  --   --  18.4*  --  21.3*  CREATININE  --    < >  --    < >  --   --    < > 5.96* 4.79* 4.78* 4.60* 3.88* 3.04*  LATICACIDVEN 7.8*  --  5.3*  --   --  3.1*  --   --   --   --   --   --   --    < > = values in this interval not displayed.    Estimated Creatinine Clearance: 22.9 mL/min (A) (by C-G formula based on SCr of 3.04 mg/dL (H)).    Allergies  Allergen Reactions  . Lisinopril Swelling    ANGIOEDEMA   Antimicrobials this admission:  Vanc 4/2>> 4/4 Zosyn 4/2>>  Dose adjustments this admission:  N/A  Microbiology results:  4/2 Bcx: ngtd  4/3 MRSA PCR: neg   Richardine Service, PharmD, BCPS PGY2 Cardiology Pharmacy Resident Phone: [phone] 10/16/2020  11:39 AM  Please check [email or web address] for unit-specific pharmacy phone numbers.

## 2020-10-16 NOTE — Social Work (Signed)
CSW consulted for substance use resources. CSW currently unable to complete assessment. CSW will continue to follow and will complete assessment when appropriate.

## 2020-10-16 NOTE — Progress Notes (Signed)
  Speech Language Pathology Treatment: Dysphagia  Patient Details Name: Raymond Stone MRN: [id number] DOB: [birth year] Today's Date: 10/16/2020 Time: 4680-3212 SLP Time Calculation (min) (ACUTE ONLY): 11 min  Assessment / Plan / Recommendation Clinical Impression  Pt was agreeable to PO intake of solid textures today after SLP encouragement. No overt s/s of aspiration were noted with thin liquids or solids. His oropharyngeal swallow also appeared functional for solids and he reports that he had no textural modifications PTA. Based on these findings, recommend that the pt's diet be upgraded to regular solids and thin liquids, especially given that he is afebrile and his breath sounds have been clear and diminished.    HPI HPI: 70 yo man with hx of HTN, lives alone, found down after not being seen for five days and admitted 4/2. Dx acute metabolic encephalopathy, acute kidney injury, hypovolemic shock, rhabdomylosis.      SLP Plan  Continue with current plan of care       Recommendations  Diet recommendations: Regular;Thin liquid Liquids provided via: Cup;Straw Medication Administration: Whole meds with liquid Supervision: Full supervision/cueing for compensatory strategies;Staff to assist with self feeding Compensations: Slow rate;Small sips/bites Postural Changes and/or Swallow Maneuvers: Seated upright 90 degrees                Oral Care Recommendations: Oral care BID Follow up Recommendations:  (tba) SLP Visit Diagnosis: Dysphagia, unspecified (R13.10) Plan: Continue with current plan of care       GO                Jeanine Luz., SLP Student 10/16/2020, 11:39 AM

## 2020-10-16 NOTE — Progress Notes (Signed)
Spoke with RN Shaun and he is aware of the [facility] removal.

## 2020-10-16 NOTE — Progress Notes (Signed)
Physical Therapy Wound Treatment Patient Details  Name: Raymond Stone MRN: [id number] Date of Birth: [birth year]  Today's Date: 10/16/2020 Time: 2094-7096 Time Calculation (min): 20 min  Subjective  Subjective Assessment Subjective: Pt pleasant and agreeable to hydrotherapy. Difficult to understand at times as voice is soft and hoarse sounding. Patient and Family Stated Goals: None stated Date of Onset:  (Unsure) Prior Treatments: Dressing changes prior to hydro being consulted.  Pain Score:  Pt tolerated treatment without complaints of pain without premedication.   Wound Assessment  Pressure Injury 10/13/20 Flank Left;Mid Unstageable - Full thickness tissue loss in which the base of the injury is covered by slough (yellow, tan, gray, green or brown) and/or eschar (tan, brown or black) in the wound bed. (Active)  Dressing Type Barrier Film (skin prep);Foam - Lift dressing to assess site every shift;Gauze (Comment);Moist to moist;Santyl 10/16/20 1350  Dressing Changed;Clean;Dry;Intact 10/16/20 1350  Dressing Change Frequency Daily 10/16/20 1350  State of Healing Eschar 10/16/20 1350  Site / Wound Assessment Black;Yellow 10/16/20 1350  % Wound base Red or Granulating 0% 10/16/20 1350  % Wound base Yellow/Fibrinous Exudate 25% 10/16/20 1350  % Wound base Black/Eschar 75% 10/16/20 1350  % Wound base Other/Granulation Tissue (Comment) 0% 10/16/20 1350  Peri-wound Assessment Intact;Black 10/16/20 1350  Wound Length (cm) 5.5 cm 10/15/20 1200  Wound Width (cm) 2 cm 10/15/20 1200  Wound Depth (cm) 0.1 cm 10/15/20 1200  Wound Surface Area (cm^2) 11 cm^2 10/15/20 1200  Wound Volume (cm^3) 1.1 cm^3 10/15/20 1200  Tunneling (cm) 0 10/15/20 1349  Undermining (cm) 0 10/15/20 1349  Margins Unattached edges (unapproximated) 10/16/20 1350  Drainage Amount Scant 10/16/20 1350  Drainage Description Serosanguineous 10/16/20 1350  Treatment Debridement (Selective);Hydrotherapy (Pulse lavage);Packing  (Saline gauze) 10/16/20 1350      Hydrotherapy Pulsed lavage therapy - wound location: L flank Pulsed Lavage with Suction (psi): 8 psi Pulsed Lavage with Suction - Normal Saline Used: 1000 mL Pulsed Lavage Tip: Tip with splash shield Selective Debridement Selective Debridement - Location: L flank Selective Debridement - Tools Used: Scalpel,Forceps Selective Debridement - Tissue Removed: black eschar    Wound Assessment and Plan  Wound Therapy - Assess/Plan/Recommendations Wound Therapy - Clinical Statement: Only L flank wound addressed this session. Pt tolerated treatment well without premedication and declined medication when it was offered. Thin layer of black eschar able to be removed this session. This patient will benefit from continued hydrotherapy for selective removal of unviable tissue, to decrease bioburden, and promote wound bed healing. Wound Therapy - Functional Problem List: Global weakness in the setting of undetermined time down and critical illness. Factors Delaying/Impairing Wound Healing: Infection - systemic/local,Immobility Hydrotherapy Plan: Debridement,Dressing change,Patient/family education,Pulsatile lavage with suction Wound Therapy - Frequency: 6X / week Wound Therapy - Current Recommendations: PT Wound Therapy - Follow Up Recommendations: dressing changes by RN  Wound Therapy Goals- Improve the function of patient's integumentary system by progressing the wound(s) through the phases of wound healing (inflammation - proliferation - remodeling) by: Wound Therapy Goals - Improve the function of patient's integumentary system by progressing the wound(s) through the phases of wound healing by: Decrease Necrotic Tissue to: 20% Decrease Necrotic Tissue - Progress: Progressing toward goal Increase Granulation Tissue to: 80% Increase Granulation Tissue - Progress: Progressing toward goal Goals/treatment plan/discharge plan were made with and agreed upon by  patient/family: No, Patient unable to participate in goals/treatment/discharge plan and family unavailable Time For Goal Achievement: 7 days Wound Therapy - Potential for Goals: Good  Goals will  be updated until maximal potential achieved or discharge criteria met.  Discharge criteria: when goals achieved, discharge from hospital, MD decision/surgical intervention, no progress towards goals, refusal/missing three consecutive treatments without notification or medical reason.  GP     Charges PT Wound Care Charges $Wound Debridement up to 20 cm: < or equal to 20 cm $PT PLS Gun and Tip: 1 Supply $PT Hydrotherapy Visit: 1 Visit       Thelma Comp 10/16/2020, 1:57 PM   Rolinda Roan, PT, DPT Acute Rehabilitation Services Pager: [phone] Office: [phone]

## 2020-10-16 NOTE — Progress Notes (Signed)
Nutrition Follow-up  DOCUMENTATION CODES:   Non-severe (moderate) malnutrition in context of chronic illness  INTERVENTION:   Increase Ensure Enlive po to TID with meals and at bedtime, each supplement provides 350 kcal and 20 grams of protein  Continue Magic cup TID with meals, each supplement provides 290 kcal and 9 grams of protein  Continue MVI with Minerals  Consider Cortrak placement if po intake does not improve  NUTRITION DIAGNOSIS:   Moderate Malnutrition related to chronic illness (CHF) as evidenced by mild fat depletion,moderate muscle depletion.  Being addressed via supplements  GOAL:   Patient will meet greater than or equal to 90% of their needs  Progressing  MONITOR:   PO intake,Supplement acceptance,Weight trends,Labs,I & O's,Diet advancement,Skin  REASON FOR ASSESSMENT:   Malnutrition Screening Tool    ASSESSMENT:   Patient with PMH significant for HTN, peripheral edema, CHF, and alcohol use disorder. Presents this admission with suspected uremic encephalopathy and AKI.  4/3- start CRRT 4/4- stop CRRT due to pressure alarming  HD cath removed today  Pt did not want to eat breakfast this AM. Pt drinking fluids very well per RN, drinking Ensure.  Recorded po intake 0-75% of meals. Noted diet advanced to Regular/Thins from Dysphagia 1/Thins after breakfast this AM  Labs: sodium 153 (H) Meds: D5 at 100 ml/hr, MVI with Minerals   Diet Order:   Diet Order            Diet regular Room service appropriate? Yes with Assist; Fluid consistency: Thin  Diet effective now                 EDUCATION NEEDS:   Not appropriate for education at this time  Skin:  Skin Assessment: Skin Integrity Issues: Skin Integrity Issues:: DTI,Other (Comment),Stage I,Stage II DTI: L flank, R toe, bilateral ankles, bilateral knees Stage I: R flank Stage II: R knee, L thigh Other: skin tear scrotum  Last BM:  4/3  Height:   Ht Readings from Last 1 Encounters:   10/13/20 5\' 9"  (1.753 m)    Weight:   Wt Readings from Last 1 Encounters:  10/16/20 78.1 kg     BMI:  Body mass index is 25.43 kg/m.  Estimated Nutritional Needs:   Kcal:  2300-2500 kcal  Protein:  115-130 grams  Fluid:  >/= 2 L/day   Kerman Passey MS, RDN, LDN, CNSC Registered Dietitian III Clinical Nutrition RD Pager and On-Call Pager Number Located in [place]

## 2020-10-17 ENCOUNTER — Other Ambulatory Visit: Payer: Self-pay

## 2020-10-17 ENCOUNTER — Encounter (HOSPITAL_COMMUNITY): Payer: Self-pay | Admitting: Pulmonary Disease

## 2020-10-17 DIAGNOSIS — L89300 Pressure ulcer of unspecified buttock, unstageable: Secondary | ICD-10-CM | POA: Diagnosis not present

## 2020-10-17 DIAGNOSIS — N179 Acute kidney failure, unspecified: Secondary | ICD-10-CM | POA: Diagnosis not present

## 2020-10-17 DIAGNOSIS — E440 Moderate protein-calorie malnutrition: Secondary | ICD-10-CM | POA: Diagnosis not present

## 2020-10-17 LAB — CBC
HCT: 28.5 % — ABNORMAL LOW (ref 39.0–52.0)
Hemoglobin: 9.3 g/dL — ABNORMAL LOW (ref 13.0–17.0)
MCH: 33.3 pg (ref 26.0–34.0)
MCHC: 32.6 g/dL (ref 30.0–36.0)
MCV: 102.2 fL — ABNORMAL HIGH (ref 80.0–100.0)
Platelets: 264 10*3/uL (ref 150–400)
RBC: 2.79 MIL/uL — ABNORMAL LOW (ref 4.22–5.81)
RDW: 13.8 % (ref 11.5–15.5)
WBC: 17.7 10*3/uL — ABNORMAL HIGH (ref 4.0–10.5)
nRBC: 0 % (ref 0.0–0.2)

## 2020-10-17 LAB — CULTURE, BLOOD (ROUTINE X 2)
Culture: NO GROWTH
Culture: NO GROWTH
Special Requests: ADEQUATE

## 2020-10-17 LAB — BASIC METABOLIC PANEL
Anion gap: 8 (ref 5–15)
BUN: 43 mg/dL — ABNORMAL HIGH (ref 8–23)
CO2: 23 mmol/L (ref 22–32)
Calcium: 8.7 mg/dL — ABNORMAL LOW (ref 8.9–10.3)
Chloride: 110 mmol/L (ref 98–111)
Creatinine, Ser: 2 mg/dL — ABNORMAL HIGH (ref 0.61–1.24)
GFR, Estimated: 35 mL/min — ABNORMAL LOW (ref >60)
Glucose, Bld: 123 mg/dL — ABNORMAL HIGH (ref 70–99)
Potassium: 3.5 mmol/L (ref 3.5–5.1)
Sodium: 141 mmol/L (ref 135–145)

## 2020-10-17 LAB — GLUCOSE, CAPILLARY
Glucose-Capillary: 139 mg/dL — ABNORMAL HIGH (ref 70–99)
Glucose-Capillary: 112 mg/dL — ABNORMAL HIGH (ref 70–99)
Glucose-Capillary: 112 mg/dL — ABNORMAL HIGH (ref 70–99)
Glucose-Capillary: 95 mg/dL (ref 70–99)
Glucose-Capillary: 107 mg/dL — ABNORMAL HIGH (ref 70–99)

## 2020-10-17 LAB — CK: Total CK: 611 U/L — ABNORMAL HIGH (ref 49–397)

## 2020-10-17 LAB — MAGNESIUM: Magnesium: 1.4 mg/dL — ABNORMAL LOW (ref 1.7–2.4)

## 2020-10-17 LAB — PHOSPHORUS: Phosphorus: 2.4 mg/dL — ABNORMAL LOW (ref 2.5–4.6)

## 2020-10-17 MED ORDER — ENOXAPARIN SODIUM 40 MG/0.4ML ~~LOC~~ SOLN
40.0000 mg | SUBCUTANEOUS | Status: DC
  Administered 2020-10-17 – 2020-11-05 (×19): 40 mg via SUBCUTANEOUS
  Filled 2020-10-17 (×18): qty 0.4

## 2020-10-17 MED ORDER — POTASSIUM CHLORIDE CRYS ER 20 MEQ PO TBCR: 40.0000 meq | EXTENDED_RELEASE_TABLET | Freq: Once | ORAL | Status: DC

## 2020-10-17 MED ORDER — MAGNESIUM SULFATE 2 GM/50ML IV SOLN
2.0000 g | Freq: Once | INTRAVENOUS | Status: AC
  Administered 2020-10-17: 2 g via INTRAVENOUS
  Filled 2020-10-17: qty 50

## 2020-10-17 MED ORDER — ALBUMIN HUMAN 25 % IV SOLN
12.5000 g | Freq: Once | INTRAVENOUS | Status: AC
  Administered 2020-10-17: 12.5 g via INTRAVENOUS
  Filled 2020-10-17: qty 50

## 2020-10-17 MED ORDER — MAGNESIUM SULFATE 4 GM/100ML IV SOLN: 4.0000 g | Freq: Once | INTRAVENOUS | Status: DC

## 2020-10-17 MED ORDER — LORAZEPAM 1 MG PO TABS
1.0000 mg | ORAL_TABLET | ORAL | Status: DC | PRN
  Administered 2020-10-22: 1 mg via ORAL
  Filled 2020-10-17: qty 1

## 2020-10-17 MED ORDER — CLONAZEPAM 0.5 MG PO TABS
0.5000 mg | ORAL_TABLET | Freq: Two times a day (BID) | ORAL | Status: DC
  Administered 2020-10-17 – 2020-10-24 (×9): 0.5 mg via ORAL
  Filled 2020-10-17 (×14): qty 1

## 2020-10-17 MED ORDER — THIAMINE HCL 100 MG PO TABS
100.0000 mg | ORAL_TABLET | Freq: Every day | ORAL | Status: DC
  Administered 2020-10-17: 100 mg via ORAL
  Filled 2020-10-17 (×2): qty 1

## 2020-10-17 NOTE — Progress Notes (Signed)
Telephone report given to Gevena Barre RN at this time.

## 2020-10-17 NOTE — Progress Notes (Signed)
PROGRESS NOTE    Rondell Pardon  RKY:[id number] DOB: [birth year] DOA: 10/12/2020 PCP: [name], NP    Brief Narrative:  Mr. Signor was admitted to the hospital with working diagnosis hypovolemic shock complicated by acute kidney injury/ATN.   70 year old male past medical history of hypertension who was found down, apparently he was not on scene for about 5 days.  When EMS arrived he was severely hypotensive, his temperature was 100 F he received intravenous fluids and transported to the emergency room.  On his initial physical examination his blood pressure was 111/66, heart rate 109, temperature 37.8 C, respiratory rate 29, oxygen saturation 95% he was confused and disoriented, dry mucous membranes, lungs were clear to auscultation bilaterally, heart S1-S2, present, rhythmic, no gallops rubs or murmurs, his abdomen was soft, no lower extremity edema.  He was neurologically nonfocal.  Sodium 172, potassium 7.3, chloride >130, bicarb 15, glucose 156, BUN 211, creatinine 11.2, AST 118, ALT 42, total bilirubin is 1.5, troponin I 489-395, lactic acid 5.3. anion gap 49.  ABG, pH 7.37, PCO2 26, PO2 96, bicarb 15 White cell count 18.7, hemoglobin 11.1, hematocrit 37.6, platelets 346. SARS COVID-19 negative. Urinalysis specific gravity 1.026, negative protein, 21-50 white cells, 0-5 red cells. Toxicology negative. Head CT negative for acute changes.  Chest radiograph no infiltrates. EKG had 19 bpm, normal axis, normal intervals, sinus rhythm, no ST segment or T wave changes.  Patient was diagnosed with shock, he received intravenous fluids and broad-spectrum antibiotic therapy. Patient underwent CRRT 04/03 with further improvement of his kidney function.  Transferred to [facility] 04/07.    Assessment & Plan:   Active Problems:   AKI (acute kidney injury) ([place])   Pressure injury of skin   Malnutrition of moderate degree   1. Hypovolemic shock with AKI/ ATN, uremic encephalopathy,  hyperkalemia, hypernatremia, anion gap metabolic acidosis. Hypomagnesemia.  Blood pressure 139/75 mmHg this am with HR 107 to 120 bpm.  EKG with sinus tachycardia.  Renal function with serum cr at 2,0 with K at 3,5 and serum bicarbonate at 23. NA 141 with Mg is 1,4. Urine output 1,350 ml Patient tolerating po well.  HD cathter has been removed.  CK 611, Peak CK 2,415 mild rhabdomyolysis likely not the culprit of renal failure.   Will discontinue IV fluids now and follow on renal function in am, correct Mg with mag sulfate.  Avoid hypotension and nephrotoxic medications.   2. HTN. Continue blood pressure monitoring, continue to hold on antihypertensive medications.   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 Positive sinus tachycardia, possible early signs of withdrawal, will continue with as needed lorazepam.   Add bid clonazepam for now.   4. Moderate calorie protein malnutrition. Continue calorie protein malnutrition.   5. Aspiration pneumonitis (present on admission). Oxygenation well at 99% on room air. Antibiotics have been discontinued.  Ruled out for bacterial pneumonia. Ruled out sepsis.   6. Multiple skin pressure wounds present on admission Upper and lower extremities. Continue local skin care, consult PT and OT.    Status is: Inpatient  Remains inpatient appropriate because:IV treatments appropriate due to intensity of illness or inability to take PO   Dispo: The patient is from: Home              Anticipated d/c is to: SNF              Patient currently is not medically stable to d/c.   Difficult to place  patient No   DVT prophylaxis: enoxaparin  Code Status:   full  Family Communication:  No family at the bedside      Nutrition Status: Nutrition Problem: Moderate Malnutrition Etiology: chronic illness (CHF) Signs/Symptoms: mild fat depletion,moderate muscle depletion Interventions: Ensure Enlive (each supplement provides 350kcal  and 20 grams of protein),Magic cup,MVI     Skin Documentation: Pressure Injury 10/13/20 Thigh Anterior;Left;Proximal Unstageable - Full thickness tissue loss in which the base of the injury is covered by slough (yellow, tan, gray, green or brown) and/or eschar (tan, brown or black) in the wound bed. (Active)  10/13/20 0200  Location: Thigh  Location Orientation: Anterior;Left;Proximal  Staging: Unstageable - Full thickness tissue loss in which the base of the injury is covered by slough (yellow, tan, gray, green or brown) and/or eschar (tan, brown or black) in the wound bed.  Wound Description (Comments):   Present on Admission: Yes     Pressure Injury 10/13/20 Knee Anterior;Left Deep Tissue Pressure Injury - Purple or maroon localized area of discolored intact skin or blood-filled blister due to damage of underlying soft tissue from pressure and/or shear. (Active)  10/13/20 0200  Location: Knee  Location Orientation: Anterior;Left  Staging: Deep Tissue Pressure Injury - Purple or maroon localized area of discolored intact skin or blood-filled blister due to damage of underlying soft tissue from pressure and/or shear.  Wound Description (Comments):   Present on Admission: Yes     Pressure Injury 10/13/20 Flank Left;Upper Unstageable - Full thickness tissue loss in which the base of the injury is covered by slough (yellow, tan, gray, green or brown) and/or eschar (tan, brown or black) in the wound bed. (Active)  10/13/20 0200  Location: Flank  Location Orientation: Left;Upper  Staging: Unstageable - Full thickness tissue loss in which the base of the injury is covered by slough (yellow, tan, gray, green or brown) and/or eschar (tan, brown or black) in the wound bed.  Wound Description (Comments):   Present on Admission: Yes     Pressure Injury 10/13/20 Toe (Comment  which one) Anterior;Right Deep Tissue Pressure Injury - Purple or maroon localized area of discolored intact skin or  blood-filled blister due to damage of underlying soft tissue from pressure and/or shear. (Active)  10/13/20 0200  Location: Toe (Comment  which one)  Location Orientation: Anterior;Right  Staging: Deep Tissue Pressure Injury - Purple or maroon localized area of discolored intact skin or blood-filled blister due to damage of underlying soft tissue from pressure and/or shear.  Wound Description (Comments):   Present on Admission: Yes     Pressure Injury 10/13/20 Knee Anterior;Right Unstageable - Full thickness tissue loss in which the base of the injury is covered by slough (yellow, tan, gray, green or brown) and/or eschar (tan, brown or black) in the wound bed. (Active)  10/13/20 0800  Location: Knee  Location Orientation: Anterior;Right  Staging: Unstageable - Full thickness tissue loss in which the base of the injury is covered by slough (yellow, tan, gray, green or brown) and/or eschar (tan, brown or black) in the wound bed.  Wound Description (Comments):   Present on Admission: Yes     Pressure Injury 10/13/20 Ankle Posterior;Right Deep Tissue Pressure Injury - Purple or maroon localized area of discolored intact skin or blood-filled blister due to damage of underlying soft tissue from pressure and/or shear. (Active)  10/13/20 1900  Location: Ankle  Location Orientation: Posterior;Right  Staging: Deep Tissue Pressure Injury - Purple or maroon localized area of discolored  intact skin or blood-filled blister due to damage of underlying soft tissue from pressure and/or shear.  Wound Description (Comments):   Present on Admission: Yes     Pressure Injury 10/13/20 Knee Left;Lateral Deep Tissue Pressure Injury - Purple or maroon localized area of discolored intact skin or blood-filled blister due to damage of underlying soft tissue from pressure and/or shear. (Active)  10/13/20 1900  Location: Knee  Location Orientation: Left;Lateral  Staging: Deep Tissue Pressure Injury - Purple or maroon  localized area of discolored intact skin or blood-filled blister due to damage of underlying soft tissue from pressure and/or shear.  Wound Description (Comments):   Present on Admission: Yes     Pressure Injury 10/13/20 Flank Left;Mid Unstageable - Full thickness tissue loss in which the base of the injury is covered by slough (yellow, tan, gray, green or brown) and/or eschar (tan, brown or black) in the wound bed. (Active)  10/13/20 1900  Location: Flank  Location Orientation: Left;Mid  Staging: Unstageable - Full thickness tissue loss in which the base of the injury is covered by slough (yellow, tan, gray, green or brown) and/or eschar (tan, brown or black) in the wound bed.  Wound Description (Comments):   Present on Admission: Yes     Pressure Injury 10/13/20 Elbow Left;Posterior Deep Tissue Pressure Injury - Purple or maroon localized area of discolored intact skin or blood-filled blister due to damage of underlying soft tissue from pressure and/or shear. (Active)  10/13/20 1900  Location: Elbow  Location Orientation: Left;Posterior  Staging: Deep Tissue Pressure Injury - Purple or maroon localized area of discolored intact skin or blood-filled blister due to damage of underlying soft tissue from pressure and/or shear.  Wound Description (Comments):   Present on Admission: Yes     Pressure Injury 10/13/20 Elbow Posterior;Right Deep Tissue Pressure Injury - Purple or maroon localized area of discolored intact skin or blood-filled blister due to damage of underlying soft tissue from pressure and/or shear. (Active)  10/13/20 1900  Location: Elbow  Location Orientation: Posterior;Right  Staging: Deep Tissue Pressure Injury - Purple or maroon localized area of discolored intact skin or blood-filled blister due to damage of underlying soft tissue from pressure and/or shear.  Wound Description (Comments):   Present on Admission: Yes     Consultants:   Nephrology   Procedures:   HD  cathter (removed)     Subjective: Patient is feeling better but not yet back to baseline, no nausea or vomiting, does not recall the events prior to his hospitalization.   Objective: Vitals:   10/17/20 0804 10/17/20 0846 10/17/20 0854 10/17/20 1200  BP: 123/62 123/62 (!) 105/54 139/75  Pulse: (!) 114 (!) 114 (!) 123 (!) 107  Resp: 16 16 16 17   Temp: 98.1 F (36.7 C) 98.1 F (36.7 C) 98.7 F (37.1 C) (!) 97.2 F (36.2 C)  TempSrc:   Oral Oral  SpO2:      Weight:      Height:        Intake/Output Summary (Last 24 hours) at 10/17/2020 1327 Last data filed at 10/17/2020 0315 Gross per 24 hour  Intake 2012.62 ml  Output 950 ml  Net 1062.62 ml   Filed Weights   10/14/20 0500 10/15/20 0500 10/16/20 0500  Weight: 76.7 kg 78 kg 78.1 kg    Examination:   General: Not in pain or dyspnea, deconditioned  Neurology: Awake and alert, non focal  E ENT: no pallor, no icterus, oral mucosa moist Cardiovascular: No JVD.  S1-S2 present, rhythmic, no gallops, rubs, or murmurs. No lower extremity edema. Pulmonary: positive breath sounds bilaterally, no wheezing, rhonchi or rales. Gastrointestinal. Abdomen soft and non tender Skin. No rashes Musculoskeletal: no joint deformities     Data Reviewed: I have personally reviewed following labs and imaging studies  CBC: Recent Labs  Lab 10/12/20 1740 10/12/20 2136 10/13/20 0256 10/14/20 0210 10/15/20 0322 10/16/20 0334 10/17/20 0336  WBC 18.4*   < > 19.5* 13.5* 18.4* 21.3* 17.7*  NEUTROABS 14.8*  --  15.7*  --   --   --   --   HGB 15.0   < > 12.1* 10.6* 9.3* 9.3* 9.3*  HCT 49.9   < > 39.0 34.3* 29.9* 29.8* 28.5*  MCV 111.6*   < > 109.2* 109.9* 107.2* 108.8* 102.2*  PLT 523*   < > 320 289 244 250 264   < > = values in this interval not displayed.   Basic Metabolic Panel: Recent Labs  Lab 10/13/20 0256 10/13/20 0535 10/14/20 0210 10/14/20 0904 10/15/20 0322 10/15/20 1452 10/16/20 0334 10/16/20 1422 10/17/20 0336  NA  --     < > 156*   < > 155* 154* 153* 144 141  K  --    < > 4.9   < > 4.2 4.0 3.8 3.8 3.5  CL  --    < > 123*   < > 124* 123* 121* 114* 110  CO2  --    < > 21*   < > 23 23 25 22 23   GLUCOSE  --    < > 111*   < > 116* 199* 140* 135* 123*  BUN  --    < > 129*   < > 95* 84* 67* 54* 43*  CREATININE  --    < > 5.96*   < > 4.60* 3.88* 3.04* 2.40* 2.00*  CALCIUM  --    < > 8.3*   < > 8.5* 8.9 8.9 8.6* 8.7*  MG 3.9*  --  2.7*  --  2.2  --  1.8  --  1.4*  PHOS  --    < > 4.1   < > 3.8  3.8 3.8 3.5 2.9 2.4*   < > = values in this interval not displayed.   GFR: Estimated Creatinine Clearance: 34.9 mL/min (A) (by C-G formula based on SCr of 2 mg/dL (H)). Liver Function Tests: Recent Labs  Lab 10/12/20 1854 10/13/20 0719 10/13/20 1559 10/14/20 1435 10/15/20 0322 10/15/20 1452 10/16/20 0334 10/16/20 1422  AST 118* 101*  --   --   --   --   --   --   ALT 42 33  --   --   --   --   --   --   ALKPHOS 61 43  --   --   --   --   --   --   BILITOT 1.5* 1.1  --   --   --   --   --   --   PROT 9.1* 6.3*  --   --   --   --   --   --   ALBUMIN 2.2* 1.6*   < > 1.4* 1.4* 1.5* 1.4* 1.4*   < > = values in this interval not displayed.   No results for input(s): LIPASE, AMYLASE in the last 168 hours. Recent Labs  Lab 10/12/20 1826  AMMONIA 46*   Coagulation Profile: Recent Labs  Lab 10/12/20 1740  INR 1.6*   Cardiac Enzymes: Recent Labs  Lab 10/12/20 1826 10/13/20 0256 10/15/20 0322 10/17/20 0336  CKTOTAL 2,442* 2,415* 1,430* 611*   BNP (last 3 results) No results for input(s): PROBNP in the last 8760 hours. HbA1C: No results for input(s): HGBA1C in the last 72 hours. CBG: Recent Labs  Lab 10/16/20 1129 10/16/20 1542 10/16/20 2032 10/17/20 0538 10/17/20 1203  GLUCAP 126* 135* 139* 112* 112*   Lipid Profile: No results for input(s): CHOL, HDL, LDLCALC, TRIG, CHOLHDL, LDLDIRECT in the last 72 hours. Thyroid Function Tests: No results for input(s): TSH, T4TOTAL, FREET4, T3FREE,  THYROIDAB in the last 72 hours. Anemia Panel: No results for input(s): VITAMINB12, FOLATE, FERRITIN, TIBC, IRON, RETICCTPCT in the last 72 hours.    Radiology Studies: I have reviewed all of the imaging during this hospital visit personally     Scheduled Meds: . chlorhexidine  15 mL Mouth Rinse BID  . Chlorhexidine Gluconate Cloth  6 each Topical Q0600  . collagenase   Topical Daily  . feeding supplement  237 mL Oral TID WC & HS  . heparin  5,000 Units Subcutaneous Q8H  . mouth rinse  15 mL Mouth Rinse q12n4p  . multivitamin with minerals  1 tablet Oral Daily  . sodium chloride flush  10-40 mL Intracatheter Q12H   Continuous Infusions: . sodium chloride Stopped (10/14/20 0859)  . dextrose 100 mL/hr at 10/17/20 2194     LOS: 5 days        Keeyon Privitera Gerome Apley, MD

## 2020-10-17 NOTE — Care Management Important Message (Signed)
Important Message  Patient Details  Name: Raymond Stone MRN: [id number] Date of Birth: [birth year]   Medicare Important Message Given:  Yes     Shelda Altes 10/17/2020, 11:22 AM

## 2020-10-17 NOTE — Progress Notes (Signed)
Physical Therapy Wound Treatment Patient Details  Name: Raymond Stone MRN: [id number] Date of Birth: 15-May-1951  Today's Date: 10/17/2020 Time: 1130-1201 Time Calculation (min): 31 min  Subjective  Subjective Assessment Subjective: Pt pleasant and agreeable to hydrotherapy. Difficult to understand at times as voice is soft and hoarse sounding. Patient and Family Stated Goals: None stated Date of Onset:  (Unsure) Prior Treatments: Dressing changes prior to hydro being consulted.  Pain Score:  Pt declined premedication. No complaints of pain throughout session.   Wound Assessment  Pressure Injury 10/13/20 Flank Left;Mid Unstageable - Full thickness tissue loss in which the base of the injury is covered by slough (yellow, tan, gray, green or brown) and/or eschar (tan, brown or black) in the wound bed. (Active)  Dressing Type Barrier Film (skin prep);Foam - Lift dressing to assess site every shift;Gauze (Comment);Moist to moist;Santyl 10/17/20 1214  Dressing Changed;Clean;Dry;Intact 10/17/20 1214  Dressing Change Frequency Daily 10/17/20 1214  State of Healing Eschar 10/17/20 1214  Site / Wound Assessment Pink;Yellow;Brown 10/17/20 1214  % Wound base Red or Granulating 15% 10/17/20 1214  % Wound base Yellow/Fibrinous Exudate 70% 10/17/20 1214  % Wound base Black/Eschar 15% 10/17/20 1214  % Wound base Other/Granulation Tissue (Comment) 0% 10/17/20 1214  Peri-wound Assessment Intact;Black 10/17/20 1214  Wound Length (cm) 5.5 cm 10/15/20 1200  Wound Width (cm) 2 cm 10/15/20 1200  Wound Depth (cm) 0.1 cm 10/15/20 1200  Wound Surface Area (cm^2) 11 cm^2 10/15/20 1200  Wound Volume (cm^3) 1.1 cm^3 10/15/20 1200  Tunneling (cm) 0 10/15/20 1349  Undermining (cm) 0 10/15/20 1349  Margins Unattached edges (unapproximated) 10/17/20 1214  Drainage Amount Scant 10/17/20 1214  Drainage Description Serosanguineous 10/17/20 1214  Treatment Debridement (Selective);Hydrotherapy (Pulse lavage);Packing  (Saline gauze) 10/17/20 1214      Hydrotherapy Pulsed lavage therapy - wound location: L flank Pulsed Lavage with Suction (psi): 8 psi Pulsed Lavage with Suction - Normal Saline Used: 1000 mL Pulsed Lavage Tip: Tip with splash shield Selective Debridement Selective Debridement - Location: L flank Selective Debridement - Tools Used: Scalpel,Forceps Selective Debridement - Tissue Removed: black eschar and yellow slough    Wound Assessment and Plan  Wound Therapy - Assess/Plan/Recommendations Wound Therapy - Clinical Statement: Pt again tolerated treatment well without complaints of pain however a little more confused and starting to become agitated at end of session. Minimal unviable tissue able to be removed from wound however it is still adherent. RN student present and asked her to pass along the request for an air mattress. This patient will benefit from continued hydrotherapy for selective removal of unviable tissue, to decrease bioburden, and promote wound bed healing. Wound Therapy - Functional Problem List: Global weakness in the setting of undetermined time down and critical illness. Factors Delaying/Impairing Wound Healing: Infection - systemic/local,Immobility Hydrotherapy Plan: Debridement,Dressing change,Patient/family education,Pulsatile lavage with suction Wound Therapy - Frequency: 6X / week Wound Therapy - Current Recommendations: PT Wound Therapy - Follow Up Recommendations: dressing changes by RN  Wound Therapy Goals- Improve the function of patient's integumentary system by progressing the wound(s) through the phases of wound healing (inflammation - proliferation - remodeling) by: Wound Therapy Goals - Improve the function of patient's integumentary system by progressing the wound(s) through the phases of wound healing by: Decrease Necrotic Tissue to: 20% Decrease Necrotic Tissue - Progress: Progressing toward goal Increase Granulation Tissue to: 80% Increase  Granulation Tissue - Progress: Progressing toward goal Goals/treatment plan/discharge plan were made with and agreed upon by patient/family: No, Patient unable  to participate in goals/treatment/discharge plan and family unavailable Time For Goal Achievement: 7 days Wound Therapy - Potential for Goals: Good  Goals will be updated until maximal potential achieved or discharge criteria met.  Discharge criteria: when goals achieved, discharge from hospital, MD decision/surgical intervention, no progress towards goals, refusal/missing three consecutive treatments without notification or medical reason.  GP     Charges PT Wound Care Charges $Wound Debridement up to 20 cm: < or equal to 20 cm $PT Hydrotherapy Dressing: 1 dressing $PT PLS Gun and Tip: 1 Supply $PT Hydrotherapy Visit: 1 Visit       Thelma Comp 10/17/2020, 12:19 PM   Rolinda Roan, PT, DPT Acute Rehabilitation Services Pager: [phone] Office: [phone]

## 2020-10-17 NOTE — Progress Notes (Signed)
[place] Progress Note Patient Name: Raymond Stone DOB: 1951/03/14 MRN: [id number]   Date of Service  10/17/2020  HPI/Events of Note  Hypokalemia  Hypomagnesemia - K+ = 3.5, Mg++ = 1.4 and Creatinine = 2.0.  eICU Interventions  Will replace Mg+. Given recent episode of hyperkalemia and AKI will not replace K+.     Intervention Category Major Interventions: Electrolyte abnormality - evaluation and management  Jerry Clyne Eugene 10/17/2020, 5:12 AM

## 2020-10-17 NOTE — Progress Notes (Signed)
Pt transported to rm 6E13 via bed. Personal belongings at bedside. No acute events at this time. Received by [facility] staff.

## 2020-10-17 NOTE — Progress Notes (Signed)
[place] Progress Note Patient Name: Raymond Stone DOB: 01-06-1951 MRN: [id number]   Date of Service  10/17/2020  HPI/Events of Note  Called d/t elevated HR - Bedside monitor reveals sinus tachycardia with rate = 120-125. Low grade Temp = 98.8 F.  Albumin = 1.4. Patient was on Metoprolol at home. Etiology? Volume depletion vs fever vs B-Blocker withdrawal. Last EKG on 10/14/2020 revealed Sinus Tachycardia with HR = 109.  eICU Interventions  Plan: 1. 25% Albumin 12.5 gm IV X 1 now.      Intervention Category Major Interventions: Arrhythmia - evaluation and management  Jarrett Albor Eugene 10/17/2020, 2:54 AM

## 2020-10-17 NOTE — Progress Notes (Signed)
   10/17/20 0240  Assess: MEWS Score  ECG Heart Rate (!) 129  Assess: MEWS Score  MEWS Temp 0  MEWS Systolic 0  MEWS Pulse 2  MEWS RR 0  MEWS LOC 0  MEWS Score 2  MEWS Score Color Yellow  Notify: Provider  Provider Name/Title Critical care (elink)  Date Provider Notified 10/17/20  Time Provider Notified [phone]  Notification Type Call  Notification Reason Change in status  Provider response See new orders  Date of Provider Response 10/17/20  Time of Provider Response 0254  Document  Patient Outcome Stabilized after interventions  Progress note created (see row info) Yes

## 2020-10-17 NOTE — Progress Notes (Signed)
SLP Cancellation Note  Patient Details Name: Raymond Stone MRN: [id number] DOB: 12-22-1950   Cancelled treatment:        Attempted to see pt for ongoing dysphagia management.  Pt declined ST services as he had recently eaten.  Pt stated his stomach was full he could not try eating anything else at this time, but maybe he would later on.  RN reports no coughing or choking with PO intake, but did note that he had not eaten much.  SLP will reattempt as schedule permits, when pt is agreeable to treatment.   Celedonio Savage, MA, [place] Office: [phone]  10/17/2020, 10:11 AM

## 2020-10-18 DIAGNOSIS — N179 Acute kidney failure, unspecified: Secondary | ICD-10-CM | POA: Diagnosis not present

## 2020-10-18 DIAGNOSIS — G9341 Metabolic encephalopathy: Secondary | ICD-10-CM

## 2020-10-18 DIAGNOSIS — I10 Essential (primary) hypertension: Secondary | ICD-10-CM

## 2020-10-18 DIAGNOSIS — N170 Acute kidney failure with tubular necrosis: Secondary | ICD-10-CM

## 2020-10-18 DIAGNOSIS — F1020 Alcohol dependence, uncomplicated: Secondary | ICD-10-CM

## 2020-10-18 DIAGNOSIS — E870 Hyperosmolality and hypernatremia: Secondary | ICD-10-CM

## 2020-10-18 DIAGNOSIS — E875 Hyperkalemia: Secondary | ICD-10-CM

## 2020-10-18 DIAGNOSIS — R571 Hypovolemic shock: Secondary | ICD-10-CM | POA: Diagnosis not present

## 2020-10-18 LAB — BASIC METABOLIC PANEL
Anion gap: 12 (ref 5–15)
BUN: 26 mg/dL — ABNORMAL HIGH (ref 8–23)
CO2: 21 mmol/L — ABNORMAL LOW (ref 22–32)
Calcium: 9.3 mg/dL (ref 8.9–10.3)
Chloride: 110 mmol/L (ref 98–111)
Creatinine, Ser: 1.46 mg/dL — ABNORMAL HIGH (ref 0.61–1.24)
GFR, Estimated: 52 mL/min — ABNORMAL LOW (ref >60)
Glucose, Bld: 80 mg/dL (ref 70–99)
Potassium: 3.5 mmol/L (ref 3.5–5.1)
Sodium: 143 mmol/L (ref 135–145)

## 2020-10-18 LAB — VITAMIN C: Vitamin C: <0.1 mg/dL — ABNORMAL LOW (ref 0.4–2.0)

## 2020-10-18 LAB — GLUCOSE, CAPILLARY
Glucose-Capillary: 92 mg/dL (ref 70–99)
Glucose-Capillary: 93 mg/dL (ref 70–99)
Glucose-Capillary: 106 mg/dL — ABNORMAL HIGH (ref 70–99)
Glucose-Capillary: 104 mg/dL — ABNORMAL HIGH (ref 70–99)

## 2020-10-18 LAB — ZINC: Zinc: 60 ug/dL (ref 44–115)

## 2020-10-18 MED ORDER — THIAMINE HCL 100 MG PO TABS
250.0000 mg | ORAL_TABLET | Freq: Two times a day (BID) | ORAL | Status: DC
  Administered 2020-10-19: 250 mg via ORAL
  Filled 2020-10-18 (×4): qty 3

## 2020-10-18 NOTE — Progress Notes (Signed)
Physical Therapy Wound Treatment Patient Details  Name: Raymond Stone MRN: [id number] Date of Birth: Apr 11, 1951  Today's Date: 10/18/2020 Time: 7591-6384 Time Calculation (min): 37 min  Subjective  Subjective Assessment Subjective: Pt flat and lethargic with Patient and Family Stated Goals: None stated Date of Onset:  (Unsure) Prior Treatments: Dressing changes prior to hydro being consulted.  Pain Score:  Pt was not premedicated and did not complain of any pain throughout session.   Wound Assessment  Pressure Injury 10/13/20 Flank Left;Mid Unstageable - Full thickness tissue loss in which the base of the injury is covered by slough (yellow, tan, gray, green or brown) and/or eschar (tan, brown or black) in the wound bed. (Active)  Dressing Type Barrier Film (skin prep);Foam - Lift dressing to assess site every shift;Gauze (Comment);Moist to moist 10/18/20 1405  Dressing Changed;Clean;Dry;Intact 10/18/20 1405  Dressing Change Frequency Daily 10/18/20 1405  State of Healing Eschar 10/18/20 1405  Site / Wound Assessment Pink;Yellow;Brown 10/18/20 1405  % Wound base Red or Granulating 20% 10/18/20 1405  % Wound base Yellow/Fibrinous Exudate 70% 10/18/20 1405  % Wound base Black/Eschar 10% 10/18/20 1405  % Wound base Other/Granulation Tissue (Comment) 0% 10/18/20 1405  Peri-wound Assessment Intact;Black 10/18/20 1405  Wound Length (cm) 5.5 cm 10/15/20 1200  Wound Width (cm) 2 cm 10/15/20 1200  Wound Depth (cm) 0.1 cm 10/15/20 1200  Wound Surface Area (cm^2) 11 cm^2 10/15/20 1200  Wound Volume (cm^3) 1.1 cm^3 10/15/20 1200  Tunneling (cm) 0 10/15/20 1349  Undermining (cm) 0 10/15/20 1349  Margins Unattached edges (unapproximated) 10/18/20 1405  Drainage Amount Scant 10/18/20 1405  Drainage Description Serosanguineous 10/18/20 1405  Treatment Debridement (Selective);Hydrotherapy (Pulse lavage);Packing (Saline gauze) 10/18/20 1405      Hydrotherapy Pulsed lavage therapy - wound  location: L flank Pulsed Lavage with Suction (psi): 8 psi Pulsed Lavage with Suction - Normal Saline Used: 1000 mL Pulsed Lavage Tip: Tip with splash shield Selective Debridement Selective Debridement - Location: L flank Selective Debridement - Tools Used: Scalpel,Forceps Selective Debridement - Tissue Removed: black eschar and yellow slough    Wound Assessment and Plan  Wound Therapy - Assess/Plan/Recommendations Wound Therapy - Clinical Statement: Pt confused this session, calling out for his sister and asking me to go get her from the other room. Minimal unviable tissue able to be removed from wound as it is still adherent. Again requested air mattress and by end of session was present outside of room. Will decrease frequency to 3x/week to give slough a chance to soften between hydrotherapy sessions. This patient will benefit from continued hydrotherapy for selective removal of unviable tissue, to decrease bioburden, and promote wound bed healing. Wound Therapy - Functional Problem List: Global weakness in the setting of undetermined time down. Factors Delaying/Impairing Wound Healing: Infection - systemic/local,Immobility Hydrotherapy Plan: Debridement,Dressing change,Patient/family education,Pulsatile lavage with suction Wound Therapy - Frequency: 3X / week Wound Therapy - Current Recommendations: PT Wound Therapy - Follow Up Recommendations: dressing changes by RN  Wound Therapy Goals- Improve the function of patient's integumentary system by progressing the wound(s) through the phases of wound healing (inflammation - proliferation - remodeling) by: Wound Therapy Goals - Improve the function of patient's integumentary system by progressing the wound(s) through the phases of wound healing by: Decrease Necrotic Tissue to: 20% Decrease Necrotic Tissue - Progress: Progressing toward goal Increase Granulation Tissue to: 80% Increase Granulation Tissue - Progress: Progressing toward  goal Goals/treatment plan/discharge plan were made with and agreed upon by patient/family: No, Patient unable to  participate in goals/treatment/discharge plan and family unavailable Time For Goal Achievement: 7 days Wound Therapy - Potential for Goals: Good  Goals will be updated until maximal potential achieved or discharge criteria met.  Discharge criteria: when goals achieved, discharge from hospital, MD decision/surgical intervention, no progress towards goals, refusal/missing three consecutive treatments without notification or medical reason.  GP     Charges PT Wound Care Charges $Wound Debridement up to 20 cm: < or equal to 20 cm $PT Hydrotherapy Dressing: 1 dressing $PT PLS Gun and Tip: 1 Supply $PT Hydrotherapy Visit: 1 Visit       Thelma Comp 10/18/2020, 2:20 PM   Rolinda Roan, PT, DPT Acute Rehabilitation Services Pager: [phone] Office: [phone]

## 2020-10-18 NOTE — Evaluation (Signed)
Occupational Therapy Evaluation Patient Details Name: Zollie Ellery MRN: [id number] DOB: 04/29/1951 Today's Date: 10/18/2020    History of Present Illness Mr. Liwanag in a 70 yo man with hx of HTN, lives alone, found down after not being seen since Monday (5 days).   Clinical Impression   Pt admitted to the ED for the concerns listed above. Pt demonstrated confusion throughout the session, requiring orienting to place and situation. Per Pt he was independent living alone PTA, using no DME. At the time of the evaluation, pt is unable to move BUE or BLE. OT assisted with PROM for BUE, with no resistance or assistance from pt. Due to independent prior level of function and living alone, pt will need skilled OT services once d/c'd, and skilled acute OT services while in the hospital. Acute OT will continue to follow.     Follow Up Recommendations  SNF;Supervision/Assistance - 24 hour    Equipment Recommendations  Other (comment) (TBD as Pt progresses)    Recommendations for Other Services       Precautions / Restrictions Precautions Precautions: Fall;Other (comment) Precaution Comments: Watch HR and Wounds all over L side Restrictions Weight Bearing Restrictions: No      Mobility Bed Mobility Overal bed mobility: Needs Assistance Bed Mobility: Rolling;Supine to Sit Rolling: Total assist   Supine to sit: Total assist     General bed mobility comments: Pt is unable to engage core or power up through UE to assist with sitting in bed or rolling in bed.    Transfers Overall transfer level: Needs assistance Equipment used: None             General transfer comment: Pt was unable to be transferred at this time due to global weakness and confusion.    Balance Overall balance assessment: Needs assistance   Sitting balance-Leahy Scale: Zero Sitting balance - Comments: Pt unable to sit without support at this time Postural control: Posterior lean     Standing balance  comment: Pt unable to stand at this time due to global weakness                           ADL either performed or assessed with clinical judgement   ADL Overall ADL's : Needs assistance/impaired Eating/Feeding: Total assistance;Bed level Eating/Feeding Details (indicate cue type and reason): Pt unable to move UE to feed himself, requires a feeder at this time. Grooming: Dance movement psychotherapist;Total assistance;Bed level Grooming Details (indicate cue type and reason): Pt unable to bring hands to his face for grooming         Upper Body Dressing : Total assistance;Bed level Upper Body Dressing Details (indicate cue type and reason): Pt unable to move UE to assist with doffing and donning clean gown. Lower Body Dressing: Total assistance;Bed level Lower Body Dressing Details (indicate cue type and reason): Pt unable to sit up and use UE to reach and don socks             Functional mobility during ADLs: Total assistance;+2 for physical assistance;+2 for safety/equipment General ADL Comments: Pt is bed level at this time, unable to move UE or LE. In attempt to long sit in bed, pt could not maintain sitting posture, demonstrating a posterior lean back towards the bed.     Vision   Additional Comments: Pt did not keep his eyes open for most of the session. Vision will need to be reassessed during another session.  Perception Perception Perception Tested?: No   Praxis Praxis Praxis tested?: Not tested    Pertinent Vitals/Pain Pain Assessment: Faces Faces Pain Scale: Hurts even more Pain Location: On L side from wounds Pain Descriptors / Indicators: Grimacing;Restless Pain Intervention(s): Limited activity within patient's tolerance;Monitored during session;Repositioned;Other (comment) (Nursing notified of Pt Pain.)     Hand Dominance     Extremity/Trunk Assessment Upper Extremity Assessment Upper Extremity Assessment: RUE deficits/detail;LUE deficits/detail RUE  Deficits / Details: Pt is unable to activily move BUE, when instructed to squeeze OT fingers, pt's hands twitched in attempt to squeeze his hands closed. RUE Coordination: decreased fine motor;decreased gross motor LUE Deficits / Details: Pt is unable to activily move BUE, when instructed to squeeze OT fingers, pt's hands twitched in attempt to squeeze his hands closed. LUE Coordination: decreased fine motor;decreased gross motor   Lower Extremity Assessment Lower Extremity Assessment: Defer to PT evaluation       Communication Communication Communication: No difficulties   Cognition Arousal/Alertness: Lethargic Behavior During Therapy: Agitated Overall Cognitive Status: No family/caregiver present to determine baseline cognitive functioning Area of Impairment: Attention;Orientation;Memory;Following commands;Safety/judgement;Awareness                 Orientation Level: Disoriented to;Time;Place;Situation Current Attention Level: Selective Memory: Decreased short-term memory Following Commands: Follows one step commands with increased time;Follows one step commands inconsistently;Follows multi-step commands inconsistently Safety/Judgement: Decreased awareness of safety;Decreased awareness of deficits Awareness: Emergent   General Comments: Pt reported that he could walk and was going to leave here and go home, however pt was unable to move legs or arms when instructed. Pt reported that he did not know where he was, he thought he was in an apartment that was not his and people keep coming in to bother him.   General Comments  Pt has BUE and BLE edema, skin wounds on BLE and all over L side.    Exercises Exercises: General Upper Extremity General Exercises - Upper Extremity Shoulder Flexion: PROM;5 reps;Supine Shoulder ABduction: PROM;5 reps Shoulder ADduction: PROM;5 reps Shoulder Horizontal ABduction: PROM;5 reps Elbow Flexion: PROM;5 reps Elbow Extension: PROM;5 reps    Shoulder Instructions      Home Living Family/patient expects to be discharged to:: Private residence Living Arrangements: Alone Available Help at Discharge: Other (Comment) (Unknown) Type of Home: Apartment Home Access: Stairs to enter Entrance Stairs-Number of Steps: A flight Entrance Stairs-Rails: Right Home Layout: One level     Bathroom Shower/Tub: Teacher, early years/pre: Standard     Home Equipment: None   Additional Comments: All information was reported by pt, who demonstrated some confusion throughout session.      Prior Functioning/Environment Level of Independence: Independent        Comments: Per Pt report. complete prior level of function unknown at this time due to pt confusion.        OT Problem List: Decreased strength;Decreased range of motion;Decreased activity tolerance;Impaired balance (sitting and/or standing);Decreased coordination;Decreased cognition;Decreased safety awareness;Decreased knowledge of use of DME or AE;Impaired UE functional use      OT Treatment/Interventions: Self-care/ADL training;Therapeutic exercise;Energy conservation;DME and/or AE instruction;Therapeutic activities;Cognitive remediation/compensation;Patient/family education;Balance training    OT Goals(Current goals can be found in the care plan section) Acute Rehab OT Goals Patient Stated Goal: To go back home OT Goal Formulation: With patient Time For Goal Achievement: 11/01/20 Potential to Achieve Goals: Good ADL Goals Pt Will Perform Eating: with set-up;sitting;bed level Pt Will Perform Grooming: with min assist;sitting Pt Will Perform Upper Body  Dressing: with min assist;sitting Pt Will Transfer to Toilet: with mod assist;bedside commode;stand pivot transfer  OT Frequency: Min 2X/week   Barriers to D/C:            Co-evaluation              AM-PAC OT "6 Clicks" Daily Activity     Outcome Measure Help from another person eating meals?:  Total Help from another person taking care of personal grooming?: Total Help from another person toileting, which includes using toliet, bedpan, or urinal?: Total Help from another person bathing (including washing, rinsing, drying)?: Total Help from another person to put on and taking off regular upper body clothing?: Total Help from another person to put on and taking off regular lower body clothing?: Total 6 Click Score: 6   End of Session Nurse Communication: Mobility status;Other (comment) (Pt history and Pt pain level.)  Activity Tolerance: Patient limited by lethargy;Patient limited by pain;Other (comment) (Pt unable to sit or stand at this time.) Patient left: in bed;with call bell/phone within reach;with chair alarm set  OT Visit Diagnosis: Unsteadiness on feet (R26.81);Muscle weakness (generalized) (M62.81);History of falling (Z91.81)                Time: 1173-5670 OT Time Calculation (min): 17 min Charges:  OT General Charges $OT Visit: 1 Visit OT Evaluation $OT Eval Moderate Complexity: 1 Mod  Jissell Trafton H., OTR/L Acute Rehabilitation  Zevin Nevares Elane Yolanda Bonine 10/18/2020, 12:06 PM

## 2020-10-18 NOTE — Progress Notes (Signed)
PROGRESS NOTE    Raymond Stone  GNF:[id number] DOB: [birth year] DOA: 10/12/2020 PCP: [name], NP    Brief Narrative:  Raymond Stone was admitted to the hospital with working diagnosis hypovolemic shock complicated by acute kidney injury/ATN.   70 year old male past medical history of hypertension who was found down, apparently he was not on scene for about 5 days.  When EMS arrived he was severely hypotensive, his temperature was 100 F he received intravenous fluids and transported to the emergency room.  On his initial physical examination his blood pressure was 111/66, heart rate 109, temperature 37.8 C, respiratory rate 29, oxygen saturation 95% he was confused and disoriented, dry mucous membranes, lungs were clear to auscultation bilaterally, heart S1-S2, present, rhythmic, no gallops rubs or murmurs, his abdomen was soft, no lower extremity edema.  He was neurologically nonfocal.  Sodium 172, potassium 7.3, chloride >130, bicarb 15, glucose 156, BUN 211, creatinine 11.2, AST 118, ALT 42, total bilirubin is 1.5, troponin I 489-395, lactic acid 5.3. anion gap 49.  ABG, pH 7.37, PCO2 26, PO2 96, bicarb 15 White cell count 18.7, hemoglobin 11.1, hematocrit 37.6, platelets 346. SARS COVID-19 negative. Urinalysis specific gravity 1.026, negative protein, 21-50 white cells, 0-5 red cells. Toxicology negative. Head CT negative for acute changes.  Chest radiograph no infiltrates. EKG had 19 bpm, normal axis, normal intervals, sinus rhythm, no ST segment or T wave changes.  Patient was diagnosed with shock, he received intravenous fluids and broad-spectrum antibiotic therapy. Patient underwent CRRT 04/03 with further improvement of his kidney function.  Peak CK 2,415 mild rhabdomyolysis likely not the culprit of renal failure.  Transferred to [facility] 04/07.   Patient very debilitated, pending PT and OT evaluation, likely will need SNF for short term rehab.    Assessment &  Plan:   Principal Problem:   Hypovolemic shock (HCC) Active Problems:   Essential hypertension   AKI (acute kidney injury) ([place])   Alcohol dependence (HCC)   Pressure injury of skin   Malnutrition of moderate degree   Hypernatremia   Hyperkalemia   Acute metabolic encephalopathy   ATN (acute tubular necrosis) ([place])   1. Hypovolemic shock with AKI/ ATN, uremic encephalopathy, hyperkalemia, hypernatremia, anion gap metabolic acidosis. Hypomagnesemia.  Continue to be hemodynamically stable, his HR has been 97 to 108, occasionally up to 120's sinus tach, personally reviewed telemetry.    Continue to improve renal function with serum cr at 1,46 with K at 3,5 and serum bicarbonate at 21.  Continue to hold on IV fluids, encourage po intake.  Discontinue foley cathter.   2. HTN. Blood pressure controlled, off antihypertensive medications.  3. Etho abuse. Patient drinks alcohol at home, Continue with bid clonazepam and as needed lorazepam.   4. Moderate calorie protein malnutrition. Continue with nutritional supplements.  Thiamine 250 mg po bid.   5. Aspiration pneumonitis (present on admission). Ruled out for bacterial pneumonia. Ruled out sepsis.  Continue with aspiration precautions.   6. Multiple skin pressure wounds present on admission Upper and lower extremities. Local skin care, hydrotherapy, pending consult PT and OT.    Status is: Inpatient  Remains inpatient appropriate because:Ongoing diagnostic testing needed not appropriate for outpatient work up and IV treatments appropriate due to intensity of illness or inability to take PO   Dispo: The patient is from: Home              Anticipated d/c is to: SNF  Patient currently is not medically stable to d/c.   Difficult to place patient No  DVT prophylaxis: Enoxaparin   Code Status:   full  Family Communication:  No family at the bedside      Nutrition Status: Nutrition Problem: Moderate  Malnutrition Etiology: chronic illness (CHF) Signs/Symptoms: mild fat depletion,moderate muscle depletion Interventions: Ensure Enlive (each supplement provides 350kcal and 20 grams of protein),Magic cup,MVI     Skin Documentation: Pressure Injury 10/13/20 Thigh Anterior;Left;Proximal Unstageable - Full thickness tissue loss in which the base of the injury is covered by slough (yellow, tan, gray, green or brown) and/or eschar (tan, brown or black) in the wound bed. (Active)  10/13/20 0200  Location: Thigh  Location Orientation: Anterior;Left;Proximal  Staging: Unstageable - Full thickness tissue loss in which the base of the injury is covered by slough (yellow, tan, gray, green or brown) and/or eschar (tan, brown or black) in the wound bed.  Wound Description (Comments):   Present on Admission: Yes     Pressure Injury 10/13/20 Knee Anterior;Left Deep Tissue Pressure Injury - Purple or maroon localized area of discolored intact skin or blood-filled blister due to damage of underlying soft tissue from pressure and/or shear. (Active)  10/13/20 0200  Location: Knee  Location Orientation: Anterior;Left  Staging: Deep Tissue Pressure Injury - Purple or maroon localized area of discolored intact skin or blood-filled blister due to damage of underlying soft tissue from pressure and/or shear.  Wound Description (Comments):   Present on Admission: Yes     Pressure Injury 10/13/20 Flank Left;Upper Unstageable - Full thickness tissue loss in which the base of the injury is covered by slough (yellow, tan, gray, green or brown) and/or eschar (tan, brown or black) in the wound bed. (Active)  10/13/20 0200  Location: Flank  Location Orientation: Left;Upper  Staging: Unstageable - Full thickness tissue loss in which the base of the injury is covered by slough (yellow, tan, gray, green or brown) and/or eschar (tan, brown or black) in the wound bed.  Wound Description (Comments):   Present on Admission:  Yes     Pressure Injury 10/13/20 Toe (Comment  which one) Anterior;Right Deep Tissue Pressure Injury - Purple or maroon localized area of discolored intact skin or blood-filled blister due to damage of underlying soft tissue from pressure and/or shear. (Active)  10/13/20 0200  Location: Toe (Comment  which one)  Location Orientation: Anterior;Right  Staging: Deep Tissue Pressure Injury - Purple or maroon localized area of discolored intact skin or blood-filled blister due to damage of underlying soft tissue from pressure and/or shear.  Wound Description (Comments):   Present on Admission: Yes     Pressure Injury 10/13/20 Knee Anterior;Right Unstageable - Full thickness tissue loss in which the base of the injury is covered by slough (yellow, tan, gray, green or brown) and/or eschar (tan, brown or black) in the wound bed. (Active)  10/13/20 0800  Location: Knee  Location Orientation: Anterior;Right  Staging: Unstageable - Full thickness tissue loss in which the base of the injury is covered by slough (yellow, tan, gray, green or brown) and/or eschar (tan, brown or black) in the wound bed.  Wound Description (Comments):   Present on Admission: Yes     Pressure Injury 10/13/20 Ankle Posterior;Right Deep Tissue Pressure Injury - Purple or maroon localized area of discolored intact skin or blood-filled blister due to damage of underlying soft tissue from pressure and/or shear. (Active)  10/13/20 1900  Location: Ankle  Location Orientation: Posterior;Right  Staging: Deep Tissue Pressure Injury - Purple or maroon localized area of discolored intact skin or blood-filled blister due to damage of underlying soft tissue from pressure and/or shear.  Wound Description (Comments):   Present on Admission: Yes     Pressure Injury 10/13/20 Knee Left;Lateral Deep Tissue Pressure Injury - Purple or maroon localized area of discolored intact skin or blood-filled blister due to damage of underlying soft tissue  from pressure and/or shear. (Active)  10/13/20 1900  Location: Knee  Location Orientation: Left;Lateral  Staging: Deep Tissue Pressure Injury - Purple or maroon localized area of discolored intact skin or blood-filled blister due to damage of underlying soft tissue from pressure and/or shear.  Wound Description (Comments):   Present on Admission: Yes     Pressure Injury 10/13/20 Flank Left;Mid Unstageable - Full thickness tissue loss in which the base of the injury is covered by slough (yellow, tan, gray, green or brown) and/or eschar (tan, brown or black) in the wound bed. (Active)  10/13/20 1900  Location: Flank  Location Orientation: Left;Mid  Staging: Unstageable - Full thickness tissue loss in which the base of the injury is covered by slough (yellow, tan, gray, green or brown) and/or eschar (tan, brown or black) in the wound bed.  Wound Description (Comments):   Present on Admission: Yes     Pressure Injury 10/13/20 Elbow Left;Posterior Deep Tissue Pressure Injury - Purple or maroon localized area of discolored intact skin or blood-filled blister due to damage of underlying soft tissue from pressure and/or shear. (Active)  10/13/20 1900  Location: Elbow  Location Orientation: Left;Posterior  Staging: Deep Tissue Pressure Injury - Purple or maroon localized area of discolored intact skin or blood-filled blister due to damage of underlying soft tissue from pressure and/or shear.  Wound Description (Comments):   Present on Admission: Yes     Pressure Injury 10/13/20 Elbow Posterior;Right Deep Tissue Pressure Injury - Purple or maroon localized area of discolored intact skin or blood-filled blister due to damage of underlying soft tissue from pressure and/or shear. (Active)  10/13/20 1900  Location: Elbow  Location Orientation: Posterior;Right  Staging: Deep Tissue Pressure Injury - Purple or maroon localized area of discolored intact skin or blood-filled blister due to damage of  underlying soft tissue from pressure and/or shear.  Wound Description (Comments):   Present on Admission: Yes     Consultants:   Nephrology  Procedures:   HD catheter    Subjective: Patient is feeling better, but not yet back to baseline, continue to be very weak and deconditioned, denies any chest pain or dyspnea, no tremors or anxiety   Objective: Vitals:   10/17/20 2011 10/18/20 0011 10/18/20 0546 10/18/20 0822  BP: 138/70 128/70 139/79 119/75  Pulse: (!) 101 97 (!) 108   Resp: 15 20 16 19   Temp: 98.7 F (37.1 C)  97.8 F (36.6 C) 98.8 F (37.1 C)  TempSrc: Oral Oral Oral Oral  SpO2: 99% 99% 99%   Weight:   79.4 kg   Height:       No intake or output data in the 24 hours ending 10/18/20 1032 Filed Weights   10/15/20 0500 10/16/20 0500 10/18/20 0546  Weight: 78 kg 78.1 kg 79.4 kg    Examination:   General: Not in pain or dyspnea, deconditioned  Neurology: Awake and alert, non focal  E ENT: mild pallor, no icterus, oral mucosa moist Cardiovascular: No JVD. S1-S2 present, rhythmic, no gallops, rubs, or murmurs. Trace non pitting 4 extremity  edema,   Pulmonary: vesicular breath sounds bilaterally, adequate air movement, no wheezing, rhonchi or rales. Gastrointestinal. Abdomen soft and non tender Skin. No rashes Musculoskeletal: no joint deformities     Data Reviewed: I have personally reviewed following labs and imaging studies  CBC: Recent Labs  Lab 10/12/20 1740 10/12/20 2136 10/13/20 0256 10/14/20 0210 10/15/20 0322 10/16/20 0334 10/17/20 0336  WBC 18.4*   < > 19.5* 13.5* 18.4* 21.3* 17.7*  NEUTROABS 14.8*  --  15.7*  --   --   --   --   HGB 15.0   < > 12.1* 10.6* 9.3* 9.3* 9.3*  HCT 49.9   < > 39.0 34.3* 29.9* 29.8* 28.5*  MCV 111.6*   < > 109.2* 109.9* 107.2* 108.8* 102.2*  PLT 523*   < > 320 289 244 250 264   < > = values in this interval not displayed.   Basic Metabolic Panel: Recent Labs  Lab 10/13/20 0256 10/13/20 0535  10/14/20 0210 10/14/20 0904 10/15/20 0322 10/15/20 1452 10/16/20 0334 10/16/20 1422 10/17/20 0336 10/18/20 0607  NA  --    < > 156*   < > 155* 154* 153* 144 141 143  K  --    < > 4.9   < > 4.2 4.0 3.8 3.8 3.5 3.5  CL  --    < > 123*   < > 124* 123* 121* 114* 110 110  CO2  --    < > 21*   < > 23 23 25 22 23  21*  GLUCOSE  --    < > 111*   < > 116* 199* 140* 135* 123* 80  BUN  --    < > 129*   < > 95* 84* 67* 54* 43* 26*  CREATININE  --    < > 5.96*   < > 4.60* 3.88* 3.04* 2.40* 2.00* 1.46*  CALCIUM  --    < > 8.3*   < > 8.5* 8.9 8.9 8.6* 8.7* 9.3  MG 3.9*  --  2.7*  --  2.2  --  1.8  --  1.4*  --   PHOS  --    < > 4.1   < > 3.8  3.8 3.8 3.5 2.9 2.4*  --    < > = values in this interval not displayed.   GFR: Estimated Creatinine Clearance: 47.8 mL/min (A) (by C-G formula based on SCr of 1.46 mg/dL (H)). Liver Function Tests: Recent Labs  Lab 10/12/20 1854 10/13/20 0719 10/13/20 1559 10/14/20 1435 10/15/20 0322 10/15/20 1452 10/16/20 0334 10/16/20 1422  AST 118* 101*  --   --   --   --   --   --   ALT 42 33  --   --   --   --   --   --   ALKPHOS 61 43  --   --   --   --   --   --   BILITOT 1.5* 1.1  --   --   --   --   --   --   PROT 9.1* 6.3*  --   --   --   --   --   --   ALBUMIN 2.2* 1.6*   < > 1.4* 1.4* 1.5* 1.4* 1.4*   < > = values in this interval not displayed.   No results for input(s): LIPASE, AMYLASE in the last 168 hours. Recent Labs  Lab 10/12/20 1826  AMMONIA 46*  Coagulation Profile: Recent Labs  Lab 10/12/20 1740  INR 1.6*   Cardiac Enzymes: Recent Labs  Lab 10/12/20 1826 10/13/20 0256 10/15/20 0322 10/17/20 0336  CKTOTAL 2,442* 2,415* 1,430* 611*   BNP (last 3 results) No results for input(s): PROBNP in the last 8760 hours. HbA1C: No results for input(s): HGBA1C in the last 72 hours. CBG: Recent Labs  Lab 10/17/20 0538 10/17/20 1203 10/17/20 1735 10/17/20 2145 10/18/20 0824  GLUCAP 112* 112* 95 107* 92   Lipid Profile: No  results for input(s): CHOL, HDL, LDLCALC, TRIG, CHOLHDL, LDLDIRECT in the last 72 hours. Thyroid Function Tests: No results for input(s): TSH, T4TOTAL, FREET4, T3FREE, THYROIDAB in the last 72 hours. Anemia Panel: No results for input(s): VITAMINB12, FOLATE, FERRITIN, TIBC, IRON, RETICCTPCT in the last 72 hours.    Radiology Studies: I have reviewed all of the imaging during this hospital visit personally     Scheduled Meds: . chlorhexidine  15 mL Mouth Rinse BID  . Chlorhexidine Gluconate Cloth  6 each Topical Q0600  . clonazePAM  0.5 mg Oral BID  . collagenase   Topical Daily  . enoxaparin (LOVENOX) injection  40 mg Subcutaneous Q24H  . feeding supplement  237 mL Oral TID WC & HS  . mouth rinse  15 mL Mouth Rinse q12n4p  . multivitamin with minerals  1 tablet Oral Daily  . sodium chloride flush  10-40 mL Intracatheter Q12H  . thiamine  100 mg Oral Daily   Continuous Infusions: . sodium chloride Stopped (10/14/20 0859)     LOS: 6 days        Justine Dines Gerome Apley, MD

## 2020-10-18 NOTE — Progress Notes (Signed)
  Speech Language Pathology Treatment: Dysphagia  Patient Details Name: Tyshan Enderle MRN: [id number] DOB: 1951/06/06 Today's Date: 10/18/2020 Time: 5462-7035 SLP Time Calculation (min) (ACUTE ONLY): 14 min  Assessment / Plan / Recommendation Clinical Impression  Pt was agreeable to PO trials from his breakfast tray after being awakened by the SLP. No overt s/s of aspiration were noted even when he took large consecutive sips of water. Pt does demonstrate prolonged mastication and bolus formation with solids but his oral phase still appears functional. SLP intermittently provided liquid washes following solids. Pt is currently afebrile and his breath sounds have been clear and diminished. He also reports no difficulty with recent meals following diet upgrade to regular solids. Recommend continuation of his current diet and SLP will sign off.    HPI HPI: 70 yo man with hx of HTN, lives alone, found down after not being seen for five days and admitted 4/2. Dx acute metabolic encephalopathy, acute kidney injury, hypovolemic shock, rhabdomylosis.      SLP Plan  All goals met       Recommendations  Diet recommendations: Regular;Thin liquid Liquids provided via: Cup;Straw Medication Administration: Whole meds with liquid Supervision: Full supervision/cueing for compensatory strategies;Staff to assist with self feeding Compensations: Slow rate;Small sips/bites Postural Changes and/or Swallow Maneuvers: Seated upright 90 degrees                Oral Care Recommendations: Oral care BID Follow up Recommendations: None SLP Visit Diagnosis: Dysphagia, unspecified (R13.10) Plan: All goals met       GO                Jeanine Luz., SLP Student 10/18/2020, 10:33 AM

## 2020-10-18 NOTE — Evaluation (Signed)
Physical Therapy Evaluation Patient Details Name: Raymond Stone MRN: [id number] DOB: 02/06/1951 Today's Date: 10/18/2020   History of Present Illness  Pt is a 70 y/o male who presents after being found down for an undetermined length of time at home. He was admitted 10/12/20 with several pressure injuries, hypovolemic shock complicated by acute kidney injury. PMH significant for HTN, alcohol dependence, malnutrition.    Clinical Impression  Pt admitted with above diagnosis. Pt currently with functional limitations due to the deficits listed below (see PT Problem List). At the time of PT eval pt was able to perform bed mobility only with +2 total assist for all aspects of attempts. Pt with minimal participation in general strength testing from supine and kept eyes closed for most of the session. Pt starting to become agitated by end of session and did not push further mobility. As pt was previously living independently with reported occasional use of SPC, feel he would be a good candidate for SNF level rehab at d/c. Acutely, pt will benefit from skilled PT to increase their independence and safety with mobility to allow discharge to the venue listed below.       Follow Up Recommendations SNF;Supervision/Assistance - 24 hour    Equipment Recommendations  Other (comment) (TBD by next venue of care)    Recommendations for Other Services       Precautions / Restrictions Precautions Precautions: Fall Precaution Comments: Watch HR, multiple wounds Restrictions Weight Bearing Restrictions: No      Mobility  Bed Mobility Overal bed mobility: Needs Assistance Bed Mobility: Rolling Rolling: Total assist;+2 for physical assistance   Supine to sit: Total assist     General bed mobility comments: +2 total assist required for all aspects of bed mobility including repositioning and scooting up in bed.    Transfers Overall transfer level: Needs assistance Equipment used: None              General transfer comment: Pt was unable to be transferred at this time due to global weakness and confusion.  Ambulation/Gait                Stairs            Wheelchair Mobility    Modified Rankin (Stroke Patients Only)       Balance Overall balance assessment: Needs assistance   Sitting balance-Leahy Scale: Zero Sitting balance - Comments: Unable to assess Postural control: Posterior lean     Standing balance comment: Unable to assess                             Pertinent Vitals/Pain Pain Assessment: Faces Faces Pain Scale: Hurts even more Pain Location: Multiple wound sites Pain Descriptors / Indicators: Grimacing;Tender;Sore Pain Intervention(s): Limited activity within patient's tolerance;Monitored during session;Repositioned    Home Living Family/patient expects to be discharged to:: Private residence Living Arrangements: Alone Available Help at Discharge: Other (Comment) (Unknown - pt reports none, he wants his privacy) Type of Home: Apartment Home Access: Stairs to enter Entrance Stairs-Rails: Right Entrance Stairs-Number of Steps: A flight Home Layout: One level Home Equipment: Cane - single point Additional Comments: Home living forwarded from OT evaluation. Pt reports no stairs to enter his apartment and walk-in shower. Will need to confirm home set up as his answers are differing between therapies.    Prior Function Level of Independence: Independent with assistive device(s)         Comments: Pt  reports he occasionally used a SPC. Again, a differing report from what he told OT earlier today. He states he took care of his own groceries and drove occasionally. Unable to tell me any details around his fall.     Hand Dominance        Extremity/Trunk Assessment   Upper Extremity Assessment Upper Extremity Assessment: RUE deficits/detail;LUE deficits/detail RUE Deficits / Details: Grossly pt is able to demonstrate 2/5  strength in the LUE and 1/5 strength in the RUE. Pain reported bilaterally with any movement of UE's. RUE Coordination: decreased fine motor;decreased gross motor LUE Deficits / Details: Pt is unable to activily move BUE, when instructed to squeeze OT fingers, pt's hands twitched in attempt to squeeze his hands closed. LUE Coordination: decreased fine motor;decreased gross motor    Lower Extremity Assessment Lower Extremity Assessment: RLE deficits/detail;LLE deficits/detail RLE Deficits / Details: Significant weakness bilaterally. Pt able to demonstrate 1/5 strength in quads/hip flexors. Unable to test hamstrings. PROM in knees/hips appear painful however pt unable to tell me any details of what he was feeling during testing.    Cervical / Trunk Assessment Cervical / Trunk Assessment:  (Unable to assess)  Communication   Communication: Other (comment) (Hoarse voice with difficulty understanding him at times.)  Cognition Arousal/Alertness: Lethargic Behavior During Therapy: Agitated Overall Cognitive Status: No family/caregiver present to determine baseline cognitive functioning Area of Impairment: Memory;Orientation;Following commands;Safety/judgement                 Orientation Level: Disoriented to;Time;Place;Situation Current Attention Level: Selective Memory: Decreased short-term memory Following Commands: Follows one step commands with increased time;Follows one step commands inconsistently Safety/Judgement: Decreased awareness of safety;Decreased awareness of deficits Awareness: Intellectual   General Comments: Pt reported that he could walk and was going to leave here and go home, however pt was unable to move legs or arms when instructed. Pt reported that he did not know where he was, he thought he was in an apartment that was not his and people keep coming in to bother him.      General Comments General comments (skin integrity, edema, etc.): Pt has BUE and BLE edema,  skin wounds on BLE and all over L side.    Exercises General Exercises - Upper Extremity Shoulder Flexion: PROM;5 reps;Supine Shoulder ABduction: PROM;5 reps Shoulder ADduction: PROM;5 reps Shoulder Horizontal ABduction: PROM;5 reps Elbow Flexion: PROM;5 reps Elbow Extension: PROM;5 reps   Assessment/Plan    PT Assessment Patient needs continued PT services  PT Problem List Decreased strength;Decreased range of motion;Decreased activity tolerance;Decreased balance;Decreased mobility;Decreased cognition;Decreased knowledge of use of DME;Decreased knowledge of precautions;Decreased safety awareness;Cardiopulmonary status limiting activity;Decreased skin integrity;Pain       PT Treatment Interventions DME instruction    PT Goals (Current goals can be found in the Care Plan section)  Acute Rehab PT Goals Patient Stated Goal: To go back home PT Goal Formulation: With patient Time For Goal Achievement: 11/01/20 Potential to Achieve Goals: Fair    Frequency Min 2X/week   Barriers to discharge Decreased caregiver support;Inaccessible home environment      Co-evaluation               AM-PAC PT "6 Clicks" Mobility  Outcome Measure Help needed turning from your back to your side while in a flat bed without using bedrails?: Total Help needed moving from lying on your back to sitting on the side of a flat bed without using bedrails?: Total Help needed moving to and from a bed  to a chair (including a wheelchair)?: Total Help needed standing up from a chair using your arms (e.g., wheelchair or bedside chair)?: Total Help needed to walk in hospital room?: Total Help needed climbing 3-5 steps with a railing? : Total 6 Click Score: 6    End of Session   Activity Tolerance: Patient limited by lethargy;Treatment limited secondary to agitation (Grossly lethargic but getting agitated the more therapist assessed him.) Patient left: in bed;with call bell/phone within reach Nurse  Communication: Mobility status PT Visit Diagnosis: History of falling (Z91.81);Muscle weakness (generalized) (M62.81);Adult, failure to thrive (R62.7)    Time: 1230-1245 PT Time Calculation (min) (ACUTE ONLY): 15 min   Charges:   PT Evaluation $PT Eval Moderate Complexity: 1 Mod          Rolinda Roan, PT, DPT Acute Rehabilitation Services Pager: [phone] Office: [phone]   Thelma Comp 10/18/2020, 2:52 PM

## 2020-10-18 NOTE — TOC Initial Note (Signed)
Transition of Care [facility]) - Initial/Assessment Note    Patient Details  Name: Raymond Stone MRN: [id number] Date of Birth: 1951-05-11  Transition of [facility]) CM/SW Contact:    Trula Ore, [place] Phone Number: 10/18/2020, 4:00 PM  Clinical Narrative:                  CSW received consult for possible SNF placement at time of discharge. CSW spoke with patients daughter [name] regarding PT recommendation of SNF placement at time of discharge.  Patients daughter expressed understanding of PT recommendation and is agreeable to SNF placement at time of discharge. Patients daughter gave CSW permission to fax out inital referral near [place] area. Patient has received the COVID vaccines.No further questions reported at this time. CSW to continue to follow and assist with discharge planning needs.   Expected Discharge Plan: Skilled Nursing Facility Barriers to Discharge: Continued Medical Work up   Patient Goals and CMS Choice   CMS Medicare.gov Compare Post Acute Care list provided to:: Patient Represenative (must comment) [name] daughter) Choice offered to / list presented to : Adult Children [name])  Expected Discharge Plan and Services Expected Discharge Plan: [place] In-house Referral: Clinical Social Work     Living arrangements for the past 2 months: Single Family Home                                      Prior Living Arrangements/Services Living arrangements for the past 2 months: Single Family Home Lives with:: Self Patient language and need for interpreter reviewed:: Yes Do you feel safe going back to the place where you live?: No   SNF  Need for Family Participation in Patient Care: Yes (Comment) Care giver support system in place?: Yes (comment)   Criminal Activity/Legal Involvement Pertinent to Current Situation/Hospitalization: No - Comment as needed  Activities of Daily Living Home Assistive Devices/Equipment: None ADL Screening  (condition at time of admission) Patient's cognitive ability adequate to safely complete daily activities?: No Is the patient deaf or have difficulty hearing?: Yes Does the patient have difficulty seeing, even when wearing glasses/contacts?: Yes Does the patient have difficulty concentrating, remembering, or making decisions?: Yes Patient able to express need for assistance with ADLs?: No Does the patient have difficulty dressing or bathing?: Yes Independently performs ADLs?: No Does the patient have difficulty walking or climbing stairs?: Yes Weakness of Legs: Both Weakness of Arms/Hands: Both  Permission Sought/Granted Permission sought to share information with : Case Manager,Family Chief Financial Officer       Permission granted to share info w AGENCY: SNF        Emotional Assessment       Orientation: :  (Dioriented x 4) Alcohol / Substance Use: Not Applicable Psych Involvement: No (comment)  Admission diagnosis:  Dehydration [E86.0] NSTEMI (non-ST elevated myocardial infarction) ([place]) [I21.4] AKI (acute kidney injury) ([place]) [N17.9] Non-traumatic rhabdomyolysis [M62.82] Sepsis with acute renal failure and septic shock, due to unspecified organism, unspecified acute renal failure type ([place]) [A41.9, R65.21, N17.9] Patient Active Problem List   Diagnosis Date Noted  . Hypernatremia 10/18/2020  . Hyperkalemia 10/18/2020  . Acute metabolic encephalopathy 69/62/9528  . Hypovolemic shock ([place]) 10/18/2020  . ATN (acute tubular necrosis) ([place]) 10/18/2020  . Malnutrition of moderate degree 10/17/2020  . Pressure injury of skin 10/13/2020  . Acute respiratory failure with hypoxia ([place]) 11/24/2018  . Angioedema 05/22/2017  .  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 Endotracheally intubated   . History of alcohol dependence ([place]) 05/17/2017  . Leukocytosis 01/27/2017  . AKI (acute kidney injury) ([place]) 01/24/2017  .  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 Alcohol dependence ([place]) 01/24/2017  . Normocytic anemia 01/24/2017  . Thrombocytosis 01/24/2017  . Chronic diastolic CHF (congestive heart failure) ([place]) 01/24/2017  . Scrotal edema 10/20/2016  . Nephrotic syndrome 10/20/2016  . Essential hypertension 01/07/2016  . Severe eczema 04/16/2014   PCP:  [name], NP Pharmacy:   [facility] [place], [address] [place] [address] Phone: [phone] Fax: [phone]     Social Determinants of Health (SDOH) Interventions    Readmission Risk Interventions No flowsheet data found.

## 2020-10-18 NOTE — Progress Notes (Signed)
Foley catheter removed. Patient tolerated well.

## 2020-10-19 DIAGNOSIS — N179 Acute kidney failure, unspecified: Secondary | ICD-10-CM | POA: Diagnosis not present

## 2020-10-19 DIAGNOSIS — G9341 Metabolic encephalopathy: Secondary | ICD-10-CM | POA: Diagnosis not present

## 2020-10-19 DIAGNOSIS — R571 Hypovolemic shock: Secondary | ICD-10-CM | POA: Diagnosis not present

## 2020-10-19 DIAGNOSIS — F1020 Alcohol dependence, uncomplicated: Secondary | ICD-10-CM | POA: Diagnosis not present

## 2020-10-19 LAB — GLUCOSE, CAPILLARY
Glucose-Capillary: 107 mg/dL — ABNORMAL HIGH (ref 70–99)
Glucose-Capillary: 102 mg/dL — ABNORMAL HIGH (ref 70–99)
Glucose-Capillary: 99 mg/dL (ref 70–99)

## 2020-10-19 MED ORDER — METOPROLOL TARTRATE 25 MG PO TABS
25.0000 mg | ORAL_TABLET | Freq: Two times a day (BID) | ORAL | Status: DC
  Administered 2020-10-19 – 2020-10-25 (×8): 25 mg via ORAL
  Filled 2020-10-19 (×11): qty 1

## 2020-10-19 MED ORDER — LORAZEPAM 2 MG/ML IJ SOLN
1.0000 mg | Freq: Once | INTRAMUSCULAR | Status: AC
  Administered 2020-10-19: 1 mg via INTRAVENOUS
  Filled 2020-10-19: qty 1

## 2020-10-19 NOTE — Plan of Care (Signed)

## 2020-10-19 NOTE — Progress Notes (Signed)
PROGRESS NOTE    Luismario Coston  RAQ:[id number] DOB: 04-05-1951 DOA: 10/12/2020 PCP: [name], NP    Brief Narrative:  Mr. Arquette admitted to the hospital with working diagnosis hypovolemic shock complicated by acute kidney injury/ATN.  70 year old male past medical history of hypertension who was found down, apparently he was not on scene for about 5 days. When EMS arrived he was severely hypotensive, his temperature was 100 F he received intravenous fluids and transported to the emergency room. On his initial physical examination his blood pressure was 111/66, heart rate 109, temperature 37.8 C, respiratory rate 29, oxygen saturation 95% he was confused and disoriented, dry mucous membranes, lungs were clear to auscultation bilaterally, heart S1-S2, present, rhythmic, no gallops rubs or murmurs, his abdomen was soft, no lower extremity edema. He was neurologically nonfocal.  Sodium 172, potassium 7.3, chloride>130,bicarb 15, glucose 156, BUN 211, creatinine 11.2, AST 118, ALT 42, total bilirubin is 1.5, troponin I 489-395, lactic acid 5.3. anion gap 49. ABG, pH 7.37, PCO2 26, PO2 96, bicarb 15 White cell count 18.7, hemoglobin 11.1, hematocrit 37.6, platelets 346. SARS COVID-19 negative. Urinalysis specific gravity 1.026, negative protein, 21-50 white cells, 0-5 red cells. Toxicology negative. Head CT negative for acute changes.  Chest radiograph no infiltrates. EKG had 19 bpm, normal axis, normal intervals, sinus rhythm, no ST segment or T wave changes.  Patient was diagnosed with shock, he received intravenous fluids and broad-spectrum antibiotic therapy. Patient underwent CRRT04/03with further improvement of his kidney function.  Peak CK 2,415 mild rhabdomyolysis likely not the culprit of renal failure.  Transferred to [facility]04/07.  Patient very debilitated, pending PT and OT evaluation, likely will need SNF for short term rehab.    Assessment &  Plan:   Principal Problem:   Hypovolemic shock (HCC) Active Problems:   Essential hypertension   AKI (acute kidney injury) ([place])   Alcohol dependence (HCC)   Pressure injury of skin   Malnutrition of moderate degree   Hypernatremia   Hyperkalemia   Acute metabolic encephalopathy   ATN (acute tubular necrosis) ([place])   1. Hypovolemic shock with AKI/ ATN, uremic encephalopathy, hyperkalemia, hypernatremia, anion gap metabolic acidosis.Hypomagnesemia.  Blood pressure 153/82, continue to be sinus tachycardia 114 to 120 bpm, personally reviewed telemetry.   Foley catheter has been removed, will check renal function again in am.  2. HTN. will start patient with metoprolol for blood pressure control, also will help in heart rate control. Continue with telemetry monitoring.   3. Etho abuse/ metabolic encephalopathy . Patient drinks alcohol at home, Tolerating well bid clonazepam, and continue with as needed lorazepam.  Today patient disorientated to place and situation but not agitated, no tremors.   4.Moderatecalorie protein malnutrition. On nutritional supplements.  Including multivitamins and Thiamine 250 mg po bid.   5. Aspiration pneumonitis (present on admission).Ruled out for bacterial pneumonia. Ruled out sepsis. Aspiration precautions.   6. Multiple skin pressure wounds present on admission Upper and lower extremities.continue with local skin care, tolerating well hydrotherapy. Patient will need SNF    Status is: Inpatient  Remains inpatient appropriate because:Inpatient level of care appropriate due to severity of illness   Dispo: The patient is from: Home              Anticipated d/c is to: SNF              Patient currently is not medically stable to d/c.   Difficult to place patient No   DVT prophylaxis: Enoxaparin  Code Status:   full  Family Communication:  No family at the bedside      Nutrition Status: Nutrition Problem: Moderate  Malnutrition Etiology: chronic illness (CHF) Signs/Symptoms: mild fat depletion,moderate muscle depletion Interventions: Ensure Enlive (each supplement provides 350kcal and 20 grams of protein),Magic cup,MVI     Skin Documentation: Pressure Injury 10/13/20 Thigh Anterior;Left;Proximal Unstageable - Full thickness tissue loss in which the base of the injury is covered by slough (yellow, tan, gray, green or brown) and/or eschar (tan, brown or black) in the wound bed. (Active)  10/13/20 0200  Location: Thigh  Location Orientation: Anterior;Left;Proximal  Staging: Unstageable - Full thickness tissue loss in which the base of the injury is covered by slough (yellow, tan, gray, green or brown) and/or eschar (tan, brown or black) in the wound bed.  Wound Description (Comments):   Present on Admission: Yes     Pressure Injury 10/13/20 Knee Anterior;Left Deep Tissue Pressure Injury - Purple or maroon localized area of discolored intact skin or blood-filled blister due to damage of underlying soft tissue from pressure and/or shear. (Active)  10/13/20 0200  Location: Knee  Location Orientation: Anterior;Left  Staging: Deep Tissue Pressure Injury - Purple or maroon localized area of discolored intact skin or blood-filled blister due to damage of underlying soft tissue from pressure and/or shear.  Wound Description (Comments):   Present on Admission: Yes     Pressure Injury 10/13/20 Flank Left;Upper Unstageable - Full thickness tissue loss in which the base of the injury is covered by slough (yellow, tan, gray, green or brown) and/or eschar (tan, brown or black) in the wound bed. (Active)  10/13/20 0200  Location: Flank  Location Orientation: Left;Upper  Staging: Unstageable - Full thickness tissue loss in which the base of the injury is covered by slough (yellow, tan, gray, green or brown) and/or eschar (tan, brown or black) in the wound bed.  Wound Description (Comments):   Present on Admission:  Yes     Pressure Injury 10/13/20 Toe (Comment  which one) Anterior;Right Deep Tissue Pressure Injury - Purple or maroon localized area of discolored intact skin or blood-filled blister due to damage of underlying soft tissue from pressure and/or shear. (Active)  10/13/20 0200  Location: Toe (Comment  which one)  Location Orientation: Anterior;Right  Staging: Deep Tissue Pressure Injury - Purple or maroon localized area of discolored intact skin or blood-filled blister due to damage of underlying soft tissue from pressure and/or shear.  Wound Description (Comments):   Present on Admission: Yes     Pressure Injury 10/13/20 Knee Anterior;Right Unstageable - Full thickness tissue loss in which the base of the injury is covered by slough (yellow, tan, gray, green or brown) and/or eschar (tan, brown or black) in the wound bed. (Active)  10/13/20 0800  Location: Knee  Location Orientation: Anterior;Right  Staging: Unstageable - Full thickness tissue loss in which the base of the injury is covered by slough (yellow, tan, gray, green or brown) and/or eschar (tan, brown or black) in the wound bed.  Wound Description (Comments):   Present on Admission: Yes     Pressure Injury 10/13/20 Ankle Posterior;Right Deep Tissue Pressure Injury - Purple or maroon localized area of discolored intact skin or blood-filled blister due to damage of underlying soft tissue from pressure and/or shear. (Active)  10/13/20 1900  Location: Ankle  Location Orientation: Posterior;Right  Staging: Deep Tissue Pressure Injury - Purple or maroon localized area of discolored intact skin or blood-filled blister due to damage  of underlying soft tissue from pressure and/or shear.  Wound Description (Comments):   Present on Admission: Yes     Pressure Injury 10/13/20 Knee Left;Lateral Deep Tissue Pressure Injury - Purple or maroon localized area of discolored intact skin or blood-filled blister due to damage of underlying soft tissue  from pressure and/or shear. (Active)  10/13/20 1900  Location: Knee  Location Orientation: Left;Lateral  Staging: Deep Tissue Pressure Injury - Purple or maroon localized area of discolored intact skin or blood-filled blister due to damage of underlying soft tissue from pressure and/or shear.  Wound Description (Comments):   Present on Admission: Yes     Pressure Injury 10/13/20 Flank Left;Mid Unstageable - Full thickness tissue loss in which the base of the injury is covered by slough (yellow, tan, gray, green or brown) and/or eschar (tan, brown or black) in the wound bed. (Active)  10/13/20 1900  Location: Flank  Location Orientation: Left;Mid  Staging: Unstageable - Full thickness tissue loss in which the base of the injury is covered by slough (yellow, tan, gray, green or brown) and/or eschar (tan, brown or black) in the wound bed.  Wound Description (Comments):   Present on Admission: Yes     Pressure Injury 10/13/20 Elbow Left;Posterior Deep Tissue Pressure Injury - Purple or maroon localized area of discolored intact skin or blood-filled blister due to damage of underlying soft tissue from pressure and/or shear. (Active)  10/13/20 1900  Location: Elbow  Location Orientation: Left;Posterior  Staging: Deep Tissue Pressure Injury - Purple or maroon localized area of discolored intact skin or blood-filled blister due to damage of underlying soft tissue from pressure and/or shear.  Wound Description (Comments):   Present on Admission: Yes     Pressure Injury 10/13/20 Elbow Posterior;Right Deep Tissue Pressure Injury - Purple or maroon localized area of discolored intact skin or blood-filled blister due to damage of underlying soft tissue from pressure and/or shear. (Active)  10/13/20 1900  Location: Elbow  Location Orientation: Posterior;Right  Staging: Deep Tissue Pressure Injury - Purple or maroon localized area of discolored intact skin or blood-filled blister due to damage of  underlying soft tissue from pressure and/or shear.  Wound Description (Comments):   Present on Admission: Yes     Subjective: Patient is awake and alert, no nausea vomiting, no chest pain or dyspnea. No anxiety or tremors, continue to be very weak and deconditioned, not back to his baseline. Today he was confused in terms of situation and place.   Objective: Vitals:   10/18/20 1638 10/18/20 1700 10/18/20 2026 10/19/20 0525  BP: (!) 115/53  (!) 116/59 (!) 156/89  Pulse: (!) 135  (!) 105 (!) 115  Resp:   16 16  Temp: 99.3 F (37.4 C) 98.8 F (37.1 C) 98.2 F (36.8 C) 98.2 F (36.8 C)  TempSrc: Oral  Oral Oral  SpO2: 99%  97% 98%  Weight:    76.2 kg  Height:        Intake/Output Summary (Last 24 hours) at 10/19/2020 0934 Last data filed at 10/18/2020 1800 Gross per 24 hour  Intake --  Output 290 ml  Net -290 ml   Filed Weights   10/16/20 0500 10/18/20 0546 10/19/20 0525  Weight: 78.1 kg 79.4 kg 76.2 kg    Examination:   General: Not in pain or dyspnea.  Neurology: Awake and alert, non focal, disorientated to place and situation, no agitation and easy to be redirected.  E ENT: mild pallor, no icterus, oral mucosa moist  Cardiovascular: No JVD. S1-S2 present, rhythmic, no gallops, rubs, or murmurs. No lower extremity edema. Pulmonary: positive breath sounds bilaterally,  Gastrointestinal. Abdomen soft and non tender Skin. Multiple skin pressure ulcers upper and lower extremities.  Musculoskeletal: no joint deformities     Data Reviewed: I have personally reviewed following labs and imaging studies  CBC: Recent Labs  Lab 10/12/20 1740 10/12/20 2136 10/13/20 0256 10/14/20 0210 10/15/20 0322 10/16/20 0334 10/17/20 0336  WBC 18.4*   < > 19.5* 13.5* 18.4* 21.3* 17.7*  NEUTROABS 14.8*  --  15.7*  --   --   --   --   HGB 15.0   < > 12.1* 10.6* 9.3* 9.3* 9.3*  HCT 49.9   < > 39.0 34.3* 29.9* 29.8* 28.5*  MCV 111.6*   < > 109.2* 109.9* 107.2* 108.8* 102.2*  PLT 523*    < > 320 289 244 250 264   < > = values in this interval not displayed.   Basic Metabolic Panel: Recent Labs  Lab 10/13/20 0256 10/13/20 0535 10/14/20 0210 10/14/20 0904 10/15/20 0322 10/15/20 1452 10/16/20 0334 10/16/20 1422 10/17/20 0336 10/18/20 0607  NA  --    < > 156*   < > 155* 154* 153* 144 141 143  K  --    < > 4.9   < > 4.2 4.0 3.8 3.8 3.5 3.5  CL  --    < > 123*   < > 124* 123* 121* 114* 110 110  CO2  --    < > 21*   < > 23 23 25 22 23  21*  GLUCOSE  --    < > 111*   < > 116* 199* 140* 135* 123* 80  BUN  --    < > 129*   < > 95* 84* 67* 54* 43* 26*  CREATININE  --    < > 5.96*   < > 4.60* 3.88* 3.04* 2.40* 2.00* 1.46*  CALCIUM  --    < > 8.3*   < > 8.5* 8.9 8.9 8.6* 8.7* 9.3  MG 3.9*  --  2.7*  --  2.2  --  1.8  --  1.4*  --   PHOS  --    < > 4.1   < > 3.8  3.8 3.8 3.5 2.9 2.4*  --    < > = values in this interval not displayed.   GFR: Estimated Creatinine Clearance: 47.8 mL/min (A) (by C-G formula based on SCr of 1.46 mg/dL (H)). Liver Function Tests: Recent Labs  Lab 10/12/20 1854 10/13/20 0719 10/13/20 1559 10/14/20 1435 10/15/20 0322 10/15/20 1452 10/16/20 0334 10/16/20 1422  AST 118* 101*  --   --   --   --   --   --   ALT 42 33  --   --   --   --   --   --   ALKPHOS 61 43  --   --   --   --   --   --   BILITOT 1.5* 1.1  --   --   --   --   --   --   PROT 9.1* 6.3*  --   --   --   --   --   --   ALBUMIN 2.2* 1.6*   < > 1.4* 1.4* 1.5* 1.4* 1.4*   < > = values in this interval not displayed.   No results for input(s): LIPASE, AMYLASE in the last  168 hours. Recent Labs  Lab 10/12/20 1826  AMMONIA 46*   Coagulation Profile: Recent Labs  Lab 10/12/20 1740  INR 1.6*   Cardiac Enzymes: Recent Labs  Lab 10/12/20 1826 10/13/20 0256 10/15/20 0322 10/17/20 0336  CKTOTAL 2,442* 2,415* 1,430* 611*   BNP (last 3 results) No results for input(s): PROBNP in the last 8760 hours. HbA1C: No results for input(s): HGBA1C in the last 72  hours. CBG: Recent Labs  Lab 10/17/20 2145 10/18/20 0824 10/18/20 1233 10/18/20 1646 10/18/20 2113  GLUCAP 107* 92 93 106* 104*   Lipid Profile: No results for input(s): CHOL, HDL, LDLCALC, TRIG, CHOLHDL, LDLDIRECT in the last 72 hours. Thyroid Function Tests: No results for input(s): TSH, T4TOTAL, FREET4, T3FREE, THYROIDAB in the last 72 hours. Anemia Panel: No results for input(s): VITAMINB12, FOLATE, FERRITIN, TIBC, IRON, RETICCTPCT in the last 72 hours.    Radiology Studies: I have reviewed all of the imaging during this hospital visit personally     Scheduled Meds: . chlorhexidine  15 mL Mouth Rinse BID  . Chlorhexidine Gluconate Cloth  6 each Topical Q0600  . clonazePAM  0.5 mg Oral BID  . collagenase   Topical Daily  . enoxaparin (LOVENOX) injection  40 mg Subcutaneous Q24H  . feeding supplement  237 mL Oral TID WC & HS  . mouth rinse  15 mL Mouth Rinse q12n4p  . metoprolol tartrate  25 mg Oral BID  . multivitamin with minerals  1 tablet Oral Daily  . sodium chloride flush  10-40 mL Intracatheter Q12H  . thiamine  250 mg Oral BID   Continuous Infusions: . sodium chloride Stopped (10/14/20 0859)     LOS: 7 days        Jayshaun Phillips Gerome Apley, MD

## 2020-10-19 NOTE — NC FL2 (Signed)
[place] LEVEL OF CARE SCREENING TOOL     IDENTIFICATION  Patient Name: Raymond Stone Birthdate: [birth year] Sex: male Admission Date (Current Location): 10/12/2020  [facility] and [place] Number:  Herbalist and Address:  [facility] [place]. [facility], [place] [address], [place], [place] [address]      Provider Number: [id number]  Attending Physician Name and Address:  Tawni Millers,*  Relative Name and Phone Number:  [name] [phone]    Current Level of Care: Hospital Recommended Level of Care: [place] Prior Approval Number:    Date Approved/Denied:   PASRR Number: [id number]  Discharge Plan: SNF    Current Diagnoses: Patient Active Problem List   Diagnosis Date Noted  . Hypernatremia 10/18/2020  . Hyperkalemia 10/18/2020  . Acute metabolic encephalopathy 56/38/7564  . Hypovolemic shock ([place]) 10/18/2020  . ATN (acute tubular necrosis) ([place]) 10/18/2020  . Malnutrition of moderate degree 10/17/2020  . Pressure injury of skin 10/13/2020  . Acute respiratory failure with hypoxia ([place]) 11/24/2018  . Angioedema 05/22/2017  . 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 Endotracheally intubated   . History of alcohol dependence ([place]) 05/17/2017  . Leukocytosis 01/27/2017  . AKI (acute kidney injury) ([place]) 01/24/2017  . 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 Alcohol dependence ([place]) 01/24/2017  . Normocytic anemia 01/24/2017  . Thrombocytosis 01/24/2017  . Chronic diastolic CHF (congestive heart failure) ([place]) 01/24/2017  . Scrotal edema 10/20/2016  . Nephrotic syndrome 10/20/2016  . Essential hypertension 01/07/2016  . Severe eczema 04/16/2014    Orientation RESPIRATION BLADDER Height & Weight     Self  Normal Incontinent Weight: 168 lb (76.2 kg) Height:  5\' 9"  (175.3 cm)  BEHAVIORAL SYMPTOMS/MOOD NEUROLOGICAL BOWEL NUTRITION STATUS      Incontinent Diet  AMBULATORY STATUS  COMMUNICATION OF NEEDS Skin   Total Care Verbally Normal                       Personal Care Assistance Level of Assistance  Bathing,Feeding,Dressing Bathing Assistance: Maximum assistance Feeding assistance: Maximum assistance Dressing Assistance: Maximum assistance     Functional Limitations Info  Sight,Hearing,Speech Sight Info: Impaired Hearing Info: Adequate Speech Info: Adequate    SPECIAL CARE FACTORS FREQUENCY  PT (By licensed PT),OT (By licensed OT)     PT Frequency: 5x a week OT Frequency: 5x a week            Contractures Contractures Info: Not present    Additional Factors Info  Code Status,Allergies Code Status Info: Full Allergies Info: Lisinopril           Current Medications (10/19/2020):  This is the current hospital active medication list Current Facility-Administered Medications  Medication Dose Route Frequency Provider Last Rate Last Admin  . 0.9 %  sodium chloride infusion   Intravenous PRN Collier Bullock, MD   Stopped at 10/14/20 [phone]  . acetaminophen (TYLENOL) tablet 650 mg  650 mg Oral Q6H PRN Margaretha Seeds, MD   650 mg at 10/19/20 1105  . clonazePAM (KLONOPIN) tablet 0.5 mg  0.5 mg Oral BID Arrien, Jimmy Picket, MD   0.5 mg at 10/19/20 1102  . collagenase (SANTYL) ointment   Topical Daily Justin Mend, MD   Given at 10/19/20 [phone]  . docusate sodium (COLACE) capsule 100 mg  100 mg Oral BID PRN Collier Bullock, MD      . enoxaparin (LOVENOX) injection 40 mg  40 mg Subcutaneous  Q24H Arrien, Jimmy Picket, MD   40 mg at 10/18/20 1549  . feeding supplement (ENSURE ENLIVE / ENSURE PLUS) liquid 237 mL  237 mL Oral TID WC & HS Margaretha Seeds, MD   237 mL at 10/18/20 1739  . LORazepam (ATIVAN) tablet 1 mg  1 mg Oral Q4H PRN Arrien, Jimmy Picket, MD      . MEDLINE mouth rinse  15 mL Mouth Rinse q12n4p Collier Bullock, MD   15 mL at 10/18/20 1630  . metoprolol tartrate (LOPRESSOR) tablet 25 mg  25 mg Oral BID Tawni Millers, MD   25 mg at 10/19/20 1102  . multivitamin with minerals tablet 1 tablet  1 tablet Oral Daily Margaretha Seeds, MD   1 tablet at 10/19/20 1102  . ondansetron (ZOFRAN) injection 4 mg  4 mg Intravenous Q6H PRN Collier Bullock, MD      . polyethylene glycol (MIRALAX / GLYCOLAX) packet 17 g  17 g Oral Daily PRN Collier Bullock, MD      . sodium chloride flush (NS) 0.9 % injection 10-40 mL  10-40 mL Intracatheter Q12H Collier Bullock, MD   10 mL at 10/18/20 1043  . sodium chloride flush (NS) 0.9 % injection 10-40 mL  10-40 mL Intracatheter PRN Collier Bullock, MD      . thiamine tablet 250 mg  250 mg Oral BID Arrien, Jimmy Picket, MD   250 mg at 10/19/20 1102     Discharge Medications: Please see discharge summary for a list of discharge medications.  Relevant Imaging Results:  Relevant Lab Results:   Additional Information SSN [id number]  Emeterio Reeve, [place]

## 2020-10-20 ENCOUNTER — Inpatient Hospital Stay (HOSPITAL_COMMUNITY): Payer: Medicare Other

## 2020-10-20 DIAGNOSIS — G9341 Metabolic encephalopathy: Secondary | ICD-10-CM | POA: Diagnosis not present

## 2020-10-20 DIAGNOSIS — R571 Hypovolemic shock: Secondary | ICD-10-CM | POA: Diagnosis not present

## 2020-10-20 DIAGNOSIS — N179 Acute kidney failure, unspecified: Secondary | ICD-10-CM | POA: Diagnosis not present

## 2020-10-20 DIAGNOSIS — F1020 Alcohol dependence, uncomplicated: Secondary | ICD-10-CM | POA: Diagnosis not present

## 2020-10-20 LAB — URINALYSIS, ROUTINE W REFLEX MICROSCOPIC
Bilirubin Urine: NEGATIVE
Glucose, UA: >=500 mg/dL — AB
Ketones, ur: 5 mg/dL — AB
Leukocytes,Ua: NEGATIVE
Nitrite: NEGATIVE
Protein, ur: 30 mg/dL — AB
Specific Gravity, Urine: 1.02 (ref 1.005–1.030)
pH: 5 (ref 5.0–8.0)

## 2020-10-20 LAB — CBC WITH DIFFERENTIAL/PLATELET
Abs Immature Granulocytes: 0.27 10*3/uL — ABNORMAL HIGH (ref 0.00–0.07)
Basophils Absolute: 0 10*3/uL (ref 0.0–0.1)
Basophils Relative: 0 %
Eosinophils Absolute: 0.3 10*3/uL (ref 0.0–0.5)
Eosinophils Relative: 1 %
HCT: 30.3 % — ABNORMAL LOW (ref 39.0–52.0)
Hemoglobin: 10 g/dL — ABNORMAL LOW (ref 13.0–17.0)
Immature Granulocytes: 1 %
Lymphocytes Relative: 5 %
Lymphs Abs: 0.9 10*3/uL (ref 0.7–4.0)
MCH: 33.7 pg (ref 26.0–34.0)
MCHC: 33 g/dL (ref 30.0–36.0)
MCV: 102 fL — ABNORMAL HIGH (ref 80.0–100.0)
Monocytes Absolute: 1.1 10*3/uL — ABNORMAL HIGH (ref 0.1–1.0)
Monocytes Relative: 6 %
Neutro Abs: 16.1 10*3/uL — ABNORMAL HIGH (ref 1.7–7.7)
Neutrophils Relative %: 87 %
Platelets: 547 10*3/uL — ABNORMAL HIGH (ref 150–400)
RBC: 2.97 MIL/uL — ABNORMAL LOW (ref 4.22–5.81)
RDW: 13.8 % (ref 11.5–15.5)
WBC: 18.7 10*3/uL — ABNORMAL HIGH (ref 4.0–10.5)
nRBC: 0 % (ref 0.0–0.2)

## 2020-10-20 LAB — BASIC METABOLIC PANEL
Anion gap: 12 (ref 5–15)
BUN: 24 mg/dL — ABNORMAL HIGH (ref 8–23)
CO2: 22 mmol/L (ref 22–32)
Calcium: 9.7 mg/dL (ref 8.9–10.3)
Chloride: 110 mmol/L (ref 98–111)
Creatinine, Ser: 1.11 mg/dL (ref 0.61–1.24)
GFR, Estimated: >60 mL/min (ref >60)
Glucose, Bld: 101 mg/dL — ABNORMAL HIGH (ref 70–99)
Potassium: 3.5 mmol/L (ref 3.5–5.1)
Sodium: 144 mmol/L (ref 135–145)

## 2020-10-20 LAB — GLUCOSE, CAPILLARY
Glucose-Capillary: 104 mg/dL — ABNORMAL HIGH (ref 70–99)
Glucose-Capillary: 147 mg/dL — ABNORMAL HIGH (ref 70–99)
Glucose-Capillary: 119 mg/dL — ABNORMAL HIGH (ref 70–99)

## 2020-10-20 MED ORDER — VANCOMYCIN HCL 1500 MG/300ML IV SOLN
1500.0000 mg | INTRAVENOUS | Status: DC
  Administered 2020-10-21 – 2020-10-23 (×3): 1500 mg via INTRAVENOUS
  Filled 2020-10-20 (×3): qty 300

## 2020-10-20 MED ORDER — VANCOMYCIN HCL 10 G IV SOLR
1750.0000 mg | Freq: Once | INTRAVENOUS | Status: AC
  Administered 2020-10-20: 1750 mg via INTRAVENOUS
  Filled 2020-10-20: qty 1750

## 2020-10-20 MED ORDER — SODIUM CHLORIDE 0.9 % IV SOLN: 1.0000 g | INTRAVENOUS | Status: DC

## 2020-10-20 MED ORDER — LORAZEPAM 2 MG/ML IJ SOLN
1.0000 mg | Freq: Once | INTRAMUSCULAR | Status: AC
  Administered 2020-10-20: 1 mg via INTRAVENOUS
  Filled 2020-10-20: qty 1

## 2020-10-20 MED ORDER — METOPROLOL TARTRATE 5 MG/5ML IV SOLN
5.0000 mg | Freq: Once | INTRAVENOUS | Status: DC
  Filled 2020-10-20: qty 5

## 2020-10-20 MED ORDER — METOPROLOL TARTRATE 5 MG/5ML IV SOLN
5.0000 mg | Freq: Once | INTRAVENOUS | Status: AC
  Administered 2020-10-20: 5 mg via INTRAVENOUS

## 2020-10-20 MED ORDER — ACETAMINOPHEN 650 MG RE SUPP
650.0000 mg | Freq: Four times a day (QID) | RECTAL | Status: DC | PRN
  Administered 2020-10-20 – 2020-10-21 (×2): 650 mg via RECTAL
  Filled 2020-10-20 (×2): qty 1

## 2020-10-20 MED ORDER — METOPROLOL TARTRATE 5 MG/5ML IV SOLN
5.0000 mg | Freq: Once | INTRAVENOUS | Status: AC
  Filled 2020-10-20: qty 5

## 2020-10-20 MED ORDER — SODIUM CHLORIDE 0.9 % IV SOLN
2.0000 g | INTRAVENOUS | Status: DC
  Administered 2020-10-20 – 2020-10-24 (×5): 2 g via INTRAVENOUS
  Filled 2020-10-20 (×4): qty 20
  Filled 2020-10-20 (×2): qty 2

## 2020-10-20 MED ORDER — DEXTROSE-NACL 5-0.45 % IV SOLN: INTRAVENOUS | Status: DC

## 2020-10-20 NOTE — Progress Notes (Signed)
Pharmacy Antibiotic Note  Raymond Stone is a 70 y.o. male admitted on 10/12/2020 after he was found down for an unknown amount of time. He had several pressure injuries and hypovolemic shock leading to AKI. Renal function has improved and CrCl ~63. WBC up to 18.7 today and pt is febrile Tmax 100.6 with tachycardia. Pharmacy has been consulted for vancomycin and ceftriaxone dosing for possible sepsis.   Estimated AUC - 483 using SCr 1.11  Plan: Give vancomycin load 1,750mg  IV Initiate vancomycin 1,500mg  IV every 24 hours Follow up with cultures, antibiotic de-escalation and LOT Monitor renal function cafeully and follow clinical progress Monitor vanc levels when indicated   Height: 5\' 9"  (175.3 cm) Weight:  (with ther air beds there not weight button patient is be bound) IBW/kg (Calculated) : 70.7  Temp (24hrs), Avg:99.3 F (37.4 C), Min:98.2 F (36.8 C), Max:100.6 F (38.1 C)  Recent Labs  Lab 10/14/20 0210 10/14/20 0904 10/15/20 0322 10/15/20 1452 10/16/20 0334 10/16/20 1422 10/17/20 0336 10/18/20 0607 10/20/20 0112 10/20/20 0134  WBC 13.5*  --  18.4*  --  21.3*  --  17.7*  --   --  18.7*  CREATININE 5.96*   < > 4.60*   < > 3.04* 2.40* 2.00* 1.46* 1.11  --    < > = values in this interval not displayed.    Estimated Creatinine Clearance: 62.8 mL/min (by C-G formula based on SCr of 1.11 mg/dL).    Allergies  Allergen Reactions  . Lisinopril Swelling    ANGIOEDEMA    Antimicrobials this admission: Ceftriaxone 4/10 >>  Vancomycin 4/10 >>   Microbiology results: 4/2 BCx: ng, f  4/10 BCx: ng x12h 4/3 MRSA PCR: neg  Thank you for allowing pharmacy to be a part of this patient's care.  Mercy Riding, PharmD PGY1 Acute Care Pharmacy Resident Please refer to [facility] for unit-specific pharmacist

## 2020-10-20 NOTE — Progress Notes (Signed)
Notified by RN that pt has fever to 100.6 F.  Pt is refusing po meds. HR is in 130s. No complaint of chest pain, SOB or cough. Had CBC on 10/17/20 with WBC of 17,000  Ordered Blood cultures, CXR, UA.  Ordered EKG Metoprolol IV after EKG reviewed.  Tylenol suppository ordered for fever.

## 2020-10-20 NOTE — Progress Notes (Signed)
PROGRESS NOTE    Jarmarcus Wambold  WNI:[id number] DOB: [birth year] DOA: 10/12/2020 PCP: [name], NP    Brief Narrative:  Mr. Brodhead admitted to the hospital with working diagnosis hypovolemic shock complicated by acute kidney injury/ATN.  70 year old male past medical history of hypertension who was found down, apparently he was not on scene for about 5 days. When EMS arrived he was severely hypotensive, his temperature was 100 F he received intravenous fluids and transported to the emergency room. On his initial physical examination his blood pressure was 111/66, heart rate 109, temperature 37.8 C, respiratory rate 29, oxygen saturation 95% he was confused and disoriented, dry mucous membranes, lungs were clear to auscultation bilaterally, heart S1-S2, present, rhythmic, no gallops rubs or murmurs, his abdomen was soft, no lower extremity edema. He was neurologically nonfocal.  Sodium 172, potassium 7.3, chloride>130,bicarb 15, glucose 156, BUN 211, creatinine 11.2, AST 118, ALT 42, total bilirubin is 1.5, troponin I 489-395, lactic acid 5.3. anion gap 49. ABG, pH 7.37, PCO2 26, PO2 96, bicarb 15 White cell count 18.7, hemoglobin 11.1, hematocrit 37.6, platelets 346. SARS COVID-19 negative. Urinalysis specific gravity 1.026, negative protein, 21-50 white cells, 0-5 red cells. Toxicology negative. Head CT negative for acute changes.  Chest radiograph no infiltrates. EKG had 19 bpm, normal axis, normal intervals, sinus rhythm, no ST segment or T wave changes.  Patient was diagnosed with shock, he received intravenous fluids and broad-spectrum antibiotic therapy. Patient underwent CRRT04/03with further improvement of his kidney function.  Peak CK 2,415 mild rhabdomyolysis likely not the culprit of renal failure.  Transferred to [facility]04/07.  Patient very debilitated, pending PT and OT evaluation, likely will need SNF for short term rehab.  Persistent  tachycardia, now with fevers and worsening wbc, urine analysis negative for UTI and chest film with no infiltrates.  Start antibiotics for complex wounds infection with sepsis.   Assessment & Plan:   Principal Problem:   Hypovolemic shock (HCC) Active Problems:   Essential hypertension   AKI (acute kidney injury) ([place])   Alcohol dependence (HCC)   Pressure injury of skin   Malnutrition of moderate degree   Hypernatremia   Hyperkalemia   Acute metabolic encephalopathy   ATN (acute tubular necrosis) ([place])    1. Hypovolemic shock with AKI/ ATN, uremic encephalopathy, hyperkalemia, hypernatremia, anion gap metabolic acidosis.Hypomagnesemia. Renal function has been stable, now patient with poor oral intake, no nausea or vomiting, has declined oral medications.   Serum cr is 1,1 with K at 3,5 and serum bicarbonate at 24 with Na 144. Will resume IV fluids with D5 and half normal saline in the setting of poor oral intake and sepsis.  Close follow up on renal function and electrolytes, no signs of urinary retention.   2. Complex wounds with NEW infection and sepsis (not present on admission). Patient with complex wounds, left hemithorax and left thigh, positive fever and worsening leukocytosis, today wbc is 18.7, he has been tachycardic at 130 bpm.  Will start patient on broad spectrum antibiotic therapy with vancomycin and ceftriaxone, to cover potential drug resistant bacteria. Volume support with D5 0.45% saline at 100 ml per hr.   Follow up on blood cultures.   2. HTN.Continue metoprolol for rate control, blood pressure 114/95 this am.   3. Etho abuse/ metabolic encephalopathy . Patient drinks alcohol at home, Patient has declined po meds yesterday, will continue to encourage to take bid clonazepam, and as needed lorazepam.  He has been confused and disorientated but not  agitated.   4.Moderatecalorie protein malnutrition.continue with nutritional supplements, plus  multivitamins and Thiamine 250 mg po bid. Has declined po intake.   5. Aspiration pneumonitis (present on admission).Ruled out for bacterial pneumonia.  Continue with aspiration precautions.   Patient continue to be at high risk for worsening sepsis   Status is: Inpatient  Remains inpatient appropriate because:IV treatments appropriate due to intensity of illness or inability to take PO   Dispo: The patient is from: Home              Anticipated d/c is to: SNF              Patient currently is not medically stable to d/c.   Difficult to place patient No   DVT prophylaxis: Enoxaparin   Code Status:   full  Family Communication:  No family at the bedside     Nutrition Status: Nutrition Problem: Moderate Malnutrition Etiology: chronic illness (CHF) Signs/Symptoms: mild fat depletion,moderate muscle depletion Interventions: Ensure Enlive (each supplement provides 350kcal and 20 grams of protein),Magic cup,MVI     Skin Documentation: Pressure Injury 10/13/20 Thigh Anterior;Left;Proximal Unstageable - Full thickness tissue loss in which the base of the injury is covered by slough (yellow, tan, gray, green or brown) and/or eschar (tan, brown or black) in the wound bed. (Active)  10/13/20 0200  Location: Thigh  Location Orientation: Anterior;Left;Proximal  Staging: Unstageable - Full thickness tissue loss in which the base of the injury is covered by slough (yellow, tan, gray, green or brown) and/or eschar (tan, brown or black) in the wound bed.  Wound Description (Comments):   Present on Admission: Yes     Pressure Injury 10/13/20 Knee Anterior;Left Deep Tissue Pressure Injury - Purple or maroon localized area of discolored intact skin or blood-filled blister due to damage of underlying soft tissue from pressure and/or shear. (Active)  10/13/20 0200  Location: Knee  Location Orientation: Anterior;Left  Staging: Deep Tissue Pressure Injury - Purple or maroon localized area  of discolored intact skin or blood-filled blister due to damage of underlying soft tissue from pressure and/or shear.  Wound Description (Comments):   Present on Admission: Yes     Pressure Injury 10/13/20 Flank Left;Upper Unstageable - Full thickness tissue loss in which the base of the injury is covered by slough (yellow, tan, gray, green or brown) and/or eschar (tan, brown or black) in the wound bed. (Active)  10/13/20 0200  Location: Flank  Location Orientation: Left;Upper  Staging: Unstageable - Full thickness tissue loss in which the base of the injury is covered by slough (yellow, tan, gray, green or brown) and/or eschar (tan, brown or black) in the wound bed.  Wound Description (Comments):   Present on Admission: Yes     Pressure Injury 10/13/20 Toe (Comment  which one) Anterior;Right Deep Tissue Pressure Injury - Purple or maroon localized area of discolored intact skin or blood-filled blister due to damage of underlying soft tissue from pressure and/or shear. (Active)  10/13/20 0200  Location: Toe (Comment  which one)  Location Orientation: Anterior;Right  Staging: Deep Tissue Pressure Injury - Purple or maroon localized area of discolored intact skin or blood-filled blister due to damage of underlying soft tissue from pressure and/or shear.  Wound Description (Comments):   Present on Admission: Yes     Pressure Injury 10/13/20 Knee Anterior;Right Unstageable - Full thickness tissue loss in which the base of the injury is covered by slough (yellow, tan, gray, green or brown) and/or eschar (tan, brown  or black) in the wound bed. (Active)  10/13/20 0800  Location: Knee  Location Orientation: Anterior;Right  Staging: Unstageable - Full thickness tissue loss in which the base of the injury is covered by slough (yellow, tan, gray, green or brown) and/or eschar (tan, brown or black) in the wound bed.  Wound Description (Comments):   Present on Admission: Yes     Pressure Injury  10/13/20 Ankle Posterior;Right Deep Tissue Pressure Injury - Purple or maroon localized area of discolored intact skin or blood-filled blister due to damage of underlying soft tissue from pressure and/or shear. (Active)  10/13/20 1900  Location: Ankle  Location Orientation: Posterior;Right  Staging: Deep Tissue Pressure Injury - Purple or maroon localized area of discolored intact skin or blood-filled blister due to damage of underlying soft tissue from pressure and/or shear.  Wound Description (Comments):   Present on Admission: Yes     Pressure Injury 10/13/20 Knee Left;Lateral Deep Tissue Pressure Injury - Purple or maroon localized area of discolored intact skin or blood-filled blister due to damage of underlying soft tissue from pressure and/or shear. (Active)  10/13/20 1900  Location: Knee  Location Orientation: Left;Lateral  Staging: Deep Tissue Pressure Injury - Purple or maroon localized area of discolored intact skin or blood-filled blister due to damage of underlying soft tissue from pressure and/or shear.  Wound Description (Comments):   Present on Admission: Yes     Pressure Injury 10/13/20 Flank Left;Mid Unstageable - Full thickness tissue loss in which the base of the injury is covered by slough (yellow, tan, gray, green or brown) and/or eschar (tan, brown or black) in the wound bed. (Active)  10/13/20 1900  Location: Flank  Location Orientation: Left;Mid  Staging: Unstageable - Full thickness tissue loss in which the base of the injury is covered by slough (yellow, tan, gray, green or brown) and/or eschar (tan, brown or black) in the wound bed.  Wound Description (Comments):   Present on Admission: Yes     Pressure Injury 10/13/20 Elbow Left;Posterior Deep Tissue Pressure Injury - Purple or maroon localized area of discolored intact skin or blood-filled blister due to damage of underlying soft tissue from pressure and/or shear. (Active)  10/13/20 1900  Location: Elbow   Location Orientation: Left;Posterior  Staging: Deep Tissue Pressure Injury - Purple or maroon localized area of discolored intact skin or blood-filled blister due to damage of underlying soft tissue from pressure and/or shear.  Wound Description (Comments):   Present on Admission: Yes     Pressure Injury 10/13/20 Elbow Posterior;Right Deep Tissue Pressure Injury - Purple or maroon localized area of discolored intact skin or blood-filled blister due to damage of underlying soft tissue from pressure and/or shear. (Active)  10/13/20 1900  Location: Elbow  Location Orientation: Posterior;Right  Staging: Deep Tissue Pressure Injury - Purple or maroon localized area of discolored intact skin or blood-filled blister due to damage of underlying soft tissue from pressure and/or shear.  Wound Description (Comments):   Present on Admission: Yes      Antimicrobials:   Vancomycin   Ceftriaxone     Subjective: Patient has been confused and disorientated, has been refusing to eat and to take is oral medications, early this am with fever.   Objective: Vitals:   10/19/20 2222 10/20/20 0025 10/20/20 0337 10/20/20 0502  BP: (!) 159/80 134/79  (!) 114/95  Pulse: (!) 125 (!) 125  (!) 104  Resp:  17  14  Temp: 99.5 F (37.5 C) (!) 100.6 F (38.1  C) 98.8 F (37.1 C) 99.7 F (37.6 C)  TempSrc: Axillary Axillary Axillary Axillary  SpO2: 98% 99%  98%  Weight:      Height:        Intake/Output Summary (Last 24 hours) at 10/20/2020 1036 Last data filed at 10/20/2020 0038 Gross per 24 hour  Intake 200 ml  Output 1200 ml  Net -1000 ml   Filed Weights   10/16/20 0500 10/18/20 0546 10/19/20 0525  Weight: 78.1 kg 79.4 kg 76.2 kg    Examination:   General: deconditioned and ill looking appearing  Neurology: Awake and alert, non focal  E ENT: positive pallor, no icterus, oral mucosa moist Cardiovascular: No JVD. S1-S2 present, rhythmic, no gallops, rubs, or murmurs. Trace lower extremity  edema. Pulmonary: positive breath sounds bilaterally, adequate air movement, no wheezing, rhonchi or rales. Gastrointestinal. Abdomen soft and non tender Skin. Multiple complex wounds on his left hemithorax and left thigh Musculoskeletal: no joint deformities    Left thigh      Left hemithorax   Data Reviewed: I have personally reviewed following labs and imaging studies  CBC: Recent Labs  Lab 10/14/20 0210 10/15/20 0322 10/16/20 0334 10/17/20 0336 10/20/20 0134  WBC 13.5* 18.4* 21.3* 17.7* 18.7*  NEUTROABS  --   --   --   --  16.1*  HGB 10.6* 9.3* 9.3* 9.3* 10.0*  HCT 34.3* 29.9* 29.8* 28.5* 30.3*  MCV 109.9* 107.2* 108.8* 102.2* 102.0*  PLT 289 244 250 264 160*   Basic Metabolic Panel: Recent Labs  Lab 10/14/20 0210 10/14/20 0904 10/15/20 0322 10/15/20 1452 10/16/20 0334 10/16/20 1422 10/17/20 0336 10/18/20 0607 10/20/20 0112  NA 156*   < > 155* 154* 153* 144 141 143 144  K 4.9   < > 4.2 4.0 3.8 3.8 3.5 3.5 3.5  CL 123*   < > 124* 123* 121* 114* 110 110 110  CO2 21*   < > 23 23 25 22 23  21* 22  GLUCOSE 111*   < > 116* 199* 140* 135* 123* 80 101*  BUN 129*   < > 95* 84* 67* 54* 43* 26* 24*  CREATININE 5.96*   < > 4.60* 3.88* 3.04* 2.40* 2.00* 1.46* 1.11  CALCIUM 8.3*   < > 8.5* 8.9 8.9 8.6* 8.7* 9.3 9.7  MG 2.7*  --  2.2  --  1.8  --  1.4*  --   --   PHOS 4.1   < > 3.8  3.8 3.8 3.5 2.9 2.4*  --   --    < > = values in this interval not displayed.   GFR: Estimated Creatinine Clearance: 62.8 mL/min (by C-G formula based on SCr of 1.11 mg/dL). Liver Function Tests: Recent Labs  Lab 10/14/20 1435 10/15/20 0322 10/15/20 1452 10/16/20 0334 10/16/20 1422  ALBUMIN 1.4* 1.4* 1.5* 1.4* 1.4*   No results for input(s): LIPASE, AMYLASE in the last 168 hours. No results for input(s): AMMONIA in the last 168 hours. Coagulation Profile: No results for input(s): INR, PROTIME in the last 168 hours. Cardiac Enzymes: Recent Labs  Lab 10/15/20 0322  10/17/20 0336  CKTOTAL 1,430* 611*   BNP (last 3 results) No results for input(s): PROBNP in the last 8760 hours. HbA1C: No results for input(s): HGBA1C in the last 72 hours. CBG: Recent Labs  Lab 10/18/20 2113 10/19/20 1334 10/19/20 1619 10/19/20 2220 10/20/20 0747  GLUCAP 104* 107* 102* 99 104*   Lipid Profile: No results for input(s): CHOL, HDL, LDLCALC, TRIG,  CHOLHDL, LDLDIRECT in the last 72 hours. Thyroid Function Tests: No results for input(s): TSH, T4TOTAL, FREET4, T3FREE, THYROIDAB in the last 72 hours. Anemia Panel: No results for input(s): VITAMINB12, FOLATE, FERRITIN, TIBC, IRON, RETICCTPCT in the last 72 hours.    Radiology Studies: I have reviewed all of the imaging during this hospital visit personally     Scheduled Meds: . clonazePAM  0.5 mg Oral BID  . collagenase   Topical Daily  . enoxaparin (LOVENOX) injection  40 mg Subcutaneous Q24H  . feeding supplement  237 mL Oral TID WC & HS  . mouth rinse  15 mL Mouth Rinse q12n4p  . metoprolol tartrate  25 mg Oral BID  . multivitamin with minerals  1 tablet Oral Daily  . sodium chloride flush  10-40 mL Intracatheter Q12H  . thiamine  250 mg Oral BID   Continuous Infusions: . sodium chloride Stopped (10/14/20 0859)     LOS: 8 days        Odette Watanabe Gerome Apley, MD

## 2020-10-21 DIAGNOSIS — F1020 Alcohol dependence, uncomplicated: Secondary | ICD-10-CM | POA: Diagnosis not present

## 2020-10-21 DIAGNOSIS — G9341 Metabolic encephalopathy: Secondary | ICD-10-CM | POA: Diagnosis not present

## 2020-10-21 DIAGNOSIS — R571 Hypovolemic shock: Secondary | ICD-10-CM | POA: Diagnosis not present

## 2020-10-21 DIAGNOSIS — N179 Acute kidney failure, unspecified: Secondary | ICD-10-CM | POA: Diagnosis not present

## 2020-10-21 LAB — CBC WITH DIFFERENTIAL/PLATELET
Abs Immature Granulocytes: 0.19 10*3/uL — ABNORMAL HIGH (ref 0.00–0.07)
Basophils Absolute: 0 10*3/uL (ref 0.0–0.1)
Basophils Relative: 0 %
Eosinophils Absolute: 0.3 10*3/uL (ref 0.0–0.5)
Eosinophils Relative: 2 %
HCT: 26.5 % — ABNORMAL LOW (ref 39.0–52.0)
Hemoglobin: 8.7 g/dL — ABNORMAL LOW (ref 13.0–17.0)
Immature Granulocytes: 1 %
Lymphocytes Relative: 5 %
Lymphs Abs: 0.8 10*3/uL (ref 0.7–4.0)
MCH: 33.6 pg (ref 26.0–34.0)
MCHC: 32.8 g/dL (ref 30.0–36.0)
MCV: 102.3 fL — ABNORMAL HIGH (ref 80.0–100.0)
Monocytes Absolute: 1 10*3/uL (ref 0.1–1.0)
Monocytes Relative: 6 %
Neutro Abs: 12.7 10*3/uL — ABNORMAL HIGH (ref 1.7–7.7)
Neutrophils Relative %: 86 %
Platelets: 580 10*3/uL — ABNORMAL HIGH (ref 150–400)
RBC: 2.59 MIL/uL — ABNORMAL LOW (ref 4.22–5.81)
RDW: 13.9 % (ref 11.5–15.5)
WBC: 15 10*3/uL — ABNORMAL HIGH (ref 4.0–10.5)
nRBC: 0 % (ref 0.0–0.2)

## 2020-10-21 LAB — GLUCOSE, CAPILLARY
Glucose-Capillary: 107 mg/dL — ABNORMAL HIGH (ref 70–99)
Glucose-Capillary: 109 mg/dL — ABNORMAL HIGH (ref 70–99)
Glucose-Capillary: 96 mg/dL (ref 70–99)
Glucose-Capillary: 101 mg/dL — ABNORMAL HIGH (ref 70–99)

## 2020-10-21 LAB — BASIC METABOLIC PANEL
Anion gap: 6 (ref 5–15)
BUN: 24 mg/dL — ABNORMAL HIGH (ref 8–23)
CO2: 21 mmol/L — ABNORMAL LOW (ref 22–32)
Calcium: 9.2 mg/dL (ref 8.9–10.3)
Chloride: 113 mmol/L — ABNORMAL HIGH (ref 98–111)
Creatinine, Ser: 1.04 mg/dL (ref 0.61–1.24)
GFR, Estimated: >60 mL/min (ref >60)
Glucose, Bld: 122 mg/dL — ABNORMAL HIGH (ref 70–99)
Potassium: 3.2 mmol/L — ABNORMAL LOW (ref 3.5–5.1)
Sodium: 140 mmol/L (ref 135–145)

## 2020-10-21 MED ORDER — POTASSIUM CHLORIDE 10 MEQ/100ML IV SOLN
10.0000 meq | INTRAVENOUS | Status: AC
  Administered 2020-10-21 (×5): 10 meq via INTRAVENOUS
  Filled 2020-10-21 (×4): qty 100

## 2020-10-21 MED ORDER — POTASSIUM CHLORIDE 10 MEQ/100ML IV SOLN
10.0000 meq | Freq: Once | INTRAVENOUS | Status: AC
  Administered 2020-10-21: 10 meq via INTRAVENOUS
  Filled 2020-10-21: qty 100

## 2020-10-21 MED ORDER — THIAMINE HCL 100 MG/ML IJ SOLN
250.0000 mg | Freq: Two times a day (BID) | INTRAVENOUS | Status: DC
  Administered 2020-10-21 – 2020-10-24 (×7): 250 mg via INTRAVENOUS
  Filled 2020-10-21 (×10): qty 2.5

## 2020-10-21 NOTE — Progress Notes (Addendum)
Physical Therapy Wound Treatment Patient Details  Name: Raymond Stone MRN: [id number] Date of Birth: [birth year]  Today's Date: 10/21/2020 Time: 1120-     Subjective  Subjective Assessment Subjective: Pt appears frustrated with therapist and reports "just wanting to go to sleep". Patient and Family Stated Goals: None stated Date of Onset:  (Unsure) Prior Treatments: Dressing changes prior to hydro being consulted.  Pain Score:  Pt grimacing at times but does not admit to pain when asked.   Wound Assessment  Pressure Injury 10/13/20 Flank Left;Mid Unstageable - Full thickness tissue loss in which the base of the injury is covered by slough (yellow, tan, gray, green or brown) and/or eschar (tan, brown or black) in the wound bed. (Active)  Dressing Type Barrier Film (skin prep);Gauze (Comment);Moist to moist;Santyl 10/21/20 1242  Dressing Changed;Clean;Dry;Intact 10/21/20 1242  Dressing Change Frequency Daily 10/21/20 1242  State of Healing Eschar 10/21/20 1242  Site / Wound Assessment Pink;Yellow;Brown 10/21/20 1242  % Wound base Red or Granulating 10% 10/21/20 1242  % Wound base Yellow/Fibrinous Exudate 75% 10/21/20 1242  % Wound base Black/Eschar 5% 10/21/20 1242  % Wound base Other/Granulation Tissue (Comment) 0% 10/21/20 1242  Peri-wound Assessment Intact;Black 10/20/20 0615  Wound Length (cm) 5.5 cm 10/15/20 1200  Wound Width (cm) 2 cm 10/15/20 1200  Wound Depth (cm) 0.1 cm 10/15/20 1200  Wound Surface Area (cm^2) 11 cm^2 10/15/20 1200  Wound Volume (cm^3) 1.1 cm^3 10/15/20 1200  Tunneling (cm) 0 10/15/20 1349  Undermining (cm) 0 10/15/20 1349  Margins Unattached edges (unapproximated) 10/20/20 2059  Drainage Amount Scant 10/20/20 2059  Drainage Description Serosanguineous 10/20/20 2059  Treatment Cleansed 10/20/20 2059      Hydrotherapy Pulsed lavage therapy - wound location: L flank Pulsed Lavage with Suction (psi): 8 psi Pulsed Lavage with Suction - Normal Saline Used:  1000 mL Pulsed Lavage Tip: Tip with splash shield Selective Debridement Selective Debridement - Location: L flank Selective Debridement - Tools Used: Scalpel,Forceps Selective Debridement - Tissue Removed: remaining black eschar, nd yellow slough    Wound Assessment and Plan  Wound Therapy - Assess/Plan/Recommendations Wound Therapy - Clinical Statement: Pt appears frustrated at times, especially if therapist initiated any line of questioning. L flank wound appears improved however will benefit from continued hydrotherapy for selective removal of uviable tissue, to decrease bioburden and promote wound bed healing. Wound Therapy - Functional Problem List: Global weakness in the setting of undetermined time down. Factors Delaying/Impairing Wound Healing: Infection - systemic/local,Immobility Hydrotherapy Plan: Debridement,Dressing change,Patient/family education,Pulsatile lavage with suction Wound Therapy - Frequency: 3X / week Wound Therapy - Current Recommendations: PT Wound Therapy - Follow Up Recommendations: dressing changes by RN  Wound Therapy Goals- Improve the function of patient's integumentary system by progressing the wound(s) through the phases of wound healing (inflammation - proliferation - remodeling) by: Wound Therapy Goals - Improve the function of patient's integumentary system by progressing the wound(s) through the phases of wound healing by: Decrease Necrotic Tissue to: 20% Decrease Necrotic Tissue - Progress: Progressing toward goal Increase Granulation Tissue to: 80% Increase Granulation Tissue - Progress: Progressing toward goal Goals/treatment plan/discharge plan were made with and agreed upon by patient/family: No, Patient unable to participate in goals/treatment/discharge plan and family unavailable Time For Goal Achievement: 7 days Wound Therapy - Potential for Goals: Good  Goals will be updated until maximal potential achieved or discharge criteria met.   Discharge criteria: when goals achieved, discharge from hospital, MD decision/surgical intervention, no progress towards goals, refusal/missing three consecutive treatments  without notification or medical reason.  GP     Charges PT Wound Care Charges $Wound Debridement up to 20 cm: < or equal to 20 cm $PT Hydrotherapy Dressing: 1 dressing $PT PLS Gun and Tip: 1 Supply $PT Hydrotherapy Visit: 1 Visit       Thelma Comp 10/21/2020, 12:50 PM   Rolinda Roan, PT, DPT Acute Rehabilitation Services Pager: [phone] Office: [phone]

## 2020-10-21 NOTE — Progress Notes (Addendum)
PROGRESS NOTE    Raymond Stone  RWE:[id number] DOB: [birth year] DOA: 10/12/2020 PCP: [name], NP    Brief Narrative:  [name] admitted to the hospital with working diagnosis hypovolemic shock complicated by acute kidney injury/ATN.  69 year old male past medical history of hypertension who was found down, apparently he was not on scene for about 5 days. When EMS arrived he was severely hypotensive, his temperature was 100 F he received intravenous fluids and transported to the emergency room. On his initial physical examination his blood pressure was 111/66, heart rate 109, temperature 37.8 C, respiratory rate 29, oxygen saturation 95% he was confused and disoriented, dry mucous membranes, lungs were clear to auscultation bilaterally, heart S1-S2, present, rhythmic, no gallops rubs or murmurs, his abdomen was soft, no lower extremity edema. He was neurologically nonfocal.  Sodium 172, potassium 7.3, chloride>130,bicarb 15, glucose 156, BUN 211, creatinine 11.2, AST 118, ALT 42, total bilirubin is 1.5, troponin I 489-395, lactic acid 5.3. anion gap 49. ABG, pH 7.37, PCO2 26, PO2 96, bicarb 15 White cell count 18.7, hemoglobin 11.1, hematocrit 37.6, platelets 346. SARS COVID-19 negative. Urinalysis specific gravity 1.026, negative protein, 21-50 white cells, 0-5 red cells. Toxicology negative. Head CT negative for acute changes.  Chest radiograph no infiltrates. EKG had 19 bpm, normal axis, normal intervals, sinus rhythm, no ST segment or T wave changes.  Patient was diagnosed with shock, he received intravenous fluids and broad-spectrum antibiotic therapy. Patient underwent CRRT04/03with further improvement of his kidney function.  Peak CK 2,415 mild rhabdomyolysis likely not the culprit of renal failure.  Transferred to [facility]04/07.  Patient very debilitated, pending PT and OT evaluation, likely will need SNF for short term rehab.  Persistent  tachycardia, now with fevers and worsening wbc, urine analysis negative for UTI and chest film with no infiltrates.  Start antibiotics for complex wounds infection with sepsis., 04/10.    Assessment & Plan:   Principal Problem:   Hypovolemic shock (HCC) Active Problems:   Essential hypertension   AKI (acute kidney injury) ([place])   Alcohol dependence (HCC)   Pressure injury of skin   Malnutrition of moderate degree   Hypernatremia   Hyperkalemia   Acute metabolic encephalopathy   ATN (acute tubular necrosis) ([place])   1. Hypovolemic shock with AKI/ ATN, uremic encephalopathy, hyperkalemia/ hypokalemia, hypernatremia, anion gap metabolic acidosis.Hypomagnesemia. Renal function with serum cr at 1,0, Na 140 and K at 3,2 with bicarbonate at 21.   Decrease IV fluids with D5 and half normal saline to 75 ml per hr to prevent hypervolemia.  Correct K with 60 meq Kcl, IV, patient with poor oral intake, has being refusing oral medications.   2. Complex wounds with NEW infection and sepsis (not present on admission). Patient with complex wounds, left hemithorax and left thigh,   Continue to be tachycardic 114 to 120, blood pressure 619 mmHg systolic.  Blood cultures with no growth. Wbc continue to be elevated at 15 from 18,7.  Continue with broad spectrum antibiotic therapy with vancomycin and ceftriaxone.   2. HTN.Metoprolol for blood pressure and rate control, he has been refusing po medications.  3. Etho abuse/ metabolic encephalopathy. Patient drinks alcohol at home, Continue with bid clonazepam and as needed lorazepam.  Patient today is calm and responding to simple questions, not clear his baseline.  Continue multivitamins as tolerated and change thiamine to IV since he refused to take oral medications.   4.Moderatecalorie protein malnutrition.Nutritional supplements, plus multivitamins, high dose thiamine 250 mg po  bid. Intermittent po intake, still very poor.   5.  Aspiration pneumonitis (present on admission).Ruled out for bacterial pneumonia.  Aspiration precautions.  Patient continue to be at high risk for worsening sepsis   Status is: Inpatient  Remains inpatient appropriate because:IV treatments appropriate due to intensity of illness or inability to take PO   Dispo: The patient is from: Home              Anticipated d/c is to: SNF              Patient currently is not medically stable to d/c.   Difficult to place patient No   DVT prophylaxis: enoxaparin   Code Status:    full Family Communication:  No family at the bedside    I spoke over the phone with the patient's daughter about patient's  condition, plan of care, prognosis and all questions were addressed.   Nutrition Status: Nutrition Problem: Moderate Malnutrition Etiology: chronic illness (CHF) Signs/Symptoms: mild fat depletion,moderate muscle depletion Interventions: Ensure Enlive (each supplement provides 350kcal and 20 grams of protein),Magic cup,MVI     Skin Documentation: Pressure Injury 10/13/20 Thigh Anterior;Left;Proximal Unstageable - Full thickness tissue loss in which the base of the injury is covered by slough (yellow, tan, gray, green or brown) and/or eschar (tan, brown or black) in the wound bed. (Active)  10/13/20 0200  Location: Thigh  Location Orientation: Anterior;Left;Proximal  Staging: Unstageable - Full thickness tissue loss in which the base of the injury is covered by slough (yellow, tan, gray, green or brown) and/or eschar (tan, brown or black) in the wound bed.  Wound Description (Comments):   Present on Admission: Yes     Pressure Injury 10/13/20 Knee Anterior;Left Deep Tissue Pressure Injury - Purple or maroon localized area of discolored intact skin or blood-filled blister due to damage of underlying soft tissue from pressure and/or shear. (Active)  10/13/20 0200  Location: Knee  Location Orientation: Anterior;Left  Staging: Deep Tissue  Pressure Injury - Purple or maroon localized area of discolored intact skin or blood-filled blister due to damage of underlying soft tissue from pressure and/or shear.  Wound Description (Comments):   Present on Admission: Yes     Pressure Injury 10/13/20 Flank Left;Upper Unstageable - Full thickness tissue loss in which the base of the injury is covered by slough (yellow, tan, gray, green or brown) and/or eschar (tan, brown or black) in the wound bed. (Active)  10/13/20 0200  Location: Flank  Location Orientation: Left;Upper  Staging: Unstageable - Full thickness tissue loss in which the base of the injury is covered by slough (yellow, tan, gray, green or brown) and/or eschar (tan, brown or black) in the wound bed.  Wound Description (Comments):   Present on Admission: Yes     Pressure Injury 10/13/20 Toe (Comment  which one) Anterior;Right Deep Tissue Pressure Injury - Purple or maroon localized area of discolored intact skin or blood-filled blister due to damage of underlying soft tissue from pressure and/or shear. (Active)  10/13/20 0200  Location: Toe (Comment  which one)  Location Orientation: Anterior;Right  Staging: Deep Tissue Pressure Injury - Purple or maroon localized area of discolored intact skin or blood-filled blister due to damage of underlying soft tissue from pressure and/or shear.  Wound Description (Comments):   Present on Admission: Yes     Pressure Injury 10/13/20 Knee Anterior;Right Unstageable - Full thickness tissue loss in which the base of the injury is covered by slough (yellow, tan, gray, green  or brown) and/or eschar (tan, brown or black) in the wound bed. (Active)  10/13/20 0800  Location: Knee  Location Orientation: Anterior;Right  Staging: Unstageable - Full thickness tissue loss in which the base of the injury is covered by slough (yellow, tan, gray, green or brown) and/or eschar (tan, brown or black) in the wound bed.  Wound Description (Comments):    Present on Admission: Yes     Pressure Injury 10/13/20 Ankle Posterior;Right Deep Tissue Pressure Injury - Purple or maroon localized area of discolored intact skin or blood-filled blister due to damage of underlying soft tissue from pressure and/or shear. (Active)  10/13/20 1900  Location: Ankle  Location Orientation: Posterior;Right  Staging: Deep Tissue Pressure Injury - Purple or maroon localized area of discolored intact skin or blood-filled blister due to damage of underlying soft tissue from pressure and/or shear.  Wound Description (Comments):   Present on Admission: Yes     Pressure Injury 10/13/20 Knee Left;Lateral Deep Tissue Pressure Injury - Purple or maroon localized area of discolored intact skin or blood-filled blister due to damage of underlying soft tissue from pressure and/or shear. (Active)  10/13/20 1900  Location: Knee  Location Orientation: Left;Lateral  Staging: Deep Tissue Pressure Injury - Purple or maroon localized area of discolored intact skin or blood-filled blister due to damage of underlying soft tissue from pressure and/or shear.  Wound Description (Comments):   Present on Admission: Yes     Pressure Injury 10/13/20 Flank Left;Mid Unstageable - Full thickness tissue loss in which the base of the injury is covered by slough (yellow, tan, gray, green or brown) and/or eschar (tan, brown or black) in the wound bed. (Active)  10/13/20 1900  Location: Flank  Location Orientation: Left;Mid  Staging: Unstageable - Full thickness tissue loss in which the base of the injury is covered by slough (yellow, tan, gray, green or brown) and/or eschar (tan, brown or black) in the wound bed.  Wound Description (Comments):   Present on Admission: Yes     Pressure Injury 10/13/20 Elbow Left;Posterior Deep Tissue Pressure Injury - Purple or maroon localized area of discolored intact skin or blood-filled blister due to damage of underlying soft tissue from pressure and/or shear.  (Active)  10/13/20 1900  Location: Elbow  Location Orientation: Left;Posterior  Staging: Deep Tissue Pressure Injury - Purple or maroon localized area of discolored intact skin or blood-filled blister due to damage of underlying soft tissue from pressure and/or shear.  Wound Description (Comments):   Present on Admission: Yes     Pressure Injury 10/13/20 Elbow Posterior;Right Deep Tissue Pressure Injury - Purple or maroon localized area of discolored intact skin or blood-filled blister due to damage of underlying soft tissue from pressure and/or shear. (Active)  10/13/20 1900  Location: Elbow  Location Orientation: Posterior;Right  Staging: Deep Tissue Pressure Injury - Purple or maroon localized area of discolored intact skin or blood-filled blister due to damage of underlying soft tissue from pressure and/or shear.  Wound Description (Comments):   Present on Admission: Yes     Consultants:   Nephrology   Procedures:   HD catheter   Antimicrobials:   Vancomycin and ceftriaxone     Subjective: Patient is calm and not agitated, he remains tachycardic, no nausea or vomiting. Very poor oral intake and has declined po medications,.   Objective: Vitals:   10/20/20 2045 10/20/20 2334 10/21/20 0321 10/21/20 0745  BP: 136/88 117/66  137/67  Pulse: (!) 125 (!) 128 (!) 120 (!) 110  Resp: 16 16 20 20   Temp: 99.5 F (37.5 C) 99.5 F (37.5 C) 99.3 F (37.4 C) 98.4 F (36.9 C)  TempSrc: Axillary Axillary Axillary Oral  SpO2: 95% 96% 96% 99%  Weight:      Height:        Intake/Output Summary (Last 24 hours) at 10/21/2020 1007 Last data filed at 10/21/2020 0630 Gross per 24 hour  Intake --  Output 400 ml  Net -400 ml   Filed Weights   10/16/20 0500 10/18/20 0546 10/19/20 0525  Weight: 78.1 kg 79.4 kg 76.2 kg    Examination:   General: deconditioned and ill looking appearing  Neurology: Awake and alert, responds to simple questions, continue to be confused but not  agitated.  E ENT: mild pallor, no icterus, oral mucosa moist Cardiovascular: No JVD. S1-S2 present, rhythmic, tachycardic with no gallops, rubs, or murmurs. No lower extremity edema. Pulmonary: positive breath sounds bilaterally, adequate air movement, no wheezing, rhonchi or rales. Gastrointestinal. Abdomen soft and non tender Skin. No rashes Musculoskeletal: no joint deformities     Data Reviewed: I have personally reviewed following labs and imaging studies  CBC: Recent Labs  Lab 10/15/20 0322 10/16/20 0334 10/17/20 0336 10/20/20 0134 10/21/20 0254  WBC 18.4* 21.3* 17.7* 18.7* 15.0*  NEUTROABS  --   --   --  16.1* 12.7*  HGB 9.3* 9.3* 9.3* 10.0* 8.7*  HCT 29.9* 29.8* 28.5* 30.3* 26.5*  MCV 107.2* 108.8* 102.2* 102.0* 102.3*  PLT 244 250 264 547* 128*   Basic Metabolic Panel: Recent Labs  Lab 10/15/20 0322 10/15/20 1452 10/16/20 0334 10/16/20 1422 10/17/20 0336 10/18/20 0607 10/20/20 0112 10/21/20 0254  NA 155* 154* 153* 144 141 143 144 140  K 4.2 4.0 3.8 3.8 3.5 3.5 3.5 3.2*  CL 124* 123* 121* 114* 110 110 110 113*  CO2 23 23 25 22 23  21* 22 21*  GLUCOSE 116* 199* 140* 135* 123* 80 101* 122*  BUN 95* 84* 67* 54* 43* 26* 24* 24*  CREATININE 4.60* 3.88* 3.04* 2.40* 2.00* 1.46* 1.11 1.04  CALCIUM 8.5* 8.9 8.9 8.6* 8.7* 9.3 9.7 9.2  MG 2.2  --  1.8  --  1.4*  --   --   --   PHOS 3.8  3.8 3.8 3.5 2.9 2.4*  --   --   --    GFR: Estimated Creatinine Clearance: 67 mL/min (by C-G formula based on SCr of 1.04 mg/dL). Liver Function Tests: Recent Labs  Lab 10/14/20 1435 10/15/20 0322 10/15/20 1452 10/16/20 0334 10/16/20 1422  ALBUMIN 1.4* 1.4* 1.5* 1.4* 1.4*   No results for input(s): LIPASE, AMYLASE in the last 168 hours. No results for input(s): AMMONIA in the last 168 hours. Coagulation Profile: No results for input(s): INR, PROTIME in the last 168 hours. Cardiac Enzymes: Recent Labs  Lab 10/15/20 0322 10/17/20 0336  CKTOTAL 1,430* 611*   BNP (last  3 results) No results for input(s): PROBNP in the last 8760 hours. HbA1C: No results for input(s): HGBA1C in the last 72 hours. CBG: Recent Labs  Lab 10/19/20 2220 10/20/20 0747 10/20/20 1124 10/20/20 2145 10/21/20 0743  GLUCAP 99 104* 147* 119* 107*   Lipid Profile: No results for input(s): CHOL, HDL, LDLCALC, TRIG, CHOLHDL, LDLDIRECT in the last 72 hours. Thyroid Function Tests: No results for input(s): TSH, T4TOTAL, FREET4, T3FREE, THYROIDAB in the last 72 hours. Anemia Panel: No results for input(s): VITAMINB12, FOLATE, FERRITIN, TIBC, IRON, RETICCTPCT in the last 72 hours.  Radiology Studies: I have reviewed all of the imaging during this hospital visit personally     Scheduled Meds: . clonazePAM  0.5 mg Oral BID  . collagenase   Topical Daily  . enoxaparin (LOVENOX) injection  40 mg Subcutaneous Q24H  . feeding supplement  237 mL Oral TID WC & HS  . mouth rinse  15 mL Mouth Rinse q12n4p  . metoprolol tartrate  5 mg Intravenous Once  . metoprolol tartrate  25 mg Oral BID  . multivitamin with minerals  1 tablet Oral Daily  . sodium chloride flush  10-40 mL Intracatheter Q12H  . thiamine  250 mg Oral BID   Continuous Infusions: . sodium chloride Stopped (10/14/20 0859)  . cefTRIAXone (ROCEPHIN)  IV 2 g (10/21/20 0920)  . dextrose 5 % and 0.45% NaCl 100 mL/hr at 10/21/20 0058  . vancomycin       LOS: 9 days        Ester Hilley Gerome Apley, MD

## 2020-10-21 NOTE — Evaluation (Signed)
Clinical/Bedside Swallow Evaluation Patient Details  Name: Raymond Stone MRN: [id number] Date of Birth: 1950-11-05  Today's Date: 10/21/2020 Time: SLP Start Time (ACUTE ONLY): 1310 SLP Stop Time (ACUTE ONLY): 1330 SLP Time Calculation (min) (ACUTE ONLY): 20 min  Past Medical History:  Past Medical History:  Diagnosis Date  . Anemia   . Eczema   . Fluid collection (edema) in the arms, legs, hands and feet   . Hypertension    Past Surgical History:  Past Surgical History:  Procedure Laterality Date  . COLONOSCOPY    . FINGER SURGERY Right 2013  . I & D EXTREMITY Right 09/13/2012   Procedure: IRRIGATION AND DEBRIDEMENT EXTREMITY  RIGHT MIDDLE FINGER WITH REVISION AMPUTATION AND SKIN GRAFTING.;  Surgeon: Roseanne Kaufman, MD;  Location: [place];  Service: Orthopedics;  Laterality: Right;   HPI:  70 yo man with hx of HTN, lives alone, found down after not being seen for five days and admitted 4/2. Dx acute metabolic encephalopathy, acute kidney injury, hypovolemic shock, rhabdomylosis. BSE completed on 4/8 and after diet check by SLP, patient was discharged from speech services because of tolerating regular solids, thin liquids. BSE reordered on 4/11 as nursing observing patient to cough frequently with even smal sips of water.   Assessment / Plan / Recommendation Clinical Impression  Patient exhibits a mild oropharyngeal dysphagia with some impact on positioning as he was not tolerating sitting completly upright and was semi-reclined. He exhibited only one instance of mild cough when drinking large consecutive straw sips of thin liquids but otherwise appeared to tolerate thin liquids well. He refused puree solids and all solids on meal tray but did accept a saltine cracker. He exhibited prolonged mastication, left side anterior spillage and mild oral residuals post swallow which appeared to clear with sips of water. Recommend downgrading solids to Dys 2 and continuing with thin liquids. SLP Visit  Diagnosis: Dysphagia, unspecified (R13.10)    Aspiration Risk  Mild aspiration risk    Diet Recommendation Dysphagia 2 (Fine chop);Thin liquid   Liquid Administration via: Cup;Straw Medication Administration: Whole meds with liquid Supervision: Full supervision/cueing for compensatory strategies;Staff to assist with self feeding Compensations: Slow rate;Small sips/bites    Other  Recommendations Oral Care Recommendations: Oral care BID   Follow up Recommendations None      Frequency and Duration min 1 x/week  1 week       Prognosis Prognosis for Safe Diet Advancement: Fair Barriers to Reach Goals: Other (Comment) Barriers/Prognosis Comment: Patient has been frequently refusing PO's      Swallow Study   General HPI: 70 yo man with hx of HTN, lives alone, found down after not being seen for five days and admitted 4/2. Dx acute metabolic encephalopathy, acute kidney injury, hypovolemic shock, rhabdomylosis. BSE completed on 4/8 and after diet check by SLP, patient was discharged from speech services because of tolerating regular solids, thin liquids. BSE reordered on 4/11 as nursing observing patient to cough frequently with even smal sips of water. Type of Study: Bedside Swallow Evaluation Previous Swallow Assessment: 10/18/20 Diet Prior to this Study: Regular;Thin liquids Temperature Spikes Noted: No Respiratory Status: Nasal cannula History of Recent Intubation: No Behavior/Cognition: Alert;Requires cueing;Agitated Oral Cavity Assessment: Other (comment) (patient did not allow for adequate assessment) Oral Care Completed by SLP: No (patient refused) Oral Cavity - Dentition: Poor condition;Missing dentition Self-Feeding Abilities: Total assist Patient Positioning: Partially reclined Baseline Vocal Quality: Low vocal intensity Volitional Cough: Cognitively unable to elicit Volitional Swallow: Unable to elicit  Oral/Motor/Sensory Function Overall Oral Motor/Sensory  Function: Other (comment) (patient not participating in full oral motor exam but appeared adequate for PO intake)   Ice Chips     Thin Liquid Thin Liquid: Impaired Presentation: Straw Pharyngeal  Phase Impairments: Other (comments) Other Comments: one instances of mild cough when patient drank several large consecutive sips of water    Nectar Thick     Honey Thick     Puree Puree: Not tested   Solid     Solid: Impaired Oral Phase Impairments: Impaired mastication Oral Phase Functional Implications: Left anterior spillage;Prolonged oral transit;Oral residue      Sonia Baller, MA, CCC-SLP Speech Therapy [facility]

## 2020-10-21 NOTE — Care Management Important Message (Signed)
Important Message  Patient Details  Name: Raymond Stone MRN: [id number] Date of Birth: Jul 31, 1950   Medicare Important Message Given:  Yes     Shelda Altes 10/21/2020, 10:14 AM

## 2020-10-21 NOTE — Progress Notes (Signed)
Physical Therapy Treatment Patient Details Name: Raymond Stone MRN: [id number] DOB: October 12, 1950 Today's Date: 10/21/2020    History of Present Illness Pt is a 70 y/o male admitted 10/12/20 after being found down for unkown length of time at home. Workup for several pressure injuries, hypovolemic shock complicated by acute kidney injury. Underwent CRRT 4/3. PMH includes HTN, alcohol dependence, malnutrition.   PT Comments    Pt initially lethargic, becoming more alert with mobility during session, but still with disorientation and decreased command following. Pt frustrated by being woken up and being asked questions, but ultimately agreeable to bed-level activity. BUE/BLE PROM/AAROM performed and pt repositioned to encourage neutral trunk/cervical posture. Pt with increased muscle activation noted, especially in BLEs, compared to prior evaluation. Pt will require maximove lift for OOB transfers. Continue to recommend SNF-level therapies to maximize functional mobility.  SpO2 100% on RA HR 112   Follow Up Recommendations  SNF;Supervision/Assistance - 24 hour     Equipment Recommendations  Wheelchair (measurements PT);Wheelchair cushion (measurements PT);Hospital bed;Hoyer lift   Recommendations for Other Services       Precautions / Restrictions Precautions Precautions: Fall;Other (comment) Precaution Comments: Watch HR, multiple wounds; swollen/weeping(?) extremities Required Braces or Orthoses: Other Brace Other Brace: Bilateral PRAFOs Restrictions Weight Bearing Restrictions: No    Mobility  Bed Mobility Overal bed mobility: Needs Assistance             General bed mobility comments: Noted minimal initiation of trunk/core for repositioning in bed; pt requiring totalA+2 to scoot up, totalA to reposition trunk to midline; bed left in chair position    Transfers                 General transfer comment: Will require maximove lift OOB  Ambulation/Gait                  Stairs             Wheelchair Mobility    Modified Rankin (Stroke Patients Only)       Balance                                            Cognition Arousal/Alertness: Lethargic;Awake/alert Behavior During Therapy: Flat affect   Area of Impairment: Orientation;Attention;Memory;Following commands;Safety/judgement;Awareness;Problem solving                 Orientation Level: Disoriented to;Place;Situation Current Attention Level: Sustained;Selective Memory: Decreased short-term memory Following Commands: Follows one step commands with increased time;Follows one step commands inconsistently Safety/Judgement: Decreased awareness of safety;Decreased awareness of deficits Awareness: Intellectual Problem Solving: Decreased initiation;Requires verbal cues General Comments: Becoming more alert during session, still keeping eyes clsoed at times but answering questions/conversation. Pt seems frustrated by multiple questions and being woken up, but still ultimately agreeable for session; when asked about getting out of bed, pt states, "Maybe next time... one day at a time..." Seems to think he is "at one of my places" despite reorientation to fact he is in hospital; able to state April 2022, but pt adamant it is saturday despite reorientation that it's monday      Exercises Other Exercises Other Exercises: PROM BUE shoulder flex/abd, elbow flex/ext, finger ext; PROM BLE hip abd/add Other Exercises: AROM bilateral ankle pumps Other Exercises: AAROM BLE knee/hip flex and ext - pt able to push into some resistance when extending BLEs back to flat on bed;  noted minimal quad/hip flexor activation Other Exercises: Pt with very minimal AROM cervical rotation with resting L-side rotation - repositioned trunk and padded neck to encourage midline/neutral resting position    General Comments        Pertinent Vitals/Pain Pain Assessment: Faces Faces Pain  Scale: Hurts even more Pain Location: Generalized, especially bilateral hands with PROM, neck with PROM, BLEs with initial PROM Pain Descriptors / Indicators: Grimacing;Guarding Pain Intervention(s): Monitored during session;Limited activity within patient's tolerance;Repositioned    Home Living                      Prior Function            PT Goals (current goals can now be found in the care plan section) Progress towards PT goals: Progressing toward goals (slowly)    Frequency    Min 2X/week      PT Plan Current plan remains appropriate    Co-evaluation              AM-PAC PT "6 Clicks" Mobility   Outcome Measure  Help needed turning from your back to your side while in a flat bed without using bedrails?: Total Help needed moving from lying on your back to sitting on the side of a flat bed without using bedrails?: Total Help needed moving to and from a bed to a chair (including a wheelchair)?: Total Help needed standing up from a chair using your arms (e.g., wheelchair or bedside chair)?: Total Help needed to walk in hospital room?: Total Help needed climbing 3-5 steps with a railing? : Total 6 Click Score: 6    End of Session   Activity Tolerance: Patient tolerated treatment well;Other (comment) (limited by cognition/frustration/agitation with mobility, self-limiting) Patient left: in bed;with call bell/phone within reach Nurse Communication: Mobility status;Need for lift equipment PT Visit Diagnosis: History of falling (Z91.81);Muscle weakness (generalized) (M62.81);Adult, failure to thrive (R62.7)     Time: 0956-1020 PT Time Calculation (min) (ACUTE ONLY): 24 min  Charges:  $Therapeutic Activity: 8-22 mins                     Mabeline Caras, PT, DPT Acute Rehabilitation Services  Pager [phone] Office [place] 10/21/2020, 10:36 AM

## 2020-10-21 NOTE — Progress Notes (Signed)
Pt is refusing to eat more than a few bites of magic pudding and refusing all meds, MD aware. Judson Roch Landyn Lorincz, RN 10/21/20

## 2020-10-21 NOTE — Progress Notes (Signed)
Occupational Therapy Treatment Patient Details Name: Raymond Stone MRN: [id number] DOB: [birth year] Today's Date: 10/21/2020    History of present illness Pt is a 70 y/o male admitted 10/12/20 after being found down for unkown length of time at home. Workup for several pressure injuries, hypovolemic shock complicated by acute kidney injury. Underwent CRRT 4/3. PMH includes HTN, alcohol dependence, malnutrition.   OT comments  Pt more alert this session, confusion remains initially does not want to participate in therapy but lacks initiation, once you start working with him he vocalizes that it does provide relief and does feel good to be completing exercises/activities. Pt with improved strength and ROM in BUE and BLE - However Pt remains total care for ADL as he is unable to bring hand to face. Oral care completed with soft sponge dentation very poor with loose teeth.  Pt also participated in [place] for BUE and [place] very painful especially with hand/digit ROM. OT will continue to follow acutely. Pt agreeable to maximove OOB next session.     Follow Up Recommendations  SNF;Supervision/Assistance - 24 hour    Equipment Recommendations  Other (comment) (defer to next venue)    Recommendations for Other Services      Precautions / Restrictions Precautions Precautions: Fall;Other (comment) Precaution Comments: Watch HR, multiple wounds; swollen/weeping(?) extremities Required Braces or Orthoses: Other Brace Other Brace: Bilateral PRAFOs Restrictions Weight Bearing Restrictions: No       Mobility Bed Mobility Overal bed mobility: Needs Assistance Bed Mobility: Rolling Rolling: Total assist;+2 for physical assistance         General bed mobility comments: Noted minimal initiation of trunk/core for repositioning in bed; pt requiring totalA+2 to scoot up, totalA to reposition trunk to midline; bed left in chair position    Transfers                 General transfer  comment: Will require maximove lift OOB - Pt agreeable next session    Balance                                           ADL either performed or assessed with clinical judgement   ADL Overall ADL's : Needs assistance/impaired     Grooming: Oral care;Wash/dry face;Total assistance;Bed level Grooming Details (indicate cue type and reason): Pt unable to bring hands to his face for grooming                             Functional mobility during ADLs: Total assistance;+2 for physical assistance;+2 for safety/equipment General ADL Comments: Pt with improved cognition(althogh still confused)/eyes open/ROM of BUE and BLE, stating that next session he is willing to attempt OOB.     Vision       Perception     Praxis      Cognition Arousal/Alertness: Lethargic;Awake/alert Behavior During Therapy: Flat affect Overall Cognitive Status: No family/caregiver present to determine baseline cognitive functioning Area of Impairment: Orientation;Attention;Memory;Following commands;Safety/judgement;Awareness;Problem solving                 Orientation Level: Disoriented to;Place;Situation Current Attention Level: Sustained;Selective Memory: Decreased short-term memory Following Commands: Follows one step commands with increased time;Follows one step commands inconsistently Safety/Judgement: Decreased awareness of safety;Decreased awareness of deficits Awareness: Intellectual Problem Solving: Decreased initiation;Requires verbal cues General Comments: Becoming more alert during session,  still keeping eyes clsoed at times but answering questions/conversation. Pt seems frustrated by multiple questions and being woken up, but still ultimately agreeable for session; when asked about getting out of bed, pt states, "Maybe next time... one day at a time..." Seems to think he is "at one of my places" despite reorientation to fact he is in hospital; able to state April  2022, but pt adamant it is saturday despite reorientation that it's monday        Exercises Exercises: Other exercises Other Exercises Other Exercises: PROM BUE shoulder flex/abd, elbow flex/ext, finger ext; PROM BLE hip abd/add Other Exercises: AROM bilateral ankle pumps Other Exercises: AAROM BLE knee/hip flex and ext - pt able to push into some resistance when extending BLEs back to flat on bed; noted minimal quad/hip flexor activation Other Exercises: Pt with very minimal AROM cervical rotation with resting L-side rotation - repositioned trunk and padded neck to encourage midline/neutral resting position   Shoulder Instructions       General Comments      Pertinent Vitals/ Pain       Pain Assessment: Faces Faces Pain Scale: Hurts even more Pain Location: Generalized, especially bilateral hands with PROM, neck with PROM, BLEs with initial PROM Pain Descriptors / Indicators: Grimacing;Guarding Pain Intervention(s): Monitored during session;Limited activity within patient's tolerance;Repositioned;Other (comment) (PROM and elevation of extremities at EOS)  Home Living                                          Prior Functioning/Environment              Frequency  Min 2X/week        Progress Toward Goals  OT Goals(current goals can now be found in the care plan section)  Progress towards OT goals: Progressing toward goals (limited)  Acute Rehab OT Goals Patient Stated Goal: To go back home OT Goal Formulation: With patient Time For Goal Achievement: 11/01/20 Potential to Achieve Goals: Good  Plan Discharge plan remains appropriate;Frequency remains appropriate    Co-evaluation                 AM-PAC OT "6 Clicks" Daily Activity     Outcome Measure   Help from another person eating meals?: Total Help from another person taking care of personal grooming?: Total Help from another person toileting, which includes using toliet, bedpan, or  urinal?: Total Help from another person bathing (including washing, rinsing, drying)?: Total Help from another person to put on and taking off regular upper body clothing?: Total Help from another person to put on and taking off regular lower body clothing?: Total 6 Click Score: 6    End of Session    OT Visit Diagnosis: Unsteadiness on feet (R26.81);Muscle weakness (generalized) (M62.81);History of falling (Z91.81)   Activity Tolerance Patient tolerated treatment well;Other (comment) (Pt stating "one day at a time")   Patient Left in bed;with call bell/phone within reach;with bed alarm set   Nurse Communication Mobility status;Precautions        Time: 0626-9485 OT Time Calculation (min): 24 min  Charges: OT General Charges $OT Visit: 1 Visit OT Treatments $Self Care/Home Management : 8-22 mins  Jesse Sans OTR/L Acute Rehabilitation Services Pager: [phone] Office: [place] 10/21/2020, 12:12 PM

## 2020-10-21 NOTE — Consult Note (Signed)
[place] Nurse wound follow up Patient receiving care in [place]. Patient admitted with multiple wounds, primarily left side of body, due to prolonged time "down".  Continue previously ordered wound treatment. [place] nurse will not follow at this time.  Please re-consult the [facility] team if needed.  Val Riles, RN, MSN, CWOCN, CNS-BC, pager [phone]

## 2020-10-22 DIAGNOSIS — N179 Acute kidney failure, unspecified: Secondary | ICD-10-CM | POA: Diagnosis not present

## 2020-10-22 DIAGNOSIS — G9341 Metabolic encephalopathy: Secondary | ICD-10-CM | POA: Diagnosis not present

## 2020-10-22 DIAGNOSIS — R571 Hypovolemic shock: Secondary | ICD-10-CM | POA: Diagnosis not present

## 2020-10-22 DIAGNOSIS — F1020 Alcohol dependence, uncomplicated: Secondary | ICD-10-CM | POA: Diagnosis not present

## 2020-10-22 LAB — CBC WITH DIFFERENTIAL/PLATELET
Abs Immature Granulocytes: 0.16 10*3/uL — ABNORMAL HIGH (ref 0.00–0.07)
Basophils Absolute: 0 10*3/uL (ref 0.0–0.1)
Basophils Relative: 0 %
Eosinophils Absolute: 0.3 10*3/uL (ref 0.0–0.5)
Eosinophils Relative: 2 %
HCT: 25.1 % — ABNORMAL LOW (ref 39.0–52.0)
Hemoglobin: 8.2 g/dL — ABNORMAL LOW (ref 13.0–17.0)
Immature Granulocytes: 1 %
Lymphocytes Relative: 8 %
Lymphs Abs: 1.1 10*3/uL (ref 0.7–4.0)
MCH: 33.5 pg (ref 26.0–34.0)
MCHC: 32.7 g/dL (ref 30.0–36.0)
MCV: 102.4 fL — ABNORMAL HIGH (ref 80.0–100.0)
Monocytes Absolute: 0.9 10*3/uL (ref 0.1–1.0)
Monocytes Relative: 7 %
Neutro Abs: 10.9 10*3/uL — ABNORMAL HIGH (ref 1.7–7.7)
Neutrophils Relative %: 82 %
Platelets: 618 10*3/uL — ABNORMAL HIGH (ref 150–400)
RBC: 2.45 MIL/uL — ABNORMAL LOW (ref 4.22–5.81)
RDW: 13.6 % (ref 11.5–15.5)
WBC: 13.3 10*3/uL — ABNORMAL HIGH (ref 4.0–10.5)
nRBC: 0 % (ref 0.0–0.2)

## 2020-10-22 LAB — BASIC METABOLIC PANEL
Anion gap: 11 (ref 5–15)
BUN: 18 mg/dL (ref 8–23)
CO2: 20 mmol/L — ABNORMAL LOW (ref 22–32)
Calcium: 9.5 mg/dL (ref 8.9–10.3)
Chloride: 108 mmol/L (ref 98–111)
Creatinine, Ser: 0.93 mg/dL (ref 0.61–1.24)
GFR, Estimated: >60 mL/min (ref >60)
Glucose, Bld: 94 mg/dL (ref 70–99)
Potassium: 3.6 mmol/L (ref 3.5–5.1)
Sodium: 139 mmol/L (ref 135–145)

## 2020-10-22 LAB — GLUCOSE, CAPILLARY
Glucose-Capillary: 105 mg/dL — ABNORMAL HIGH (ref 70–99)
Glucose-Capillary: 104 mg/dL — ABNORMAL HIGH (ref 70–99)
Glucose-Capillary: 100 mg/dL — ABNORMAL HIGH (ref 70–99)
Glucose-Capillary: 101 mg/dL — ABNORMAL HIGH (ref 70–99)

## 2020-10-22 LAB — MAGNESIUM: Magnesium: 1.5 mg/dL — ABNORMAL LOW (ref 1.7–2.4)

## 2020-10-22 LAB — VANCOMYCIN, PEAK: Vancomycin Pk: 42 ug/mL — ABNORMAL HIGH (ref 30–40)

## 2020-10-22 MED ORDER — LIP MEDEX EX OINT
TOPICAL_OINTMENT | CUTANEOUS | Status: DC | PRN
  Administered 2020-10-27: 1 via TOPICAL
  Filled 2020-10-22: qty 7

## 2020-10-22 MED ORDER — ORAL CARE MOUTH RINSE
15.0000 mL | Freq: Two times a day (BID) | OROMUCOSAL | Status: DC
  Administered 2020-10-22 – 2020-11-07 (×26): 15 mL via OROMUCOSAL

## 2020-10-22 MED ORDER — POLYVINYL ALCOHOL 1.4 % OP SOLN
1.0000 [drp] | OPHTHALMIC | Status: DC | PRN
  Administered 2020-10-22 – 2020-10-27 (×2): 1 [drp] via OPHTHALMIC
  Filled 2020-10-22: qty 15

## 2020-10-22 MED ORDER — MAGNESIUM SULFATE 4 GM/100ML IV SOLN
4.0000 g | Freq: Once | INTRAVENOUS | Status: AC
  Administered 2020-10-22: 4 g via INTRAVENOUS

## 2020-10-22 NOTE — Progress Notes (Signed)
Nutrition Follow-up  DOCUMENTATION CODES:   Non-severe (moderate) malnutrition in context of chronic illness  INTERVENTION:   Once pt becomes more medically stable, Consider enteral nutrition if po intake does not improve, recommend Osmolite 1.5 cal formula at 25 ml/hr and increase by 10 ml every 4 hours to goal rate of 65 ml/hr.  Recommend 45 ml Prosource TF BID per tube.   Tube feeding regimen to provide 2420 kcal, 120 grams of protein, and 1186 ml free water.   For now, Provide Ensure Enlive po QID, each supplement provides 350 kcal and 20 grams of protein.  Continue Magic cup TID with meals, each supplement provides 290 kcal and 9 grams of protein  Continue MVI with Minerals.  NUTRITION DIAGNOSIS:   Moderate Malnutrition related to chronic illness (CHF) as evidenced by mild fat depletion,moderate muscle depletion; ongoing  GOAL:   Patient will meet greater than or equal to 90% of their needs; not met  MONITOR:   PO intake,Supplement acceptance,Weight trends,Labs,I & O's,Diet advancement,Skin  REASON FOR ASSESSMENT:   Malnutrition Screening Tool    ASSESSMENT:   Patient with PMH significant for HTN, peripheral edema, CHF, and alcohol use disorder. Presents this admission with suspected uremic encephalopathy and AKI.   4/3- start CRRT 4/4- stop CRRT due to pressure alarming 4/6 - HD cath removed  Meal completion 0-25%. PO intake continues to be poor. Pt with no po intake today. Per RN per additionally refusing medications PO as well. Pt refusing his Ensure supplements. Discussed consideration of enteral nutrition initiation on pt with MD. MD reports pt will likely need enteral nutrition as pt with varied mentation and ongoing poor po, however pt currently with persistent tachycardia and fever. Will plan for pt to become more medically stable prior to enteral nutrition initiation. MD reports pt may likely need PEG rather than Cortrak. Tube feeding recommendations  stated above, once able to initiate enteral nutrition.   Labs and medications reviewed.   Diet Order:   Diet Order            DIET DYS 2 Room service appropriate? Yes; Fluid consistency: Thin  Diet effective now                 EDUCATION NEEDS:   Not appropriate for education at this time  Skin:  Skin Assessment: Skin Integrity Issues: Skin Integrity Issues:: Unstageable,DTI DTI: L elbow, R toe, bilateral ankles, bilateral knees Stage I: N/A Stage II: N/A Unstageable: L thigh, L flank Other: skin tear scrotum  Last BM:  4/8  Height:   Ht Readings from Last 1 Encounters:  10/13/20 _0  (1.753 m)    Weight:   Wt Readings from Last 1 Encounters:  10/22/20 78.9 kg   BMI:  Body mass index is 25.7 kg/m.  Estimated Nutritional Needs:   Kcal:  2300-2500 kcal  Protein:  115-130 grams  Fluid:  >/= 2 L/day  Corrin Parker, MS, RD, LDN RD pager number/after hours weekend pager number on [facility].

## 2020-10-22 NOTE — TOC Progression Note (Signed)
Transition of Care [facility]) - Progression Note    Patient Details  Name: Raymond Stone MRN: [id number] Date of Birth: 1950/08/12  [facility] CM/SW [place], [place] Phone Number: 10/22/2020, 10:14 AM  Clinical Narrative:     CSW spoke with patients daughter [name] and provided her with patients SNF bed offers. [name] chose SNF placement at [facility]. CSW called Kitty with [facility] who confirmed she can accept patient for SNF placement. CSW will start insurance authorization closer to patient being medically ready. CSW will continue to follow and assist with discharge planning needs.    Expected Discharge Plan: [place] Barriers to Discharge: Continued Medical Work up  Expected Discharge Plan and Services Expected Discharge Plan: [place] In-house Referral: Clinical Social Work     Living arrangements for the past 2 months: Single Family Home                                       Social Determinants of Health (SDOH) Interventions    Readmission Risk Interventions No flowsheet data found.

## 2020-10-22 NOTE — Progress Notes (Signed)
Dr. Cathlean Sauer made aware that Mr. Raymond Stone is very lethargic and sleepy today. We will continue to monitor the patient.

## 2020-10-22 NOTE — Progress Notes (Signed)
PROGRESS NOTE    Maikol Grassia  PNT:[id number] DOB: 07/04/1951 DOA: 10/12/2020 PCP: [name], NP    Brief Narrative:  Mr. Bille admitted to the hospital with working diagnosis hypovolemic shock complicated by acute kidney injury/ATN.  70 year old male past medical history of hypertension who was found down, apparently he was not on scene for about 5 days. When EMS arrived he was severely hypotensive, his temperature was 100 F he received intravenous fluids and transported to the emergency room. On his initial physical examination his blood pressure was 111/66, heart rate 109, temperature 37.8 C, respiratory rate 29, oxygen saturation 95% he was confused and disoriented, dry mucous membranes, lungs were clear to auscultation bilaterally, heart S1-S2, present, rhythmic, no gallops rubs or murmurs, his abdomen was soft, no lower extremity edema. He was neurologically nonfocal.  Sodium 172, potassium 7.3, chloride>130,bicarb 15, glucose 156, BUN 211, creatinine 11.2, AST 118, ALT 42, total bilirubin is 1.5, troponin I 489-395, lactic acid 5.3. anion gap 49. ABG, pH 7.37, PCO2 26, PO2 96, bicarb 15 White cell count 18.7, hemoglobin 11.1, hematocrit 37.6, platelets 346. SARS COVID-19 negative. Urinalysis specific gravity 1.026, negative protein, 21-50 white cells, 0-5 red cells. Toxicology negative. Head CT negative for acute changes.  Chest radiograph no infiltrates. EKG had 19 bpm, normal axis, normal intervals, sinus rhythm, no ST segment or T wave changes.  Patient was diagnosed with shock, he received intravenous fluids and broad-spectrum antibiotic therapy. Patient underwent CRRT04/03with further improvement of his kidney function.  Peak CK 2,415 mild rhabdomyolysis likely not the culprit of renal failure.  Transferred to [facility]04/07.  Patient very debilitated, pending PT and OT evaluation, likely will need SNF for short term rehab.  Persistent  tachycardia, now with fevers and worsening wbc, urine analysis negative for UTI and chest film with no infiltrates.  Start antibiotics for complex wounds infection with sepsis., 04/10.   Patient with persistent fever despite broad spectrum antibiotic therapy, getting CT abdomen and pelvis.   Assessment & Plan:   Principal Problem:   Hypovolemic shock (HCC) Active Problems:   Essential hypertension   AKI (acute kidney injury) ([place])   Alcohol dependence (HCC)   Pressure injury of skin   Malnutrition of moderate degree   Hypernatremia   Hyperkalemia   Acute metabolic encephalopathy   ATN (acute tubular necrosis) ([place])   1. Hypovolemic shock with AKI/ ATN, uremic encephalopathy, hyperkalemia/ hypokalemia, hypernatremia, anion gap metabolic acidosis.Hypomagnesemia. His renal function now has been stable with serum cr at 0,93, K at 3,6 and bicarbonate at 20. Mg 1,5  Continue hydration with D5 0,45% saline at 75 ml/hr, patient with very poor oral intake.  Mag correction with IV mag sulfate.   2. Complex wounds with NEW infection and sepsis (not present on admission). Patient with complex wounds, left hemithorax and left thigh,   With antibiotic therapy IV vancomycin and ceftriaxone wbc has been trending down, but he continue to be febrile. HR 110 to 120's.  Fever spikes now more frequent. Blood cultures continue with no growth.   Further work up with CT abdomen and pelvis, looking for sources of possible deep infection.   2. HTN.continue blood pressure control with metoprolol. Systolic blood pressure 086 to 120's mmHg.   3. Etho abuse/ metabolic encephalopathy. Patient drinks alcohol at home, On low dose bid clonazepam and as needed lorazepam.    Continue with multivitamins and high dose thiamine to IV. Patient has been declining po intake along with po medications.  Aspiration precautions,  dysphagia 2 diet.   4.Moderatecalorie protein malnutrition.Continue with  nutritional supplements, multivitamins, and thiamine. Patient with very poor oral intake.   5. Aspiration pneumonitis (present on admission).Ruled out for bacterial pneumonia.  Continue with aspiration precautions.  Patient continue to be at high risk for worsening sepsis.   Status is: Inpatient  Remains inpatient appropriate because:IV treatments appropriate due to intensity of illness or inability to take PO   Dispo: The patient is from: Home              Anticipated d/c is to: SNF              Patient currently is not medically stable to d/c.   Difficult to place patient No   DVT prophylaxis: Enoxaparin   Code Status:   full  Family Communication:  No family at the bedside      Nutrition Status: Nutrition Problem: Moderate Malnutrition Etiology: chronic illness (CHF) Signs/Symptoms: mild fat depletion,moderate muscle depletion Interventions: Ensure Enlive (each supplement provides 350kcal and 20 grams of protein),Magic cup,MVI     Skin Documentation: Pressure Injury 10/13/20 Thigh Anterior;Left;Proximal Unstageable - Full thickness tissue loss in which the base of the injury is covered by slough (yellow, tan, gray, green or brown) and/or eschar (tan, brown or black) in the wound bed. (Active)  10/13/20 0200  Location: Thigh  Location Orientation: Anterior;Left;Proximal  Staging: Unstageable - Full thickness tissue loss in which the base of the injury is covered by slough (yellow, tan, gray, green or brown) and/or eschar (tan, brown or black) in the wound bed.  Wound Description (Comments):   Present on Admission: Yes     Pressure Injury 10/13/20 Knee Anterior;Left Deep Tissue Pressure Injury - Purple or maroon localized area of discolored intact skin or blood-filled blister due to damage of underlying soft tissue from pressure and/or shear. (Active)  10/13/20 0200  Location: Knee  Location Orientation: Anterior;Left  Staging: Deep Tissue Pressure Injury - Purple  or maroon localized area of discolored intact skin or blood-filled blister due to damage of underlying soft tissue from pressure and/or shear.  Wound Description (Comments):   Present on Admission: Yes     Pressure Injury 10/13/20 Flank Left;Upper Unstageable - Full thickness tissue loss in which the base of the injury is covered by slough (yellow, tan, gray, green or brown) and/or eschar (tan, brown or black) in the wound bed. (Active)  10/13/20 0200  Location: Flank  Location Orientation: Left;Upper  Staging: Unstageable - Full thickness tissue loss in which the base of the injury is covered by slough (yellow, tan, gray, green or brown) and/or eschar (tan, brown or black) in the wound bed.  Wound Description (Comments):   Present on Admission: Yes     Pressure Injury 10/13/20 Toe (Comment  which one) Anterior;Right Deep Tissue Pressure Injury - Purple or maroon localized area of discolored intact skin or blood-filled blister due to damage of underlying soft tissue from pressure and/or shear. (Active)  10/13/20 0200  Location: Toe (Comment  which one)  Location Orientation: Anterior;Right  Staging: Deep Tissue Pressure Injury - Purple or maroon localized area of discolored intact skin or blood-filled blister due to damage of underlying soft tissue from pressure and/or shear.  Wound Description (Comments):   Present on Admission: Yes     Pressure Injury 10/13/20 Knee Anterior;Right Unstageable - Full thickness tissue loss in which the base of the injury is covered by slough (yellow, tan, gray, green or brown) and/or eschar (tan, brown or  black) in the wound bed. (Active)  10/13/20 0800  Location: Knee  Location Orientation: Anterior;Right  Staging: Unstageable - Full thickness tissue loss in which the base of the injury is covered by slough (yellow, tan, gray, green or brown) and/or eschar (tan, brown or black) in the wound bed.  Wound Description (Comments):   Present on Admission: Yes      Pressure Injury 10/13/20 Ankle Posterior;Right Deep Tissue Pressure Injury - Purple or maroon localized area of discolored intact skin or blood-filled blister due to damage of underlying soft tissue from pressure and/or shear. (Active)  10/13/20 1900  Location: Ankle  Location Orientation: Posterior;Right  Staging: Deep Tissue Pressure Injury - Purple or maroon localized area of discolored intact skin or blood-filled blister due to damage of underlying soft tissue from pressure and/or shear.  Wound Description (Comments):   Present on Admission: Yes     Pressure Injury 10/13/20 Knee Left;Lateral Deep Tissue Pressure Injury - Purple or maroon localized area of discolored intact skin or blood-filled blister due to damage of underlying soft tissue from pressure and/or shear. (Active)  10/13/20 1900  Location: Knee  Location Orientation: Left;Lateral  Staging: Deep Tissue Pressure Injury - Purple or maroon localized area of discolored intact skin or blood-filled blister due to damage of underlying soft tissue from pressure and/or shear.  Wound Description (Comments):   Present on Admission: Yes     Pressure Injury 10/13/20 Flank Left;Mid Unstageable - Full thickness tissue loss in which the base of the injury is covered by slough (yellow, tan, gray, green or brown) and/or eschar (tan, brown or black) in the wound bed. (Active)  10/13/20 1900  Location: Flank  Location Orientation: Left;Mid  Staging: Unstageable - Full thickness tissue loss in which the base of the injury is covered by slough (yellow, tan, gray, green or brown) and/or eschar (tan, brown or black) in the wound bed.  Wound Description (Comments):   Present on Admission: Yes     Pressure Injury 10/13/20 Elbow Left;Posterior Deep Tissue Pressure Injury - Purple or maroon localized area of discolored intact skin or blood-filled blister due to damage of underlying soft tissue from pressure and/or shear. (Active)  10/13/20 1900   Location: Elbow  Location Orientation: Left;Posterior  Staging: Deep Tissue Pressure Injury - Purple or maroon localized area of discolored intact skin or blood-filled blister due to damage of underlying soft tissue from pressure and/or shear.  Wound Description (Comments):   Present on Admission: Yes     Pressure Injury 10/13/20 Elbow Posterior;Right Deep Tissue Pressure Injury - Purple or maroon localized area of discolored intact skin or blood-filled blister due to damage of underlying soft tissue from pressure and/or shear. (Active)  10/13/20 1900  Location: Elbow  Location Orientation: Posterior;Right  Staging: Deep Tissue Pressure Injury - Purple or maroon localized area of discolored intact skin or blood-filled blister due to damage of underlying soft tissue from pressure and/or shear.  Wound Description (Comments):   Present on Admission: Yes     Consultants:   Nephrology   Procedures:   HD catheter   Antimicrobials:   Vancomycin and ceftriaxone     Subjective: Patient continue to be very weak and deconditioned, very poor oral intake and deconditioned, persistent fever spikes.   Objective: Vitals:   10/22/20 0354 10/22/20 0415 10/22/20 0748 10/22/20 1100  BP: 136/75 137/77 131/67 122/69  Pulse:  (!) 117 (!) 120 97  Resp:  19 20 18   Temp:  98.4 F (36.9 C) (!)  100.8 F (38.2 C) (!) 100.9 F (38.3 C)  TempSrc:  Oral Axillary Axillary  SpO2:  97% 95% 98%  Weight:  78.9 kg    Height:        Intake/Output Summary (Last 24 hours) at 10/22/2020 1336 Last data filed at 10/22/2020 0830 Gross per 24 hour  Intake 3880.79 ml  Output 1300 ml  Net 2580.79 ml   Filed Weights   10/18/20 0546 10/19/20 0525 10/22/20 0415  Weight: 79.4 kg 76.2 kg 78.9 kg    Examination:   General: deconditioned and ill looking appearing.  Neurology: he is somnolent but easy to arouse,  E ENT: mild pallor, no icterus, oral mucosa moist Cardiovascular: No JVD. S1-S2 present,  rhythmic, no gallops, rubs, or murmurs. No lower extremity edema. Pulmonary: positive breath sounds bilaterally, adequate air movement, no wheezing, rhonchi or rales. Gastrointestinal. Abdomen mild distended but not tender Skin. Multiple skin wounds left hemithorax and left thigh.  Musculoskeletal: no joint deformities   Left thigh     Left chest.      Data Reviewed: I have personally reviewed following labs and imaging studies  CBC: Recent Labs  Lab 10/16/20 0334 10/17/20 0336 10/20/20 0134 10/21/20 0254 10/22/20 0430  WBC 21.3* 17.7* 18.7* 15.0* 13.3*  NEUTROABS  --   --  16.1* 12.7* 10.9*  HGB 9.3* 9.3* 10.0* 8.7* 8.2*  HCT 29.8* 28.5* 30.3* 26.5* 25.1*  MCV 108.8* 102.2* 102.0* 102.3* 102.4*  PLT 250 264 547* 580* 456*   Basic Metabolic Panel: Recent Labs  Lab 10/15/20 1452 10/16/20 0334 10/16/20 1422 10/17/20 0336 10/18/20 0607 10/20/20 0112 10/21/20 0254 10/22/20 0430  NA 154* 153* 144 141 143 144 140 139  K 4.0 3.8 3.8 3.5 3.5 3.5 3.2* 3.6  CL 123* 121* 114* 110 110 110 113* 108  CO2 23 25 22 23  21* 22 21* 20*  GLUCOSE 199* 140* 135* 123* 80 101* 122* 94  BUN 84* 67* 54* 43* 26* 24* 24* 18  CREATININE 3.88* 3.04* 2.40* 2.00* 1.46* 1.11 1.04 0.93  CALCIUM 8.9 8.9 8.6* 8.7* 9.3 9.7 9.2 9.5  MG  --  1.8  --  1.4*  --   --   --  1.5*  PHOS 3.8 3.5 2.9 2.4*  --   --   --   --    GFR: Estimated Creatinine Clearance: 75 mL/min (by C-G formula based on SCr of 0.93 mg/dL). Liver Function Tests: Recent Labs  Lab 10/15/20 1452 10/16/20 0334 10/16/20 1422  ALBUMIN 1.5* 1.4* 1.4*   No results for input(s): LIPASE, AMYLASE in the last 168 hours. No results for input(s): AMMONIA in the last 168 hours. Coagulation Profile: No results for input(s): INR, PROTIME in the last 168 hours. Cardiac Enzymes: Recent Labs  Lab 10/17/20 0336  CKTOTAL 611*   BNP (last 3 results) No results for input(s): PROBNP in the last 8760 hours. HbA1C: No results for  input(s): HGBA1C in the last 72 hours. CBG: Recent Labs  Lab 10/21/20 1127 10/21/20 1639 10/21/20 2114 10/22/20 0747 10/22/20 1117  GLUCAP 109* 96 101* 105* 104*   Lipid Profile: No results for input(s): CHOL, HDL, LDLCALC, TRIG, CHOLHDL, LDLDIRECT in the last 72 hours. Thyroid Function Tests: No results for input(s): TSH, T4TOTAL, FREET4, T3FREE, THYROIDAB in the last 72 hours. Anemia Panel: No results for input(s): VITAMINB12, FOLATE, FERRITIN, TIBC, IRON, RETICCTPCT in the last 72 hours.    Radiology Studies: I have reviewed all of the imaging during this hospital visit  personally     Scheduled Meds: . clonazePAM  0.5 mg Oral BID  . collagenase   Topical Daily  . enoxaparin (LOVENOX) injection  40 mg Subcutaneous Q24H  . feeding supplement  237 mL Oral TID WC & HS  . mouth rinse  15 mL Mouth Rinse q12n4p  . mouth rinse  15 mL Mouth Rinse BID  . metoprolol tartrate  5 mg Intravenous Once  . metoprolol tartrate  25 mg Oral BID  . multivitamin with minerals  1 tablet Oral Daily  . sodium chloride flush  10-40 mL Intracatheter Q12H   Continuous Infusions: . sodium chloride 1,000 mL (10/21/20 1514)  . cefTRIAXone (ROCEPHIN)  IV 2 g (10/22/20 0914)  . dextrose 5 % and 0.45% NaCl 75 mL/hr at 10/22/20 0313  . magnesium sulfate bolus IVPB    . thiamine injection 250 mg (10/22/20 0913)  . vancomycin 1,500 mg (10/22/20 1217)     LOS: 10 days        Kazimierz Springborn Gerome Apley, MD

## 2020-10-23 ENCOUNTER — Inpatient Hospital Stay (HOSPITAL_COMMUNITY): Payer: Medicare Other

## 2020-10-23 DIAGNOSIS — G9341 Metabolic encephalopathy: Secondary | ICD-10-CM | POA: Diagnosis not present

## 2020-10-23 DIAGNOSIS — R571 Hypovolemic shock: Secondary | ICD-10-CM | POA: Diagnosis not present

## 2020-10-23 DIAGNOSIS — Z7189 Other specified counseling: Secondary | ICD-10-CM | POA: Diagnosis not present

## 2020-10-23 DIAGNOSIS — F1020 Alcohol dependence, uncomplicated: Secondary | ICD-10-CM | POA: Diagnosis not present

## 2020-10-23 DIAGNOSIS — Z515 Encounter for palliative care: Secondary | ICD-10-CM

## 2020-10-23 LAB — CBC WITH DIFFERENTIAL/PLATELET
Abs Immature Granulocytes: 0.09 10*3/uL — ABNORMAL HIGH (ref 0.00–0.07)
Basophils Absolute: 0 10*3/uL (ref 0.0–0.1)
Basophils Relative: 0 %
Eosinophils Absolute: 0.3 10*3/uL (ref 0.0–0.5)
Eosinophils Relative: 3 %
HCT: 23.4 % — ABNORMAL LOW (ref 39.0–52.0)
Hemoglobin: 7.7 g/dL — ABNORMAL LOW (ref 13.0–17.0)
Immature Granulocytes: 1 %
Lymphocytes Relative: 11 %
Lymphs Abs: 1.2 10*3/uL (ref 0.7–4.0)
MCH: 33.5 pg (ref 26.0–34.0)
MCHC: 32.9 g/dL (ref 30.0–36.0)
MCV: 101.7 fL — ABNORMAL HIGH (ref 80.0–100.0)
Monocytes Absolute: 0.7 10*3/uL (ref 0.1–1.0)
Monocytes Relative: 6 %
Neutro Abs: 9.3 10*3/uL — ABNORMAL HIGH (ref 1.7–7.7)
Neutrophils Relative %: 79 %
Platelets: 606 10*3/uL — ABNORMAL HIGH (ref 150–400)
RBC: 2.3 MIL/uL — ABNORMAL LOW (ref 4.22–5.81)
RDW: 13.8 % (ref 11.5–15.5)
WBC: 11.7 10*3/uL — ABNORMAL HIGH (ref 4.0–10.5)
nRBC: 0 % (ref 0.0–0.2)

## 2020-10-23 LAB — BASIC METABOLIC PANEL
Anion gap: 6 (ref 5–15)
BUN: 16 mg/dL (ref 8–23)
CO2: 20 mmol/L — ABNORMAL LOW (ref 22–32)
Calcium: 9.5 mg/dL (ref 8.9–10.3)
Chloride: 110 mmol/L (ref 98–111)
Creatinine, Ser: 0.85 mg/dL (ref 0.61–1.24)
GFR, Estimated: >60 mL/min (ref >60)
Glucose, Bld: 96 mg/dL (ref 70–99)
Potassium: 3.7 mmol/L (ref 3.5–5.1)
Sodium: 136 mmol/L (ref 135–145)

## 2020-10-23 LAB — GLUCOSE, CAPILLARY
Glucose-Capillary: 110 mg/dL — ABNORMAL HIGH (ref 70–99)
Glucose-Capillary: 103 mg/dL — ABNORMAL HIGH (ref 70–99)
Glucose-Capillary: 94 mg/dL (ref 70–99)
Glucose-Capillary: 87 mg/dL (ref 70–99)

## 2020-10-23 LAB — VANCOMYCIN, TROUGH: Vancomycin Tr: 16 ug/mL (ref 15–20)

## 2020-10-23 LAB — MAGNESIUM: Magnesium: 2 mg/dL (ref 1.7–2.4)

## 2020-10-23 MED ORDER — SODIUM CHLORIDE 0.9 % IV SOLN
1.0000 mg | Freq: Every day | INTRAVENOUS | Status: DC
  Administered 2020-10-23 – 2020-10-24 (×2): 1 mg via INTRAVENOUS
  Filled 2020-10-23 (×3): qty 0.2

## 2020-10-23 MED ORDER — IOHEXOL 300 MG/ML  SOLN
100.0000 mL | Freq: Once | INTRAMUSCULAR | Status: AC | PRN
  Administered 2020-10-23: 100 mL via INTRAVENOUS

## 2020-10-23 MED ORDER — VANCOMYCIN HCL 1000 MG/200ML IV SOLN
1000.0000 mg | INTRAVENOUS | Status: DC
  Administered 2020-10-24: 1000 mg via INTRAVENOUS
  Filled 2020-10-23 (×2): qty 200

## 2020-10-23 NOTE — Progress Notes (Signed)
Pharmacy Antibiotic Note  Raymond Stone is a 70 y.o. male admitted on 10/12/2020 after he was found down for an unknown amount of time. He had several pressure injuries and hypovolemic shock leading to AKI. He remains on vancomycin and rocephin for a wound infection -WBC= 11.7, tmax= 100.7, SCr= 0.85 -vancomycin peak= 42, trough= 16> calculatd AUC= 671 -cultures- ngtd  Plan: -Change vancomycin to 1000mg  IV q24h (calculatd AUC= 447) -Will follow renal function, cultures and clinical progress   Height: 5\' 9"  (175.3 cm) Weight: 77.1 kg (170 lb) IBW/kg (Calculated) : 70.7  Temp (24hrs), Avg:98.6 F (37 C), Min:97.8 F (36.6 C), Max:99.4 F (37.4 C)  Recent Labs  Lab 10/17/20 0336 10/18/20 0607 10/20/20 0112 10/20/20 0134 10/21/20 0254 10/22/20 0430 10/22/20 1519 10/23/20 0221 10/23/20 1134  WBC 17.7*  --   --  18.7* 15.0* 13.3*  --  11.7*  --   CREATININE 2.00* 1.46* 1.11  --  1.04 0.93  --  0.85  --   VANCOTROUGH  --   --   --   --   --   --   --   --  16  VANCOPEAK  --   --   --   --   --   --  42*  --   --     Estimated Creatinine Clearance: 82 mL/min (by C-G formula based on SCr of 0.85 mg/dL).    Allergies  Allergen Reactions  . Lisinopril Swelling    ANGIOEDEMA    Antimicrobials this admission: Ceftriaxone 4/10 >>  Vancomycin 4/10 >>   Microbiology results: 4/10 BCx: ngtd 4/3 MRSA PCR: neg  Thank you for allowing pharmacy to be a part of this patient's care.  Hildred Laser, PharmD Clinical Pharmacist **Pharmacist phone directory can now be found on [email or web address] ([email or web address]).  Listed under [place].

## 2020-10-23 NOTE — Progress Notes (Signed)
Manufacturing engineer [facility])  Hospital Liaison: RN note         This patient has been referred to our palliative care services in the community at [facility].  ACC will follow up with patient at facility after discharge.     If you have questions or need assistance, please call [phone] or contact the hospital Liaison listed on [facility].      Thank you for this referral.         Farrel Gordon, RN, CCM  [place] (listed on [place] under Hospice/[facility])    [phone]

## 2020-10-23 NOTE — TOC Progression Note (Addendum)
Transition of Care [facility]) - Progression Note    Patient Details  Name: Raymond Stone MRN: [id number] Date of Birth: July 04, 1951  Transition of Care [facility]) CM/SW [place], [place] Phone Number: 10/23/2020, 4:05 PM  Clinical Narrative:     CSW received callback from [facility] with [facility] and made referral for palliative services to follow patient at [facility].CSW will continue to follow.  CSW received consult for palliative services to follow patient at SNF. CSW called patients daughter [name] and [name] confirmed that she wanted CSW to reach out to [facility] for patient referral. CSW called [facility] and left a message. CSW awaiting callback. CSW will continue to follow and assist with discharge planning.    Expected Discharge Plan: [place] Community Barriers to Discharge: Continued Medical Work up  Expected Discharge Plan and Services Expected Discharge Plan: [place] In-house Referral: Clinical Social Work     Living arrangements for the past 2 months: Single Family Home                                       Social Determinants of Health (SDOH) Interventions    Readmission Risk Interventions No flowsheet data found.

## 2020-10-23 NOTE — Progress Notes (Signed)
Physical Therapy Treatment Patient Details Name: Raymond Stone MRN: [id number] DOB: 07-10-1951 Today's Date: 10/23/2020    History of Present Illness Pt is a 70 y/o male admitted 10/12/20 after being found down for unkown length of time at home. Workup for several pressure injuries, hypovolemic shock complicated by acute kidney injury. Underwent CRRT 4/3. PMH includes HTN, alcohol dependence, malnutrition.    PT Comments    Pt limited by pain and fatigue this session, although is able to progress to sitting at the edge of bed. Pt continues to demonstrate significant confusion, reporting he is tied down and unable to move his arms, while in reality pt's movement of extremities is significantly limited by weakness. Pt requires significant physical assistance for all functional mobility at this time and will benefit from acute PT services to reduce falls risk and caregiver burden. PT continues to recommend SNF placement.   Follow Up Recommendations  SNF;Supervision/Assistance - 24 hour     Equipment Recommendations  Wheelchair (measurements PT);Wheelchair cushion (measurements PT);Hospital bed (hoyer lift)    Recommendations for Other Services       Precautions / Restrictions Precautions Precautions: Fall;Other (comment) Precaution Comments: Watch HR, multiple wounds; swollen/weeping(?) extremities Required Braces or Orthoses: Other Brace Other Brace: Bilateral PRAFOs Restrictions Weight Bearing Restrictions: No    Mobility  Bed Mobility Overal bed mobility: Needs Assistance Bed Mobility: Supine to Sit;Sit to Supine     Supine to sit: Max assist;HOB elevated Sit to supine: Max assist;+2 for physical assistance   General bed mobility comments: pt requires assistance to move LEs to edge of bed, able to initiate partial roll by reaching across body. Pt requires physical assistance to elevate trunk and to pivot hips    Transfers Overall transfer level:  (pt refuses attempts at  transfers)                  Ambulation/Gait                 Stairs             Wheelchair Mobility    Modified Rankin (Stroke Patients Only)       Balance Overall balance assessment: Needs assistance Sitting-balance support: Single extremity supported;Bilateral upper extremity supported Sitting balance-Leahy Scale: Poor Sitting balance - Comments: mod-maxA, posterior lean Postural control: Posterior lean                                  Cognition Arousal/Alertness: Awake/alert Behavior During Therapy: WFL for tasks assessed/performed (mild agitation at situation, confusion greatly contributing) Overall Cognitive Status: Impaired/Different from baseline Area of Impairment: Orientation;Attention;Memory;Safety/judgement;Awareness;Problem solving                 Orientation Level: Disoriented to;Place;Time;Situation Current Attention Level: Sustained Memory: Decreased recall of precautions;Decreased short-term memory Following Commands: Follows one step commands with increased time Safety/Judgement: Decreased awareness of safety;Decreased awareness of deficits Awareness: Intellectual Problem Solving: Slow processing;Decreased initiation;Requires verbal cues;Requires tactile cues        Exercises General Exercises - Upper Extremity Elbow Flexion: Both;5 reps;AAROM Elbow Extension: AAROM;Both;5 reps Digit Composite Flexion: AROM;Both;5 reps (minimal active digit flexion) Composite Extension: AROM;Both;5 reps General Exercises - Lower Extremity Ankle Circles/Pumps: AROM;Both;10 reps Quad Sets: AROM;Both;10 reps Heel Slides: AROM;Both;5 reps    General Comments General comments (skin integrity, edema, etc.): BUE, BLE wounds      Pertinent Vitals/Pain Pain Assessment: Faces Faces Pain Scale: Hurts little more  Pain Location: generalized Pain Descriptors / Indicators: Discomfort Pain Intervention(s): Monitored during session     Home Living                      Prior Function            PT Goals (current goals can now be found in the care plan section) Acute Rehab PT Goals Patient Stated Goal: to improve strength Progress towards PT goals: Progressing toward goals (very slowly)    Frequency    Min 2X/week      PT Plan Current plan remains appropriate    Co-evaluation              AM-PAC PT "6 Clicks" Mobility   Outcome Measure  Help needed turning from your back to your side while in a flat bed without using bedrails?: A Lot Help needed moving from lying on your back to sitting on the side of a flat bed without using bedrails?: A Lot Help needed moving to and from a bed to a chair (including a wheelchair)?: Total Help needed standing up from a chair using your arms (e.g., wheelchair or bedside chair)?: Total Help needed to walk in hospital room?: Total Help needed climbing 3-5 steps with a railing? : Total 6 Click Score: 8    End of Session   Activity Tolerance: Patient limited by fatigue;Patient limited by pain Patient left: in bed;with call bell/phone within reach;with bed alarm set Nurse Communication: Mobility status;Need for lift equipment PT Visit Diagnosis: History of falling (Z91.81);Muscle weakness (generalized) (M62.81);Adult, failure to thrive (R62.7)     Time: 0819-0850 PT Time Calculation (min) (ACUTE ONLY): 31 min  Charges:  $Therapeutic Exercise: 8-22 mins $Therapeutic Activity: 8-22 mins                     Zenaida Niece, PT, DPT Acute Rehabilitation Pager: [phone]    Zenaida Niece 10/23/2020, 12:25 PM

## 2020-10-23 NOTE — Progress Notes (Signed)
Progress Note    Raymond Stone   JME:[id number]  DOB: June 03, 1951  DOA: 10/12/2020     11  PCP: [name], NP  CC: found down   Hospital Course: Raymond Stone admitted to the hospital with working diagnosis hypovolemic shock complicated by acute kidney injury/ATN.  70 year old male past medical history of hypertension who was found down, apparently he was not seen for about 5 days. When EMS arrived he was severely hypotensive, his temperature was 100 F he received intravenous fluids and transported to the emergency room.   Sodium 172, potassium 7.3, chloride>130,bicarb 15, glucose 156, BUN 211, creatinine 11.2, AST 118, ALT 42, total bilirubin is 1.5, troponin I 489-395, lactic acid 5.3. anion gap 49. ABG, pH 7.37, PCO2 26, PO2 96, bicarb 15 White cell count 18.7, hemoglobin 11.1, hematocrit 37.6, platelets 346. SARS COVID-19 negative. Urinalysis specific gravity 1.026, negative protein, 21-50 white cells, 0-5 red cells. Toxicology negative. Head CT negative for acute changes.  Chest radiograph no infiltrates. EKG had 19 bpm, normal axis, normal intervals, sinus rhythm, no ST segment or T wave changes.  Patient was diagnosed with shock, he received intravenous fluids and broad-spectrum antibiotic therapy. Patient underwent CRRT04/03with further improvement of his kidney function.  Peak CK 2,415 mild rhabdomyolysis likely not the culprit of renal failure.  Transferred to [facility]04/07.  Patient very debilitated, pending PT and OT evaluation, likely will need SNF for short term rehab.  Persistent tachycardia, now with fevers and worsening wbc, urine analysis negative for UTI and chest film with no infiltrates.  Start antibiotics for complex wounds infection with sepsis, 04/10.  Patient with persistent fever despite broad spectrum antibiotic therapy, getting CT abdomen and pelvis which showed no acute intra-abdominal pathology.  Interval History:   Patient laying in bed appearing chronically ill and very deconditioned/weak.  He is in no distress when seen this morning.  We had a long discussion regarding his decreased oral intake in general and that tube feeds may be necessary if ongoing poor intake.  He states he wishes to try eating better today in efforts to avoid a feeding tube but was agreeable if absolutely necessary. He does appear intermittently confused but was able to answer orientation questions appropriately to his name, year, place, president, city.  ROS: Constitutional: negative for chills and fevers, Respiratory: negative for cough, Cardiovascular: negative for chest pain and Gastrointestinal: negative for abdominal pain  Assessment & Plan: Hypovolemic shock with AKI/ ATN, uremic encephalopathy, hyperkalemia/ hypokalemia, hypernatremia, anion gap metabolic acidosis.Hypomagnesemia. -Renal function remained stable as well as blood pressure -Intermittent confusion but AAO x3 -Continue D5 half-normal saline -Resume dysphagia diet  Complex wounds with NEW infection and sepsis (not present on admission). Patient with complex wounds, left hemithorax and left thigh -Last documented fever, 100.9 on 10/22/2020 at 11 AM -Continue antibiotics for now and continue following blood cultures -Other than wounds, no other clear source  HTN. -Continue Lopressor  Etho abuse/ metabolic encephalopathy. Patient drinks alcohol at home, On low dose bid clonazepam and as needed lorazepam.  -Continue folic acid, thiamine, and multivitamin  Moderatecalorie protein malnutrition. - Continue with nutritional supplements, multivitamins, and thiamine. -Continue dysphagia diet.  Patient was agreeable for NG tube placement and starting tube feeds if absolutely needed  Aspiration pneumonitis (present on admission).Ruled out for bacterial pneumonia.  Continue with aspiration precautions.  Old records reviewed in assessment of this  patient  Antimicrobials: Zosyn 4/3 - 4/4 Rocephin 4/10 >> current Vanc 4/10 >> current   DVT prophylaxis:  enoxaparin (LOVENOX) injection 40 mg Start: 10/17/20 1530 SCDs Start: 10/12/20 2213   Code Status:   Code Status: Full Code Family Communication:   Disposition Plan: Status is: Inpatient  Remains inpatient appropriate because:IV treatments appropriate due to intensity of illness or inability to take PO and Inpatient level of care appropriate due to severity of illness   Dispo: The patient is from: Home              Anticipated d/c is to: SNF              Patient currently is not medically stable to d/c.   Difficult to place patient No  Risk of unplanned readmission score: Unplanned Admission- Pilot do not use: 13.39   Objective: Blood pressure 134/73, pulse 97, temperature 98.4 F (36.9 C), temperature source Oral, resp. rate 18, height 5\' 9"  (1.753 m), weight 77.1 kg, SpO2 100 %.  Examination: General appearance: Chronically ill-appearing and globally weak/deconditioned man laying in bed Head: Normocephalic, without obvious abnormality, atraumatic Eyes: EOMI Lungs: clear to auscultation bilaterally Heart: regular rate and rhythm and S1, S2 normal Abdomen: normal findings: bowel sounds normal and soft, non-tender Extremities: edematous Skin: scattered excoriations and dry skin Neurologic: worsening LE weakness compared to UE but follows commands and moves all 4 extremities   Consultants:     Procedures:     Data Reviewed: I have personally reviewed following labs and imaging studies Results for orders placed or performed during the hospital encounter of 10/12/20 (from the past 24 hour(s))  Glucose, capillary     Status: Abnormal   Collection Time: 10/22/20  8:48 PM  Result Value Ref Range   Glucose-Capillary 101 (H) 70 - 99 mg/dL  Magnesium     Status: None   Collection Time: 10/23/20  2:21 AM  Result Value Ref Range   Magnesium 2.0 1.7 - 2.4 mg/dL   Basic metabolic panel     Status: Abnormal   Collection Time: 10/23/20  2:21 AM  Result Value Ref Range   Sodium 136 135 - 145 mmol/L   Potassium 3.7 3.5 - 5.1 mmol/L   Chloride 110 98 - 111 mmol/L   CO2 20 (L) 22 - 32 mmol/L   Glucose, Bld 96 70 - 99 mg/dL   BUN 16 8 - 23 mg/dL   Creatinine, Ser 0.85 0.61 - 1.24 mg/dL   Calcium 9.5 8.9 - 10.3 mg/dL   GFR, Estimated >60 >60 mL/min   Anion gap 6 5 - 15  CBC with Differential/Platelet     Status: Abnormal   Collection Time: 10/23/20  2:21 AM  Result Value Ref Range   WBC 11.7 (H) 4.0 - 10.5 K/uL   RBC 2.30 (L) 4.22 - 5.81 MIL/uL   Hemoglobin 7.7 (L) 13.0 - 17.0 g/dL   HCT 23.4 (L) 39.0 - 52.0 %   MCV 101.7 (H) 80.0 - 100.0 fL   MCH 33.5 26.0 - 34.0 pg   MCHC 32.9 30.0 - 36.0 g/dL   RDW 13.8 11.5 - 15.5 %   Platelets 606 (H) 150 - 400 K/uL   nRBC 0.0 0.0 - 0.2 %   Neutrophils Relative % 79 %   Neutro Abs 9.3 (H) 1.7 - 7.7 K/uL   Lymphocytes Relative 11 %   Lymphs Abs 1.2 0.7 - 4.0 K/uL   Monocytes Relative 6 %   Monocytes Absolute 0.7 0.1 - 1.0 K/uL   Eosinophils Relative 3 %   Eosinophils Absolute 0.3 0.0 - 0.5 K/uL  Basophils Relative 0 %   Basophils Absolute 0.0 0.0 - 0.1 K/uL   Immature Granulocytes 1 %   Abs Immature Granulocytes 0.09 (H) 0.00 - 0.07 K/uL  Glucose, capillary     Status: Abnormal   Collection Time: 10/23/20  7:58 AM  Result Value Ref Range   Glucose-Capillary 110 (H) 70 - 99 mg/dL  Glucose, capillary     Status: Abnormal   Collection Time: 10/23/20 11:11 AM  Result Value Ref Range   Glucose-Capillary 103 (H) 70 - 99 mg/dL  Vancomycin, trough     Status: None   Collection Time: 10/23/20 11:34 AM  Result Value Ref Range   Vancomycin Tr 16 15 - 20 ug/mL  Glucose, capillary     Status: None   Collection Time: 10/23/20  4:25 PM  Result Value Ref Range   Glucose-Capillary 94 70 - 99 mg/dL    Recent Results (from the past 240 hour(s))  Culture, blood (routine x 2)     Status: None (Preliminary  result)   Collection Time: 10/20/20  1:34 AM   Specimen: BLOOD  Result Value Ref Range Status   Specimen Description BLOOD LEFT HAND  Final   Special Requests   Final    BOTTLES DRAWN AEROBIC AND ANAEROBIC Blood Culture adequate volume   Culture   Final    NO GROWTH 3 DAYS Performed at [facility], [address] [place], [place] [address]    Report Status PENDING  Incomplete  Culture, blood (routine x 2)     Status: None (Preliminary result)   Collection Time: 10/20/20  1:43 AM   Specimen: BLOOD  Result Value Ref Range Status   Specimen Description BLOOD RIGHT HAND  Final   Special Requests   Final    BOTTLES DRAWN AEROBIC AND ANAEROBIC Blood Culture adequate volume   Culture   Final    NO GROWTH 3 DAYS Performed at [facility], [place] [address], [place], [place] [address]    Report Status PENDING  Incomplete     Radiology Studies: CT ABDOMEN PELVIS W CONTRAST  Result Date: 10/23/2020 CLINICAL DATA:  Abdominal abscess EXAM: CT ABDOMEN AND PELVIS WITH CONTRAST TECHNIQUE: Multidetector CT imaging of the abdomen and pelvis was performed using the standard protocol following bolus administration of intravenous contrast. CONTRAST:  170mL OMNIPAQUE IOHEXOL 300 MG/ML  SOLN COMPARISON:  None. FINDINGS: Lower chest: Mild bibasilar atelectasis. The visualized heart and pericardium are unremarkable. Hepatobiliary: No focal liver abnormality is seen. No gallstones, gallbladder wall thickening, or biliary dilatation. Pancreas: Unremarkable Spleen: Unremarkable Adrenals/Urinary Tract: The adrenal glands are unremarkable. The kidneys are normal in size and position. Simple cortical cyst noted within the lower pole the right kidney. The kidneys are otherwise unremarkable. Gas is seen non dependently within the bladder lumen, nonspecific but possibly related to recent catheterization. The bladder is mildly distended, but is otherwise unremarkable. Stomach/Bowel: Severe descending and  sigmoid colonic diverticulosis. Scattered diverticular seen throughout the remainder of the colon. The stomach, small bowel, and large bowel are otherwise unremarkable. Appendix normal. No evidence of obstruction or focal inflammation. No free intraperitoneal gas or fluid. Vascular/Lymphatic: Minimal aortoiliac atherosclerotic calcification is present. No aortic aneurysm. No pathologic adenopathy identified within the abdomen and pelvis. Reproductive: Prostate is unremarkable. Other: Tiny fat containing umbilical hernia. Rectum unremarkable. Focal soft tissue infiltration within the subcutaneous fat of the right lower quadrant abdominal wall and associated punctate foci of gas likely related to subcutaneous injection. 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 No acute bone abnormality. Degenerative changes are seen within the lumbar spine and hips bilaterally. No lytic or blastic bone lesions are identified. IMPRESSION: No acute intra-abdominal pathology. No intra-abdominal abscess identified. Distal colonic diverticulosis. No superimposed inflammatory change identified. Small gas within the bladder lumen non dependently. Correlation for recent bladder catheterization may be helpful. Aortic Atherosclerosis (ICD10-I70.0).  In Electronically Signed   By: Fidela Salisbury MD   On: 10/23/2020 04:14   CT ABDOMEN PELVIS W CONTRAST  Final Result    DG CHEST PORT 1 VIEW  Final Result    DG CHEST PORT 1 VIEW  Final Result    US RENAL  Final Result    CT Head Wo Contrast  Final Result    DG Chest Port 1 View  Final Result      Scheduled Meds: . clonazePAM  0.5 mg Oral BID  . collagenase   Topical Daily  . enoxaparin (LOVENOX) injection  40 mg Subcutaneous Q24H  . feeding supplement  237 mL Oral TID WC & HS  . mouth rinse  15 mL Mouth Rinse BID  . metoprolol tartrate  5 mg Intravenous Once  . metoprolol tartrate  25 mg Oral BID  . multivitamin with  minerals  1 tablet Oral Daily   PRN Meds: sodium chloride, acetaminophen, acetaminophen, docusate sodium, lip balm, LORazepam, ondansetron (ZOFRAN) IV, polyethylene glycol, polyvinyl alcohol Continuous Infusions: . sodium chloride 1,000 mL (10/21/20 1514)  . cefTRIAXone (ROCEPHIN)  IV 2 g (10/23/20 1035)  . dextrose 5 % and 0.45% NaCl 75 mL/hr at 10/23/20 1139  . folic acid (FOLVITE) IVPB 1 mg (10/23/20 1141)  . thiamine injection 250 mg (10/23/20 0956)  . [START ON 10/24/2020] vancomycin       LOS: 11 days  Time spent: Greater than 50% of the 35 minute visit was spent in counseling/coordination of care for the patient as laid out in the A&P.   Dwyane Dee, MD [facility] 10/23/2020, 5:36 PM

## 2020-10-23 NOTE — Progress Notes (Addendum)
Physical Therapy Wound Treatment Patient Details  Name: Tay Whitwell MRN: [id number] Date of Birth: February 21, 1951  Today's Date: 10/23/2020 Time: 0177-9390 Time Calculation (min): 28 min  Subjective  Subjective Assessment Subjective: Pt pleasant and agreeable to hydrotherapy however becomes mildly agitated when hearing male voices in the hallway at the end of the session. Patient and Family Stated Goals: None stated Date of Onset:  (Unsure) Prior Treatments: Dressing changes prior to hydro being consulted.  Pain Score:  Pt with mild pain reported this session during debridement. Pt was not premedicated and otherwise tolerated treatment well without any other complaints.  Wound Assessment  Pressure Injury 10/13/20 Flank Left;Mid Unstageable - Full thickness tissue loss in which the base of the injury is covered by slough (yellow, tan, gray, green or brown) and/or eschar (tan, brown or black) in the wound bed. (Active)  Wound Image   10/23/20 1330  Dressing Type Barrier Film (skin prep);Foam - Lift dressing to assess site every shift;Gauze (Comment);Moist to moist;Santyl 10/23/20 1330  Dressing Changed;Clean;Dry;Intact 10/23/20 1330  Dressing Change Frequency Daily 10/23/20 1330  State of Healing Eschar 10/23/20 1330  Site / Wound Assessment Pink;Yellow;Brown 10/23/20 1330  % Wound base Red or Granulating 25% 10/23/20 1330  % Wound base Yellow/Fibrinous Exudate 70% 10/23/20 1330  % Wound base Black/Eschar 5% 10/23/20 1330  % Wound base Other/Granulation Tissue (Comment) 0% 10/23/20 1330  Peri-wound Assessment Intact;Black 10/23/20 1330  Wound Length (cm) 6 cm 10/23/20 1100  Wound Width (cm) 2.8 cm 10/23/20 1100  Wound Depth (cm) 0.2 cm 10/23/20 1100  Wound Surface Area (cm^2) 16.8 cm^2 10/23/20 1100  Wound Volume (cm^3) 3.36 cm^3 10/23/20 1100  Tunneling (cm) 0 10/15/20 1349  Undermining (cm) 0 10/15/20 1349  Margins Unattached edges (unapproximated) 10/23/20 1330  Drainage Amount  Minimal 10/23/20 1330  Drainage Description Serosanguineous 10/23/20 1330  Treatment Debridement (Selective);Hydrotherapy (Pulse lavage);Packing (Saline gauze) 10/23/20 1330      Hydrotherapy Pulsed lavage therapy - wound location: L flank Pulsed Lavage with Suction (psi): 8 psi Pulsed Lavage with Suction - Normal Saline Used: 1000 mL Pulsed Lavage Tip: Tip with splash shield Selective Debridement Selective Debridement - Location: L flank Selective Debridement - Tools Used: Scalpel,Forceps Selective Debridement - Tissue Removed: yellow slough and brown necrotic tissue    Wound Assessment and Plan  Wound Therapy - Assess/Plan/Recommendations Wound Therapy - Clinical Statement: Wound appears improved however  slough still covering a majority of the wound. Debridement is difficult due to thin tissue around rib cage, and extra care is being taken not to disturb any underlying healthy tissue. Will benefit from continued hydrotherapy for selective removal of uviable tissue, to decrease bioburden and promote wound bed healing. Wound Therapy - Functional Problem List: Global weakness in the setting of undetermined time down. Factors Delaying/Impairing Wound Healing: Infection - systemic/local,Immobility Hydrotherapy Plan: Debridement,Dressing change,Patient/family education,Pulsatile lavage with suction Wound Therapy - Frequency: 3X / week Wound Therapy - Current Recommendations: PT Wound Therapy - Follow Up Recommendations: dressing changes by RN  Wound Therapy Goals- Improve the function of patient's integumentary system by progressing the wound(s) through the phases of wound healing (inflammation - proliferation - remodeling) by: Wound Therapy Goals - Improve the function of patient's integumentary system by progressing the wound(s) through the phases of wound healing by: Decrease Necrotic Tissue to: 20% Decrease Necrotic Tissue - Progress: Progressing toward goal Increase Granulation  Tissue to: 80% Increase Granulation Tissue - Progress: Progressing toward goal Goals/treatment plan/discharge plan were made with and agreed upon by  patient/family: No, Patient unable to participate in goals/treatment/discharge plan and family unavailable Time For Goal Achievement: 7 days Wound Therapy - Potential for Goals: Good  Goals will be updated until maximal potential achieved or discharge criteria met.  Discharge criteria: when goals achieved, discharge from hospital, MD decision/surgical intervention, no progress towards goals, refusal/missing three consecutive treatments without notification or medical reason.  GP     Charges PT Wound Care Charges $Wound Debridement up to 20 cm: < or equal to 20 cm $PT Hydrotherapy Dressing: 1 dressing $PT PLS Gun and Tip: 1 Supply $PT Hydrotherapy Visit: 1 Visit       Thelma Comp 10/23/2020, 1:37 PM   Rolinda Roan, PT, DPT Acute Rehabilitation Services Pager: [phone] Office: [phone]

## 2020-10-23 NOTE — Consult Note (Signed)
Consultation Note Date: 10/23/2020   Patient Name: Raymond Stone  DOB: 1950-12-10  MRN: [id number]  Age / Sex: 71 y.o., male  PCP: [name], NP Referring Physician: Dwyane Dee, MD  Reason for Consultation: Establishing goals of care  HPI/Patient Profile: 70 y.o. male  with past medical history of HTN and ETOH abuse admitted on 10/12/2020 after being found down at home - had not been seen for 5 days. He was diagnosed with shock and received IV fluids and antibiotics. Did require CRRT d/t AKI. Renal function now improved but patient remains confused, refusing meds and PO intake. New fevers 4/10 and antibiotics restarted - concern for wound infection. PMT consulted to discuss [place].    Clinical Assessment and Goals of Care: I have reviewed medical records including EPIC notes, labs and imaging, received report from RN, assessed the patient and then met with patient  to discuss diagnosis prognosis, GOC, EOL wishes, disposition and options.  I introduced Palliative Medicine as specialized medical care for people living with serious illness. It focuses on providing relief from the symptoms and stress of a serious illness. The goal is to improve quality of life for both the patient and the family.  Patient tells me he is at the hospital though he does not know why. He knows his name and age. He is fixated on figuring out "who did this to him". Seems paranoid.   He is able to tell me that he lives alone with no significant medical concerns. We discuss his poor PO intake - he indicates he is motivated to eat more. Also tells me he will take medications the nurse brings him.   Unable to have full goals of care discussion d/t his AMS but he does tell me it is okay to discuss care with his daughter. I attempted to discuss code status with him - he indicates he wants to remain full code.   I spoke with his daughter, Raymond Stone. She confirms she is his  biological daughter and does want to be included in medical decisions but she also states she does not know him well and refers me to his sister to discuss his baseline status. Raymond Stone is currently living in [place]. Raymond Stone requests that we continue to call her to provide updates.  I called and spoke with patient's sister Raymond Stone. She confirms patient's above statements - he lives alone and was able to care for himself independently. She also shares that she has visited patient in the evenings - she agrees that his mental status is altered, this is not his baseline but she also feels he understands the situation - He was able to tell her how to pay his bills - when/what was due. She also shares that he eats well for the family when they are at bedside. Raymond Stone tells me patient was not working, he was receiving disability. She tells me patient's ETOH use has increased significantly - she feels he was severely depressed as he has lost multiple family members and friends recently. Most notably is his best friend's death. Shortly after his death his sister died then his brother. Raymond Stone believes these deaths have increased his alcohol consumption and he has "given up". Tells me he has become very isolated and "went downhill quick".  I attempted to elicit values and goals of care important to the patient.  Raymond Stone tells me this is not something he has ever discussed with family members and she would like the patient to share  his own wishes - we discuss difficulties of this currently.   Discussed with family the importance of continued conversation with family and the medical providers regarding overall plan of care and treatment options, ensuring decisions are within the context of the patient's values and GOCs.    Questions and concerns were addressed. The family was encouraged to call with questions or concerns.   Primary Decision Maker NEXT OF KIN - daughter Raymond Stone    SUMMARY OF  RECOMMENDATIONS   - poor PO intake though family reports he eats when they are at bedside. Per chart review he took majority of morning meds. Currently NPO d/t abdominal scan? - family unsure of GOC and patient with AMS making [place] discussions difficult, he was able to confirm full code status - PMT will follow  Code Status/Advance Care Planning:  Full code  Discharge Planning: [facility] for rehab with Palliative care service follow-up       Primary Diagnoses: Present on Admission: . AKI (acute kidney injury) ([place]) . Essential hypertension . Alcohol dependence ([place])   I have reviewed the medical record, interviewed the patient and family, and examined the patient. The following aspects are pertinent.  Past Medical History:  Diagnosis Date  . Anemia   . Eczema   . Fluid collection (edema) in the arms, legs, hands and feet   . Hypertension    Social History   Socioeconomic History  . Marital status: Single    Spouse name: Not on file  . Number of children: 1  . Years of education: Not on file  . Highest education level: Not on file  Occupational History  . Not on file  Tobacco Use  . Smoking status: Never Smoker  . Smokeless tobacco: Never Used  Vaping Use  . Vaping Use: Never used  Substance and Sexual Activity  . Alcohol use: Yes    Alcohol/week: 3.0 - 4.0 standard drinks    Types: 3 - 4 Cans of beer per week    Comment: beer everyday  . Drug use: No  . Sexual activity: Yes    Partners: Female    Birth control/protection: Condom  Other Topics Concern  . Not on file  Social History Narrative  . Not on file   Social Determinants of Health   Financial Resource Strain: Not on file  Food Insecurity: Not on file  Transportation Needs: Not on file  Physical Activity: Not on file  Stress: Not on file  Social Connections: Not on file   Family History  Problem Relation Age of Onset  . Lupus Daughter   . Stomach cancer Brother   . Colon cancer  Neg Hx   . Colon polyps Neg Hx   . Esophageal cancer Neg Hx   . Rectal cancer Neg Hx    Scheduled Meds: . clonazePAM  0.5 mg Oral BID  . collagenase   Topical Daily  . enoxaparin (LOVENOX) injection  40 mg Subcutaneous Q24H  . feeding supplement  237 mL Oral TID WC & HS  . mouth rinse  15 mL Mouth Rinse BID  . metoprolol tartrate  5 mg Intravenous Once  . metoprolol tartrate  25 mg Oral BID  . multivitamin with minerals  1 tablet Oral Daily   Continuous Infusions: . sodium chloride 1,000 mL (10/21/20 1514)  . cefTRIAXone (ROCEPHIN)  IV 2 g (10/23/20 1035)  . dextrose 5 % and 0.45% NaCl 75 mL/hr at 10/23/20 1139  . folic acid (FOLVITE) IVPB 1  mg (10/23/20 1141)  . thiamine injection 250 mg (10/23/20 0956)  . vancomycin 1,500 mg (10/23/20 1312)   PRN Meds:.sodium chloride, acetaminophen, acetaminophen, docusate sodium, lip balm, LORazepam, ondansetron (ZOFRAN) IV, polyethylene glycol, polyvinyl alcohol Allergies  Allergen Reactions  . Lisinopril Swelling    ANGIOEDEMA   Review of Systems  Unable to perform ROS: Mental status change    Physical Exam Constitutional:      General: He is not in acute distress.    Comments: lethargic  Pulmonary:     Effort: Pulmonary effort is normal.  Skin:    General: Skin is warm and dry.  Neurological:     Mental Status: He is disoriented.  Psychiatric:        Thought Content: Thought content is paranoid.     Vital Signs: BP 137/77 (BP Location: Right Arm)   Pulse 92   Temp 97.8 F (36.6 C) (Oral)   Resp 18   Ht '5\' 9"'  (1.753 m)   Wt 77.1 kg   SpO2 100%   BMI 25.10 kg/m  Pain Scale: 0-10   Pain Score: 0-No pain   SpO2: SpO2: 100 % O2 Device:SpO2: 100 % O2 Flow Rate: .O2 Flow Rate (L/min): 1 L/min  IO: Intake/output summary:   Intake/Output Summary (Last 24 hours) at 10/23/2020 1338 Last data filed at 10/22/2020 1800 Gross per 24 hour  Intake 570 ml  Output 1000 ml  Net -430 ml    LBM: Last BM Date:  10/18/20 Baseline Weight: Weight: 87.9 kg (from 2021 records) Most recent weight: Weight: 77.1 kg     Palliative Assessment/Data: PPS 20%    Time Total: 65 minutes Greater than 50%  of this time was spent counseling and coordinating care related to the above assessment and plan.  Juel Burrow, DNP, AGNP-C Palliative Medicine Team [phone] Pager: [phone]

## 2020-10-23 NOTE — Progress Notes (Signed)
SLP Cancellation Note  Patient Details Name: Raymond Stone MRN: [id number] DOB: Apr 05, 1951   Cancelled treatment:       Reason Eval/Treat Not Completed: Medical issues which prohibited therapy. Per RN, pt made NPO for abdominal scan. Diet not yet resumed. Will plan to f/u for ongoing swallowing tx once medically cleared to resume PO intake.     Osie Bond., M.A. [facility] Pager [phone] Office [phone]  10/23/2020, 9:20 AM

## 2020-10-24 DIAGNOSIS — Z515 Encounter for palliative care: Secondary | ICD-10-CM | POA: Diagnosis not present

## 2020-10-24 DIAGNOSIS — Z7189 Other specified counseling: Secondary | ICD-10-CM | POA: Diagnosis not present

## 2020-10-24 DIAGNOSIS — G9341 Metabolic encephalopathy: Secondary | ICD-10-CM | POA: Diagnosis not present

## 2020-10-24 DIAGNOSIS — E440 Moderate protein-calorie malnutrition: Secondary | ICD-10-CM | POA: Diagnosis not present

## 2020-10-24 DIAGNOSIS — R630 Anorexia: Secondary | ICD-10-CM

## 2020-10-24 DIAGNOSIS — R571 Hypovolemic shock: Secondary | ICD-10-CM | POA: Diagnosis not present

## 2020-10-24 LAB — BASIC METABOLIC PANEL
Anion gap: 9 (ref 5–15)
BUN: 14 mg/dL (ref 8–23)
CO2: 18 mmol/L — ABNORMAL LOW (ref 22–32)
Calcium: 9.6 mg/dL (ref 8.9–10.3)
Chloride: 107 mmol/L (ref 98–111)
Creatinine, Ser: 0.79 mg/dL (ref 0.61–1.24)
GFR, Estimated: >60 mL/min (ref >60)
Glucose, Bld: 103 mg/dL — ABNORMAL HIGH (ref 70–99)
Potassium: 3.5 mmol/L (ref 3.5–5.1)
Sodium: 134 mmol/L — ABNORMAL LOW (ref 135–145)

## 2020-10-24 LAB — CBC WITH DIFFERENTIAL/PLATELET
Abs Immature Granulocytes: 0.07 10*3/uL (ref 0.00–0.07)
Basophils Absolute: 0 10*3/uL (ref 0.0–0.1)
Basophils Relative: 0 %
Eosinophils Absolute: 0.3 10*3/uL (ref 0.0–0.5)
Eosinophils Relative: 3 %
HCT: 24.7 % — ABNORMAL LOW (ref 39.0–52.0)
Hemoglobin: 8.2 g/dL — ABNORMAL LOW (ref 13.0–17.0)
Immature Granulocytes: 1 %
Lymphocytes Relative: 10 %
Lymphs Abs: 1.1 10*3/uL (ref 0.7–4.0)
MCH: 33.6 pg (ref 26.0–34.0)
MCHC: 33.2 g/dL (ref 30.0–36.0)
MCV: 101.2 fL — ABNORMAL HIGH (ref 80.0–100.0)
Monocytes Absolute: 0.8 10*3/uL (ref 0.1–1.0)
Monocytes Relative: 7 %
Neutro Abs: 8.4 10*3/uL — ABNORMAL HIGH (ref 1.7–7.7)
Neutrophils Relative %: 79 %
Platelets: 574 10*3/uL — ABNORMAL HIGH (ref 150–400)
RBC: 2.44 MIL/uL — ABNORMAL LOW (ref 4.22–5.81)
RDW: 13.5 % (ref 11.5–15.5)
WBC: 10.7 10*3/uL — ABNORMAL HIGH (ref 4.0–10.5)
nRBC: 0 % (ref 0.0–0.2)

## 2020-10-24 LAB — GLUCOSE, CAPILLARY
Glucose-Capillary: 88 mg/dL (ref 70–99)
Glucose-Capillary: 109 mg/dL — ABNORMAL HIGH (ref 70–99)
Glucose-Capillary: 108 mg/dL — ABNORMAL HIGH (ref 70–99)
Glucose-Capillary: 100 mg/dL — ABNORMAL HIGH (ref 70–99)

## 2020-10-24 LAB — MAGNESIUM: Magnesium: 1.6 mg/dL — ABNORMAL LOW (ref 1.7–2.4)

## 2020-10-24 MED ORDER — POTASSIUM CHLORIDE 20 MEQ PO PACK
40.0000 meq | PACK | Freq: Once | ORAL | Status: AC
  Administered 2020-10-24: 40 meq via ORAL
  Filled 2020-10-24: qty 2

## 2020-10-24 MED ORDER — MAGNESIUM OXIDE 400 (241.3 MG) MG PO TABS
800.0000 mg | ORAL_TABLET | Freq: Once | ORAL | Status: AC
  Administered 2020-10-24: 800 mg via ORAL
  Filled 2020-10-24: qty 2

## 2020-10-24 MED ORDER — MIRTAZAPINE 15 MG PO TBDP
15.0000 mg | ORAL_TABLET | Freq: Every day | ORAL | Status: DC
  Administered 2020-10-24 – 2020-11-06 (×13): 15 mg via ORAL
  Filled 2020-10-24 (×17): qty 1

## 2020-10-24 MED ORDER — SENNA 8.6 MG PO TABS: 1.0000 | ORAL_TABLET | Freq: Every day | ORAL | Status: DC

## 2020-10-24 NOTE — Progress Notes (Signed)
Physical Therapy Treatment Patient Details Name: Raymond Stone MRN: [id number] DOB: 1950/09/12 Today's Date: 10/24/2020    History of Present Illness Pt is a 70 y/o male admitted 10/12/20 after being found down for unkown length of time at home. Workup for several pressure injuries, hypovolemic shock complicated by acute kidney injury. Underwent CRRT 4/3. PMH includes HTN, alcohol dependence, malnutrition.    PT Comments    Pt with improved activity tolerance this session, progressing to transfer training. Pt continues to require significant physical assistance for all functional mobility, but does demonstrate improvements in AROM and UE strength. Pt remains disoriented at this time but is more accepting of re-orientation this session. Pt will continue to benefit from aggressive mobilization and acute PT POC to improve mobility and reduce falls risk. PT hopeful for progression to out of bed transfers next session.   Follow Up Recommendations  SNF;Supervision/Assistance - 24 hour     Equipment Recommendations  Wheelchair (measurements PT);Wheelchair cushion (measurements PT);Hospital bed (mechanical lift)    Recommendations for Other Services       Precautions / Restrictions Precautions Precautions: Fall;Other (comment) Precaution Comments: Watch HR, multiple wounds; swollen/weeping(?) extremities Required Braces or Orthoses: Other Brace Other Brace: Bilateral PRAFOs Restrictions Weight Bearing Restrictions: No    Mobility  Bed Mobility Overal bed mobility: Needs Assistance Bed Mobility: Sit to Supine;Supine to Sit     Supine to sit: Max assist;HOB elevated Sit to supine: Total assist;HOB elevated        Transfers Overall transfer level: Needs assistance Equipment used: 2 person hand held assist Transfers: Sit to/from Stand Sit to Stand: Total assist;+2 physical assistance         General transfer comment: pt performs 2 sit to stands with bilateral knee block, PT  utilizing gait belt and pad to facilitate hip extension. Pt remains unable to fully extend hips and trunk  Ambulation/Gait Ambulation/Gait assistance:  (deferred 2/2 weakness)               Stairs             Wheelchair Mobility    Modified Rankin (Stroke Patients Only)       Balance Overall balance assessment: Needs assistance Sitting-balance support: Single extremity supported;Bilateral upper extremity supported;Feet supported Sitting balance-Leahy Scale: Poor Sitting balance - Comments: pt initially requires maxA, progresses to minA by end of session, pt with strong L lateral lean which PT attempts to counter by encouraging pt to lean onto R elbow Postural control: Left lateral lean Standing balance support: Bilateral upper extremity supported Standing balance-Leahy Scale: Zero Standing balance comment: totalA x2 with bilateral knee block                            Cognition Arousal/Alertness: Awake/alert Behavior During Therapy: WFL for tasks assessed/performed Overall Cognitive Status: Impaired/Different from baseline Area of Impairment: Orientation;Memory;Following commands;Safety/judgement;Awareness;Problem solving                 Orientation Level: Disoriented to;Place;Time;Situation Current Attention Level: Sustained Memory: Decreased recall of precautions;Decreased short-term memory Following Commands: Follows one step commands with increased time Safety/Judgement: Decreased awareness of safety;Decreased awareness of deficits Awareness: Intellectual Problem Solving: Slow processing;Decreased initiation;Requires tactile cues;Requires verbal cues        Exercises General Exercises - Upper Extremity Shoulder Flexion: AROM;Both;5 reps Elbow Flexion: AROM;Both;5 reps    General Comments General comments (skin integrity, edema, etc.): pt tachy to 130s with activity, denies SOB or orthostatic  symptoms, BUE remain edematous       Pertinent Vitals/Pain Pain Assessment: Faces Faces Pain Scale: Hurts little more Pain Location: neck Pain Descriptors / Indicators: Grimacing Pain Intervention(s): Monitored during session    Home Living                      Prior Function            PT Goals (current goals can now be found in the care plan section) Acute Rehab PT Goals Patient Stated Goal: to improve strength Progress towards PT goals: Progressing toward goals (slowly)    Frequency    Min 2X/week      PT Plan Current plan remains appropriate    Co-evaluation              AM-PAC PT "6 Clicks" Mobility   Outcome Measure  Help needed turning from your back to your side while in a flat bed without using bedrails?: A Lot Help needed moving from lying on your back to sitting on the side of a flat bed without using bedrails?: A Lot Help needed moving to and from a bed to a chair (including a wheelchair)?: Total Help needed standing up from a chair using your arms (e.g., wheelchair or bedside chair)?: Total Help needed to walk in hospital room?: Total Help needed climbing 3-5 steps with a railing? : Total 6 Click Score: 8    End of Session Equipment Utilized During Treatment: Gait belt Activity Tolerance: Patient tolerated treatment well Patient left: in bed;with call bell/phone within reach;with bed alarm set Nurse Communication: Mobility status;Need for lift equipment PT Visit Diagnosis: History of falling (Z91.81);Muscle weakness (generalized) (M62.81);Adult, failure to thrive (R62.7)     Time: 4665-9935 PT Time Calculation (min) (ACUTE ONLY): 24 min  Charges:  $Therapeutic Activity: 23-37 mins                     Zenaida Niece, PT, DPT Acute Rehabilitation Pager: [phone]    Zenaida Niece 10/24/2020, 12:24 PM

## 2020-10-24 NOTE — Progress Notes (Signed)
Focus of session on A/AROM B UEs and elevating on pillows to reduce edema. Worked toward hand to face for grooming. Pt alert, states he has a lot on his mind. Aware he is in the hospital, unclear of situation.    10/24/20 1500  OT Visit Information  Last OT Received On 10/24/20  Assistance Needed +2  History of Present Illness Pt is a 70 y/o male admitted 10/12/20 after being found down for unkown length of time at home. Workup for several pressure injuries, hypovolemic shock complicated by acute kidney injury. Underwent CRRT 4/3. PMH includes HTN, alcohol dependence, malnutrition.  Precautions  Precautions Fall;Other (comment)  Precaution Comments Watch HR, multiple wounds; swollen/weeping(?) extremities  Required Braces or Orthoses Other Brace  Other Brace Bilateral PRAFOs  Pain Assessment  Pain Assessment Faces  Faces Pain Scale 4  Pain Location R shoulder with ROM  Pain Descriptors / Indicators Grimacing  Pain Intervention(s) Monitored during session;Repositioned  Cognition  Arousal/Alertness Awake/alert  Behavior During Therapy Flat affect  Overall Cognitive Status Impaired/Different from baseline  Area of Impairment Orientation;Memory;Following commands;Safety/judgement;Awareness;Problem solving  Orientation Level Disoriented to;Time;Situation  Current Attention Level Sustained  Memory Decreased recall of precautions;Decreased short-term memory  Following Commands Follows one step commands with increased time  Safety/Judgement Decreased awareness of safety;Decreased awareness of deficits  Awareness Intellectual  Problem Solving Slow processing;Decreased initiation;Requires tactile cues;Requires verbal cues  General Comments Pt alert throughout session. Gets overwhelmed with too much conversation and shuts down. Pt is aware he is in "the hospital."  ADL  Overall ADL's  Needs assistance/impaired  Grooming Wash/dry face;Bed level;Moderate assistance  Grooming Details (indicate cue  type and reason) supported elbow as pt brought wash cloth to face  Bed Mobility  Overal bed mobility Needs Assistance  Bed Mobility Rolling  Rolling Total assist;+2 for physical assistance  Exercises  Exercises General Upper Extremity  General Exercises - Upper Extremity  Shoulder Flexion AAROM;Both;10 reps;Supine  Shoulder ABduction AAROM;Both;10 reps;Supine  Shoulder ADduction AAROM;Both;10 reps;Supine  Shoulder Horizontal ABduction AAROM;Both;10 reps;Supine  Elbow Flexion Both;AROM;5 reps;Supine  Elbow Extension AROM;Both;10 reps;Supine  Digit Composite Flexion AROM;Both;5 reps  Composite Extension AROM;Both;5 reps  Shoulder Horizontal ADduction AAROM;Both;10 reps;Supine  OT - End of Session  Activity Tolerance Patient tolerated treatment well  Patient left in bed;with call bell/phone within reach;with bed alarm set;with nursing/sitter in room  OT Assessment/Plan  OT Plan Discharge plan remains appropriate;Frequency remains appropriate  OT Visit Diagnosis Muscle weakness (generalized) (M62.81);History of falling (Z91.81);Other symptoms and signs involving cognitive function  OT Frequency (ACUTE ONLY) Min 2X/week  Follow Up Recommendations SNF;Supervision/Assistance - 24 hour  OT Equipment Other (comment) (defer to next venue)  AM-PAC OT "6 Clicks" Daily Activity Outcome Measure (Version 2)  Help from another person eating meals? 1  Help from another person taking care of personal grooming? 2  Help from another person toileting, which includes using toliet, bedpan, or urinal? 1  Help from another person bathing (including washing, rinsing, drying)? 1  Help from another person to put on and taking off regular upper body clothing? 1  Help from another person to put on and taking off regular lower body clothing? 1  6 Click Score 7  OT Goal Progression  Progress towards OT goals Progressing toward goals  Acute Rehab OT Goals  Patient Stated Goal to improve strength  OT Goal  Formulation With patient  Time For Goal Achievement 11/01/20  Potential to Achieve Goals Good  OT Time Calculation  OT Start Time (ACUTE ONLY)  1400  OT Stop Time (ACUTE ONLY) 1421  OT Time Calculation (min) 21 min  OT General Charges  $OT Visit 1 Visit  OT Treatments  $Therapeutic Exercise 8-22 mins  [name], OTR/L Acute Rehabilitation Services Pager: [phone] Office: [phone]

## 2020-10-24 NOTE — Progress Notes (Signed)
Progress Note    Raymond Stone   KKX:[id number]  DOB: Oct 22, 1950  DOA: 10/12/2020     12  PCP: [name], NP  CC: found down   Hospital Course: Mr. Curto admitted to the hospital with working diagnosis hypovolemic shock complicated by acute kidney injury/ATN.  70 year old male past medical history of hypertension who was found down, apparently he was not seen for about 5 days. When EMS arrived he was severely hypotensive, his temperature was 100 F he received intravenous fluids and transported to the emergency room.   Sodium 172, potassium 7.3, chloride>130,bicarb 15, glucose 156, BUN 211, creatinine 11.2, AST 118, ALT 42, total bilirubin is 1.5, troponin I 489-395, lactic acid 5.3. anion gap 49. ABG, pH 7.37, PCO2 26, PO2 96, bicarb 15 White cell count 18.7, hemoglobin 11.1, hematocrit 37.6, platelets 346. SARS COVID-19 negative. Urinalysis specific gravity 1.026, negative protein, 21-50 white cells, 0-5 red cells. Toxicology negative. Head CT negative for acute changes.  Chest radiograph no infiltrates. EKG had 19 bpm, normal axis, normal intervals, sinus rhythm, no ST segment or T wave changes.  Patient was diagnosed with shock, he received intravenous fluids and broad-spectrum antibiotic therapy. Patient underwent CRRT04/03with further improvement of his kidney function.  Peak CK 2,415 mild rhabdomyolysis likely not the culprit of renal failure.  Transferred to [facility]04/07.  Patient very debilitated, pending PT and OT evaluation, likely will need SNF for short term rehab.  Persistent tachycardia, now with fevers and worsening wbc, urine analysis negative for UTI and chest film with no infiltrates.  Start antibiotics for complex wounds infection with sepsis, 04/10.  Patient with persistent fever despite broad spectrum antibiotic therapy, getting CT abdomen and pelvis which showed no acute intra-abdominal pathology.  Interval History:   Patient laying in bed appearing chronically ill and very deconditioned/weak.  He is in no distress when seen this morning.  We had a long discussion regarding his decreased oral intake in general and that tube feeds may be necessary if ongoing poor intake.  He states he wishes to try eating better today in efforts to avoid a feeding tube but was agreeable if absolutely necessary. He does appear intermittently confused but was able to answer orientation questions appropriately to his name, year, place, president, city.  ROS: Constitutional: negative for chills and fevers, Respiratory: negative for cough, Cardiovascular: negative for chest pain and Gastrointestinal: negative for abdominal pain  Assessment & Plan: Hypovolemic shock with AKI/ ATN, uremic encephalopathy, hyperkalemia/ hypokalemia, hypernatremia, anion gap metabolic acidosis.Hypomagnesemia. -Renal function remained stable as well as blood pressure -Intermittent confusion but AAO x3 -d/c IVF on 4/14 -Resume dysphagia diet; not eating well at all (~10%)  Moderatecalorie protein malnutrition Poor oral intake - Continue with nutritional supplements, multivitamins, and thiamine. -Continue dysphagia diet; not taking in much nutrition and at risk for further decline/complications; ongoing [place] with family but for now plan is aggressive measures  - agree with trial of mirtazapine - place cortrak and will start TF (RD consulted); if no improvement in oral intake will need to further discuss PEG placement vs transitioning to more of a comfort approach  Complex wounds with NEW infection and sepsis (not present on admission). Patient with complex wounds, left hemithorax and left thigh -Last documented fever, 100.9 on 10/22/2020 at 11 AM -Continue antibiotics for now and continue following blood cultures; complete 5 day abx -Other than wounds, no other clear source  HTN. -Continue Lopressor  Etho abuse/ metabolic encephalopathy.  Patient drinks alcohol at home, On  low dose bid clonazepam and as needed lorazepam.  -Continue folic acid, thiamine, and multivitamin  Aspiration pneumonitis (present on admission).Ruled out for bacterial pneumonia.  Continue with aspiration precautions.  Old records reviewed in assessment of this patient  Antimicrobials: Zosyn 4/3 - 4/4 Rocephin 4/10 >> current Vanc 4/10 >> current   DVT prophylaxis: enoxaparin (LOVENOX) injection 40 mg Start: 10/17/20 1530 SCDs Start: 10/12/20 2213   Code Status:   Code Status: Full Code Family Communication:   Disposition Plan: Status is: Inpatient  Remains inpatient appropriate because:IV treatments appropriate due to intensity of illness or inability to take PO and Inpatient level of care appropriate due to severity of illness   Dispo: The patient is from: Home              Anticipated d/c is to: SNF              Patient currently is not medically stable to d/c.   Difficult to place patient No  Risk of unplanned readmission score: Unplanned Admission- Pilot do not use: 13.89   Objective: Blood pressure 120/65, pulse 99, temperature 97.8 F (36.6 C), temperature source Oral, resp. rate 16, height 5\' 9"  (1.753 m), weight 76.2 kg, SpO2 99 %.  Examination: General appearance: Chronically ill-appearing and globally weak/deconditioned man laying in bed Head: Normocephalic, without obvious abnormality, atraumatic Eyes: EOMI Lungs: clear to auscultation bilaterally Heart: regular rate and rhythm and S1, S2 normal Abdomen: normal findings: bowel sounds normal and soft, non-tender Extremities: edematous Skin: scattered excoriations and dry skin Neurologic: worsening LE weakness compared to UE but follows commands and moves all 4 extremities (minimal in LE)  Consultants:   PCM  Procedures:     Data Reviewed: I have personally reviewed following labs and imaging studies Results for orders placed or performed during the hospital  encounter of 10/12/20 (from the past 24 hour(s))  Glucose, capillary     Status: None   Collection Time: 10/23/20  8:25 PM  Result Value Ref Range   Glucose-Capillary 87 70 - 99 mg/dL  CBC with Differential/Platelet     Status: Abnormal   Collection Time: 10/24/20  3:22 AM  Result Value Ref Range   WBC 10.7 (H) 4.0 - 10.5 K/uL   RBC 2.44 (L) 4.22 - 5.81 MIL/uL   Hemoglobin 8.2 (L) 13.0 - 17.0 g/dL   HCT 24.7 (L) 39.0 - 52.0 %   MCV 101.2 (H) 80.0 - 100.0 fL   MCH 33.6 26.0 - 34.0 pg   MCHC 33.2 30.0 - 36.0 g/dL   RDW 13.5 11.5 - 15.5 %   Platelets 574 (H) 150 - 400 K/uL   nRBC 0.0 0.0 - 0.2 %   Neutrophils Relative % 79 %   Neutro Abs 8.4 (H) 1.7 - 7.7 K/uL   Lymphocytes Relative 10 %   Lymphs Abs 1.1 0.7 - 4.0 K/uL   Monocytes Relative 7 %   Monocytes Absolute 0.8 0.1 - 1.0 K/uL   Eosinophils Relative 3 %   Eosinophils Absolute 0.3 0.0 - 0.5 K/uL   Basophils Relative 0 %   Basophils Absolute 0.0 0.0 - 0.1 K/uL   Immature Granulocytes 1 %   Abs Immature Granulocytes 0.07 0.00 - 0.07 K/uL  Basic metabolic panel     Status: Abnormal   Collection Time: 10/24/20  3:22 AM  Result Value Ref Range   Sodium 134 (L) 135 - 145 mmol/L   Potassium 3.5 3.5 - 5.1 mmol/L   Chloride 107 98 -  111 mmol/L   CO2 18 (L) 22 - 32 mmol/L   Glucose, Bld 103 (H) 70 - 99 mg/dL   BUN 14 8 - 23 mg/dL   Creatinine, Ser 0.79 0.61 - 1.24 mg/dL   Calcium 9.6 8.9 - 10.3 mg/dL   GFR, Estimated >60 >60 mL/min   Anion gap 9 5 - 15  Magnesium     Status: Abnormal   Collection Time: 10/24/20  3:22 AM  Result Value Ref Range   Magnesium 1.6 (L) 1.7 - 2.4 mg/dL  Glucose, capillary     Status: None   Collection Time: 10/24/20  8:08 AM  Result Value Ref Range   Glucose-Capillary 88 70 - 99 mg/dL  Glucose, capillary     Status: Abnormal   Collection Time: 10/24/20 12:00 PM  Result Value Ref Range   Glucose-Capillary 109 (H) 70 - 99 mg/dL    Recent Results (from the past 240 hour(s))  Culture, blood  (routine x 2)     Status: None (Preliminary result)   Collection Time: 10/20/20  1:34 AM   Specimen: BLOOD  Result Value Ref Range Status   Specimen Description BLOOD LEFT HAND  Final   Special Requests   Final    BOTTLES DRAWN AEROBIC AND ANAEROBIC Blood Culture adequate volume   Culture   Final    NO GROWTH 4 DAYS Performed at [facility], [place] [address], [place], [place] [address]    Report Status PENDING  Incomplete  Culture, blood (routine x 2)     Status: None (Preliminary result)   Collection Time: 10/20/20  1:43 AM   Specimen: BLOOD  Result Value Ref Range Status   Specimen Description BLOOD RIGHT HAND  Final   Special Requests   Final    BOTTLES DRAWN AEROBIC AND ANAEROBIC Blood Culture adequate volume   Culture   Final    NO GROWTH 4 DAYS Performed at [facility], [place] [address] [place], [place] [address]    Report Status PENDING  Incomplete     Radiology Studies: CT ABDOMEN PELVIS W CONTRAST  Result Date: 10/23/2020 CLINICAL DATA:  Abdominal abscess EXAM: CT ABDOMEN AND PELVIS WITH CONTRAST TECHNIQUE: Multidetector CT imaging of the abdomen and pelvis was performed using the standard protocol following bolus administration of intravenous contrast. CONTRAST:  179mL OMNIPAQUE IOHEXOL 300 MG/ML  SOLN COMPARISON:  None. FINDINGS: Lower chest: Mild bibasilar atelectasis. The visualized heart and pericardium are unremarkable. Hepatobiliary: No focal liver abnormality is seen. No gallstones, gallbladder wall thickening, or biliary dilatation. Pancreas: Unremarkable Spleen: Unremarkable Adrenals/Urinary Tract: The adrenal glands are unremarkable. The kidneys are normal in size and position. Simple cortical cyst noted within the lower pole the right kidney. The kidneys are otherwise unremarkable. Gas is seen non dependently within the bladder lumen, nonspecific but possibly related to recent catheterization. The bladder is mildly distended, but is otherwise unremarkable.  Stomach/Bowel: Severe descending and sigmoid colonic diverticulosis. Scattered diverticular seen throughout the remainder of the colon. The stomach, small bowel, and large bowel are otherwise unremarkable. Appendix normal. No evidence of obstruction or focal inflammation. No free intraperitoneal gas or fluid. Vascular/Lymphatic: Minimal aortoiliac atherosclerotic calcification is present. No aortic aneurysm. No pathologic adenopathy identified within the abdomen and pelvis. Reproductive: Prostate is unremarkable. Other: Tiny fat containing umbilical hernia. Rectum unremarkable. Focal soft tissue infiltration within the subcutaneous fat of the right lower quadrant abdominal wall and associated punctate foci of gas likely related to subcutaneous injection. 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 No acute bone abnormality. Degenerative changes are seen within the lumbar spine and hips bilaterally. No lytic or blastic bone lesions are identified. IMPRESSION: No acute intra-abdominal pathology. No intra-abdominal abscess identified. Distal colonic diverticulosis. No superimposed inflammatory change identified. Small gas within the bladder lumen non dependently. Correlation for recent bladder catheterization may be helpful. Aortic Atherosclerosis (ICD10-I70.0).  In Electronically Signed   By: Fidela Salisbury MD   On: 10/23/2020 04:14   CT ABDOMEN PELVIS W CONTRAST  Final Result    DG CHEST PORT 1 VIEW  Final Result    DG CHEST PORT 1 VIEW  Final Result    US RENAL  Final Result    CT Head Wo Contrast  Final Result    DG Chest Port 1 View  Final Result      Scheduled Meds: . clonazePAM  0.5 mg Oral BID  . collagenase   Topical Daily  . enoxaparin (LOVENOX) injection  40 mg Subcutaneous Q24H  . feeding supplement  237 mL Oral TID WC & HS  . mouth rinse  15 mL Mouth Rinse BID  . metoprolol tartrate  5 mg Intravenous Once  . metoprolol tartrate  25 mg  Oral BID  . mirtazapine  15 mg Oral QHS  . multivitamin with minerals  1 tablet Oral Daily  . senna  1 tablet Oral QHS   PRN Meds: sodium chloride, acetaminophen, acetaminophen, docusate sodium, lip balm, LORazepam, ondansetron (ZOFRAN) IV, polyethylene glycol, polyvinyl alcohol Continuous Infusions: . sodium chloride 1,000 mL (10/21/20 1514)  . cefTRIAXone (ROCEPHIN)  IV Stopped (10/24/20 1008)  . dextrose 5 % and 0.45% NaCl 75 mL/hr at 10/24/20 1600  . folic acid (FOLVITE) IVPB 1 mg (10/24/20 0959)  . thiamine injection Stopped (10/24/20 1018)  . vancomycin 1,000 mg (10/24/20 1448)     LOS: 12 days  Time spent: Greater than 50% of the 35 minute visit was spent in counseling/coordination of care for the patient as laid out in the A&P.   Dwyane Dee, MD [facility] 10/24/2020, 4:38 PM

## 2020-10-24 NOTE — Progress Notes (Signed)
Daily Progress Note   Patient Name: Raymond Stone       Date: 10/24/2020 DOB: 1950-12-29  Age: 70 y.o. MRN#: [id number] Attending Physician: Dwyane Dee, MD Primary Care Physician: [name], NP Admit Date: 10/12/2020  Reason for Consultation/Follow-up: Establishing goals of care  Subjective: Patient wakes easily to voice but quickly goes back to sleep. Tells me he feels "okay". Tells me he has been eating though from chart review seems this is very minimal.   Length of Stay: 12  Current Medications: Scheduled Meds:  . clonazePAM  0.5 mg Oral BID  . collagenase   Topical Daily  . enoxaparin (LOVENOX) injection  40 mg Subcutaneous Q24H  . feeding supplement  237 mL Oral TID WC & HS  . mouth rinse  15 mL Mouth Rinse BID  . metoprolol tartrate  5 mg Intravenous Once  . metoprolol tartrate  25 mg Oral BID  . mirtazapine  15 mg Oral QHS  . multivitamin with minerals  1 tablet Oral Daily  . senna  1 tablet Oral QHS    Continuous Infusions: . sodium chloride 1,000 mL (10/21/20 1514)  . cefTRIAXone (ROCEPHIN)  IV 2 g (10/24/20 0932)  . dextrose 5 % and 0.45% NaCl 75 mL/hr at 10/24/20 0431  . folic acid (FOLVITE) IVPB 1 mg (10/24/20 0959)  . thiamine injection 250 mg (10/24/20 0948)  . vancomycin      PRN Meds: sodium chloride, acetaminophen, acetaminophen, docusate sodium, lip balm, LORazepam, ondansetron (ZOFRAN) IV, polyethylene glycol, polyvinyl alcohol  Physical Exam Constitutional:      General: He is not in acute distress.    Comments: lethargic  Pulmonary:     Effort: Pulmonary effort is normal. No respiratory distress.  Skin:    General: Skin is warm and dry.  Neurological:     Comments: Somewhat oriented, easily confused             Vital Signs: BP 133/70 (BP  Location: Right Arm)   Pulse 99   Temp 98.5 F (36.9 C) (Oral)   Resp 16   Ht 5\' 9"  (1.753 m)   Wt 76.2 kg   SpO2 98%   BMI 24.81 kg/m  SpO2: SpO2: 98 % O2 Device: O2 Device: Room Air O2 Flow Rate: O2 Flow Rate (L/min): 1 L/min  Intake/output summary:   Intake/Output Summary (Last 24 hours) at 10/24/2020 1137 Last data filed at 10/24/2020 0600 Gross per 24 hour  Intake 1236.81 ml  Output --  Net 1236.81 ml   LBM: Last BM Date: 10/18/20 Baseline Weight: Weight: 87.9 kg (from 2021 records) Most recent weight: Weight: 76.2 kg       Palliative Assessment/Data: PPS 20%    Flowsheet Rows   Flowsheet Row Most Recent Value  Intake Tab   Referral Department Hospitalist  Unit at Time of Referral Intermediate Care Unit  Palliative Care Primary Diagnosis Trauma  Date Notified 10/21/20  Palliative Care Type New Palliative care  Reason for referral Clarify Goals of Care  Date of Admission 10/12/20  Date first seen by Palliative Care 10/23/20  # of days Palliative referral response time 2 Day(s)  # of days IP prior to Palliative referral 9  Clinical Assessment   Palliative Performance Scale Score 20%  Psychosocial & Spiritual Assessment   Palliative Care Outcomes   Patient/Family meeting held? Yes  Who was at the meeting? dtr, sister, patient  Palliative Care Outcomes Clarified goals of care, Provided psychosocial or spiritual support      Patient Active Problem List   Diagnosis Date Noted  . Hypernatremia 10/18/2020  . Hyperkalemia 10/18/2020  . Acute metabolic encephalopathy 85/88/5027  . Hypovolemic shock ([place]) 10/18/2020  . ATN (acute tubular necrosis) ([place]) 10/18/2020  . Malnutrition of moderate degree 10/17/2020  . Pressure injury of skin 10/13/2020  . Acute respiratory failure with hypoxia ([place]) 11/24/2018  . Angioedema 05/22/2017  . 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 Endotracheally intubated   . History of  alcohol dependence ([place]) 05/17/2017  . Leukocytosis 01/27/2017  . AKI (acute kidney injury) ([place]) 01/24/2017  . 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 Alcohol dependence ([place]) 01/24/2017  . Normocytic anemia 01/24/2017  . Thrombocytosis 01/24/2017  . Chronic diastolic CHF (congestive heart failure) ([place]) 01/24/2017  . Scrotal edema 10/20/2016  . Nephrotic syndrome 10/20/2016  . Essential hypertension 01/07/2016  . Severe eczema 04/16/2014    Palliative Care Assessment & Plan   HPI: 70 y.o. male  with past medical history of HTN and ETOH abuse admitted on 10/12/2020 after being found down at home - had not been seen for 5 days. He was diagnosed with shock and received IV fluids and antibiotics. Did require CRRT d/t AKI. Renal function now improved but patient remains confused, refusing meds and PO intake. New fevers 4/10 and antibiotics restarted - concern for wound infection. PMT consulted to discuss [place].    Assessment: F/u with patient today. No significant changes. Poor PO intake continues.  Discussed with Dr. Sabino Gasser - per sister's report patient with significant hx of depression. Discussed trial of mirtazipine 15 mg qhs. Considering initiating tube feeding.  Spoke with patient's daughter [name] - discussed above. She is agreeable to plan. No BM since 4/8 - will start senna   Recommendations/Plan: Trial of 15 mg mirtazipine qhs Senna QHS Family updated - GOC difficult as patient unable to discuss at this point and family unsure of his wishes, patient did seem to confirm full code status though this is likely not reliable Will follow  Code Status: Full code  Discharge Planning: [facility] for rehab with Palliative care service follow-up  Care plan was discussed with Dr. Sabino Gasser, daughter [name]  Thank you for allowing the Palliative Medicine Team to assist in the care of this patient.   Total Time 30 minutes Prolonged Time Billed  no       Greater than 50%  of this time  was spent counseling and coordinating care related to the above assessment and plan.  Juel Burrow, DNP, [facility] Palliative Medicine Team Team Phone # [phone]  Pager [phone]

## 2020-10-24 NOTE — Progress Notes (Signed)
  Speech Language Pathology Treatment: Dysphagia  Patient Details Name: Raymond Stone MRN: [id number] DOB: [birth year] Today's Date: 10/24/2020 Time: 0852-0900 SLP Time Calculation (min) (ACUTE ONLY): 8 min  Assessment / Plan / Recommendation Clinical Impression  Pt was asleep upon entering the room. He required encouragement from SLP for limited PO intake and stated that he wanted to take a nap. He was agreeable to sips of water and small bites of a single graham cracker. Prolonged mastication was noted with solids; however, his oral phase still appears functional and no oral residue was observed. No overt s/s of aspiration were noted during these trials even with pt sitting partially reclined. SLP will continue to follow for potential upgraded solid textures. In the interim, recommend continuation of his current DYS 2 diet with thin liquids, and full assistance for feeding.    HPI HPI: 70 yo man with hx of HTN, lives alone, found down after not being seen for five days and admitted 4/2. Dx acute metabolic encephalopathy, acute kidney injury, hypovolemic shock, rhabdomylosis. BSE completed on 4/8 and after diet check by SLP, patient was discharged from speech services because of tolerating regular solids, thin liquids. BSE reordered on 4/11 as nursing observing patient to cough frequently with even smal sips of water.      SLP Plan  Continue with current plan of care       Recommendations  Diet recommendations: Dysphagia 2 (fine chop);Thin liquid Liquids provided via: Cup;Straw Medication Administration: Whole meds with liquid Supervision: Full supervision/cueing for compensatory strategies;Staff to assist with self feeding Compensations: Slow rate;Small sips/bites Postural Changes and/or Swallow Maneuvers: Seated upright 90 degrees                Oral Care Recommendations: Oral care BID Follow up Recommendations: Skilled Nursing facility;24 hour supervision/assistance SLP Visit  Diagnosis: Dysphagia, unspecified (R13.10) Plan: Continue with current plan of care       GO                Jeanine Luz., SLP Student 10/24/2020, 10:30 AM

## 2020-10-25 DIAGNOSIS — Z515 Encounter for palliative care: Secondary | ICD-10-CM | POA: Diagnosis not present

## 2020-10-25 DIAGNOSIS — R571 Hypovolemic shock: Secondary | ICD-10-CM | POA: Diagnosis not present

## 2020-10-25 DIAGNOSIS — Z7189 Other specified counseling: Secondary | ICD-10-CM | POA: Diagnosis not present

## 2020-10-25 DIAGNOSIS — G9341 Metabolic encephalopathy: Secondary | ICD-10-CM | POA: Diagnosis not present

## 2020-10-25 DIAGNOSIS — E440 Moderate protein-calorie malnutrition: Secondary | ICD-10-CM | POA: Diagnosis not present

## 2020-10-25 LAB — CBC WITH DIFFERENTIAL/PLATELET
Abs Immature Granulocytes: 0.14 10*3/uL — ABNORMAL HIGH (ref 0.00–0.07)
Basophils Absolute: 0 10*3/uL (ref 0.0–0.1)
Basophils Relative: 0 %
Eosinophils Absolute: 0.3 10*3/uL (ref 0.0–0.5)
Eosinophils Relative: 3 %
HCT: 24.1 % — ABNORMAL LOW (ref 39.0–52.0)
Hemoglobin: 7.9 g/dL — ABNORMAL LOW (ref 13.0–17.0)
Immature Granulocytes: 1 %
Lymphocytes Relative: 12 %
Lymphs Abs: 1.4 10*3/uL (ref 0.7–4.0)
MCH: 32.5 pg (ref 26.0–34.0)
MCHC: 32.8 g/dL (ref 30.0–36.0)
MCV: 99.2 fL (ref 80.0–100.0)
Monocytes Absolute: 1.1 10*3/uL — ABNORMAL HIGH (ref 0.1–1.0)
Monocytes Relative: 9 %
Neutro Abs: 8.7 10*3/uL — ABNORMAL HIGH (ref 1.7–7.7)
Neutrophils Relative %: 75 %
Platelets: 557 10*3/uL — ABNORMAL HIGH (ref 150–400)
RBC: 2.43 MIL/uL — ABNORMAL LOW (ref 4.22–5.81)
RDW: 13.5 % (ref 11.5–15.5)
WBC: 11.7 10*3/uL — ABNORMAL HIGH (ref 4.0–10.5)
nRBC: 0 % (ref 0.0–0.2)

## 2020-10-25 LAB — CULTURE, BLOOD (ROUTINE X 2)
Culture: NO GROWTH
Culture: NO GROWTH
Special Requests: ADEQUATE
Special Requests: ADEQUATE

## 2020-10-25 LAB — BASIC METABOLIC PANEL
Anion gap: 8 (ref 5–15)
BUN: 13 mg/dL (ref 8–23)
CO2: 21 mmol/L — ABNORMAL LOW (ref 22–32)
Calcium: 10.3 mg/dL (ref 8.9–10.3)
Chloride: 106 mmol/L (ref 98–111)
Creatinine, Ser: 0.69 mg/dL (ref 0.61–1.24)
GFR, Estimated: >60 mL/min (ref >60)
Glucose, Bld: 96 mg/dL (ref 70–99)
Potassium: 3.8 mmol/L (ref 3.5–5.1)
Sodium: 135 mmol/L (ref 135–145)

## 2020-10-25 LAB — PREALBUMIN: Prealbumin: 5.5 mg/dL — ABNORMAL LOW (ref 18–38)

## 2020-10-25 LAB — GLUCOSE, CAPILLARY
Glucose-Capillary: 104 mg/dL — ABNORMAL HIGH (ref 70–99)
Glucose-Capillary: 83 mg/dL (ref 70–99)
Glucose-Capillary: 95 mg/dL (ref 70–99)

## 2020-10-25 LAB — MAGNESIUM: Magnesium: 1.6 mg/dL — ABNORMAL LOW (ref 1.7–2.4)

## 2020-10-25 MED ORDER — THIAMINE HCL 100 MG PO TABS
100.0000 mg | ORAL_TABLET | Freq: Every day | ORAL | Status: DC
  Administered 2020-10-25: 100 mg via ORAL
  Filled 2020-10-25: qty 1

## 2020-10-25 MED ORDER — PROSOURCE TF PO LIQD
45.0000 mL | Freq: Two times a day (BID) | ORAL | Status: DC
  Administered 2020-10-25 – 2020-11-06 (×24): 45 mL
  Filled 2020-10-25 (×24): qty 45

## 2020-10-25 MED ORDER — POLYETHYLENE GLYCOL 3350 17 G PO PACK
17.0000 g | PACK | Freq: Every day | ORAL | Status: DC | PRN
  Administered 2020-10-27: 17 g
  Filled 2020-10-25: qty 1

## 2020-10-25 MED ORDER — ADULT MULTIVITAMIN LIQUID CH
15.0000 mL | Freq: Every day | ORAL | Status: DC
  Administered 2020-10-26 – 2020-11-06 (×12): 15 mL
  Filled 2020-10-25 (×12): qty 15

## 2020-10-25 MED ORDER — OSMOLITE 1.5 CAL PO LIQD
1000.0000 mL | ORAL | Status: DC
  Administered 2020-10-25 – 2020-11-04 (×10): 1000 mL
  Filled 2020-10-25 (×20): qty 1000

## 2020-10-25 MED ORDER — LORAZEPAM 1 MG PO TABS
1.0000 mg | ORAL_TABLET | ORAL | Status: DC | PRN
  Administered 2020-11-03: 1 mg
  Filled 2020-10-25: qty 1

## 2020-10-25 MED ORDER — ACETAMINOPHEN 160 MG/5ML PO SOLN
650.0000 mg | ORAL | Status: DC | PRN
  Administered 2020-10-26 – 2020-11-05 (×20): 650 mg
  Filled 2020-10-25 (×21): qty 20.3

## 2020-10-25 MED ORDER — THIAMINE HCL 100 MG PO TABS
100.0000 mg | ORAL_TABLET | Freq: Every day | ORAL | Status: DC
  Administered 2020-10-26 – 2020-11-06 (×12): 100 mg
  Filled 2020-10-25 (×12): qty 1

## 2020-10-25 MED ORDER — METOPROLOL TARTRATE 25 MG/10 ML ORAL SUSPENSION
25.0000 mg | Freq: Two times a day (BID) | ORAL | Status: DC
  Administered 2020-10-25 – 2020-10-26 (×2): 25 mg
  Filled 2020-10-25 (×3): qty 10

## 2020-10-25 MED ORDER — SENNOSIDES 8.8 MG/5ML PO SYRP
5.0000 mL | ORAL_SOLUTION | Freq: Every day | ORAL | Status: DC
  Administered 2020-10-26 – 2020-11-04 (×3): 5 mL
  Filled 2020-10-25 (×12): qty 5

## 2020-10-25 MED ORDER — MAGNESIUM OXIDE 400 (241.3 MG) MG PO TABS
400.0000 mg | ORAL_TABLET | Freq: Two times a day (BID) | ORAL | Status: DC
  Administered 2020-10-25 – 2020-11-06 (×24): 400 mg
  Filled 2020-10-25 (×24): qty 1

## 2020-10-25 MED ORDER — MAGNESIUM OXIDE 400 (241.3 MG) MG PO TABS
400.0000 mg | ORAL_TABLET | Freq: Two times a day (BID) | ORAL | Status: DC
  Administered 2020-10-25: 400 mg via ORAL
  Filled 2020-10-25: qty 1

## 2020-10-25 MED ORDER — FOLIC ACID 1 MG PO TABS
1.0000 mg | ORAL_TABLET | Freq: Every day | ORAL | Status: DC
  Administered 2020-10-25: 1 mg via ORAL
  Filled 2020-10-25: qty 1

## 2020-10-25 MED ORDER — FOLIC ACID 1 MG PO TABS
1.0000 mg | ORAL_TABLET | Freq: Every day | ORAL | Status: DC
  Administered 2020-10-26 – 2020-11-06 (×12): 1 mg
  Filled 2020-10-25 (×12): qty 1

## 2020-10-25 NOTE — Progress Notes (Signed)
Progress Note    Raymond Stone   CHE:[id number]  DOB: [birth year]  DOA: 10/12/2020     13  PCP: [name], NP  CC: found down   Hospital Course: Raymond Stone admitted to the hospital with working diagnosis hypovolemic shock complicated by acute kidney injury/ATN.  70 year old male past medical history of hypertension who was found down, apparently he was not seen for about 5 days. When EMS arrived he was severely hypotensive, his temperature was 100 F he received intravenous fluids and transported to the emergency room.   Sodium 172, potassium 7.3, chloride>130,bicarb 15, glucose 156, BUN 211, creatinine 11.2, AST 118, ALT 42, total bilirubin is 1.5, troponin I 489-395, lactic acid 5.3. anion gap 49. ABG, pH 7.37, PCO2 26, PO2 96, bicarb 15 White cell count 18.7, hemoglobin 11.1, hematocrit 37.6, platelets 346. SARS COVID-19 negative. Urinalysis specific gravity 1.026, negative protein, 21-50 white cells, 0-5 red cells. Toxicology negative. Head CT negative for acute changes.  Chest radiograph no infiltrates. EKG had 19 bpm, normal axis, normal intervals, sinus rhythm, no ST segment or T wave changes.  Patient was diagnosed with shock, he received intravenous fluids and broad-spectrum antibiotic therapy. Patient underwent CRRT04/03with further improvement of his kidney function.  Peak CK 2,415 mild rhabdomyolysis likely not the culprit of renal failure.  Transferred to [facility]04/07.  Patient very debilitated, pending PT and OT evaluation, likely will need SNF for short term rehab.  Persistent tachycardia, now with fevers and worsening wbc, urine analysis negative for UTI and chest film with no infiltrates.  Start antibiotics for complex wounds infection with sepsis, 04/10.  Patient with persistent fever despite broad spectrum antibiotic therapy, getting CT abdomen and pelvis which showed no acute intra-abdominal pathology.  Due to ongoing poor  oral intake, we discussed need for at least temporary tube feeding.  Patient was amenable.  Coretrak was placed on 10/25/2020 and tube feeds initiated.  Interval History:  No events overnight.  Still appears confused at times however remains alert and oriented x3.  Still amenable for tube feeds being initiated through [place]. Still globally weak/deconditioned but otherwise no distress in bed.  ROS: Constitutional: negative for chills and fevers, Respiratory: negative for cough, Cardiovascular: negative for chest pain and Gastrointestinal: negative for abdominal pain  Assessment & Plan: Hypovolemic shock with AKI/ATN [place]ic encephalopathy, hyperkalemia/ hypokalemia, hypernatremia, anion gap metabolic acidosis.Hypomagnesemia. -Renal function remained stable as well as blood pressure -Intermittent confusion but AAO x3 -d/c IVF on 4/14 -Resume dysphagia diet; not eating well at all (~10%)  Moderatecalorie protein malnutrition Poor oral intake - Continue with nutritional supplements, multivitamins, and thiamine. -Continue dysphagia diet; not taking in much nutrition and at risk for further decline/complications; ongoing [place] with family but for now plan is aggressive measures  - agree with trial of mirtazapine - Cortrak placed on 4/15 and TF initiated; if unable to increase oral intake after some strengthening from TF then will need to discuss PEG vs comfort care   Complex wounds with NEW infection and sepsis (not present on admission). Patient with complex wounds, left hemithorax and left thigh -Last documented fever, 100.9 on 10/22/2020 at 11 AM -Continue antibiotics for now and continue following blood cultures; complete 5 day abx -Other than wounds, no other clear source  HTN. -Continue Lopressor  Etho abuse/ metabolic encephalopathy. Patient drinks alcohol at home, On low dose bid clonazepam and as needed lorazepam.  -Continue folic acid, thiamine, and  multivitamin  Aspiration pneumonitis (present on admission).Ruled out for bacterial pneumonia.  Continue with aspiration precautions.  Old records reviewed in assessment of this patient  Antimicrobials: Zosyn 4/3 - 4/4 Rocephin 4/10 >> 4/15 Vanc 4/10 >> 4/15  DVT prophylaxis: enoxaparin (LOVENOX) injection 40 mg Start: 10/17/20 1530 SCDs Start: 10/12/20 2213   Code Status:   Code Status: Full Code Family Communication:   Disposition Plan: Status is: Inpatient  Remains inpatient appropriate because:IV treatments appropriate due to intensity of illness or inability to take PO and Inpatient level of care appropriate due to severity of illness   Dispo: The patient is from: Home              Anticipated d/c is to: SNF              Patient currently is not medically stable to d/c.   Difficult to place patient No  Risk of unplanned readmission score: Unplanned Admission- Pilot do not use: 13.8   Objective: Blood pressure (!) 153/81, pulse (!) 105, temperature 97.6 F (36.4 C), temperature source Axillary, resp. rate 16, height 5\' 9"  (1.753 m), weight 75.8 kg, SpO2 100 %.  Examination: General appearance: Chronically ill-appearing and globally weak/deconditioned man laying in bed Head: Normocephalic, without obvious abnormality, atraumatic Eyes: EOMI Lungs: clear to auscultation bilaterally Heart: regular rate and rhythm and S1, S2 normal Abdomen: normal findings: bowel sounds normal and soft, non-tender Extremities: edematous Skin: scattered excoriations and dry skin Neurologic: worsening LE weakness compared to UE but follows commands and moves all 4 extremities (minimal in LE)  Consultants:   PCM  Procedures:     Data Reviewed: I have personally reviewed following labs and imaging studies Results for orders placed or performed during the hospital encounter of 10/12/20 (from the past 24 hour(s))  Glucose, capillary     Status: Abnormal   Collection Time:  10/24/20  4:42 PM  Result Value Ref Range   Glucose-Capillary 108 (H) 70 - 99 mg/dL  Glucose, capillary     Status: Abnormal   Collection Time: 10/24/20  9:21 PM  Result Value Ref Range   Glucose-Capillary 100 (H) 70 - 99 mg/dL  CBC with Differential/Platelet     Status: Abnormal   Collection Time: 10/25/20  3:46 AM  Result Value Ref Range   WBC 11.7 (H) 4.0 - 10.5 K/uL   RBC 2.43 (L) 4.22 - 5.81 MIL/uL   Hemoglobin 7.9 (L) 13.0 - 17.0 g/dL   HCT 24.1 (L) 39.0 - 52.0 %   MCV 99.2 80.0 - 100.0 fL   MCH 32.5 26.0 - 34.0 pg   MCHC 32.8 30.0 - 36.0 g/dL   RDW 13.5 11.5 - 15.5 %   Platelets 557 (H) 150 - 400 K/uL   nRBC 0.0 0.0 - 0.2 %   Neutrophils Relative % 75 %   Neutro Abs 8.7 (H) 1.7 - 7.7 K/uL   Lymphocytes Relative 12 %   Lymphs Abs 1.4 0.7 - 4.0 K/uL   Monocytes Relative 9 %   Monocytes Absolute 1.1 (H) 0.1 - 1.0 K/uL   Eosinophils Relative 3 %   Eosinophils Absolute 0.3 0.0 - 0.5 K/uL   Basophils Relative 0 %   Basophils Absolute 0.0 0.0 - 0.1 K/uL   Immature Granulocytes 1 %   Abs Immature Granulocytes 0.14 (H) 0.00 - 0.07 K/uL  Basic metabolic panel     Status: Abnormal   Collection Time: 10/25/20  3:46 AM  Result Value Ref Range   Sodium 135 135 - 145 mmol/L   Potassium 3.8 3.5 -  5.1 mmol/L   Chloride 106 98 - 111 mmol/L   CO2 21 (L) 22 - 32 mmol/L   Glucose, Bld 96 70 - 99 mg/dL   BUN 13 8 - 23 mg/dL   Creatinine, Ser 0.69 0.61 - 1.24 mg/dL   Calcium 10.3 8.9 - 10.3 mg/dL   GFR, Estimated >60 >60 mL/min   Anion gap 8 5 - 15  Magnesium     Status: Abnormal   Collection Time: 10/25/20  3:46 AM  Result Value Ref Range   Magnesium 1.6 (L) 1.7 - 2.4 mg/dL  LAB REPORT - SCANNED     Status: None   Collection Time: 10/25/20  9:58 AM   Narrative   Ordered by an unspecified provider.  Glucose, capillary     Status: None   Collection Time: 10/25/20  4:08 PM  Result Value Ref Range   Glucose-Capillary 83 70 - 99 mg/dL    Recent Results (from the past 240  hour(s))  Culture, blood (routine x 2)     Status: None   Collection Time: 10/20/20  1:34 AM   Specimen: BLOOD  Result Value Ref Range Status   Specimen Description BLOOD LEFT HAND  Final   Special Requests   Final    BOTTLES DRAWN AEROBIC AND ANAEROBIC Blood Culture adequate volume   Culture   Final    NO GROWTH 5 DAYS Performed at [facility], [place] [address], [place], [place] [address]    Report Status 10/25/2020 FINAL  Final  Culture, blood (routine x 2)     Status: None   Collection Time: 10/20/20  1:43 AM   Specimen: BLOOD  Result Value Ref Range Status   Specimen Description BLOOD RIGHT HAND  Final   Special Requests   Final    BOTTLES DRAWN AEROBIC AND ANAEROBIC Blood Culture adequate volume   Culture   Final    NO GROWTH 5 DAYS Performed at [facility], [place] [address], [place], [place] [address]    Report Status 10/25/2020 FINAL  Final     Radiology Studies: No results found. CT ABDOMEN PELVIS W CONTRAST  Final Result    DG CHEST PORT 1 VIEW  Final Result    DG CHEST PORT 1 VIEW  Final Result    US RENAL  Final Result    CT Head Wo Contrast  Final Result    DG Chest Port 1 View  Final Result      Scheduled Meds: . collagenase   Topical Daily  . enoxaparin (LOVENOX) injection  40 mg Subcutaneous Q24H  . feeding supplement  237 mL Oral TID WC & HS  . feeding supplement (PROSource TF)  45 mL Per Tube BID  . folic acid  1 mg Oral Daily  . magnesium oxide  400 mg Oral BID  . mouth rinse  15 mL Mouth Rinse BID  . metoprolol tartrate  25 mg Oral BID  . mirtazapine  15 mg Oral QHS  . multivitamin with minerals  1 tablet Oral Daily  . senna  1 tablet Oral QHS  . thiamine  100 mg Oral Daily   PRN Meds: sodium chloride, acetaminophen, acetaminophen, docusate sodium, lip balm, LORazepam, ondansetron (ZOFRAN) IV, polyethylene glycol, polyvinyl alcohol Continuous Infusions: . sodium chloride 1,000 mL (10/21/20 1514)  . feeding supplement  (OSMOLITE 1.5 CAL)       LOS: 13 days  Time spent: Greater than 50% of the 35 minute visit was spent in  counseling/coordination of care for the patient as laid out in the A&P.   Dwyane Dee, MD [facility] 10/25/2020, 4:16 PM

## 2020-10-25 NOTE — Progress Notes (Signed)
Nutrition Follow-up  DOCUMENTATION CODES:  Non-severe (moderate) malnutrition in context of chronic illness  INTERVENTION:   Recommend Osmolite 1.5 cal formula at 25 ml/hr and increase by 10 ml every 6 hours to goal rate of 65 ml/hr. Recommend 45 ml Prosource TF BID per tube. Tube feeding regimen to provide 2420 kcal, 120 grams of protein, and 1186 ml free water.    At risk for refeeding, monitor Mg and phosphorus for the next 48 hours.   Provide Ensure Enlive po TID as pt can tolerate, each supplement provides 350 kcal and 20 grams of protein.   ContinueMagic cup TID with meals, each supplement provides 290 kcal and 9 grams of protein   Continue MVI with Minerals.  NUTRITION DIAGNOSIS:  Moderate Malnutrition related to chronic illness (CHF) as evidenced by mild fat depletion,moderate muscle depletion.  GOAL:  Patient will meet greater than or equal to 90% of their needs  MONITOR:  PO intake,Supplement acceptance,Weight trends,Labs,I & O's,Diet advancement,Skin  REASON FOR ASSESSMENT:  Consult Enteral/tube feeding initiation and management  ASSESSMENT:  Patient with PMH significant for HTN, peripheral edema, CHF, and alcohol use disorder. Presents this admission with suspected uremic encephalopathy and AKI.   Pt resting in bed at the time of assessment, did not respond to nutrition questions. Briefly opened eyes, but then closed them again. Palliative care consulting. Care team spoke with pt and family about the need to increase nutrition. Noted 0-10% intake of meals this week. MD requests cortrak tube be placed and EN be initiated. RN reports pt did not eat breakfast and has not had any of his ensures today.  4/3- start CRRT 4/4- stop CRRT due to pressure alarming 4/6 - HD Cath removed   Relevant Scheduled Meds: . magnesium oxide  400 mg Oral BID  . mirtazapine  15 mg Oral QHS  . multivitamin with minerals  1 tablet Oral Daily  . senna  1 tablet Oral QHS   Relevant  Continuous Infusions: . folic acid (FOLVITE) IVPB 1 mg (10/24/20 0959)  . thiamine injection Stopped (10/24/20 1018)   Relevant PRN Meds (none being administered regularly at this time): docusate sodium, ondansetron (ZOFRAN) IV, polyethylene glycol   NUTRITION - FOCUSED PHYSICAL EXAM: Flowsheet Row Most Recent Value  Orbital Region Mild depletion  Upper Arm Region Mild depletion  Thoracic and Lumbar Region Unable to assess  Buccal Region Mild depletion  Temple Region Moderate depletion  Clavicle Bone Region Moderate depletion  Clavicle and Acromion Bone Region Moderate depletion  Scapular Bone Region Unable to assess  Dorsal Hand Mild depletion  Patellar Region Moderate depletion  Anterior Thigh Region Moderate depletion  Posterior Calf Region Moderate depletion  Edema (RD Assessment) Moderate  [generalized]  Hair Reviewed  Eyes Unable to assess  Mouth Unable to assess  Skin Reviewed  Nails Reviewed     Diet Order:   Diet Order            DIET DYS 2 Room service appropriate? Yes; Fluid consistency: Thin  Diet effective now                EDUCATION NEEDS:  Not appropriate for education at this time  Skin:  Skin Assessment: Skin Integrity Issues: Skin Integrity Issues:: Unstageable,DTI DTI: L elbow, R toe, bilateral ankles, bilateral knees Stage I: N/A Stage II: N/A Unstageable: L thigh, L flank Other: skin tear scrotum  Last BM:  4/14 per RN documentation  Height:  Ht Readings from Last 1 Encounters:  10/13/20 5'  9" (1.753 m)   Weight:  Wt Readings from Last 1 Encounters:  10/25/20 75.8 kg   IBW: 72.7 kg   BMI:  Body mass index is 24.66 kg/m.  Estimated Nutritional Needs:   Kcal:  2300-2500 kcal  Protein:  115-130 grams  Fluid:  >/= 2 L/day  Ranell Patrick, RD, LDN Clinical Dietitian Pager on [place]

## 2020-10-25 NOTE — Progress Notes (Signed)
Daily Progress Note   Patient Name: Raymond Stone       Date: 10/25/2020 DOB: 09-13-1950  Age: 70 y.o. MRN#: [id number] Attending Physician: Dwyane Dee, MD Primary Care Physician: [name], NP Admit Date: 10/12/2020  Reason for Consultation/Follow-up: Establishing goals of care  Subjective: Not as interactive today. Wakes to voice but quickly back to sleep. Pending temporary feeding tube placement.   Length of Stay: 13  Current Medications: Scheduled Meds:  . collagenase   Topical Daily  . enoxaparin (LOVENOX) injection  40 mg Subcutaneous Q24H  . feeding supplement  237 mL Oral TID WC & HS  . folic acid  1 mg Oral Daily  . magnesium oxide  400 mg Oral BID  . mouth rinse  15 mL Mouth Rinse BID  . metoprolol tartrate  25 mg Oral BID  . mirtazapine  15 mg Oral QHS  . multivitamin with minerals  1 tablet Oral Daily  . senna  1 tablet Oral QHS  . thiamine  100 mg Oral Daily    Continuous Infusions: . sodium chloride 1,000 mL (10/21/20 1514)    PRN Meds: sodium chloride, acetaminophen, acetaminophen, docusate sodium, lip balm, LORazepam, ondansetron (ZOFRAN) IV, polyethylene glycol, polyvinyl alcohol  Physical Exam Constitutional:      General: He is not in acute distress.    Comments: lethargic  Pulmonary:     Effort: Pulmonary effort is normal. No respiratory distress.  Skin:    General: Skin is warm and dry.  Neurological:     Comments: Minimally interactive             Vital Signs: BP (!) 154/77 (BP Location: Right Arm)   Pulse 100   Temp 98.7 F (37.1 C) (Axillary)   Resp 17   Ht 5\' 9"  (1.753 m)   Wt 75.8 kg   SpO2 100%   BMI 24.66 kg/m  SpO2: SpO2: 100 % O2 Device: O2 Device: Room Air O2 Flow Rate: O2 Flow Rate (L/min): 1 L/min  Intake/output summary:    Intake/Output Summary (Last 24 hours) at 10/25/2020 1331 Last data filed at 10/25/2020 0348 Gross per 24 hour  Intake 807.5 ml  Output 500 ml  Net 307.5 ml   LBM: Last BM Date: 10/24/20 Baseline Weight: Weight: 87.9 kg (from 2021 records) Most recent weight: Weight: 75.8 kg       Palliative Assessment/Data: PPS 20%    Flowsheet Rows   Flowsheet Row Most Recent Value  Intake Tab   Referral Department Hospitalist  Unit at Time of Referral Intermediate Care Unit  Palliative Care Primary Diagnosis Trauma  Date Notified 10/21/20  Palliative Care Type New Palliative care  Reason for referral Clarify Goals of Care  Date of Admission 10/12/20  Date first seen by Palliative Care 10/23/20  # of days Palliative referral response time 2 Day(s)  # of days IP prior to Palliative referral 9  Clinical Assessment   Palliative Performance Scale Score 20%  Psychosocial & Spiritual Assessment   Palliative Care Outcomes   Patient/Family meeting held? Yes  Who was at the meeting? dtr, sister, patient  Palliative Care Outcomes Clarified goals of care, Provided psychosocial or spiritual support  Patient Active Problem List   Diagnosis Date Noted  . Hypernatremia 10/18/2020  . Hyperkalemia 10/18/2020  . Acute metabolic encephalopathy 88/50/2774  . Hypovolemic shock ([place]) 10/18/2020  . ATN (acute tubular necrosis) ([place]) 10/18/2020  . Malnutrition of moderate degree 10/17/2020  . Pressure injury of skin 10/13/2020  . Acute respiratory failure with hypoxia ([place]) 11/24/2018  . Angioedema 05/22/2017  . 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 Endotracheally intubated   . History of alcohol dependence ([place]) 05/17/2017  . Leukocytosis 01/27/2017  . AKI (acute kidney injury) ([place]) 01/24/2017  . 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 Alcohol dependence ([place]) 01/24/2017  . Normocytic anemia 01/24/2017  . Thrombocytosis 01/24/2017  . Chronic diastolic  CHF (congestive heart failure) ([place]) 01/24/2017  . Scrotal edema 10/20/2016  . Nephrotic syndrome 10/20/2016  . Essential hypertension 01/07/2016  . Severe eczema 04/16/2014    Palliative Care Assessment & Plan   HPI: 70 y.o. male  with past medical history of HTN and ETOH abuse admitted on 10/12/2020 after being found down at home - had not been seen for 5 days. He was diagnosed with shock and received IV fluids and antibiotics. Did require CRRT d/t AKI. Renal function now improved but patient remains confused, refusing meds and PO intake. New fevers 4/10 and antibiotics restarted - concern for wound infection. PMT consulted to discuss [place].    Assessment: F/u with patient today. No significant changes. Poor PO intake continues.  Discussed with Dr. Sabino Gasser - will dc clonazepam. Pending temporary feeding tube insertion.  Will monitor mental status - may need to dc mirtazipine ? Diarrhea overnight.   Recommendations/Plan: Trial of 15 mg mirtazipine qhs - monitor MS, may need to stop, more sedated today Dc clonazepam Senna QHS - possibly having diarrhea overnight but not documented? Will follow  Code Status: Full code  Discharge Planning: [place] for rehab with Palliative care service follow-up  Care plan was discussed with Dr. Sabino Gasser, RN  Thank you for allowing the Palliative Medicine Team to assist in the care of this patient.   Total Time 20 minutes Prolonged Time Billed  no    Greater than 50%  of this time was spent counseling and coordinating care related to the above assessment and plan.  Juel Burrow, DNP, [facility] Palliative Medicine Team Team Phone # [phone]  Pager [phone]

## 2020-10-25 NOTE — Progress Notes (Signed)
Pharmacy Antibiotic Note  Raymond Stone is a 70 y.o. male admitted on 10/12/2020 after he was found down for an unknown amount of time. He had several pressure injuries and hypovolemic shock leading to AKI. He remains on vancomycin and rocephin for a wound infection -WBC= 11.7, tmax=afeb, SCr= 0.7 -cultures- neg  Plan: -Continue vancomycin to 1000mg  IV q24h (calculatd AUC= 447) -Will follow renal function and length of therapy   Height: 5\' 9"  (175.3 cm) Weight: 75.8 kg (167 lb) IBW/kg (Calculated) : 70.7  Temp (24hrs), Avg:98.5 F (36.9 C), Min:97.8 F (36.6 C), Max:99.2 F (37.3 C)  Recent Labs  Lab 10/21/20 0254 10/22/20 0430 10/22/20 1519 10/23/20 0221 10/23/20 1134 10/24/20 0322 10/25/20 0346  WBC 15.0* 13.3*  --  11.7*  --  10.7* 11.7*  CREATININE 1.04 0.93  --  0.85  --  0.79 0.69  VANCOTROUGH  --   --   --   --  16  --   --   VANCOPEAK  --   --  42*  --   --   --   --     Estimated Creatinine Clearance: 87.1 mL/min (by C-G formula based on SCr of 0.69 mg/dL).    Allergies  Allergen Reactions  . Lisinopril Swelling    ANGIOEDEMA    Antimicrobials this admission: Ceftriaxone 4/10 >>  Vancomycin 4/10 >>   Microbiology results: 4/10 BCx: neg 4/3 MRSA PCR: neg  Thank you for allowing pharmacy to be a part of this patient's care.  Hildred Laser, PharmD Clinical Pharmacist **Pharmacist phone directory can now be found on [email or web address] ([email or web address]).  Listed under [place].

## 2020-10-25 NOTE — Procedures (Signed)
Cortrak  Person Inserting Tube:  Maylon Peppers C, RD Tube Type:  Cortrak - 43 inches Tube Location:  Left nare Initial Placement:  Stomach Secured by: Bridle Technique Used to Measure Tube Placement:  Documented cm marking at nare/ corner of mouth Cortrak Secured At:  66 cm    Cortrak Tube Team Note:  Consult received to place a Cortrak feeding tube.   No x-ray is required. RN may begin using tube.   If the tube becomes dislodged please keep the tube and contact the Cortrak team at [email or web address] ([email or web address]) for replacement.  If after hours and replacement cannot be delayed, place a NG tube and confirm placement with an abdominal x-ray.    Lockie Pares., RD, LDN, CNSC See AMiON for contact information

## 2020-10-25 NOTE — Progress Notes (Signed)
Physical Therapy Wound Treatment Patient Details  Name: Raymond Stone MRN: [id number] Date of Birth: 1950/12/20  Today's Date: 10/25/2020 Time: 4196-2229 Time Calculation (min): 28 min  Subjective  Subjective Assessment Subjective: Eyes closed/asleep for most of session however will occasionally answer questions. Patient and Family Stated Goals: None stated Date of Onset:  (Unsure) Prior Treatments: Dressing changes prior to hydro being consulted.  Pain Score:  Pt with eyes closed throughout session. No complaints of pain reported.   Wound Assessment  Pressure Injury 10/13/20 Flank Left;Mid Unstageable - Full thickness tissue loss in which the base of the injury is covered by slough (yellow, tan, gray, green or brown) and/or eschar (tan, brown or black) in the wound bed. (Active)  Dressing Type Barrier Film (skin prep);Foam - Lift dressing to assess site every shift;Gauze (Comment);Moist to moist;Santyl 10/25/20 1410  Dressing Changed;Clean;Dry;Intact 10/25/20 1410  Dressing Change Frequency Daily 10/25/20 1410  State of Healing Early/partial granulation 10/25/20 1410  Site / Wound Assessment Red;Pink;Yellow 10/25/20 1410  % Wound base Red or Granulating 30% 10/25/20 1410  % Wound base Yellow/Fibrinous Exudate 30% 10/25/20 1410  % Wound base Black/Eschar 0% 10/25/20 1410  % Wound base Other/Granulation Tissue (Comment) 40% 10/25/20 1410  Peri-wound Assessment Intact;Black 10/25/20 1410  Wound Length (cm) 6 cm 10/23/20 1100  Wound Width (cm) 2.8 cm 10/23/20 1100  Wound Depth (cm) 0.2 cm 10/23/20 1100  Wound Surface Area (cm^2) 16.8 cm^2 10/23/20 1100  Wound Volume (cm^3) 3.36 cm^3 10/23/20 1100  Tunneling (cm) 0 10/15/20 1349  Undermining (cm) 0 10/15/20 1349  Margins Unattached edges (unapproximated) 10/25/20 1410  Drainage Amount Minimal 10/25/20 1410  Drainage Description Serosanguineous 10/25/20 1410  Treatment Debridement (Selective);Hydrotherapy (Pulse lavage);Packing  (Saline gauze) 10/25/20 1410      Hydrotherapy Pulsed lavage therapy - wound location: L flank Pulsed Lavage with Suction (psi): 8 psi Pulsed Lavage with Suction - Normal Saline Used: 1000 mL Pulsed Lavage Tip: Tip with splash shield Selective Debridement Selective Debridement - Location: L flank Selective Debridement - Tools Used: Scalpel,Forceps Selective Debridement - Tissue Removed: yellow slough    Wound Assessment and Plan  Wound Therapy - Assess/Plan/Recommendations Wound Therapy - Clinical Statement: Wound appears improved with slough progressing to healthy appearing white subcutaneous tissue. Debridement is difficult due to thin tissue around rib cage, and extra care is being taken not to disturb any underlying healthy tissue. Will benefit from continued hydrotherapy for selective removal of uviable tissue, to decrease bioburden and promote wound bed healing. Wound Therapy - Functional Problem List: Global weakness in the setting of undetermined time down at home. Factors Delaying/Impairing Wound Healing: Infection - systemic/local,Immobility Hydrotherapy Plan: Debridement,Dressing change,Patient/family education,Pulsatile lavage with suction Wound Therapy - Frequency: 6X / week Wound Therapy - Current Recommendations: PT Wound Therapy - Follow Up Recommendations: dressing changes by RN  Wound Therapy Goals- Improve the function of patient's integumentary system by progressing the wound(s) through the phases of wound healing (inflammation - proliferation - remodeling) by: Wound Therapy Goals - Improve the function of patient's integumentary system by progressing the wound(s) through the phases of wound healing by: Decrease Necrotic Tissue to: 20% Decrease Necrotic Tissue - Progress: Progressing toward goal Increase Granulation Tissue to: 80% Increase Granulation Tissue - Progress: Progressing toward goal Goals/treatment plan/discharge plan were made with and agreed upon by  patient/family: No, Patient unable to participate in goals/treatment/discharge plan and family unavailable Time For Goal Achievement: 7 days Wound Therapy - Potential for Goals: Good  Goals will be updated until  maximal potential achieved or discharge criteria met.  Discharge criteria: when goals achieved, discharge from hospital, MD decision/surgical intervention, no progress towards goals, refusal/missing three consecutive treatments without notification or medical reason.  GP     Charges PT Wound Care Charges $Wound Debridement up to 20 cm: < or equal to 20 cm $PT Hydrotherapy Dressing: 1 dressing $PT PLS Gun and Tip: 1 Supply $PT Hydrotherapy Visit: 1 Visit       Thelma Comp 10/25/2020, 2:19 PM   Rolinda Roan, PT, DPT Acute Rehabilitation Services Pager: [phone] Office: [phone]

## 2020-10-26 DIAGNOSIS — G319 Degenerative disease of nervous system, unspecified: Secondary | ICD-10-CM

## 2020-10-26 DIAGNOSIS — E440 Moderate protein-calorie malnutrition: Secondary | ICD-10-CM | POA: Diagnosis not present

## 2020-10-26 DIAGNOSIS — Z7189 Other specified counseling: Secondary | ICD-10-CM | POA: Diagnosis not present

## 2020-10-26 DIAGNOSIS — Z515 Encounter for palliative care: Secondary | ICD-10-CM | POA: Diagnosis not present

## 2020-10-26 DIAGNOSIS — G9341 Metabolic encephalopathy: Secondary | ICD-10-CM | POA: Diagnosis not present

## 2020-10-26 DIAGNOSIS — R571 Hypovolemic shock: Secondary | ICD-10-CM | POA: Diagnosis not present

## 2020-10-26 DIAGNOSIS — R627 Adult failure to thrive: Secondary | ICD-10-CM

## 2020-10-26 LAB — CBC WITH DIFFERENTIAL/PLATELET
Abs Immature Granulocytes: 0.11 10*3/uL — ABNORMAL HIGH (ref 0.00–0.07)
Basophils Absolute: 0 10*3/uL (ref 0.0–0.1)
Basophils Relative: 0 %
Eosinophils Absolute: 0.3 10*3/uL (ref 0.0–0.5)
Eosinophils Relative: 3 %
HCT: 26.8 % — ABNORMAL LOW (ref 39.0–52.0)
Hemoglobin: 9 g/dL — ABNORMAL LOW (ref 13.0–17.0)
Immature Granulocytes: 1 %
Lymphocytes Relative: 12 %
Lymphs Abs: 1.2 10*3/uL (ref 0.7–4.0)
MCH: 33 pg (ref 26.0–34.0)
MCHC: 33.6 g/dL (ref 30.0–36.0)
MCV: 98.2 fL (ref 80.0–100.0)
Monocytes Absolute: 0.8 10*3/uL (ref 0.1–1.0)
Monocytes Relative: 7 %
Neutro Abs: 8.1 10*3/uL — ABNORMAL HIGH (ref 1.7–7.7)
Neutrophils Relative %: 77 %
Platelets: 573 10*3/uL — ABNORMAL HIGH (ref 150–400)
RBC: 2.73 MIL/uL — ABNORMAL LOW (ref 4.22–5.81)
RDW: 13.4 % (ref 11.5–15.5)
WBC: 10.6 10*3/uL — ABNORMAL HIGH (ref 4.0–10.5)
nRBC: 0 % (ref 0.0–0.2)

## 2020-10-26 LAB — GLUCOSE, CAPILLARY
Glucose-Capillary: 123 mg/dL — ABNORMAL HIGH (ref 70–99)
Glucose-Capillary: 121 mg/dL — ABNORMAL HIGH (ref 70–99)
Glucose-Capillary: 129 mg/dL — ABNORMAL HIGH (ref 70–99)
Glucose-Capillary: 146 mg/dL — ABNORMAL HIGH (ref 70–99)

## 2020-10-26 LAB — MAGNESIUM
Magnesium: 1.6 mg/dL — ABNORMAL LOW (ref 1.7–2.4)
Magnesium: 1.5 mg/dL — ABNORMAL LOW (ref 1.7–2.4)

## 2020-10-26 LAB — PHOSPHORUS
Phosphorus: 3.9 mg/dL (ref 2.5–4.6)
Phosphorus: 3.3 mg/dL (ref 2.5–4.6)

## 2020-10-26 LAB — BASIC METABOLIC PANEL
Anion gap: 8 (ref 5–15)
BUN: 16 mg/dL (ref 8–23)
CO2: 23 mmol/L (ref 22–32)
Calcium: 10.1 mg/dL (ref 8.9–10.3)
Chloride: 104 mmol/L (ref 98–111)
Creatinine, Ser: 0.63 mg/dL (ref 0.61–1.24)
GFR, Estimated: >60 mL/min (ref >60)
Glucose, Bld: 125 mg/dL — ABNORMAL HIGH (ref 70–99)
Potassium: 4.1 mmol/L (ref 3.5–5.1)
Sodium: 135 mmol/L (ref 135–145)

## 2020-10-26 MED ORDER — METOPROLOL TARTRATE 25 MG/10 ML ORAL SUSPENSION
50.0000 mg | Freq: Two times a day (BID) | ORAL | Status: DC
  Administered 2020-10-26 – 2020-10-31 (×11): 50 mg
  Filled 2020-10-26 (×12): qty 20

## 2020-10-26 MED ORDER — METOPROLOL TARTRATE 25 MG/10 ML ORAL SUSPENSION
25.0000 mg | Freq: Once | ORAL | Status: AC
  Administered 2020-10-26: 25 mg
  Filled 2020-10-26: qty 10

## 2020-10-26 NOTE — Progress Notes (Signed)
Daily Progress Note   Patient Name: Raymond Stone       Date: 10/26/2020 DOB: February 10, 1951  Age: 70 y.o. MRN#: [id number] Attending Physician: Dwyane Dee, MD Primary Care Physician: [name], NP Admit Date: 10/12/2020  Reason for Consultation/Follow-up: Establishing goals of care  Subjective: Eyes open when I walk in, speaks to me about wanting to go home. Some confusion noted.   Length of Stay: 14  Current Medications: Scheduled Meds:  . collagenase   Topical Daily  . enoxaparin (LOVENOX) injection  40 mg Subcutaneous Q24H  . feeding supplement  237 mL Oral TID WC & HS  . feeding supplement (PROSource TF)  45 mL Per Tube BID  . folic acid  1 mg Per Tube Daily  . magnesium oxide  400 mg Per Tube BID  . mouth rinse  15 mL Mouth Rinse BID  . metoprolol tartrate  25 mg Per Tube Once  . metoprolol tartrate  50 mg Per Tube BID  . mirtazapine  15 mg Oral QHS  . multivitamin  15 mL Per Tube Daily  . sennosides  5 mL Per Tube QHS  . thiamine  100 mg Per Tube Daily    Continuous Infusions: . sodium chloride 1,000 mL (10/21/20 1514)  . feeding supplement (OSMOLITE 1.5 CAL) 1,000 mL (10/26/20 1441)    PRN Meds: sodium chloride, acetaminophen (TYLENOL) oral liquid 160 mg/5 mL, lip balm, LORazepam, ondansetron (ZOFRAN) IV, polyethylene glycol, polyvinyl alcohol  Physical Exam Constitutional:      General: He is not in acute distress.    Comments: lethargic  Pulmonary:     Effort: Pulmonary effort is normal. No respiratory distress.  Skin:    General: Skin is warm and dry.             Vital Signs: BP 134/86   Pulse (!) 101   Temp 98 F (36.7 C) (Oral)   Resp 20   Ht 5\' 9"  (1.753 m)   Wt 72.6 kg   SpO2 99%   BMI 23.63 kg/m  SpO2: SpO2: 99 % O2 Device: O2 Device: Room  Air O2 Flow Rate: O2 Flow Rate (L/min): 1 L/min  Intake/output summary:   Intake/Output Summary (Last 24 hours) at 10/26/2020 1822 Last data filed at 10/26/2020 0900 Gross per 24 hour  Intake 150 ml  Output --  Net 150 ml   LBM: Last BM Date: 10/24/20 Baseline Weight: Weight: 87.9 kg (from 2021 records) Most recent weight: Weight: 72.6 kg       Palliative Assessment/Data: PPS 20%    Flowsheet Rows   Flowsheet Row Most Recent Value  Intake Tab   Referral Department Hospitalist  Unit at Time of Referral Intermediate Care Unit  Palliative Care Primary Diagnosis Trauma  Date Notified 10/21/20  Palliative Care Type New Palliative care  Reason for referral Clarify Goals of Care  Date of Admission 10/12/20  Date first seen by Palliative Care 10/23/20  # of days Palliative referral response time 2 Day(s)  # of days IP prior to Palliative referral 9  Clinical Assessment   Palliative Performance Scale Score 20%  Psychosocial & Spiritual Assessment   Palliative Care Outcomes   Patient/Family meeting held?  Yes  Who was at the meeting? dtr, sister, patient  Palliative Care Outcomes Clarified goals of care, Provided psychosocial or spiritual support      Patient Active Problem List   Diagnosis Date Noted  . Cerebral atrophy ([place]) 10/26/2020  . Failure to thrive in adult 10/26/2020  . Hypernatremia 10/18/2020  . Hyperkalemia 10/18/2020  . Acute metabolic encephalopathy 59/16/3846  . ATN (acute tubular necrosis) ([place]) 10/18/2020  . Malnutrition of moderate degree 10/17/2020  . Pressure injury of skin 10/13/2020  . Angioedema 05/22/2017  . 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 Endotracheally intubated   . History of alcohol dependence ([place]) 05/17/2017  . Leukocytosis 01/27/2017  . AKI (acute kidney injury) ([place]) 01/24/2017  . 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 Alcohol dependence ([place]) 01/24/2017  . Normocytic anemia 01/24/2017  .  Thrombocytosis 01/24/2017  . Chronic diastolic CHF (congestive heart failure) ([place]) 01/24/2017  . Scrotal edema 10/20/2016  . Nephrotic syndrome 10/20/2016  . Essential hypertension 01/07/2016  . Severe eczema 04/16/2014    Palliative Care Assessment & Plan   HPI: 70 y.o. male  with past medical history of HTN and ETOH abuse admitted on 10/12/2020 after being found down at home - had not been seen for 5 days. He was diagnosed with shock and received IV fluids and antibiotics. Did require CRRT d/t AKI. Renal function now improved but patient remains confused, refusing meds and PO intake. New fevers 4/10 and antibiotics restarted - concern for wound infection. PMT consulted to discuss [place].    Assessment: F/u with patient today. No significant changes - maybe slightly more alert today. Poor PO intake continues. Feeding tube has been placed.  Discussed with Dr. Sabino Gasser - will continue trial of tube feeding over weekend. PMT will follow up Monday.   Daughter is estranged but next of kin. In earlier conversations she had agreed to be Media planner. Depending on how weekend goes will need to have further goals of care discussions with daughter regarding ongoing aggressive care likely to include PEG and SNF vs comfort approach.   Recommendations/Plan: Tube feeding trial over weekend, PMT to follow up Mon Trial of 15 mg mirtazipine qhs - monitor mental status - family reported severe depression/withdrawal at home Senna QHS - possibly having diarrhea overnight but not documented? - RN unsure  Code Status: Full code  Discharge Planning: [facility] for rehab with Palliative care service follow-up  Care plan was discussed with Dr. Sabino Gasser, RN  Thank you for allowing the Palliative Medicine Team to assist in the care of this patient.   Total Time 20 minutes Prolonged Time Billed  no    Greater than 50%  of this time was spent counseling and coordinating care related to the above  assessment and plan.  Juel Burrow, DNP, [facility] [place] Palliative Medicine Team Team Phone # [phone]  Pager [phone]

## 2020-10-26 NOTE — Progress Notes (Signed)
Progress Note    Raymond Stone   XHB:[id number]  DOB: December 24, 1950  DOA: 10/12/2020     14  PCP: [name], NP  CC: found down   Hospital Course: Mr. Schurman admitted to the hospital with working diagnosis hypovolemic shock complicated by acute kidney injury/ATN. 70 year old male past medical history of hypertension who was found down, apparently he was not seen for about 5 days. When EMS arrived he was severely hypotensive, his temperature was 100 F he received intravenous fluids and transported to the emergency room.   Sodium 172, potassium 7.3, chloride>130,bicarb 15, glucose 156, BUN 211, creatinine 11.2, AST 118, ALT 42, total bilirubin is 1.5, troponin I 489-395, lactic acid 5.3. anion gap 49. ABG, pH 7.37, PCO2 26, PO2 96, bicarb 15 White cell count 18.7, hemoglobin 11.1, hematocrit 37.6, platelets 346. SARS COVID-19 negative. Urinalysis specific gravity 1.026, negative protein, 21-50 white cells, 0-5 red cells. Toxicology negative. Head CT negative for acute changes.  Chest radiograph no infiltrates. EKG had 19 bpm, normal axis, normal intervals, sinus rhythm, no ST segment or T wave changes. Patient was diagnosed with shock, he received intravenous fluids and broad-spectrum antibiotic therapy. Patient underwent CRRT04/03with further improvement of his kidney function. Peak CK 2,415 mild rhabdomyolysis likely not the culprit of renal failure. Patient very debilitated.  Patient with persistent fever despite broad spectrum antibiotic therapy, getting CT abdomen and pelvis which showed no acute intra-abdominal pathology.  Due to ongoing poor oral intake, we discussed need for at least temporary tube feeding.  Patient was amenable.  Coretrak was placed on 10/25/2020 and tube feeds initiated.  Interval History:  No events overnight.  Underwent cortrak placement and TF started on 4/15.  Still weak and deconditioned resting in bed this morning but had no  concerns or complaints.  ROS: Constitutional: negative for chills and fevers, Respiratory: negative for cough, Cardiovascular: negative for chest pain and Gastrointestinal: negative for abdominal pain  Assessment & Plan: Chronic metabolic and uremic encephalopathy  Advanced cerebral atrophy Adult failure to thrive -metabolic derangements corrected since admission and he is technically oriented but he is not thriving well at all; does not eat much and is so weak/deconditioned he may not have the physical reserve to survive this illness - for now, attempting TF to strengthen/re-condition as able; if fails, he would be more appropriate for hospice/comfort care and end of life discussions with his daughter (seems to be his closest living relative, but he is not close with family) - s/p cortrak 4/15 and TF started on 10/25/20  Moderatecalorie protein malnutrition Poor oral intake - Continue with nutritional supplements, multivitamins, and thiamine. -Continue dysphagia diet; not taking in much nutrition and at risk for further decline/complications; ongoing [place] with family but for now plan is aggressive measures  - agree with trial of mirtazapine - Cortrak placed on 4/15 and TF initiated; if unable to increase oral intake after some strengthening from TF then will need to discuss PEG vs comfort care   Hypovolemic shock with AKI/ATN Hyperkalemia/hypokalemia Hypernatremia - 172 on admission AGMA 2/2 renal failure Hypomagnesemia - s/p volume resuscitation on admission followed by CRRT - renal function now normalized  Sepsis 2/2 wounds (not present on admission). Patient with complex wounds, left hemithorax and left thigh - s/p repeat abx course x 5 days - continue ongoing wound care - see failure to thrive  HTN. -Continue Lopressor  Alcohol abuse - continue folic, thiamine, MVI - PRN benzo  Aspiration pneumonitis (present on admission)  Continue  with aspiration  precautions.  Old records reviewed in assessment of this patient  Antimicrobials: Zosyn 4/3 - 4/4 Rocephin 4/10 >> 4/15 Vanc 4/10 >> 4/15  DVT prophylaxis: enoxaparin (LOVENOX) injection 40 mg Start: 10/17/20 1530 SCDs Start: 10/12/20 2213   Code Status:   Code Status: Full Code Family Communication:   Disposition Plan: Status is: Inpatient  Remains inpatient appropriate because:IV treatments appropriate due to intensity of illness or inability to take PO and Inpatient level of care appropriate due to severity of illness   Dispo: The patient is from: Home              Anticipated d/c is to: SNF              Patient currently is not medically stable to d/c.   Difficult to place patient No  Risk of unplanned readmission score: Unplanned Admission- Pilot do not use: 14.97   Objective: Blood pressure 137/72, pulse (!) 112, temperature 98.3 F (36.8 C), temperature source Oral, resp. rate 20, height 5\' 9"  (1.753 m), weight 72.6 kg, SpO2 100 %.  Examination: General appearance: Chronically ill-appearing and globally weak/deconditioned man laying in bed Head: Normocephalic, without obvious abnormality, atraumatic Eyes: EOMI  Nose: cortrak in place Lungs: clear to auscultation bilaterally Heart: regular rate and rhythm and S1, S2 normal Abdomen: normal findings: bowel sounds normal and soft, non-tender Extremities: edematous Skin: scattered excoriations and dry skin Neurologic: worsening LE weakness compared to UE but follows commands and moves all 4 extremities (minimal in LE)  Consultants:   PCM  Procedures:     Data Reviewed: I have personally reviewed following labs and imaging studies Results for orders placed or performed during the hospital encounter of 10/12/20 (from the past 24 hour(s))  LAB REPORT - SCANNED     Status: None   Collection Time: 10/25/20  9:58 AM   Narrative   Ordered by an unspecified provider.  Glucose, capillary     Status: None    Collection Time: 10/25/20  4:08 PM  Result Value Ref Range   Glucose-Capillary 83 70 - 99 mg/dL  Prealbumin     Status: Abnormal   Collection Time: 10/25/20  5:39 PM  Result Value Ref Range   Prealbumin 5.5 (L) 18 - 38 mg/dL  Glucose, capillary     Status: None   Collection Time: 10/25/20  8:11 PM  Result Value Ref Range   Glucose-Capillary 95 70 - 99 mg/dL  Glucose, capillary     Status: Abnormal   Collection Time: 10/25/20 11:49 PM  Result Value Ref Range   Glucose-Capillary 104 (H) 70 - 99 mg/dL  CBC with Differential/Platelet     Status: Abnormal   Collection Time: 10/26/20  4:30 AM  Result Value Ref Range   WBC 10.6 (H) 4.0 - 10.5 K/uL   RBC 2.73 (L) 4.22 - 5.81 MIL/uL   Hemoglobin 9.0 (L) 13.0 - 17.0 g/dL   HCT 26.8 (L) 39.0 - 52.0 %   MCV 98.2 80.0 - 100.0 fL   MCH 33.0 26.0 - 34.0 pg   MCHC 33.6 30.0 - 36.0 g/dL   RDW 13.4 11.5 - 15.5 %   Platelets 573 (H) 150 - 400 K/uL   nRBC 0.0 0.0 - 0.2 %   Neutrophils Relative % 77 %   Neutro Abs 8.1 (H) 1.7 - 7.7 K/uL   Lymphocytes Relative 12 %   Lymphs Abs 1.2 0.7 - 4.0 K/uL   Monocytes Relative 7 %   Monocytes  Absolute 0.8 0.1 - 1.0 K/uL   Eosinophils Relative 3 %   Eosinophils Absolute 0.3 0.0 - 0.5 K/uL   Basophils Relative 0 %   Basophils Absolute 0.0 0.0 - 0.1 K/uL   Immature Granulocytes 1 %   Abs Immature Granulocytes 0.11 (H) 0.00 - 0.07 K/uL  Basic metabolic panel     Status: Abnormal   Collection Time: 10/26/20  4:30 AM  Result Value Ref Range   Sodium 135 135 - 145 mmol/L   Potassium 4.1 3.5 - 5.1 mmol/L   Chloride 104 98 - 111 mmol/L   CO2 23 22 - 32 mmol/L   Glucose, Bld 125 (H) 70 - 99 mg/dL   BUN 16 8 - 23 mg/dL   Creatinine, Ser 0.63 0.61 - 1.24 mg/dL   Calcium 10.1 8.9 - 10.3 mg/dL   GFR, Estimated >60 >60 mL/min   Anion gap 8 5 - 15  Magnesium     Status: Abnormal   Collection Time: 10/26/20  4:30 AM  Result Value Ref Range   Magnesium 1.6 (L) 1.7 - 2.4 mg/dL  Phosphorus     Status: None    Collection Time: 10/26/20  4:30 AM  Result Value Ref Range   Phosphorus 3.9 2.5 - 4.6 mg/dL  Glucose, capillary     Status: Abnormal   Collection Time: 10/26/20  7:39 AM  Result Value Ref Range   Glucose-Capillary 123 (H) 70 - 99 mg/dL    Recent Results (from the past 240 hour(s))  Culture, blood (routine x 2)     Status: None   Collection Time: 10/20/20  1:34 AM   Specimen: BLOOD  Result Value Ref Range Status   Specimen Description BLOOD LEFT HAND  Final   Special Requests   Final    BOTTLES DRAWN AEROBIC AND ANAEROBIC Blood Culture adequate volume   Culture   Final    NO GROWTH 5 DAYS Performed at [facility], [address], [place], [place] [address]    Report Status 10/25/2020 FINAL  Final  Culture, blood (routine x 2)     Status: None   Collection Time: 10/20/20  1:43 AM   Specimen: BLOOD  Result Value Ref Range Status   Specimen Description BLOOD RIGHT HAND  Final   Special Requests   Final    BOTTLES DRAWN AEROBIC AND ANAEROBIC Blood Culture adequate volume   Culture   Final    NO GROWTH 5 DAYS Performed at [facility], [place] [address], [place],  [place] [address]    Report Status 10/25/2020 FINAL  Final     Radiology Studies: No results found. CT ABDOMEN PELVIS W CONTRAST  Final Result    DG CHEST PORT 1 VIEW  Final Result    DG CHEST PORT 1 VIEW  Final Result    US RENAL  Final Result    CT Head Wo Contrast  Final Result    DG Chest Port 1 View  Final Result      Scheduled Meds: . collagenase   Topical Daily  . enoxaparin (LOVENOX) injection  40 mg Subcutaneous Q24H  . feeding supplement  237 mL Oral TID WC & HS  . feeding supplement (PROSource TF)  45 mL Per Tube BID  . folic acid  1 mg Per Tube Daily  . magnesium oxide  400 mg Per Tube BID  . mouth rinse  15 mL Mouth Rinse BID  . metoprolol tartrate  25 mg Per Tube BID  .  mirtazapine  15 mg Oral QHS  . multivitamin  15 mL Per Tube Daily  . sennosides  5 mL Per Tube QHS  .  thiamine  100 mg Per Tube Daily   PRN Meds: sodium chloride, acetaminophen (TYLENOL) oral liquid 160 mg/5 mL, lip balm, LORazepam, ondansetron (ZOFRAN) IV, polyethylene glycol, polyvinyl alcohol Continuous Infusions: . sodium chloride 1,000 mL (10/21/20 1514)  . feeding supplement (OSMOLITE 1.5 CAL) 1,000 mL (10/25/20 1900)     LOS: 14 days  Time spent: Greater than 50% of the 35 minute visit was spent in counseling/coordination of care for the patient as laid out in the A&P.   Dwyane Dee, MD [facility] 10/26/2020, 8:04 AM

## 2020-10-27 ENCOUNTER — Inpatient Hospital Stay (HOSPITAL_COMMUNITY): Payer: Medicare Other

## 2020-10-27 DIAGNOSIS — R509 Fever, unspecified: Secondary | ICD-10-CM

## 2020-10-27 DIAGNOSIS — R571 Hypovolemic shock: Secondary | ICD-10-CM | POA: Diagnosis not present

## 2020-10-27 DIAGNOSIS — G319 Degenerative disease of nervous system, unspecified: Secondary | ICD-10-CM

## 2020-10-27 DIAGNOSIS — G9341 Metabolic encephalopathy: Secondary | ICD-10-CM | POA: Diagnosis not present

## 2020-10-27 DIAGNOSIS — R627 Adult failure to thrive: Secondary | ICD-10-CM

## 2020-10-27 LAB — PHOSPHORUS: Phosphorus: 2.9 mg/dL (ref 2.5–4.6)

## 2020-10-27 LAB — CBC WITH DIFFERENTIAL/PLATELET
Abs Immature Granulocytes: 0.12 10*3/uL — ABNORMAL HIGH (ref 0.00–0.07)
Basophils Absolute: 0 10*3/uL (ref 0.0–0.1)
Basophils Relative: 0 %
Eosinophils Absolute: 0.4 10*3/uL (ref 0.0–0.5)
Eosinophils Relative: 4 %
HCT: 26 % — ABNORMAL LOW (ref 39.0–52.0)
Hemoglobin: 8.5 g/dL — ABNORMAL LOW (ref 13.0–17.0)
Immature Granulocytes: 1 %
Lymphocytes Relative: 11 %
Lymphs Abs: 1.4 10*3/uL (ref 0.7–4.0)
MCH: 32.4 pg (ref 26.0–34.0)
MCHC: 32.7 g/dL (ref 30.0–36.0)
MCV: 99.2 fL (ref 80.0–100.0)
Monocytes Absolute: 1.1 10*3/uL — ABNORMAL HIGH (ref 0.1–1.0)
Monocytes Relative: 9 %
Neutro Abs: 9.2 10*3/uL — ABNORMAL HIGH (ref 1.7–7.7)
Neutrophils Relative %: 75 %
Platelets: 523 10*3/uL — ABNORMAL HIGH (ref 150–400)
RBC: 2.62 MIL/uL — ABNORMAL LOW (ref 4.22–5.81)
RDW: 13.5 % (ref 11.5–15.5)
WBC: 12.3 10*3/uL — ABNORMAL HIGH (ref 4.0–10.5)
nRBC: 0 % (ref 0.0–0.2)

## 2020-10-27 LAB — BASIC METABOLIC PANEL
Anion gap: 7 (ref 5–15)
BUN: 17 mg/dL (ref 8–23)
CO2: 23 mmol/L (ref 22–32)
Calcium: 9.8 mg/dL (ref 8.9–10.3)
Chloride: 104 mmol/L (ref 98–111)
Creatinine, Ser: 0.6 mg/dL — ABNORMAL LOW (ref 0.61–1.24)
GFR, Estimated: >60 mL/min (ref >60)
Glucose, Bld: 134 mg/dL — ABNORMAL HIGH (ref 70–99)
Potassium: 4.2 mmol/L (ref 3.5–5.1)
Sodium: 134 mmol/L — ABNORMAL LOW (ref 135–145)

## 2020-10-27 LAB — GLUCOSE, CAPILLARY
Glucose-Capillary: 126 mg/dL — ABNORMAL HIGH (ref 70–99)
Glucose-Capillary: 130 mg/dL — ABNORMAL HIGH (ref 70–99)
Glucose-Capillary: 133 mg/dL — ABNORMAL HIGH (ref 70–99)
Glucose-Capillary: 139 mg/dL — ABNORMAL HIGH (ref 70–99)
Glucose-Capillary: 142 mg/dL — ABNORMAL HIGH (ref 70–99)

## 2020-10-27 LAB — MAGNESIUM: Magnesium: 1.4 mg/dL — ABNORMAL LOW (ref 1.7–2.4)

## 2020-10-27 NOTE — Progress Notes (Signed)
Progress Note    Raymond Stone   KXF:[id number]  DOB: [birth year]  DOA: 10/12/2020     15  PCP: [name], NP  CC: found down   Hospital Course: Raymond Stone admitted to the hospital with working diagnosis hypovolemic shock complicated by acute kidney injury/ATN. 70 year old male past medical history of hypertension who was found down, apparently he was not seen for about 5 days. When EMS arrived he was severely hypotensive, his temperature was 100 F he received intravenous fluids and transported to the emergency room.   Sodium 172, potassium 7.3, chloride>130,bicarb 15, glucose 156, BUN 211, creatinine 11.2, AST 118, ALT 42, total bilirubin is 1.5, troponin I 489-395, lactic acid 5.3. anion gap 49. ABG, pH 7.37, PCO2 26, PO2 96, bicarb 15 White cell count 18.7, hemoglobin 11.1, hematocrit 37.6, platelets 346. SARS COVID-19 negative. Urinalysis specific gravity 1.026, negative protein, 21-50 white cells, 0-5 red cells. Toxicology negative. Head CT negative for acute changes.  Chest radiograph no infiltrates. EKG had 19 bpm, normal axis, normal intervals, sinus rhythm, no ST segment or T wave changes. Patient was diagnosed with shock, he received intravenous fluids and broad-spectrum antibiotic therapy. Patient underwent CRRT04/03with further improvement of his kidney function. Peak CK 2,415 mild rhabdomyolysis likely not the culprit of renal failure. Patient very debilitated.  Patient with persistent fever despite broad spectrum antibiotic therapy, getting CT abdomen and pelvis which showed no acute intra-abdominal pathology.  Due to ongoing poor oral intake, we discussed need for at least temporary tube feeding.  Patient was amenable.  Coretrak was placed on 10/25/2020 and tube feeds initiated.  Interval History:  Yesterday late afternoon became more tachycardic.  EKG showed sinus tach.  His Lopressor was increased.  This morning he had a fever.  CXR and  abdominal x-ray were obtained which were unremarkable for etiology.  He does have multiple wounds throughout his body but has been on antibiotics and wound beds have been reported to be healing well. He was otherwise resting comfortably in bed when seen and had no questions or concerns.  Denied any pains or difficulty with breathing.  ROS: Constitutional: negative for chills and fevers, Respiratory: negative for cough, Cardiovascular: negative for chest pain and Gastrointestinal: negative for abdominal pain  Assessment & Plan: Chronic metabolic encephalopathy  Uremic encephalopathy - resolved  Advanced cerebral atrophy Adult failure to thrive -metabolic derangements corrected since admission and he is technically oriented but he is not thriving well at all; does not eat much and is so weak/deconditioned he may not have the physical reserve to survive this illness - for now, attempting TF to strengthen/re-condition as able; if fails, he would be more appropriate for hospice/comfort care and end of life discussions with his daughter (seems to be his closest living relative, but he is not close with family) - s/p cortrak 4/15 and TF started on 10/25/20  Moderatecalorie protein malnutrition Poor oral intake - Continue with nutritional supplements, multivitamins, and thiamine. -Continue dysphagia diet; not taking in much nutrition and at risk for further decline/complications; ongoing [place] with family but for now plan is aggressive measures  - agree with trial of mirtazapine - Cortrak placed on 4/15 and TF initiated; if unable to increase oral intake after some strengthening from TF then will need to discuss PEG vs comfort care   Fever - temp ~101. No atelectasis or infiltrates on CXR. Abd benign as well. Multiple wounds on body but has been on several days of abx - differential includes drug  fever vs central fever vs early atelectasis (bed bound) vs wounds vs other - will obtain blood cultures  at this time; has an external catheter in place and no PICC or central access - hold off on abx - will inspect wound beds this afternoon during dressing change   Hypovolemic shock with AKI/ATN Hyperkalemia/hypokalemia Hypernatremia - 172 on admission AGMA 2/2 renal failure Hypomagnesemia - s/p volume resuscitation on admission followed by CRRT - renal function now normalized  Sepsis 2/2 wounds (not present on admission). Patient with complex wounds, left hemithorax and left thigh - s/p repeat abx course x 5 days - continue ongoing wound care - see failure to thrive  HTN. -Continue Lopressor  Alcohol abuse - continue folic, thiamine, MVI - PRN benzo  Aspiration pneumonitis (present on admission)  Continue with aspiration precautions.  Old records reviewed in assessment of this patient  Antimicrobials: Zosyn 4/3 - 4/4 Rocephin 4/10 >> 4/15 Vanc 4/10 >> 4/15  DVT prophylaxis: enoxaparin (LOVENOX) injection 40 mg Start: 10/17/20 1530 SCDs Start: 10/12/20 2213   Code Status:   Code Status: Full Code Family Communication:   Disposition Plan: Status is: Inpatient  Remains inpatient appropriate because:IV treatments appropriate due to intensity of illness or inability to take PO and Inpatient level of care appropriate due to severity of illness   Dispo: The patient is from: Home              Anticipated d/c is to: SNF              Patient currently is not medically stable to d/c.   Difficult to place patient No  Risk of unplanned readmission score: Unplanned Admission- Pilot do not use: 15.23   Objective: Blood pressure 116/64, pulse (!) 112, temperature (!) 101 F (38.3 C), temperature source Axillary, resp. rate 16, height 5\' 9"  (1.753 m), weight 72.1 kg, SpO2 99 %.  Examination: General appearance: Chronically ill-appearing and globally weak/deconditioned man laying in bed Head: Normocephalic, without obvious abnormality, atraumatic Eyes: EOMI  Nose:  cortrak in place Lungs: coarse sounds bilaterally, no rhonchi or wheezing Heart: regular rate and rhythm and S1, S2 normal Abdomen: normal findings: bowel sounds normal and soft, non-tender Extremities: edematous Skin: scattered excoriations and dry skin Neurologic: worsening LE weakness compared to UE but follows commands and moves all 4 extremities (minimal in LE)  Consultants:   PCM  Procedures:     Data Reviewed: I have personally reviewed following labs and imaging studies Results for orders placed or performed during the hospital encounter of 10/12/20 (from the past 24 hour(s))  Glucose, capillary     Status: Abnormal   Collection Time: 10/26/20  5:58 PM  Result Value Ref Range   Glucose-Capillary 129 (H) 70 - 99 mg/dL  Phosphorus     Status: None   Collection Time: 10/26/20  6:14 PM  Result Value Ref Range   Phosphorus 3.3 2.5 - 4.6 mg/dL  Magnesium     Status: Abnormal   Collection Time: 10/26/20  6:14 PM  Result Value Ref Range   Magnesium 1.5 (L) 1.7 - 2.4 mg/dL  Glucose, capillary     Status: Abnormal   Collection Time: 10/26/20  8:16 PM  Result Value Ref Range   Glucose-Capillary 146 (H) 70 - 99 mg/dL  Glucose, capillary     Status: Abnormal   Collection Time: 10/27/20 12:11 AM  Result Value Ref Range   Glucose-Capillary 130 (H) 70 - 99 mg/dL  CBC with Differential/Platelet  Status: Abnormal   Collection Time: 10/27/20  2:39 AM  Result Value Ref Range   WBC 12.3 (H) 4.0 - 10.5 K/uL   RBC 2.62 (L) 4.22 - 5.81 MIL/uL   Hemoglobin 8.5 (L) 13.0 - 17.0 g/dL   HCT 26.0 (L) 39.0 - 52.0 %   MCV 99.2 80.0 - 100.0 fL   MCH 32.4 26.0 - 34.0 pg   MCHC 32.7 30.0 - 36.0 g/dL   RDW 13.5 11.5 - 15.5 %   Platelets 523 (H) 150 - 400 K/uL   nRBC 0.0 0.0 - 0.2 %   Neutrophils Relative % 75 %   Neutro Abs 9.2 (H) 1.7 - 7.7 K/uL   Lymphocytes Relative 11 %   Lymphs Abs 1.4 0.7 - 4.0 K/uL   Monocytes Relative 9 %   Monocytes Absolute 1.1 (H) 0.1 - 1.0 K/uL    Eosinophils Relative 4 %   Eosinophils Absolute 0.4 0.0 - 0.5 K/uL   Basophils Relative 0 %   Basophils Absolute 0.0 0.0 - 0.1 K/uL   Immature Granulocytes 1 %   Abs Immature Granulocytes 0.12 (H) 0.00 - 0.07 K/uL  Basic metabolic panel     Status: Abnormal   Collection Time: 10/27/20  2:39 AM  Result Value Ref Range   Sodium 134 (L) 135 - 145 mmol/L   Potassium 4.2 3.5 - 5.1 mmol/L   Chloride 104 98 - 111 mmol/L   CO2 23 22 - 32 mmol/L   Glucose, Bld 134 (H) 70 - 99 mg/dL   BUN 17 8 - 23 mg/dL   Creatinine, Ser 0.60 (L) 0.61 - 1.24 mg/dL   Calcium 9.8 8.9 - 10.3 mg/dL   GFR, Estimated >60 >60 mL/min   Anion gap 7 5 - 15  Magnesium     Status: Abnormal   Collection Time: 10/27/20  2:39 AM  Result Value Ref Range   Magnesium 1.4 (L) 1.7 - 2.4 mg/dL  Phosphorus     Status: None   Collection Time: 10/27/20  2:39 AM  Result Value Ref Range   Phosphorus 2.9 2.5 - 4.6 mg/dL  Glucose, capillary     Status: Abnormal   Collection Time: 10/27/20  4:24 AM  Result Value Ref Range   Glucose-Capillary 139 (H) 70 - 99 mg/dL  Glucose, capillary     Status: Abnormal   Collection Time: 10/27/20  7:41 AM  Result Value Ref Range   Glucose-Capillary 142 (H) 70 - 99 mg/dL    Recent Results (from the past 240 hour(s))  Culture, blood (routine x 2)     Status: None   Collection Time: 10/20/20  1:34 AM   Specimen: BLOOD  Result Value Ref Range Status   Specimen Description BLOOD LEFT HAND  Final   Special Requests   Final    BOTTLES DRAWN AEROBIC AND ANAEROBIC Blood Culture adequate volume   Culture   Final    NO GROWTH 5 DAYS Performed at [facility], [address], [place], [place] [address]    Report Status 10/25/2020 FINAL  Final  Culture, blood (routine x 2)     Status: None   Collection Time: 10/20/20  1:43 AM   Specimen: BLOOD  Result Value Ref Range Status   Specimen Description BLOOD RIGHT HAND  Final   Special Requests   Final    BOTTLES DRAWN AEROBIC AND ANAEROBIC  Blood Culture adequate volume   Culture   Final    NO GROWTH 5 DAYS Performed  at [facility], [place] [address] [place], [place] [address]    Report Status 10/25/2020 FINAL  Final     Radiology Studies: DG CHEST PORT 1 VIEW  Result Date: 10/27/2020 CLINICAL DATA:  Shortness of breath, fever, abdominal discomfort. EXAM: PORTABLE CHEST 1 VIEW COMPARISON:  Chest x-rays dated 10/20/2020 and 10/13/2020. FINDINGS: Enteric tube passes below the diaphragm. Heart size and mediastinal contours are stable. Lungs are clear. No pleural effusion or pneumothorax is seen. Osseous structures about the chest are unremarkable. IMPRESSION: No active disease. No evidence of pneumonia, atelectasis or pulmonary edema. Electronically Signed   By: Franki Cabot M.D.   On: 10/27/2020 09:24   DG Abd Portable 1V  Result Date: 10/27/2020 CLINICAL DATA:  Abdominal discomfort, fever, shortness of breath. EXAM: PORTABLE ABDOMEN - 1 VIEW COMPARISON:  CT abdomen dated 10/23/2020. FINDINGS: Enteric tube appears adequately positioned in the stomach with tip directed towards the pylorus/duodenal bulb. Bowel gas pattern is nonobstructive. No dilated large or small bowel loops are seen. No evidence of free intraperitoneal air. No evidence of renal or ureteral calculi. IMPRESSION: 1. Enteric tube appears adequately positioned in the stomach with tip directed towards the pylorus/duodenal bulb. 2. Nonobstructive bowel gas pattern. Electronically Signed   By: Franki Cabot M.D.   On: 10/27/2020 09:22   DG Abd Portable 1V  Final Result    DG CHEST PORT 1 VIEW  Final Result    CT ABDOMEN PELVIS W CONTRAST  Final Result    DG CHEST PORT 1 VIEW  Final Result    DG CHEST PORT 1 VIEW  Final Result    US RENAL  Final Result    CT Head Wo Contrast  Final Result    DG Chest Port 1 View  Final Result      Scheduled Meds: . collagenase   Topical Daily  . enoxaparin (LOVENOX) injection  40 mg Subcutaneous Q24H  .  feeding supplement  237 mL Oral TID WC & HS  . feeding supplement (PROSource TF)  45 mL Per Tube BID  . folic acid  1 mg Per Tube Daily  . magnesium oxide  400 mg Per Tube BID  . mouth rinse  15 mL Mouth Rinse BID  . metoprolol tartrate  50 mg Per Tube BID  . mirtazapine  15 mg Oral QHS  . multivitamin  15 mL Per Tube Daily  . sennosides  5 mL Per Tube QHS  . thiamine  100 mg Per Tube Daily   PRN Meds: sodium chloride, acetaminophen (TYLENOL) oral liquid 160 mg/5 mL, lip balm, LORazepam, ondansetron (ZOFRAN) IV, polyethylene glycol, polyvinyl alcohol Continuous Infusions: . sodium chloride 1,000 mL (10/21/20 1514)  . feeding supplement (OSMOLITE 1.5 CAL) 1,000 mL (10/26/20 1441)     LOS: 15 days  Time spent: Greater than 50% of the 35 minute visit was spent in counseling/coordination of care for the patient as laid out in the A&P.   Dwyane Dee, MD [facility] 10/27/2020, 1:05 PM

## 2020-10-28 DIAGNOSIS — G319 Degenerative disease of nervous system, unspecified: Secondary | ICD-10-CM | POA: Diagnosis not present

## 2020-10-28 DIAGNOSIS — Z515 Encounter for palliative care: Secondary | ICD-10-CM | POA: Diagnosis not present

## 2020-10-28 DIAGNOSIS — Z7189 Other specified counseling: Secondary | ICD-10-CM | POA: Diagnosis not present

## 2020-10-28 DIAGNOSIS — R571 Hypovolemic shock: Secondary | ICD-10-CM | POA: Diagnosis not present

## 2020-10-28 DIAGNOSIS — R627 Adult failure to thrive: Secondary | ICD-10-CM | POA: Diagnosis not present

## 2020-10-28 DIAGNOSIS — G9341 Metabolic encephalopathy: Secondary | ICD-10-CM | POA: Diagnosis not present

## 2020-10-28 LAB — GLUCOSE, CAPILLARY
Glucose-Capillary: 126 mg/dL — ABNORMAL HIGH (ref 70–99)
Glucose-Capillary: 129 mg/dL — ABNORMAL HIGH (ref 70–99)
Glucose-Capillary: 134 mg/dL — ABNORMAL HIGH (ref 70–99)
Glucose-Capillary: 139 mg/dL — ABNORMAL HIGH (ref 70–99)
Glucose-Capillary: 142 mg/dL — ABNORMAL HIGH (ref 70–99)
Glucose-Capillary: 146 mg/dL — ABNORMAL HIGH (ref 70–99)

## 2020-10-28 LAB — CBC WITH DIFFERENTIAL/PLATELET
Abs Immature Granulocytes: 0.14 10*3/uL — ABNORMAL HIGH (ref 0.00–0.07)
Basophils Absolute: 0 10*3/uL (ref 0.0–0.1)
Basophils Relative: 0 %
Eosinophils Absolute: 0.4 10*3/uL (ref 0.0–0.5)
Eosinophils Relative: 4 %
HCT: 27.3 % — ABNORMAL LOW (ref 39.0–52.0)
Hemoglobin: 8.9 g/dL — ABNORMAL LOW (ref 13.0–17.0)
Immature Granulocytes: 1 %
Lymphocytes Relative: 12 %
Lymphs Abs: 1.3 10*3/uL (ref 0.7–4.0)
MCH: 32.6 pg (ref 26.0–34.0)
MCHC: 32.6 g/dL (ref 30.0–36.0)
MCV: 100 fL (ref 80.0–100.0)
Monocytes Absolute: 1 10*3/uL (ref 0.1–1.0)
Monocytes Relative: 9 %
Neutro Abs: 7.9 10*3/uL — ABNORMAL HIGH (ref 1.7–7.7)
Neutrophils Relative %: 74 %
Platelets: 418 10*3/uL — ABNORMAL HIGH (ref 150–400)
RBC: 2.73 MIL/uL — ABNORMAL LOW (ref 4.22–5.81)
RDW: 13.8 % (ref 11.5–15.5)
WBC: 10.8 10*3/uL — ABNORMAL HIGH (ref 4.0–10.5)
nRBC: 0 % (ref 0.0–0.2)

## 2020-10-28 LAB — BASIC METABOLIC PANEL
Anion gap: 6 (ref 5–15)
BUN: 19 mg/dL (ref 8–23)
CO2: 25 mmol/L (ref 22–32)
Calcium: 10.1 mg/dL (ref 8.9–10.3)
Chloride: 100 mmol/L (ref 98–111)
Creatinine, Ser: 0.66 mg/dL (ref 0.61–1.24)
GFR, Estimated: >60 mL/min (ref >60)
Glucose, Bld: 131 mg/dL — ABNORMAL HIGH (ref 70–99)
Potassium: 4.5 mmol/L (ref 3.5–5.1)
Sodium: 131 mmol/L — ABNORMAL LOW (ref 135–145)

## 2020-10-28 LAB — MAGNESIUM: Magnesium: 1.5 mg/dL — ABNORMAL LOW (ref 1.7–2.4)

## 2020-10-28 MED ORDER — COLLAGENASE 250 UNIT/GM EX OINT
TOPICAL_OINTMENT | Freq: Every day | CUTANEOUS | Status: DC
  Filled 2020-10-28 (×3): qty 30

## 2020-10-28 NOTE — Progress Notes (Addendum)
Palliative Medicine Inpatient Follow Up Note  HPI:  70 y.o.malewith past medical history of HTN and ETOH abuseadmitted on 4/2/2022after being found down at home - had not been seen for 5 days.He was diagnosed with shock and received IV fluids and antibiotics. Did require CRRT d/t AKI. Renal function now improved but patient remains confused, refusing meds and PO intake. New fevers 4/10 and antibiotics restarted - concern for wound infection. PMT consulted to discuss [facility].  Today's Discussion (10/28/2020):  *Please note that this is a verbal dictation therefore any spelling or grammatical errors are due to the "[place] [employer]" system interpretation.  Chart reviewed.  Aadith remains on core track tube feeding.  Does have a diet ordered though has been eating less than 10% of meals.  I met with Arrick this morning at bedside.  He was alert and oriented to person and place.  He shared with me that he remembered meeting me in the past I asked him what he meant by that as this was the first time we were meeting.  I reoriented him that he did meet my colleague throughout last week though he had I had never met.  He vocalized understanding.  Discussed patient case with Dr. Sabino Gasser - we determined to give Sherill another 2 days of supplemental nutrition with tube feeding and then to perform a calorie count over the following days with the help of nutrition.  This information will hopefully better aid in supporting further conversations about whether or not to continue aggressive care or for patient's daughter to pursue hospice care for [facility].  Was able to call patient's daughter, [name] and update her.  She agrees that she likes the idea of giving him a few more days to see if he can improve and is aware that further decisions will need to be made based upon the information revealed in the oncoming days.  Discussed the importance of continued conversation with family and their  medical providers  regarding overall plan of care and treatment options, ensuring decisions are within the context of the patients values and GOCs.  Questions and concerns addressed   Objective Assessment: Vital Signs Vitals:   10/28/20 0433 10/28/20 1018  BP: 131/73 120/76  Pulse: (!) 113 (!) 112  Resp: 17 16  Temp: 97.6 F (36.4 C) 99.3 F (37.4 C)  SpO2: 99% 99%    Intake/Output Summary (Last 24 hours) at 10/28/2020 1055 Last data filed at 10/28/2020 0437 Gross per 24 hour  Intake 0 ml  Output 1130 ml  Net -1130 ml   Last Weight  Most recent update: 10/28/2020  4:37 AM   Weight  73.9 kg (163 lb)           Physical Exam Constitutional:      General: He is not in acute distress. Pulmonary:     Effort: Pulmonary effort is normal.  Skin:    General: Skin is warm and dry.  Neurological:     Mental Status: He is oriented to person and place  SUMMARY OF RECOMMENDATIONS   Full Code / Full Scope of Care  Plan for two more days of NG TF  Plan for calorie count thereafter  Further conversations to take place at the end of the week regarding whether to continue with aggressive measures of transition our focus to more of a comfort oriented path  Ongoing incremental PMT support  Symptoms: FTT: Continue Mirtazepine  Time Spent: 35 Greater than 50% of the time was spent  in counseling and coordination of care ______________________________________________________________________________________ [place] Team Team Cell Phone: [phone] Please utilize secure chat with additional questions, if there is no response within 30 minutes please call the above phone number  Palliative Medicine Team providers are available by phone from 7am to 7pm daily and can be reached through the team cell phone.  Should this patient require assistance outside of these hours, please call the patient's attending physician.

## 2020-10-28 NOTE — Progress Notes (Signed)
Physical Therapy Wound Treatment Patient Details  Name: Raymond Stone MRN: [id number] Date of Birth: [birth year]  Today's Date: 10/28/2020 Time: 0923-1007 Time Calculation (min): 44 min  Subjective  Subjective Assessment Subjective: Eyes closed/asleep for session Patient and Family Stated Goals: None stated Prior Treatments: Dressing changes prior to hydro being consulted.  Pain Score:  2/10  Wound Assessment  Pressure Injury 10/13/20 Thigh Anterior;Left;Proximal Unstageable - Full thickness tissue loss in which the base of the injury is covered by slough (yellow, tan, gray, green or brown) and/or eschar (tan, brown or black) in the wound bed. (Active)  Wound Image   10/28/20 1253  Dressing Type ABD;Barrier Film (skin prep);Gauze (Comment);Moist to dry;Santyl 10/28/20 1253  Dressing Changed;Clean;Dry;Intact 10/28/20 1253  Dressing Change Frequency Daily 10/28/20 1253  State of Healing Eschar 10/28/20 1253  Site / Wound Assessment Pink;Black 10/28/20 1253  % Wound base Red or Granulating 5% 10/28/20 1253  % Wound base Yellow/Fibrinous Exudate 5% 10/28/20 1253  % Wound base Black/Eschar 90% 10/28/20 1253  % Wound base Other/Granulation Tissue (Comment) 0% 10/28/20 1253  Peri-wound Assessment Intact 10/28/20 1253  Wound Length (cm) 12.5 cm 10/28/20 1253  Wound Width (cm) 7 cm 10/28/20 1253  Wound Depth (cm) 0.1 cm 10/28/20 1253  Wound Surface Area (cm^2) 87.5 cm^2 10/28/20 1253  Wound Volume (cm^3) 8.75 cm^3 10/28/20 1253  Tunneling (cm) 0 10/15/20 1349  Undermining (cm) 0 10/15/20 1349  Margins Unattached edges (unapproximated) 10/26/20 1630  Drainage Amount Minimal 10/28/20 1253  Drainage Description Serous 10/28/20 1253  Treatment Debridement (Selective);Hydrotherapy (Pulse lavage);Packing (Saline gauze) 10/28/20 1253     Pressure Injury 10/13/20 Flank Left;Upper Unstageable - Full thickness tissue loss in which the base of the injury is covered by slough (yellow, tan, gray,  green or brown) and/or eschar (tan, brown or black) in the wound bed. (Active)  Wound Image   10/28/20 1253  Dressing Type Barrier Film (skin prep);Foam - Lift dressing to assess site every shift;Gauze (Comment);Moist to dry;Santyl 10/28/20 1253  Dressing Changed;Clean;Dry;Intact 10/28/20 1253  Dressing Change Frequency Daily 10/28/20 1253  State of Healing Eschar 10/28/20 1253  Site / Wound Assessment Pink;Yellow;Black 10/28/20 1253  % Wound base Red or Granulating 5% 10/28/20 1253  % Wound base Yellow/Fibrinous Exudate 10% 10/28/20 1253  % Wound base Black/Eschar 85% 10/28/20 1253  % Wound base Other/Granulation Tissue (Comment) 0% 10/28/20 1253  Peri-wound Assessment Intact 10/28/20 1253  Wound Length (cm) 5 cm 10/28/20 1253  Wound Width (cm) 8 cm 10/28/20 1253  Wound Depth (cm) 0.1 cm 10/28/20 1253  Wound Surface Area (cm^2) 40 cm^2 10/28/20 1253  Wound Volume (cm^3) 4 cm^3 10/28/20 1253  Margins Unattached edges (unapproximated) 10/28/20 1253  Drainage Amount Scant 10/28/20 1253  Drainage Description Serous 10/28/20 1253  Treatment Debridement (Selective);Hydrotherapy (Pulse lavage);Packing (Saline gauze) 10/28/20 1253     Pressure Injury 10/13/20 Flank Left;Mid Unstageable - Full thickness tissue loss in which the base of the injury is covered by slough (yellow, tan, gray, green or brown) and/or eschar (tan, brown or black) in the wound bed. (Active)  Wound Image   10/28/20 1253  Dressing Type Barrier Film (skin prep);Foam - Lift dressing to assess site every shift;Gauze (Comment);Moist to dry 10/28/20 1253  Dressing Changed;Clean;Dry;Intact 10/28/20 1253  Dressing Change Frequency Daily 10/28/20 1253  State of Healing Early/partial granulation 10/28/20 1253  Site / Wound Assessment Red;Yellow 10/28/20 1253  % Wound base Red or Granulating 80% 10/28/20 1253  % Wound base Yellow/Fibrinous Exudate 20% 10/28/20 1253  %  Wound base Black/Eschar 0% 10/28/20 1253  % Wound base  Other/Granulation Tissue (Comment) 0% 10/28/20 1253  Peri-wound Assessment Intact 10/28/20 1253  Wound Length (cm) 6 cm 10/28/20 1253  Wound Width (cm) 2.8 cm 10/28/20 1253  Wound Depth (cm) 0.2 cm 10/28/20 1253  Wound Surface Area (cm^2) 16.8 cm^2 10/28/20 1253  Wound Volume (cm^3) 3.36 cm^3 10/28/20 1253  Tunneling (cm) 0 10/15/20 1349  Undermining (cm) 0 10/15/20 1349  Margins Unattached edges (unapproximated) 10/25/20 1410  Drainage Amount Scant 10/28/20 1253  Drainage Description Serous 10/28/20 1253  Treatment Hydrotherapy (Pulse lavage);Packing (Saline gauze) 10/28/20 1253      Hydrotherapy Pulsed lavage therapy - wound location: L flank (upper and lower), L thigh Pulsed Lavage with Suction (psi): 12 psi Pulsed Lavage with Suction - Normal Saline Used: 1000 mL Pulsed Lavage Tip: Tip with splash shield Selective Debridement Selective Debridement - Location: L flank (upper and lower), L thigh Selective Debridement - Tools Used: Scalpel,Forceps Selective Debridement - Tissue Removed: eschar    Wound Assessment and Plan  Wound Therapy - Assess/Plan/Recommendations Wound Therapy - Clinical Statement: Left superior ribcage and left lateral thigh wounds have now evolved to become appropriate to initiate hydrotherapy. Initial layer of eschar removed via sharp debridement. Continuing pulsatil lavage of left inferior ribcage wound. Val Riles, WOC-RN updated orders. Wound Therapy - Functional Problem List: Global weakness in the setting of undetermined time down at home. Factors Delaying/Impairing Wound Healing: Infection - systemic/local,Immobility Hydrotherapy Plan: Debridement,Dressing change,Patient/family education,Pulsatile lavage with suction Wound Therapy - Frequency: 6X / week Wound Therapy - Current Recommendations: PT Wound Therapy - Follow Up Recommendations: dressing changes by RN  Wound Therapy Goals- Improve the function of patient's integumentary system by  progressing the wound(s) through the phases of wound healing (inflammation - proliferation - remodeling) by: Wound Therapy Goals - Improve the function of patient's integumentary system by progressing the wound(s) through the phases of wound healing by: Decrease Necrotic Tissue to: 20% Decrease Necrotic Tissue - Progress: Progressing toward goal Increase Granulation Tissue to: 80% Increase Granulation Tissue - Progress: Progressing toward goal Goals/treatment plan/discharge plan were made with and agreed upon by patient/family: No, Patient unable to participate in goals/treatment/discharge plan and family unavailable Time For Goal Achievement: 7 days Wound Therapy - Potential for Goals: Fair  Goals will be updated until maximal potential achieved or discharge criteria met.  Discharge criteria: when goals achieved, discharge from hospital, MD decision/surgical intervention, no progress towards goals, refusal/missing three consecutive treatments without notification or medical reason.  GP     Charges PT Wound Care Charges $Wound Debridement up to 20 cm: < or equal to 20 cm $ Wound Debridement each add'l 20 sqcm: 6 $PT PLS Gun and Tip: 1 Supply $PT Hydrotherapy Visit: 2 Visits  Wyona Almas, PT, DPT Acute Rehabilitation Services Pager [phone] Office [phone]       Deno Etienne 10/28/2020, 1:09 PM

## 2020-10-28 NOTE — Progress Notes (Signed)
Progress Note    Raymond Stone   ZOX:[id number]  DOB: 11/15/1950  DOA: 10/12/2020     16  PCP: [name], NP  CC: found down   Hospital Course: Mr. Caras admitted to the hospital with working diagnosis hypovolemic shock complicated by acute kidney injury/ATN. 70 year old male past medical history of hypertension who was found down, apparently he was not seen for about 5 days. When EMS arrived he was severely hypotensive, his temperature was 100 F he received intravenous fluids and transported to the emergency room.   Sodium 172, potassium 7.3, chloride>130,bicarb 15, glucose 156, BUN 211, creatinine 11.2, AST 118, ALT 42, total bilirubin is 1.5, troponin I 489-395, lactic acid 5.3. anion gap 49. ABG, pH 7.37, PCO2 26, PO2 96, bicarb 15 White cell count 18.7, hemoglobin 11.1, hematocrit 37.6, platelets 346. SARS COVID-19 negative. Urinalysis specific gravity 1.026, negative protein, 21-50 white cells, 0-5 red cells. Toxicology negative. Head CT negative for acute changes.  Chest radiograph no infiltrates. EKG had 19 bpm, normal axis, normal intervals, sinus rhythm, no ST segment or T wave changes. Patient was diagnosed with shock, he received intravenous fluids and broad-spectrum antibiotic therapy. Patient underwent CRRT04/03with further improvement of his kidney function. Peak CK 2,415 mild rhabdomyolysis likely not the culprit of renal failure. Patient very debilitated.  Patient with persistent fever despite broad spectrum antibiotic therapy, getting CT abdomen and pelvis which showed no acute intra-abdominal pathology.  Due to ongoing poor oral intake, we discussed need for at least temporary tube feeding.  Patient was amenable.  Coretrak was placed on 10/25/2020 and tube feeds initiated.  Interval History:  No events overnight.  No further fever documented since about 2 PM yesterday.  He was a little more confused than usual this morning when seen but  otherwise resting comfortably in his normal state.  Overall, he is just very significantly deconditioned.  He was not able to do much with therapy today.  ROS: Constitutional: negative for chills and fevers, Respiratory: negative for cough, Cardiovascular: negative for chest pain and Gastrointestinal: negative for abdominal pain  Assessment & Plan: Chronic metabolic encephalopathy  Uremic encephalopathy - resolved  Advanced cerebral atrophy Adult failure to thrive -metabolic derangements corrected since admission and he is technically oriented (although some worsening at times) but he is not thriving well at all; does not eat much and is so weak/deconditioned he may not have the physical reserve to survive this illness - for now, attempting TF to strengthen/re-condition as able; if fails, he would be more appropriate for hospice/comfort care and end of life discussions with his daughter; Palliative care greatly assisting - s/p cortrak 4/15 and TF started on 10/25/20 - at this time I believe best plan to be: ~2 more days on enteral TF via cortrak. Then perform a calorie count over 48 hours. If nutrition not adequate for needs then I do not think a PEG is in patient's best interest given his overall poor quality of life (currently bed bound, multiple wounds/pressure ulcers, advanced cerebral atrophy, hx etoh abuse, ongoing encephalopathy, increased risk of aspiration). At which time, discussion should be held with daughter to pursue DNR/DNI and pursuit of comfort care/hospice  Moderatecalorie protein malnutrition Poor oral intake - Continue with nutritional supplements, multivitamins, and thiamine. -Continue dysphagia diet; not taking in much nutrition and at risk for further decline/complications; ongoing [place] with daughter; [facility] assisting discussions - agree with trial of mirtazapine - Cortrak placed on 4/15 and TF initiated; see discussion above  Fever -  Afebrile now ~24 hours. No atelectasis  or infiltrates on CXR. Abdomen benign as well. Multiple wounds on body but has been on several days of abx - differential includes drug fever vs central fever vs early atelectasis (bed bound) vs wounds vs other - BCx from 4/17 NGTD when patient had fever; has an external catheter in place and no PICC or central access - hold off on abx  Hypovolemic shock with AKI/ATN Hyperkalemia/hypokalemia Hypernatremia - 172 on admission AGMA 2/2 renal failure Hypomagnesemia - s/p volume resuscitation on admission followed by CRRT - renal function now normalized  Sepsis 2/2 wounds (not present on admission). Patient with complex wounds, left hemithorax and left thigh - s/p abx course x 5 days - continue ongoing wound care - see failure to thrive  HTN. -Continue Lopressor  Alcohol abuse - continue folic, thiamine, MVI - PRN benzo  Aspiration pneumonitis (present on admission)  Continue with aspiration precautions.  Old records reviewed in assessment of this patient  Antimicrobials: Zosyn 4/3 - 4/4 Rocephin 4/10 >> 4/15 Vanc 4/10 >> 4/15  DVT prophylaxis: enoxaparin (LOVENOX) injection 40 mg Start: 10/17/20 1530 SCDs Start: 10/12/20 2213   Code Status:   Code Status: Full Code Family Communication:   Disposition Plan: Status is: Inpatient  Remains inpatient appropriate because:IV treatments appropriate due to intensity of illness or inability to take PO and Inpatient level of care appropriate due to severity of illness   Dispo: The patient is from: Home              Anticipated d/c is to: SNF              Patient currently is not medically stable to d/c.   Difficult to place patient No  Risk of unplanned readmission score: Unplanned Admission- Pilot do not use: 15.5   Objective: Blood pressure 122/71, pulse 93, temperature 98.7 F (37.1 C), temperature source Oral, resp. rate 20, height 5\' 9"  (1.753 m), weight 73.9 kg, SpO2 99 %.  Examination: General appearance:  Chronically ill-appearing and globally weak/deconditioned man laying in bed Head: Normocephalic, without obvious abnormality, atraumatic Eyes: EOMI  Nose: cortrak in place Lungs: coarse sounds bilaterally, no rhonchi or wheezing Heart: regular rate and rhythm and S1, S2 normal Abdomen: normal findings: bowel sounds normal and soft, non-tender Extremities: edematous Skin: scattered excoriations and dry skin Neurologic: worsening LE weakness compared to UE but follows commands and moves all 4 extremities (minimal in LE)  Consultants:   PCM  Procedures:     Data Reviewed: I have personally reviewed following labs and imaging studies Results for orders placed or performed during the hospital encounter of 10/12/20 (from the past 24 hour(s))  Glucose, capillary     Status: Abnormal   Collection Time: 10/27/20  6:11 PM  Result Value Ref Range   Glucose-Capillary 126 (H) 70 - 99 mg/dL  Glucose, capillary     Status: Abnormal   Collection Time: 10/27/20  8:26 PM  Result Value Ref Range   Glucose-Capillary 133 (H) 70 - 99 mg/dL  Glucose, capillary     Status: Abnormal   Collection Time: 10/28/20 12:14 AM  Result Value Ref Range   Glucose-Capillary 129 (H) 70 - 99 mg/dL  Glucose, capillary     Status: Abnormal   Collection Time: 10/28/20  4:32 AM  Result Value Ref Range   Glucose-Capillary 142 (H) 70 - 99 mg/dL  CBC with Differential/Platelet     Status: Abnormal   Collection Time: 10/28/20  5:40  AM  Result Value Ref Range   WBC 10.8 (H) 4.0 - 10.5 K/uL   RBC 2.73 (L) 4.22 - 5.81 MIL/uL   Hemoglobin 8.9 (L) 13.0 - 17.0 g/dL   HCT 27.3 (L) 39.0 - 52.0 %   MCV 100.0 80.0 - 100.0 fL   MCH 32.6 26.0 - 34.0 pg   MCHC 32.6 30.0 - 36.0 g/dL   RDW 13.8 11.5 - 15.5 %   Platelets 418 (H) 150 - 400 K/uL   nRBC 0.0 0.0 - 0.2 %   Neutrophils Relative % 74 %   Neutro Abs 7.9 (H) 1.7 - 7.7 K/uL   Lymphocytes Relative 12 %   Lymphs Abs 1.3 0.7 - 4.0 K/uL   Monocytes Relative 9 %    Monocytes Absolute 1.0 0.1 - 1.0 K/uL   Eosinophils Relative 4 %   Eosinophils Absolute 0.4 0.0 - 0.5 K/uL   Basophils Relative 0 %   Basophils Absolute 0.0 0.0 - 0.1 K/uL   Immature Granulocytes 1 %   Abs Immature Granulocytes 0.14 (H) 0.00 - 0.07 K/uL  Basic metabolic panel     Status: Abnormal   Collection Time: 10/28/20  5:40 AM  Result Value Ref Range   Sodium 131 (L) 135 - 145 mmol/L   Potassium 4.5 3.5 - 5.1 mmol/L   Chloride 100 98 - 111 mmol/L   CO2 25 22 - 32 mmol/L   Glucose, Bld 131 (H) 70 - 99 mg/dL   BUN 19 8 - 23 mg/dL   Creatinine, Ser 0.66 0.61 - 1.24 mg/dL   Calcium 10.1 8.9 - 10.3 mg/dL   GFR, Estimated >60 >60 mL/min   Anion gap 6 5 - 15  Magnesium     Status: Abnormal   Collection Time: 10/28/20  5:40 AM  Result Value Ref Range   Magnesium 1.5 (L) 1.7 - 2.4 mg/dL  Glucose, capillary     Status: Abnormal   Collection Time: 10/28/20  7:54 AM  Result Value Ref Range   Glucose-Capillary 134 (H) 70 - 99 mg/dL  Glucose, capillary     Status: Abnormal   Collection Time: 10/28/20 11:24 AM  Result Value Ref Range   Glucose-Capillary 146 (H) 70 - 99 mg/dL  Glucose, capillary     Status: Abnormal   Collection Time: 10/28/20  4:08 PM  Result Value Ref Range   Glucose-Capillary 126 (H) 70 - 99 mg/dL    Recent Results (from the past 240 hour(s))  Culture, blood (routine x 2)     Status: None   Collection Time: 10/20/20  1:34 AM   Specimen: BLOOD  Result Value Ref Range Status   Specimen Description BLOOD LEFT HAND  Final   Special Requests   Final    BOTTLES DRAWN AEROBIC AND ANAEROBIC Blood Culture adequate volume   Culture   Final    NO GROWTH 5 DAYS Performed at [facility], [address], [place], [place] [address]    Report Status 10/25/2020 FINAL  Final  Culture, blood (routine x 2)     Status: None   Collection Time: 10/20/20  1:43 AM   Specimen: BLOOD  Result Value Ref Range Status   Specimen Description BLOOD RIGHT HAND  Final   Special  Requests   Final    BOTTLES DRAWN AEROBIC AND ANAEROBIC Blood Culture adequate volume   Culture   Final    NO GROWTH 5 DAYS Performed at [facility], [place],  [place] [address]    Report Status 10/25/2020 FINAL  Final  Culture, blood (routine x 2)     Status: None (Preliminary result)   Collection Time: 10/27/20  1:22 PM   Specimen: BLOOD  Result Value Ref Range Status   Specimen Description BLOOD RIGHT ANTECUBITAL  Final   Special Requests   Final    BOTTLES DRAWN AEROBIC ONLY Blood Culture adequate volume   Culture   Final    NO GROWTH < 24 HOURS Performed at [facility], [place] [address], [place], [place] [address]    Report Status PENDING  Incomplete  Culture, blood (routine x 2)     Status: None (Preliminary result)   Collection Time: 10/27/20  1:22 PM   Specimen: BLOOD  Result Value Ref Range Status   Specimen Description BLOOD LEFT ANTECUBITAL  Final   Special Requests   Final    BOTTLES DRAWN AEROBIC ONLY Blood Culture adequate volume   Culture   Final    NO GROWTH < 24 HOURS Performed at [facility], [place] [address], [place], [place] [address]    Report Status PENDING  Incomplete     Radiology Studies: DG CHEST PORT 1 VIEW  Result Date: 10/27/2020 CLINICAL DATA:  Shortness of breath, fever, abdominal discomfort. EXAM: PORTABLE CHEST 1 VIEW COMPARISON:  Chest x-rays dated 10/20/2020 and 10/13/2020. FINDINGS: Enteric tube passes below the diaphragm. Heart size and mediastinal contours are stable. Lungs are clear. No pleural effusion or pneumothorax is seen. Osseous structures about the chest are unremarkable. IMPRESSION: No active disease. No evidence of pneumonia, atelectasis or pulmonary edema. Electronically Signed   By: Franki Cabot M.D.   On: 10/27/2020 09:24   DG Abd Portable 1V  Result Date: 10/27/2020 CLINICAL DATA:  Abdominal discomfort, fever, shortness of breath. EXAM: PORTABLE ABDOMEN - 1 VIEW COMPARISON:  CT abdomen dated  10/23/2020. FINDINGS: Enteric tube appears adequately positioned in the stomach with tip directed towards the pylorus/duodenal bulb. Bowel gas pattern is nonobstructive. No dilated large or small bowel loops are seen. No evidence of free intraperitoneal air. No evidence of renal or ureteral calculi. IMPRESSION: 1. Enteric tube appears adequately positioned in the stomach with tip directed towards the pylorus/duodenal bulb. 2. Nonobstructive bowel gas pattern. Electronically Signed   By: Franki Cabot M.D.   On: 10/27/2020 09:22   DG Abd Portable 1V  Final Result    DG CHEST PORT 1 VIEW  Final Result    CT ABDOMEN PELVIS W CONTRAST  Final Result    DG CHEST PORT 1 VIEW  Final Result    DG CHEST PORT 1 VIEW  Final Result    US RENAL  Final Result    CT Head Wo Contrast  Final Result    DG Chest Port 1 View  Final Result      Scheduled Meds: . collagenase   Topical Daily  . enoxaparin (LOVENOX) injection  40 mg Subcutaneous Q24H  . feeding supplement  237 mL Oral TID WC & HS  . feeding supplement (PROSource TF)  45 mL Per Tube BID  . folic acid  1 mg Per Tube Daily  . magnesium oxide  400 mg Per Tube BID  . mouth rinse  15 mL Mouth Rinse BID  . metoprolol tartrate  50 mg Per Tube BID  . mirtazapine  15 mg Oral QHS  . multivitamin  15 mL Per Tube Daily  . sennosides  5 mL Per Tube QHS  .  thiamine  100 mg Per Tube Daily   PRN Meds: sodium chloride, acetaminophen (TYLENOL) oral liquid 160 mg/5 mL, lip balm, LORazepam, ondansetron (ZOFRAN) IV, polyethylene glycol, polyvinyl alcohol Continuous Infusions: . sodium chloride 1,000 mL (10/21/20 1514)  . feeding supplement (OSMOLITE 1.5 CAL) 1,000 mL (10/28/20 1601)     LOS: 16 days  Time spent: Greater than 50% of the 35 minute visit was spent in counseling/coordination of care for the patient as laid out in the A&P.   Dwyane Dee, MD [facility] 10/28/2020, 5:11 PM

## 2020-10-28 NOTE — Consult Note (Signed)
[facility] Nurse wound follow up C. Owens Shark, PT, is recommending hydrotherapy and santyl to left flank and left thigh wounds, 6 days/week.  I have entered this order.  Apply Santyl to two wounds on left flank and left thigh in a nickel thick layer. Cover with a saline moistened gauze, then dry gauze or ABD pad.  Change daily. PRIMARY RN TO DO ON DAYS PT IS NOT PERFORMING HYDROTHERAPY.  Val Riles, RN, MSN, CWOCN, CNS-BC, pager [phone]

## 2020-10-28 NOTE — Progress Notes (Signed)
Physical Therapy Treatment Patient Details Name: Raymond Stone MRN: [id number] DOB: 30-Jul-1950 Today's Date: 10/28/2020    History of Present Illness Pt is a 70 y/o male admitted 10/12/20 after being found down for unkown length of time at home (apparently pt had not been seen for ~5 days). Workup for several pressure injuries, hypovolemic shock complicated by acute kidney injury. Underwent CRRT 4/3. Poor oral intake s/p cortrak 4/15. PMH includes HTN, alcohol dependence, malnutrition.   PT Comments    Planned to attempt OOB mobility with maximove lift this session. Pt requires maxA+2 to [place] for bed-level mobility. Pt unaware of bowel incontinence, requiring maxA+2 for multiple bouts of rolling R/L for washup and linen change; pt able to assist minimally with BUEs with verbal/tactile cues; little to no active BLE movement noted this session. Pt with increased fatigue/lethargy and persistent bowel incontinence, therefore OOB mobility deferred. Pt remains limited by diffuse weakness, decreased activity tolerance and cognitive impairment, including disorientation, decreased awareness, poor attention and difficulty problem solving. Will continue to follow acutely to address established goals.    Follow Up Recommendations  SNF;Supervision/Assistance - 24 hour     Equipment Recommendations  Wheelchair (measurements PT);Wheelchair cushion (measurements PT);Hospital bed (hoyer lift)    Recommendations for Other Services       Precautions / Restrictions Precautions Precautions: Fall;Other (comment) Precaution Comments: Bladder/bowel incontinence, multiple wounds; cortrak Required Braces or Orthoses: Other Brace Other Brace: Bilateral PRAFOs Restrictions Weight Bearing Restrictions: No    Mobility  Bed Mobility Overal bed mobility: Needs Assistance Bed Mobility: Rolling Rolling: Max assist;+2 for physical assistance         General bed mobility comments: Planned to attempt maximove  lift to recliner. pt unaware of bowel incontinence, requiring multiple bouts of rolling R/L for pericare and linen change; pt required maxA+2 for rolling, able to assist minimally with UE support when UE positioned at bed rail; totalA for scooting up and repositioning in supine. Pt with continued bowel incontinence and becoming increasingly fatigued with bed-level activity; deferred OOB lift this session    Transfers                    Ambulation/Gait                 Stairs             Wheelchair Mobility    Modified Rankin (Stroke Patients Only)       Balance                                            Cognition Arousal/Alertness: Awake/alert;Lethargic Behavior During Therapy: Flat affect Overall Cognitive Status: Impaired/Different from baseline Area of Impairment: Orientation;Memory;Following commands;Safety/judgement;Awareness;Problem solving                 Orientation Level: Disoriented to;Place;Time;Situation Current Attention Level: Focused;Sustained Memory: Decreased recall of precautions;Decreased short-term memory Following Commands: Follows one step commands with increased time;Follows one step commands inconsistently Safety/Judgement: Decreased awareness of safety;Decreased awareness of deficits Awareness: Intellectual Problem Solving: Slow processing;Decreased initiation;Requires tactile cues;Requires verbal cues General Comments: Initially awake/alert and conversant throughout session; with increased mobility, pt keeping eyes closed apparent becoming more fatigued. Pt adamant about calling sister, attempting to remember phone # with incorrect number of digits, unable to dial numbers. Pt had visitor (friend "Legrand Como") - pt initially not able to state who he was,  once face mask of pt stating it is his cousin "Redd", pt not interacting with visitor. Pt assuming this PT knew about his properties and why he needed money.  Multiple attempts to reorient pt to current situation and location      Exercises      General Comments General comments (skin integrity, edema, etc.): Post-activity BP 122/75, HR 106, SpO2 99% on RA      Pertinent Vitals/Pain Pain Assessment: Faces Faces Pain Scale: Hurts little more Pain Location: Generalized with bed-level mobility, pt unable to specify Pain Descriptors / Indicators: Grimacing;Moaning Pain Intervention(s): Monitored during session;Limited activity within patient's tolerance;Repositioned    Home Living                      Prior Function            PT Goals (current goals can now be found in the care plan section) Progress towards PT goals: Not progressing toward goals - comment    Frequency    Min 2X/week      PT Plan Current plan remains appropriate    Co-evaluation              AM-PAC PT "6 Clicks" Mobility   Outcome Measure  Help needed turning from your back to your side while in a flat bed without using bedrails?: Total Help needed moving from lying on your back to sitting on the side of a flat bed without using bedrails?: Total Help needed moving to and from a bed to a chair (including a wheelchair)?: Total Help needed standing up from a chair using your arms (e.g., wheelchair or bedside chair)?: Total Help needed to walk in hospital room?: Total Help needed climbing 3-5 steps with a railing? : Total 6 Click Score: 6    End of Session   Activity Tolerance: Patient tolerated treatment well;Patient limited by fatigue Patient left: in bed;with call bell/phone within reach;with bed alarm set Nurse Communication: Mobility status;Need for lift equipment PT Visit Diagnosis: History of falling (Z91.81);Muscle weakness (generalized) (M62.81);Adult, failure to thrive (R62.7)     Time: 1430-1500 PT Time Calculation (min) (ACUTE ONLY): 30 min  Charges:  $Therapeutic Activity: 23-37 mins                     Mabeline Caras, PT,  DPT Acute Rehabilitation Services  Pager [phone] Office [place] 10/28/2020, 3:24 PM

## 2020-10-28 NOTE — Progress Notes (Signed)
   10/28/20 2041  Assess: MEWS Score  Temp (!) 101.5 F (38.6 C)  BP 112/61  ECG Heart Rate (!) 128  Assess: MEWS Score  MEWS Temp 2  MEWS Systolic 0  MEWS Pulse 2  MEWS RR 0  MEWS LOC 0  MEWS Score 4  MEWS Score Color Red  Assess: if the MEWS score is Yellow or Red  Were vital signs taken at a resting state? Yes  Focused Assessment No change from prior assessment  Early Detection of Sepsis Score *See Row Information* Low  MEWS guidelines implemented *See Row Information* No, previously red, continue vital signs every 4 hours  Document  Progress note created (see row info) Yes

## 2020-10-28 NOTE — Progress Notes (Signed)
  Speech Language Pathology Treatment: Dysphagia  Patient Details Name: Raymond Stone MRN: [id number] DOB: 11/17/1950 Today's Date: 10/28/2020 Time: 8185-6314 SLP Time Calculation (min) (ACUTE ONLY): 10 min  Assessment / Plan / Recommendation Clinical Impression  Pt continues to require encouragement for PO intake with SLP. He was more agreeable to sips of water but also accepted one small bite of a graham cracker. Anterior spillage occurred x1 with liquids and prolonged mastication occurred with solids. He benefited from a liquid wash following solid PO trial to clear oral residue. No overt s/s of aspiration occurred across all trials of all consistencies even while not sitting fully upright. Continue to recommend pt's current diet and SLP will continue to follow for potential diet upgrade.    HPI HPI: 70 yo man with hx of HTN, lives alone, found down after not being seen for five days and admitted 4/2. Dx acute metabolic encephalopathy, acute kidney injury, hypovolemic shock, rhabdomylosis. BSE completed on 4/8 and after diet check by SLP, patient was discharged from speech services because of tolerating regular solids, thin liquids. BSE reordered on 4/11 as nursing observing patient to cough frequently with even smal sips of water.      SLP Plan  Continue with current plan of care       Recommendations  Diet recommendations: Dysphagia 2 (fine chop);Thin liquid Liquids provided via: Cup;Straw Medication Administration: Whole meds with liquid Supervision: Full supervision/cueing for compensatory strategies;Staff to assist with self feeding Compensations: Slow rate;Small sips/bites Postural Changes and/or Swallow Maneuvers: Seated upright 90 degrees                Oral Care Recommendations: Oral care QID Follow up Recommendations: Skilled Nursing facility;24 hour supervision/assistance SLP Visit Diagnosis: Dysphagia, unspecified (R13.10) Plan: Continue with current plan of  care       GO                Jeanine Luz., SLP Student 10/28/2020, 12:27 PM

## 2020-10-29 DIAGNOSIS — R571 Hypovolemic shock: Secondary | ICD-10-CM | POA: Diagnosis not present

## 2020-10-29 DIAGNOSIS — G9341 Metabolic encephalopathy: Secondary | ICD-10-CM | POA: Diagnosis not present

## 2020-10-29 DIAGNOSIS — G319 Degenerative disease of nervous system, unspecified: Secondary | ICD-10-CM | POA: Diagnosis not present

## 2020-10-29 DIAGNOSIS — R627 Adult failure to thrive: Secondary | ICD-10-CM | POA: Diagnosis not present

## 2020-10-29 LAB — CBC WITH DIFFERENTIAL/PLATELET
Abs Immature Granulocytes: 0.13 10*3/uL — ABNORMAL HIGH (ref 0.00–0.07)
Basophils Absolute: 0.1 10*3/uL (ref 0.0–0.1)
Basophils Relative: 1 %
Eosinophils Absolute: 0.6 10*3/uL — ABNORMAL HIGH (ref 0.0–0.5)
Eosinophils Relative: 5 %
HCT: 24.1 % — ABNORMAL LOW (ref 39.0–52.0)
Hemoglobin: 7.9 g/dL — ABNORMAL LOW (ref 13.0–17.0)
Immature Granulocytes: 1 %
Lymphocytes Relative: 15 %
Lymphs Abs: 1.6 10*3/uL (ref 0.7–4.0)
MCH: 32 pg (ref 26.0–34.0)
MCHC: 32.8 g/dL (ref 30.0–36.0)
MCV: 97.6 fL (ref 80.0–100.0)
Monocytes Absolute: 1.2 10*3/uL — ABNORMAL HIGH (ref 0.1–1.0)
Monocytes Relative: 11 %
Neutro Abs: 7.3 10*3/uL (ref 1.7–7.7)
Neutrophils Relative %: 67 %
Platelets: 409 10*3/uL — ABNORMAL HIGH (ref 150–400)
RBC: 2.47 MIL/uL — ABNORMAL LOW (ref 4.22–5.81)
RDW: 13.8 % (ref 11.5–15.5)
WBC: 10.9 10*3/uL — ABNORMAL HIGH (ref 4.0–10.5)
nRBC: 0 % (ref 0.0–0.2)

## 2020-10-29 LAB — BASIC METABOLIC PANEL
Anion gap: 9 (ref 5–15)
BUN: 25 mg/dL — ABNORMAL HIGH (ref 8–23)
CO2: 24 mmol/L (ref 22–32)
Calcium: 10 mg/dL (ref 8.9–10.3)
Chloride: 96 mmol/L — ABNORMAL LOW (ref 98–111)
Creatinine, Ser: 0.91 mg/dL (ref 0.61–1.24)
GFR, Estimated: >60 mL/min (ref >60)
Glucose, Bld: 134 mg/dL — ABNORMAL HIGH (ref 70–99)
Potassium: 4.6 mmol/L (ref 3.5–5.1)
Sodium: 129 mmol/L — ABNORMAL LOW (ref 135–145)

## 2020-10-29 LAB — GLUCOSE, CAPILLARY
Glucose-Capillary: 136 mg/dL — ABNORMAL HIGH (ref 70–99)
Glucose-Capillary: 134 mg/dL — ABNORMAL HIGH (ref 70–99)
Glucose-Capillary: 141 mg/dL — ABNORMAL HIGH (ref 70–99)
Glucose-Capillary: 149 mg/dL — ABNORMAL HIGH (ref 70–99)
Glucose-Capillary: 148 mg/dL — ABNORMAL HIGH (ref 70–99)
Glucose-Capillary: 144 mg/dL — ABNORMAL HIGH (ref 70–99)

## 2020-10-29 LAB — VITAMIN B1: Vitamin B1 (Thiamine): 55.7 nmol/L — ABNORMAL LOW (ref 66.5–200.0)

## 2020-10-29 LAB — PHOSPHORUS: Phosphorus: 3.3 mg/dL (ref 2.5–4.6)

## 2020-10-29 LAB — MAGNESIUM: Magnesium: 1.6 mg/dL — ABNORMAL LOW (ref 1.7–2.4)

## 2020-10-29 NOTE — Progress Notes (Signed)
Occupational Therapy Treatment Patient Details Name: Raymond Stone MRN: [id number] DOB: 1950-12-31 Today's Date: 10/29/2020    History of present illness Pt is a 70 y/o male admitted 10/12/20 after being found down for unkown length of time at home (apparently pt had not been seen for ~5 days). Workup for several pressure injuries, hypovolemic shock complicated by acute kidney injury. Underwent CRRT 4/3. Poor oral intake s/p cortrak 4/15. PMH includes HTN, alcohol dependence, malnutrition.   OT comments  Pt demonstrating increased confusion/cognitive deficits this session. Pt requires max verbal and tactile cueing to participate in therapy. Pt completed UE exercises, providing active assistance part of the time. OT handed pt a wash cloth to assist with washing his face, however pt was unable to bring his hands to his face due to pain and resistance. Pt will continue to benefit from OT to work towards lower levels of assistance with ADL's.   Follow Up Recommendations  SNF;Supervision/Assistance - 24 hour    Equipment Recommendations  Other (comment) (defer to next venue)    Recommendations for Other Services      Precautions / Restrictions Precautions Precautions: Fall;Other (comment) Precaution Comments: Bladder/bowel incontinence, multiple wounds; cortrak Required Braces or Orthoses: Other Brace Other Brace: Bilateral PRAFOs       Mobility Bed Mobility Overal bed mobility: Needs Assistance Bed Mobility: Rolling Rolling: Total assist;+2 for physical assistance         General bed mobility comments: Pt repositioned to provide pressure relief, requiring total assist +2 to roll to 1 side.    Transfers                 General transfer comment: Pt unable to stand/transfer at this time due to overall weakness and confusion.    Balance                                           ADL either performed or assessed with clinical judgement   ADL Overall  ADL's : Needs assistance/impaired Eating/Feeding: Total assistance;Bed level Eating/Feeding Details (indicate cue type and reason): Pt NPO at this time Grooming: Wash/dry face;Bed level;Total assistance Grooming Details (indicate cue type and reason): Pt unable to bring hands to face even with max assist from OT                               General ADL Comments: Pt confused, unable to follow commands to move UE, however would move BUE in reaction to OT placing a wash cloth in his hands and putting lotion on his hands.     Vision       Perception     Praxis      Cognition Arousal/Alertness: Lethargic Behavior During Therapy: Agitated;Flat affect Overall Cognitive Status: Impaired/Different from baseline Area of Impairment: Orientation;Memory;Following commands;Safety/judgement;Awareness;Problem solving                 Orientation Level: Disoriented to;Place;Time;Situation Current Attention Level: Alternating Memory: Decreased recall of precautions;Decreased short-term memory Following Commands: Follows one step commands inconsistently;Follows one step commands with increased time Safety/Judgement: Decreased awareness of safety;Decreased awareness of deficits Awareness: Intellectual Problem Solving: Slow processing;Decreased initiation;Requires tactile cues;Requires verbal cues General Comments: Pt very confused throughout session. Stating he had a gun and he was going to shoot for bothering him.        Exercises  General Exercises - Upper Extremity Shoulder Flexion: Both;10 reps;Supine;AAROM Shoulder ABduction: Both;AAROM;10 reps;Supine Elbow Flexion: AAROM;Both;Supine;10 reps Elbow Extension: AAROM;Both;10 reps;Supine Wrist Flexion: PROM;5 reps;Both;Supine Wrist Extension: PROM;Both;5 reps;Supine   Shoulder Instructions       General Comments      Pertinent Vitals/ Pain       Pain Assessment: Faces Faces Pain Scale: Hurts even more Pain  Location: Generalized with bed-level mobility, pt unable to specify Pain Descriptors / Indicators: Grimacing;Moaning Pain Intervention(s): Repositioned;Monitored during session  Home Living                                          Prior Functioning/Environment              Frequency  Min 2X/week        Progress Toward Goals  OT Goals(current goals can now be found in the care plan section)  Progress towards OT goals: Progressing toward goals (Limited)  Acute Rehab OT Goals Patient Stated Goal: None stated OT Goal Formulation: Patient unable to participate in goal setting Time For Goal Achievement: 11/01/20 Potential to Achieve Goals: Fair ADL Goals Pt Will Perform Eating: with set-up;sitting;bed level Pt Will Perform Grooming: with min assist;sitting Pt Will Perform Upper Body Dressing: with min assist;sitting Pt Will Transfer to Toilet: with mod assist;bedside commode;stand pivot transfer  Plan Discharge plan remains appropriate;Frequency remains appropriate    Co-evaluation                 AM-PAC OT "6 Clicks" Daily Activity     Outcome Measure   Help from another person eating meals?: Total Help from another person taking care of personal grooming?: Total Help from another person toileting, which includes using toliet, bedpan, or urinal?: Total Help from another person bathing (including washing, rinsing, drying)?: Total Help from another person to put on and taking off regular upper body clothing?: Total Help from another person to put on and taking off regular lower body clothing?: Total 6 Click Score: 6    End of Session    OT Visit Diagnosis: Unsteadiness on feet (R26.81);Muscle weakness (generalized) (M62.81)   Activity Tolerance No increased pain   Patient Left in bed;with call bell/phone within reach;with bed alarm set   Nurse Communication Mobility status        Time: 9244-6286 OT Time Calculation (min): 14  min  Charges: OT General Charges $OT Visit: 1 Visit OT Treatments $Therapeutic Activity: 8-22 mins  Cai Flott H., OTR/L Acute Rehabilitation  Launa Goedken Elane Katena Petitjean 10/29/2020, 4:40 PM

## 2020-10-29 NOTE — Progress Notes (Signed)
Physical Therapy Wound Treatment Patient Details  Name: Raymond Stone MRN: [id number] Date of Birth: August 01, 1950  Today's Date: 10/29/2020 Time: 0911-1002 Time Calculation (min): 51 min  Subjective  Subjective Assessment Subjective: "Wait a minute." Patient and Family Stated Goals: None stated Prior Treatments: Dressing changes prior to hydro being consulted.  Pain Score:  6/10 (with rolling)  Wound Assessment  Pressure Injury 10/13/20 Thigh Anterior;Left;Proximal Unstageable - Full thickness tissue loss in which the base of the injury is covered by slough (yellow, tan, gray, green or brown) and/or eschar (tan, brown or black) in the wound bed. (Active)  Dressing Type ABD;Barrier Film (skin prep);Gauze (Comment);Moist to dry;Santyl 10/29/20 1008  Dressing Changed;Clean;Dry;Intact 10/29/20 1008  Dressing Change Frequency Daily 10/29/20 1008  State of Healing Eschar 10/29/20 1008  Site / Wound Assessment Pink;Black;Yellow 10/29/20 1008  % Wound base Red or Granulating 5% 10/29/20 1008  % Wound base Yellow/Fibrinous Exudate 60% 10/29/20 1008  % Wound base Black/Eschar 35% 10/29/20 1008  % Wound base Other/Granulation Tissue (Comment) 0% 10/29/20 1008  Peri-wound Assessment Intact 10/29/20 1008  Wound Length (cm) 12.5 cm 10/28/20 1253  Wound Width (cm) 7 cm 10/28/20 1253  Wound Depth (cm) 0.1 cm 10/28/20 1253  Wound Surface Area (cm^2) 87.5 cm^2 10/28/20 1253  Wound Volume (cm^3) 8.75 cm^3 10/28/20 1253  Tunneling (cm) 0 10/15/20 1349  Undermining (cm) 0 10/15/20 1349  Margins Unattached edges (unapproximated) 10/29/20 1008  Drainage Amount Minimal 10/29/20 1008  Drainage Description Serosanguineous;Green;Odor 10/29/20 1008  Treatment Debridement (Selective);Hydrotherapy (Pulse lavage);Packing (Saline gauze) 10/29/20 1008     Pressure Injury 10/13/20 Flank Left;Upper Unstageable - Full thickness tissue loss in which the base of the injury is covered by slough (yellow, tan, gray,  green or brown) and/or eschar (tan, brown or black) in the wound bed. (Active)  Dressing Type Barrier Film (skin prep);Foam - Lift dressing to assess site every shift;Gauze (Comment);Moist to dry;Santyl 10/29/20 1008  Dressing Changed;Clean;Dry;Intact 10/29/20 1008  Dressing Change Frequency Daily 10/29/20 1008  State of Healing Eschar 10/29/20 1008  Site / Wound Assessment Yellow;Pink;Black 10/29/20 1008  % Wound base Red or Granulating 5% 10/29/20 1008  % Wound base Yellow/Fibrinous Exudate 50% 10/29/20 1008  % Wound base Black/Eschar 45% 10/29/20 1008  % Wound base Other/Granulation Tissue (Comment) 0% 10/29/20 1008  Peri-wound Assessment Intact 10/29/20 1008  Wound Length (cm) 5 cm 10/28/20 1253  Wound Width (cm) 8 cm 10/28/20 1253  Wound Depth (cm) 0.1 cm 10/28/20 1253  Wound Surface Area (cm^2) 40 cm^2 10/28/20 1253  Wound Volume (cm^3) 4 cm^3 10/28/20 1253  Margins Unattached edges (unapproximated) 10/29/20 1008  Drainage Amount Scant 10/29/20 1008  Drainage Description Serous 10/29/20 1008  Treatment Debridement (Selective);Hydrotherapy (Pulse lavage);Packing (Saline gauze) 10/29/20 1008     Pressure Injury 10/13/20 Flank Left;Mid Unstageable - Full thickness tissue loss in which the base of the injury is covered by slough (yellow, tan, gray, green or brown) and/or eschar (tan, brown or black) in the wound bed. (Active)  Dressing Type Barrier Film (skin prep);Foam - Lift dressing to assess site every shift;Gauze (Comment);Moist to dry;Santyl 10/29/20 1008  Dressing Changed;Clean;Dry;Intact 10/29/20 1008  Dressing Change Frequency Daily 10/29/20 1008  State of Healing Early/partial granulation 10/29/20 1008  Site / Wound Assessment Red;Yellow 10/29/20 1008  % Wound base Red or Granulating 90% 10/29/20 1008  % Wound base Yellow/Fibrinous Exudate 10% 10/29/20 1008  % Wound base Black/Eschar 0% 10/29/20 1008  % Wound base Other/Granulation Tissue (Comment) 0% 10/29/20 1008  Peri-wound Assessment Excoriated 10/29/20 1008  Wound Length (cm) 6 cm 10/28/20 1253  Wound Width (cm) 2.8 cm 10/28/20 1253  Wound Depth (cm) 0.2 cm 10/28/20 1253  Wound Surface Area (cm^2) 16.8 cm^2 10/28/20 1253  Wound Volume (cm^3) 3.36 cm^3 10/28/20 1253  Tunneling (cm) 0 10/15/20 1349  Undermining (cm) 0 10/15/20 1349  Margins Unattached edges (unapproximated) 10/29/20 1008  Drainage Amount Scant 10/29/20 1008  Drainage Description Serous 10/29/20 1008  Treatment Debridement (Selective);Hydrotherapy (Pulse lavage);Packing (Saline gauze) 10/29/20 1008      Hydrotherapy Pulsed lavage therapy - wound location: L flank (upper and lower), L thigh Pulsed Lavage with Suction (psi): 12 psi Pulsed Lavage with Suction - Normal Saline Used: 1000 mL Pulsed Lavage Tip: Tip with splash shield Selective Debridement Selective Debridement - Location: L flank (upper and lower), L thigh Selective Debridement - Tools Used: Scalpel,Forceps Selective Debridement - Tissue Removed: eschar, yellow slough    Wound Assessment and Plan  Wound Therapy - Assess/Plan/Recommendations Wound Therapy - Clinical Statement: Left superior ribcage and thigh wound are progressing with hydrotherapy. Noted mild green drainage from thigh wound today; will continue to assess.  Continuing pulsatile lavage of left inferior ribcage wound. Wound Therapy - Functional Problem List: Global weakness in the setting of undetermined time down at home. Factors Delaying/Impairing Wound Healing: Infection - systemic/local,Immobility Hydrotherapy Plan: Debridement,Dressing change,Patient/family education,Pulsatile lavage with suction Wound Therapy - Frequency: 6X / week Wound Therapy - Current Recommendations: PT Wound Therapy - Follow Up Recommendations: dressing changes by RN  Wound Therapy Goals- Improve the function of patient's integumentary system by progressing the wound(s) through the phases of wound healing (inflammation -  proliferation - remodeling) by: Wound Therapy Goals - Improve the function of patient's integumentary system by progressing the wound(s) through the phases of wound healing by: Decrease Necrotic Tissue to: 20% Decrease Necrotic Tissue - Progress: Progressing toward goal Increase Granulation Tissue to: 80% Increase Granulation Tissue - Progress: Progressing toward goal Goals/treatment plan/discharge plan were made with and agreed upon by patient/family: No, Patient unable to participate in goals/treatment/discharge plan and family unavailable Time For Goal Achievement: 7 days Wound Therapy - Potential for Goals: Fair  Goals will be updated until maximal potential achieved or discharge criteria met.  Discharge criteria: when goals achieved, discharge from hospital, MD decision/surgical intervention, no progress towards goals, refusal/missing three consecutive treatments without notification or medical reason.  GP    Wyona Almas, PT, DPT Acute Rehabilitation Services Pager [phone] Office [phone]   Charges PT Wound Care Charges $Wound Debridement up to 20 cm: < or equal to 20 cm $ Wound Debridement each add'l 20 sqcm: 6 $PT PLS Gun and Tip: 1 Supply $PT Hydrotherapy Visit: 2 Visits       Carloine Margo Aye 10/29/2020, 10:13 AM

## 2020-10-29 NOTE — Progress Notes (Signed)
Progress Note    Raymond Stone   KGU:[id number]  DOB: 1951/04/21  DOA: 10/12/2020     17  PCP: [name], NP  CC: found down   Hospital Course: [name] admitted to the hospital with working diagnosis hypovolemic shock complicated by acute kidney injury/ATN. 70 year old male past medical history of hypertension who was found down, apparently he was not seen for about 5 days. When EMS arrived he was severely hypotensive, his temperature was 100 F he received intravenous fluids and transported to the emergency room.   Sodium 172, potassium 7.3, chloride>130,bicarb 15, glucose 156, BUN 211, creatinine 11.2, AST 118, ALT 42, total bilirubin is 1.5, troponin I 489-395, lactic acid 5.3. anion gap 49. ABG, pH 7.37, PCO2 26, PO2 96, bicarb 15 White cell count 18.7, hemoglobin 11.1, hematocrit 37.6, platelets 346. SARS COVID-19 negative. Urinalysis specific gravity 1.026, negative protein, 21-50 white cells, 0-5 red cells. Toxicology negative. Head CT negative for acute changes.  Chest radiograph no infiltrates. EKG had 19 bpm, normal axis, normal intervals, sinus rhythm, no ST segment or T wave changes. Patient was diagnosed with shock, he received intravenous fluids and broad-spectrum antibiotic therapy. Patient underwent CRRT04/03with further improvement of his kidney function. Peak CK 2,415 mild rhabdomyolysis likely not the culprit of renal failure. Patient very debilitated.  Patient with persistent fever despite broad spectrum antibiotic therapy, getting CT abdomen and pelvis which showed no acute intra-abdominal pathology.  Due to ongoing poor oral intake, we discussed need for at least temporary tube feeding.  Patient was amenable.  Coretrak was placed on 10/25/2020 and tube feeds initiated.  Interval History:  No events overnight.  He is awake and oriented to his name, year, president.  Today he thought he was in [place] however.  Denied any discomfort  and otherwise resting in bed in his usual debilitated state.  ROS: Constitutional: negative for chills and fevers, Respiratory: negative for cough, Cardiovascular: negative for chest pain and Gastrointestinal: negative for abdominal pain  Assessment & Plan: Chronic metabolic encephalopathy  [place]ic encephalopathy - resolved  Advanced cerebral atrophy Adult failure to thrive -metabolic derangements corrected since admission and he is technically oriented (although some worsening at times) but he is not thriving well at all; does not eat much and is so weak/deconditioned he may not have the physical reserve to survive this illness - for now, attempting TF to strengthen/re-condition as able; if fails, he would be more appropriate for hospice/comfort care and end of life discussions with his daughter; Palliative care greatly assisting - s/p cortrak 4/15 and TF started on 10/25/20 - at this time I believe best plan to be: ~2 more days on enteral TF via cortrak. Then perform a calorie count over 48 hours. If nutrition not adequate for needs then I do not think a PEG is in patient's best interest given his overall poor quality of life (currently bed bound, multiple wounds/pressure ulcers, advanced cerebral atrophy, hx etoh abuse, ongoing encephalopathy, increased risk of aspiration). At which time, discussion should be held with daughter to pursue DNR/DNI and pursuit of comfort care/hospice  Moderatecalorie protein malnutrition Poor oral intake - Continue with nutritional supplements, multivitamins, and thiamine. -Continue dysphagia diet; not taking in much nutrition and at risk for further decline/complications; ongoing [place] with daughter; [facility] assisting discussions - agree with trial of mirtazapine - Cortrak placed on 4/15 and TF initiated; see discussion above  Fever - Afebrile now ~24 hours. No atelectasis or infiltrates on CXR. Abdomen benign as well.  Multiple wounds on body but has been on  several days of abx - differential includes drug fever vs central fever vs early atelectasis (bed bound) vs wounds vs other - BCx from 4/17 NGTD when patient had fever; has an external catheter in place and no PICC or central access - hold off on abx  Hypovolemic shock with AKI/ATN Hyperkalemia/hypokalemia Hypernatremia - 172 on admission AGMA 2/2 renal failure Hypomagnesemia - s/p volume resuscitation on admission followed by CRRT - renal function now normalized  Sepsis 2/2 wounds (not present on admission). Patient with complex wounds, left hemithorax and left thigh - s/p abx course x 5 days - continue ongoing wound care - see failure to thrive  HTN. -Continue Lopressor  Alcohol abuse - continue folic, thiamine, MVI - PRN benzo  Aspiration pneumonitis (present on admission)  Continue with aspiration precautions.  Old records reviewed in assessment of this patient  Antimicrobials: Zosyn 4/3 - 4/4 Rocephin 4/10 >> 4/15 Vanc 4/10 >> 4/15  DVT prophylaxis: enoxaparin (LOVENOX) injection 40 mg Start: 10/17/20 1530 SCDs Start: 10/12/20 2213   Code Status:   Code Status: Full Code Family Communication:   Disposition Plan: Status is: Inpatient  Remains inpatient appropriate because:IV treatments appropriate due to intensity of illness or inability to take PO and Inpatient level of care appropriate due to severity of illness   Dispo: The patient is from: Home              Anticipated d/c is to: SNF              Patient currently is not medically stable to d/c.   Difficult to place patient No  Risk of unplanned readmission score: Unplanned Admission- Pilot do not use: 17.68   Objective: Blood pressure 104/73, pulse (!) 115, temperature 99 F (37.2 C), temperature source Oral, resp. rate 20, height 5\' 9"  (1.753 m), weight 76.2 kg, SpO2 98 %.  Examination: General appearance: Chronically ill-appearing and globally weak/deconditioned man laying in bed Head:  Normocephalic, without obvious abnormality, atraumatic Eyes: EOMI  Nose: cortrak in place Lungs: coarse sounds bilaterally, no rhonchi or wheezing Heart: regular rate and rhythm and S1, S2 normal Abdomen: normal findings: bowel sounds normal and soft, non-tender Extremities: edematous Skin: scattered excoriations and dry skin Neurologic: worsening LE weakness compared to UE but follows commands and moves all 4 extremities (minimal in LE)  Consultants:   PCM  Procedures:     Data Reviewed: I have personally reviewed following labs and imaging studies Results for orders placed or performed during the hospital encounter of 10/12/20 (from the past 24 hour(s))  Glucose, capillary     Status: Abnormal   Collection Time: 10/28/20  4:08 PM  Result Value Ref Range   Glucose-Capillary 126 (H) 70 - 99 mg/dL  Glucose, capillary     Status: Abnormal   Collection Time: 10/28/20  8:38 PM  Result Value Ref Range   Glucose-Capillary 139 (H) 70 - 99 mg/dL  Glucose, capillary     Status: Abnormal   Collection Time: 10/29/20  1:12 AM  Result Value Ref Range   Glucose-Capillary 136 (H) 70 - 99 mg/dL  Basic metabolic panel     Status: Abnormal   Collection Time: 10/29/20  3:12 AM  Result Value Ref Range   Sodium 129 (L) 135 - 145 mmol/L   Potassium 4.6 3.5 - 5.1 mmol/L   Chloride 96 (L) 98 - 111 mmol/L   CO2 24 22 - 32 mmol/L   Glucose,  Bld 134 (H) 70 - 99 mg/dL   BUN 25 (H) 8 - 23 mg/dL   Creatinine, Ser 0.91 0.61 - 1.24 mg/dL   Calcium 10.0 8.9 - 10.3 mg/dL   GFR, Estimated >60 >60 mL/min   Anion gap 9 5 - 15  CBC with Differential/Platelet     Status: Abnormal   Collection Time: 10/29/20  3:12 AM  Result Value Ref Range   WBC 10.9 (H) 4.0 - 10.5 K/uL   RBC 2.47 (L) 4.22 - 5.81 MIL/uL   Hemoglobin 7.9 (L) 13.0 - 17.0 g/dL   HCT 24.1 (L) 39.0 - 52.0 %   MCV 97.6 80.0 - 100.0 fL   MCH 32.0 26.0 - 34.0 pg   MCHC 32.8 30.0 - 36.0 g/dL   RDW 13.8 11.5 - 15.5 %   Platelets 409 (H) 150 -  400 K/uL   nRBC 0.0 0.0 - 0.2 %   Neutrophils Relative % 67 %   Neutro Abs 7.3 1.7 - 7.7 K/uL   Lymphocytes Relative 15 %   Lymphs Abs 1.6 0.7 - 4.0 K/uL   Monocytes Relative 11 %   Monocytes Absolute 1.2 (H) 0.1 - 1.0 K/uL   Eosinophils Relative 5 %   Eosinophils Absolute 0.6 (H) 0.0 - 0.5 K/uL   Basophils Relative 1 %   Basophils Absolute 0.1 0.0 - 0.1 K/uL   Immature Granulocytes 1 %   Abs Immature Granulocytes 0.13 (H) 0.00 - 0.07 K/uL  Magnesium     Status: Abnormal   Collection Time: 10/29/20  3:12 AM  Result Value Ref Range   Magnesium 1.6 (L) 1.7 - 2.4 mg/dL  Phosphorus     Status: None   Collection Time: 10/29/20  3:12 AM  Result Value Ref Range   Phosphorus 3.3 2.5 - 4.6 mg/dL  Glucose, capillary     Status: Abnormal   Collection Time: 10/29/20  6:03 AM  Result Value Ref Range   Glucose-Capillary 134 (H) 70 - 99 mg/dL  Glucose, capillary     Status: Abnormal   Collection Time: 10/29/20  7:49 AM  Result Value Ref Range   Glucose-Capillary 141 (H) 70 - 99 mg/dL  Glucose, capillary     Status: Abnormal   Collection Time: 10/29/20 11:22 AM  Result Value Ref Range   Glucose-Capillary 149 (H) 70 - 99 mg/dL    Recent Results (from the past 240 hour(s))  Culture, blood (routine x 2)     Status: None   Collection Time: 10/20/20  1:34 AM   Specimen: BLOOD  Result Value Ref Range Status   Specimen Description BLOOD LEFT HAND  Final   Special Requests   Final    BOTTLES DRAWN AEROBIC AND ANAEROBIC Blood Culture adequate volume   Culture   Final    NO GROWTH 5 DAYS Performed at [facility], [address], [place], [place] [address]    Report Status 10/25/2020 FINAL  Final  Culture, blood (routine x 2)     Status: None   Collection Time: 10/20/20  1:43 AM   Specimen: BLOOD  Result Value Ref Range Status   Specimen Description BLOOD RIGHT HAND  Final   Special Requests   Final    BOTTLES DRAWN AEROBIC AND ANAEROBIC Blood Culture adequate volume   Culture    Final    NO GROWTH 5 DAYS Performed at [facility], [place] [address], [place], [place] [address]    Report Status 10/25/2020 FINAL  Final  Culture, blood (routine x 2)     Status: None (Preliminary result)   Collection Time: 10/27/20  1:22 PM   Specimen: BLOOD  Result Value Ref Range Status   Specimen Description BLOOD RIGHT ANTECUBITAL  Final   Special Requests   Final    BOTTLES DRAWN AEROBIC ONLY Blood Culture adequate volume   Culture   Final    NO GROWTH 2 DAYS Performed at [facility], [address], [place], [place] [address]    Report Status PENDING  Incomplete  Culture, blood (routine x 2)     Status: None (Preliminary result)   Collection Time: 10/27/20  1:22 PM   Specimen: BLOOD  Result Value Ref Range Status   Specimen Description BLOOD LEFT ANTECUBITAL  Final   Special Requests   Final    BOTTLES DRAWN AEROBIC ONLY Blood Culture adequate volume   Culture   Final    NO GROWTH 2 DAYS Performed at [facility], [place] [address], [place], [place] [address]    Report Status PENDING  Incomplete     Radiology Studies: No results found. DG Abd Portable 1V  Final Result    DG CHEST PORT 1 VIEW  Final Result    CT ABDOMEN PELVIS W CONTRAST  Final Result    DG CHEST PORT 1 VIEW  Final Result    DG CHEST PORT 1 VIEW  Final Result    US RENAL  Final Result    CT Head Wo Contrast  Final Result    DG Chest Port 1 View  Final Result      Scheduled Meds: . collagenase   Topical Daily  . enoxaparin (LOVENOX) injection  40 mg Subcutaneous Q24H  . feeding supplement  237 mL Oral TID WC & HS  . feeding supplement (PROSource TF)  45 mL Per Tube BID  . folic acid  1 mg Per Tube Daily  . magnesium oxide  400 mg Per Tube BID  . mouth rinse  15 mL Mouth Rinse BID  . metoprolol tartrate  50 mg Per Tube BID  . mirtazapine  15 mg Oral QHS  . multivitamin  15 mL Per Tube Daily  . sennosides  5 mL Per Tube QHS  . thiamine  100 mg Per Tube Daily    PRN Meds: sodium chloride, acetaminophen (TYLENOL) oral liquid 160 mg/5 mL, lip balm, LORazepam, ondansetron (ZOFRAN) IV, polyethylene glycol, polyvinyl alcohol Continuous Infusions: . sodium chloride 1,000 mL (10/21/20 1514)  . feeding supplement (OSMOLITE 1.5 CAL) 1,000 mL (10/28/20 1601)     LOS: 17 days  Time spent: Greater than 50% of the 35 minute visit was spent in counseling/coordination of care for the patient as laid out in the A&P.   Dwyane Dee, MD [facility] 10/29/2020, 3:14 PM

## 2020-10-29 NOTE — Care Management Important Message (Signed)
Important Message  Patient Details  Name: Raymond Stone MRN: [id number] Date of Birth: [birth year]   Medicare Important Message Given:  Yes     Shelda Altes 10/29/2020, 12:38 PM

## 2020-10-30 LAB — CBC WITH DIFFERENTIAL/PLATELET
Abs Immature Granulocytes: 0.1 10*3/uL — ABNORMAL HIGH (ref 0.00–0.07)
Basophils Absolute: 0 10*3/uL (ref 0.0–0.1)
Basophils Relative: 0 %
Eosinophils Absolute: 0.5 10*3/uL (ref 0.0–0.5)
Eosinophils Relative: 5 %
HCT: 24 % — ABNORMAL LOW (ref 39.0–52.0)
Hemoglobin: 7.7 g/dL — ABNORMAL LOW (ref 13.0–17.0)
Immature Granulocytes: 1 %
Lymphocytes Relative: 14 %
Lymphs Abs: 1.3 10*3/uL (ref 0.7–4.0)
MCH: 31 pg (ref 26.0–34.0)
MCHC: 32.1 g/dL (ref 30.0–36.0)
MCV: 96.8 fL (ref 80.0–100.0)
Monocytes Absolute: 1.1 10*3/uL — ABNORMAL HIGH (ref 0.1–1.0)
Monocytes Relative: 12 %
Neutro Abs: 6.6 10*3/uL (ref 1.7–7.7)
Neutrophils Relative %: 68 %
Platelets: 379 10*3/uL (ref 150–400)
RBC: 2.48 MIL/uL — ABNORMAL LOW (ref 4.22–5.81)
RDW: 14.1 % (ref 11.5–15.5)
WBC: 9.7 10*3/uL (ref 4.0–10.5)
nRBC: 0 % (ref 0.0–0.2)

## 2020-10-30 LAB — BASIC METABOLIC PANEL
Anion gap: 6 (ref 5–15)
BUN: 26 mg/dL — ABNORMAL HIGH (ref 8–23)
CO2: 27 mmol/L (ref 22–32)
Calcium: 9.9 mg/dL (ref 8.9–10.3)
Chloride: 98 mmol/L (ref 98–111)
Creatinine, Ser: 0.81 mg/dL (ref 0.61–1.24)
GFR, Estimated: >60 mL/min (ref >60)
Glucose, Bld: 123 mg/dL — ABNORMAL HIGH (ref 70–99)
Potassium: 4.6 mmol/L (ref 3.5–5.1)
Sodium: 131 mmol/L — ABNORMAL LOW (ref 135–145)

## 2020-10-30 LAB — GLUCOSE, CAPILLARY
Glucose-Capillary: 116 mg/dL — ABNORMAL HIGH (ref 70–99)
Glucose-Capillary: 122 mg/dL — ABNORMAL HIGH (ref 70–99)
Glucose-Capillary: 122 mg/dL — ABNORMAL HIGH (ref 70–99)
Glucose-Capillary: 131 mg/dL — ABNORMAL HIGH (ref 70–99)
Glucose-Capillary: 133 mg/dL — ABNORMAL HIGH (ref 70–99)
Glucose-Capillary: 141 mg/dL — ABNORMAL HIGH (ref 70–99)

## 2020-10-30 LAB — MAGNESIUM: Magnesium: 1.5 mg/dL — ABNORMAL LOW (ref 1.7–2.4)

## 2020-10-30 LAB — PHOSPHORUS: Phosphorus: 4.1 mg/dL (ref 2.5–4.6)

## 2020-10-30 LAB — HEMOGLOBIN AND HEMATOCRIT, BLOOD
HCT: 23.2 % — ABNORMAL LOW (ref 39.0–52.0)
Hemoglobin: 7.7 g/dL — ABNORMAL LOW (ref 13.0–17.0)

## 2020-10-30 MED ORDER — MAGNESIUM SULFATE 2 GM/50ML IV SOLN
2.0000 g | Freq: Once | INTRAVENOUS | Status: AC
  Administered 2020-10-30: 2 g via INTRAVENOUS
  Filled 2020-10-30: qty 50

## 2020-10-30 MED ORDER — FREE WATER
100.0000 mL | Freq: Four times a day (QID) | Status: DC
  Administered 2020-10-31 – 2020-11-02 (×6): 100 mL

## 2020-10-30 NOTE — Progress Notes (Signed)
Nutrition Follow-up  DOCUMENTATION CODES:   Non-severe (moderate) malnutrition in context of chronic illness  INTERVENTION:  Continue Osmolite 1.5 cal formula via Cortrak NGT at goal rate of 65 ml/hr.  Continue 45 ml Prosource TF BID per tube.   Tube feeding regimen provides 2420 kcal, 120 grams of protein, and 1186 ml free water.   Provide Ensure Enlive po TID, each supplement provides 350 kcal and 20 grams of protein.  Provide Magic cup TID with meals, each supplement provides 290 kcal and 9 grams of protein.  NUTRITION DIAGNOSIS:   Moderate Malnutrition related to chronic illness (CHF) as evidenced by mild fat depletion,moderate muscle depletion; ongoing  GOAL:   Patient will meet greater than or equal to 90% of their needs; met with TF  MONITOR:   PO intake,Supplement acceptance,Weight trends,Labs,I & O's,Diet advancement,Skin  REASON FOR ASSESSMENT:   Consult Enteral/tube feeding initiation and management  ASSESSMENT:   Patient with PMH significant for HTN, peripheral edema, CHF, and alcohol use disorder. Presents this admission with suspected uremic encephalopathy and AKI.   4/3- start CRRT 4/4- stop CRRT due to pressure alarming 4/6 - HD Cath removed   Pt currently on a dysphagia 2 diet with thin liquids. Meal completion has been very poor at 0-10%. Pt currently somnolent and minimally responsive. Cortrak remains in place for tube feeding. Pt has been tolerating his TF well. Plans to continue with current TF orders to provide adequate nutrition as po intake remains very poor.   Labs and medications reviewed.   Diet Order:   Diet Order            DIET DYS 2 Room service appropriate? Yes; Fluid consistency: Thin  Diet effective now                 EDUCATION NEEDS:   Not appropriate for education at this time  Skin:  Skin Assessment: Reviewed RN Assessment Skin Integrity Issues:: Unstageable,DTI DTI: L elbow, R toe, bilateral ankles, bilateral  knees Stage I: N/A Stage II: N/A Unstageable: L thigh, L flank Other: skin tear scrotum  Last BM:  4/20  Height:   Ht Readings from Last 1 Encounters:  10/13/20 5' 9" (1.753 m)    Weight:   Wt Readings from Last 1 Encounters:  10/30/20 74.4 kg    BMI:  Body mass index is 24.22 kg/m.  Estimated Nutritional Needs:   Kcal:  2300-2500 kcal  Protein:  115-130 grams  Fluid:  >/= 2 L/day  Corrin Parker, MS, RD, LDN RD pager number/after hours weekend pager number on Amion.

## 2020-10-30 NOTE — Progress Notes (Signed)
Physical Therapy Wound Treatment Patient Details  Name: Raymond Stone MRN: [id number] Date of Birth: Jun 27, 1951  Today's Date: 10/30/2020 Time: 1017-1109 Time Calculation (min): 52 min  Subjective  Subjective Assessment Patient and Family Stated Goals: None stated Prior Treatments: Dressing changes prior to hydro being consulted.  Pain Score:  6/10 with repositioning, not directly from the hydrotherapy  Wound Assessment  Pressure Injury 10/13/20 Thigh Anterior;Left;Proximal Unstageable - Full thickness tissue loss in which the base of the injury is covered by slough (yellow, tan, gray, green or brown) and/or eschar (tan, brown or black) in the wound bed. (Active)  Wound Image   10/28/20 1253  Dressing Type ABD;Barrier Film (skin prep);Gauze (Comment);Normal saline moist dressing;Santyl 10/30/20 1336  Dressing Changed;Clean;Dry;Intact 10/30/20 1336  Dressing Change Frequency Daily 10/30/20 1336  State of Healing Eschar 10/30/20 1336  Site / Wound Assessment Yellow;Red;Pink;Other (Comment) 10/30/20 1336  % Wound base Red or Granulating 5% 10/30/20 1336  % Wound base Yellow/Fibrinous Exudate 85% 10/30/20 1336  % Wound base Black/Eschar 10% 10/30/20 1336  % Wound base Other/Granulation Tissue (Comment) 0% 10/29/20 1008  Peri-wound Assessment Intact;Erythema (blanchable) 10/30/20 1336  Wound Length (cm) 12.5 cm 10/28/20 1253  Wound Width (cm) 7 cm 10/28/20 1253  Wound Depth (cm) 0.1 cm 10/28/20 1253  Wound Surface Area (cm^2) 87.5 cm^2 10/28/20 1253  Wound Volume (cm^3) 8.75 cm^3 10/28/20 1253  Tunneling (cm) 0 10/15/20 1349  Undermining (cm) 0 10/15/20 1349  Margins Unattached edges (unapproximated) 10/30/20 1336  Drainage Amount Minimal 10/30/20 1336  Drainage Description Serosanguineous;Sanguineous 10/30/20 1336  Treatment Cleansed;Debridement (Selective);Hydrotherapy (Pulse lavage);Packing (Saline gauze) 10/30/20 1336     Pressure Injury 10/13/20 Flank Left;Upper Unstageable -  Full thickness tissue loss in which the base of the injury is covered by slough (yellow, tan, gray, green or brown) and/or eschar (tan, brown or black) in the wound bed. (Active)  Wound Image   10/28/20 1253  Dressing Type Barrier Film (skin prep);Foam - Lift dressing to assess site every shift;Moist to dry;Santyl 10/30/20 1336  Dressing Changed;Clean;Dry;Intact 10/30/20 1336  Dressing Change Frequency Daily 10/30/20 1336  State of Healing Eschar 10/30/20 1336  Site / Wound Assessment Yellow;Pink;Black 10/29/20 1008  % Wound base Red or Granulating 25% 10/30/20 1336  % Wound base Yellow/Fibrinous Exudate 50% 10/30/20 1336  % Wound base Black/Eschar 10% 10/30/20 1336  % Wound base Other/Granulation Tissue (Comment) 15% 10/30/20 1336  Peri-wound Assessment Erythema (blanchable) 10/30/20 1336  Wound Length (cm) 5 cm 10/28/20 1253  Wound Width (cm) 8 cm 10/28/20 1253  Wound Depth (cm) 0.1 cm 10/28/20 1253  Wound Surface Area (cm^2) 40 cm^2 10/28/20 1253  Wound Volume (cm^3) 4 cm^3 10/28/20 1253  Margins Unattached edges (unapproximated) 10/30/20 1336  Drainage Amount Minimal 10/30/20 1336  Drainage Description Serosanguineous 10/30/20 1336  Treatment Cleansed;Debridement (Selective);Hydrotherapy (Pulse lavage);Tape changed 10/30/20 1336     Pressure Injury 10/13/20 Flank Left;Mid Unstageable - Full thickness tissue loss in which the base of the injury is covered by slough (yellow, tan, gray, green or brown) and/or eschar (tan, brown or black) in the wound bed. (Active)  Wound Image   10/28/20 1253  Dressing Type Barrier Film (skin prep);Foam - Lift dressing to assess site every shift;Moist to dry 10/30/20 1336  Dressing Changed;Clean;Dry;Intact 10/30/20 1336  Dressing Change Frequency Daily 10/30/20 1336  State of Healing Early/partial granulation 10/30/20 1336  Site / Wound Assessment Pink;Red;Yellow 10/30/20 1336  % Wound base Red or Granulating 90% 10/30/20 1336  % Wound base  Yellow/Fibrinous  Exudate 10% 10/30/20 1336  % Wound base Black/Eschar 0% 10/30/20 1336  % Wound base Other/Granulation Tissue (Comment) 0% 10/30/20 1336  Peri-wound Assessment Excoriated 10/29/20 1008  Wound Length (cm) 6 cm 10/28/20 1253  Wound Width (cm) 2.8 cm 10/28/20 1253  Wound Depth (cm) 0.2 cm 10/28/20 1253  Wound Surface Area (cm^2) 16.8 cm^2 10/28/20 1253  Wound Volume (cm^3) 3.36 cm^3 10/28/20 1253  Tunneling (cm) 0 10/15/20 1349  Undermining (cm) 0 10/15/20 1349  Margins Unattached edges (unapproximated) 10/30/20 1336  Drainage Amount Minimal 10/30/20 1336  Drainage Description Serous 10/30/20 1336  Treatment Cleansed;Debridement (Selective);Hydrotherapy (Pulse lavage);Packing (Saline gauze) 10/30/20 1336      Hydrotherapy Pulsed lavage therapy - wound location: L flank (upper and lower), L thigh Pulsed Lavage with Suction (psi): 12 psi Pulsed Lavage with Suction - Normal Saline Used: 1000 mL Pulsed Lavage Tip: Tip with splash shield Selective Debridement Selective Debridement - Location: L flank (upper and lower), L thigh Selective Debridement - Tools Used: Scalpel,Forceps Selective Debridement - Tissue Removed: eschar, yellow slough, necrosing adipose    Wound Assessment and Plan  Wound Therapy - Assess/Plan/Recommendations Wound Therapy - Clinical Statement: Left superior ribcage and thigh wound are progressing with hydrotherapy. .  Continuing pulsatile lavage of left inferior ribcage wound. Wound Therapy - Functional Problem List: Global weakness in the setting of undetermined time down at home. Factors Delaying/Impairing Wound Healing: Infection - systemic/local,Immobility Hydrotherapy Plan: Debridement,Dressing change,Patient/family education,Pulsatile lavage with suction Wound Therapy - Frequency: 6X / week Wound Therapy - Current Recommendations: PT Wound Therapy - Follow Up Recommendations: dressing changes by RN  Wound Therapy Goals- Improve the function  of patient's integumentary system by progressing the wound(s) through the phases of wound healing (inflammation - proliferation - remodeling) by: Wound Therapy Goals - Improve the function of patient's integumentary system by progressing the wound(s) through the phases of wound healing by: Decrease Necrotic Tissue to: 20% Decrease Necrotic Tissue - Progress: Progressing toward goal Increase Granulation Tissue to: 80% Increase Granulation Tissue - Progress: Progressing toward goal Goals/treatment plan/discharge plan were made with and agreed upon by patient/family: No, Patient unable to participate in goals/treatment/discharge plan and family unavailable Time For Goal Achievement: 7 days Wound Therapy - Potential for Goals: Fair  Goals will be updated until maximal potential achieved or discharge criteria met.  Discharge criteria: when goals achieved, discharge from hospital, MD decision/surgical intervention, no progress towards goals, refusal/missing three consecutive treatments without notification or medical reason.  GP     Charges PT Wound Care Charges $Wound Debridement up to 20 cm: < or equal to 20 cm $ Wound Debridement each add'l 20 sqcm: 6 $PT PLS Gun and Tip: 1 Supply $PT Hydrotherapy Visit: 2 Visits     10/30/2020  Ginger Carne., PT Acute Rehabilitation Services [phone]  (pager) [phone]  (office)  Tessie Fass Blanka Rockholt 10/30/2020, 1:45 PM

## 2020-10-30 NOTE — Progress Notes (Signed)
PROGRESS NOTE  Raymond Stone LTJ:[id number] DOB: 1950/09/17 DOA: 10/12/2020 PCP: [name], NP  HPI/Recap of past 24 hours: Raymond Stone admitted to the hospital with working diagnosis hypovolemic shock complicated by acute kidney injury/ATN. 70 year old male past medical history of hypertension who was found down, apparently he was not seen for about 5 days. When EMS arrived he was severely hypotensive, his temperature was 100 F he received intravenous fluids and transported to the emergency room.   Sodium 172, potassium 7.3, chloride>130,bicarb 15, glucose 156, BUN 211, creatinine 11.2, AST 118, ALT 42, total bilirubin is 1.5, troponin I 489-395, lactic acid 5.3. anion gap 49. ABG, pH 7.37, PCO2 26, PO2 96, bicarb 15 White cell count 18.7, hemoglobin 11.1, hematocrit 37.6, platelets 346. SARS COVID-19 negative. Urinalysis specific gravity 1.026, negative protein, 21-50 white cells, 0-5 red cells. Toxicology negative. Head CT negative for acute changes.  Chest radiograph no infiltrates. EKG had 19 bpm, normal axis, normal intervals, sinus rhythm, no ST segment or T wave changes. Patient was diagnosed with shock, he received intravenous fluids and broad-spectrum antibiotic therapy. Patient underwent CRRT04/03with further improvement of his kidney function. Peak CK 2,415 mild rhabdomyolysis likely not the culprit of renal failure. Patient very debilitated.  Patient with persistent fever despite broad spectrum antibiotic therapy, getting CT abdomen and pelvis which showed no acute intra-abdominal pathology.  Due to ongoing poor oral intake, we discussed need for at least temporary tube feeding.  Patient was amenable.  Coretrak was placed on 10/25/2020 and tube feeds initiated.  10/30/20: Patient was seen and examined at his bedside.  He is somnolent and minimally responsive.  Core track in place.  Assessment/Plan: Active Problems:   Essential hypertension   AKI  (acute kidney injury) ([place])   Alcohol dependence (HCC)   Pressure injury of skin   Malnutrition of moderate degree   Hypernatremia   Hyperkalemia   Acute metabolic encephalopathy   ATN (acute tubular necrosis) (HCC)   Cerebral atrophy (HCC)   Failure to thrive in adult   Fever  Chronic metabolic encephalopathy  [place] encephalopathy - resolved  Advanced cerebral atrophy Adult failure to thrive -metabolic derangements corrected since admission and he is technically oriented (although some worsening at times) but he is not thriving well at all; does not eat much and is so weak/deconditioned he may not have the physical reserve to survive this illness - for now, attempting TF to strengthen/re-condition as able; if fails, he would be more appropriate for hospice/comfort care and end of life discussions with his daughter; Palliative care greatly assisting - s/p cortrak 4/15 and TF started on 10/25/20 - at this time I believe best plan to be: ~2 more days on enteral TF via cortrak. Then perform a calorie count over 48 hours. If nutrition not adequate for needs then I do not think a PEG is in patient's best interest given his overall poor quality of life (currently bed bound, multiple wounds/pressure ulcers, advanced cerebral atrophy, hx etoh abuse, ongoing encephalopathy, increased risk of aspiration). At which time, discussion should be held with daughter to pursue DNR/DNI and pursuit of comfort care/hospice  Moderatecalorie protein malnutrition Poor oral intake - Continue with nutritional supplements, multivitamins,and thiamine. -Continue dysphagia diet; not taking in much nutrition and at risk for further decline/complications; ongoing [place] with daughter; [facility] assisting discussions - agree with trial of mirtazapine - Cortrak placed on 4/15 and TF initiated; see discussion above  Tachycardia We will get TSH in the morning. Continue free fluid  via core track to avoid  dehydration  Hypomagnesemia Serum magnesium 1.5 Repleted intravenously.  Resolved fever Afebrile in the last 24 hours. Blood cultures drawn on 10/27/2020 negative to date, continue to monitor cultures until finalized. Monitor fever curve and WBC.  Hypovolemic shock with AKI/ATN Hypernatremia - 172 on admission AGMA 2/2 renal failure - s/p volume resuscitation on admission followed by CRRT - renal function now normalized  Sepsis 2/2 wounds (not present on admission). Patient with complex wounds, left hemithorax and left thigh - s/p abx course x 5 days -Continue local wound care - see failure to thrive  HTN. -Continue Lopressor  Alcohol abuse - continue folic, thiamine, MVI - PRN benzo  Aspiration pneumonitis (present on admission)  Continue with aspiration precautions.  Goals of care Patient has a poor prognosis Bedbound, multiple decubitus ulcers, dysphagia requiring culture, minimally interactive, failure to thrive in an adult. Palliative care following, appreciate assistance. Currently the patient is full code.   Antimicrobials: Zosyn 4/3 - 4/4 Rocephin 4/10 >> 4/15 Vanc 4/10 >> 4/15  DVT prophylaxis: enoxaparin (LOVENOX) injection 40 mg Start: 10/17/20 1530 SCDs Start: 10/12/20 2213   Code Status:   Code Status: Full Code Family Communication:   Disposition Plan: Status is: Inpatient  Remains inpatient appropriate because:IV treatments appropriate due to intensity of illness or inability to take PO and Inpatient level of care appropriate due to severity of illness   Dispo: The patient is from: Home  Anticipated d/c is to: SNF  Patient currently is not medically stable to d/c.              Difficult to place patient No      Objective: Vitals:   10/30/20 0012 10/30/20 0413 10/30/20 0744 10/30/20 1116  BP: (!) 116/55 114/63 108/72 113/61  Pulse: (!) 109 (!) 115 (!) 118 96  Resp: 19  18 18   Temp: (!)  100.7 F (38.2 C) (!) 101.1 F (38.4 C) 99.3 F (37.4 C) 98.9 F (37.2 C)  TempSrc: Oral Axillary Oral Oral  SpO2: 100% 100% 100% 98%  Weight:  74.4 kg    Height:        Intake/Output Summary (Last 24 hours) at 10/30/2020 1449 Last data filed at 10/30/2020 0450 Gross per 24 hour  Intake 2789 ml  Output 1450 ml  Net 1339 ml   Filed Weights   10/28/20 0433 10/29/20 0605 10/30/20 0413  Weight: 73.9 kg 76.2 kg 74.4 kg    Exam:  . General: 70 y.o. year-old male well developed well nourished in no acute distress.  Somnolent and minimally interactive. . Cardiovascular: Tachycardic with no rubs or gallops.  No thyromegaly or JVD noted.   Marland Kitchen Respiratory: Clear to auscultation with no wheezes or rales.  Poor inspiratory effort. . Abdomen: Soft nontender nondistended with normal bowel sounds x4 quadrants. . Musculoskeletal: Dependent edema in upper and lower extremities bilaterally. . Skin: Multiple decubitus ulcers, diffusely. Marland Kitchen Psychiatry: Unable to assess mood due to somnolence.   Data Reviewed: CBC: Recent Labs  Lab 10/26/20 0430 10/27/20 0239 10/28/20 0540 10/29/20 0312 10/30/20 0304  WBC 10.6* 12.3* 10.8* 10.9* 9.7  NEUTROABS 8.1* 9.2* 7.9* 7.3 6.6  HGB 9.0* 8.5* 8.9* 7.9* 7.7*  HCT 26.8* 26.0* 27.3* 24.1* 24.0*  MCV 98.2 99.2 100.0 97.6 96.8  PLT 573* 523* 418* 409* 884   Basic Metabolic Panel: Recent Labs  Lab 10/26/20 0430 10/26/20 1814 10/27/20 0239 10/28/20 0540 10/29/20 0312 10/30/20 0304  NA 135  --  134* 131*  129* 131*  K 4.1  --  4.2 4.5 4.6 4.6  CL 104  --  104 100 96* 98  CO2 23  --  23 25 24 27   GLUCOSE 125*  --  134* 131* 134* 123*  BUN 16  --  17 19 25* 26*  CREATININE 0.63  --  0.60* 0.66 0.91 0.81  CALCIUM 10.1  --  9.8 10.1 10.0 9.9  MG 1.6* 1.5* 1.4* 1.5* 1.6* 1.5*  PHOS 3.9 3.3 2.9  --  3.3 4.1   GFR: Estimated Creatinine Clearance: 86.1 mL/min (by C-G formula based on SCr of 0.81 mg/dL). Liver Function Tests: No results for  input(s): AST, ALT, ALKPHOS, BILITOT, PROT, ALBUMIN in the last 168 hours. No results for input(s): LIPASE, AMYLASE in the last 168 hours. No results for input(s): AMMONIA in the last 168 hours. Coagulation Profile: No results for input(s): INR, PROTIME in the last 168 hours. Cardiac Enzymes: No results for input(s): CKTOTAL, CKMB, CKMBINDEX, TROPONINI in the last 168 hours. BNP (last 3 results) No results for input(s): PROBNP in the last 8760 hours. HbA1C: No results for input(s): HGBA1C in the last 72 hours. CBG: Recent Labs  Lab 10/29/20 1628 10/29/20 2041 10/30/20 0020 10/30/20 0740 10/30/20 1114  GLUCAP 148* 144* 141* 131* 122*   Lipid Profile: No results for input(s): CHOL, HDL, LDLCALC, TRIG, CHOLHDL, LDLDIRECT in the last 72 hours. Thyroid Function Tests: No results for input(s): TSH, T4TOTAL, FREET4, T3FREE, THYROIDAB in the last 72 hours. Anemia Panel: No results for input(s): VITAMINB12, FOLATE, FERRITIN, TIBC, IRON, RETICCTPCT in the last 72 hours. Urine analysis:    Component Value Date/Time   COLORURINE YELLOW 10/20/2020 0815   APPEARANCEUR HAZY (A) 10/20/2020 0815   LABSPEC 1.020 10/20/2020 0815   PHURINE 5.0 10/20/2020 0815   GLUCOSEU >=500 (A) 10/20/2020 0815   HGBUR SMALL (A) 10/20/2020 0815   BILIRUBINUR NEGATIVE 10/20/2020 0815   KETONESUR 5 (A) 10/20/2020 0815   PROTEINUR 30 (A) 10/20/2020 0815   NITRITE NEGATIVE 10/20/2020 0815   LEUKOCYTESUR NEGATIVE 10/20/2020 0815   Sepsis Labs: @LABRCNTIP (procalcitonin:4,lacticidven:4)  ) Recent Results (from the past 240 hour(s))  Culture, blood (routine x 2)     Status: None (Preliminary result)   Collection Time: 10/27/20  1:22 PM   Specimen: BLOOD  Result Value Ref Range Status   Specimen Description BLOOD RIGHT ANTECUBITAL  Final   Special Requests   Final    BOTTLES DRAWN AEROBIC ONLY Blood Culture adequate volume   Culture   Final    NO GROWTH 3 DAYS Performed at [facility], [place]  [address] [place], [place] [address]    Report Status PENDING  Incomplete  Culture, blood (routine x 2)     Status: None (Preliminary result)   Collection Time: 10/27/20  1:22 PM   Specimen: BLOOD  Result Value Ref Range Status   Specimen Description BLOOD LEFT ANTECUBITAL  Final   Special Requests   Final    BOTTLES DRAWN AEROBIC ONLY Blood Culture adequate volume   Culture   Final    NO GROWTH 3 DAYS Performed at [facility], [place] [address] [place], [place] [address]    Report Status PENDING  Incomplete      Studies: No results found.  Scheduled Meds: . collagenase   Topical Daily  . enoxaparin (LOVENOX) injection  40 mg Subcutaneous Q24H  . feeding supplement  237 mL Oral TID WC & HS  . feeding supplement (PROSource  TF)  45 mL Per Tube BID  . folic acid  1 mg Per Tube Daily  . magnesium oxide  400 mg Per Tube BID  . mouth rinse  15 mL Mouth Rinse BID  . metoprolol tartrate  50 mg Per Tube BID  . mirtazapine  15 mg Oral QHS  . multivitamin  15 mL Per Tube Daily  . sennosides  5 mL Per Tube QHS  . thiamine  100 mg Per Tube Daily    Continuous Infusions: . sodium chloride 1,000 mL (10/21/20 1514)  . feeding supplement (OSMOLITE 1.5 CAL) 1,000 mL (10/28/20 1601)     LOS: 18 days     Kayleen Memos, MD [facility] Pager [phone]  If 7PM-7AM, please contact night-coverage [email or web address] Password [facility] 10/30/2020, 2:49 PM

## 2020-10-31 LAB — CBC WITH DIFFERENTIAL/PLATELET
Abs Immature Granulocytes: 0.08 10*3/uL — ABNORMAL HIGH (ref 0.00–0.07)
Basophils Absolute: 0 10*3/uL (ref 0.0–0.1)
Basophils Relative: 0 %
Eosinophils Absolute: 0.5 10*3/uL (ref 0.0–0.5)
Eosinophils Relative: 6 %
HCT: 23.7 % — ABNORMAL LOW (ref 39.0–52.0)
Hemoglobin: 7.9 g/dL — ABNORMAL LOW (ref 13.0–17.0)
Immature Granulocytes: 1 %
Lymphocytes Relative: 14 %
Lymphs Abs: 1.2 10*3/uL (ref 0.7–4.0)
MCH: 31.7 pg (ref 26.0–34.0)
MCHC: 33.3 g/dL (ref 30.0–36.0)
MCV: 95.2 fL (ref 80.0–100.0)
Monocytes Absolute: 0.9 10*3/uL (ref 0.1–1.0)
Monocytes Relative: 10 %
Neutro Abs: 5.9 10*3/uL (ref 1.7–7.7)
Neutrophils Relative %: 69 %
Platelets: 356 10*3/uL (ref 150–400)
RBC: 2.49 MIL/uL — ABNORMAL LOW (ref 4.22–5.81)
RDW: 14.4 % (ref 11.5–15.5)
WBC: 8.7 10*3/uL (ref 4.0–10.5)
nRBC: 0 % (ref 0.0–0.2)

## 2020-10-31 LAB — MAGNESIUM: Magnesium: 1.8 mg/dL (ref 1.7–2.4)

## 2020-10-31 LAB — PHOSPHORUS: Phosphorus: 4.1 mg/dL (ref 2.5–4.6)

## 2020-10-31 LAB — BASIC METABOLIC PANEL
Anion gap: 7 (ref 5–15)
BUN: 27 mg/dL — ABNORMAL HIGH (ref 8–23)
CO2: 26 mmol/L (ref 22–32)
Calcium: 9.7 mg/dL (ref 8.9–10.3)
Chloride: 97 mmol/L — ABNORMAL LOW (ref 98–111)
Creatinine, Ser: 0.8 mg/dL (ref 0.61–1.24)
GFR, Estimated: >60 mL/min (ref >60)
Glucose, Bld: 121 mg/dL — ABNORMAL HIGH (ref 70–99)
Potassium: 4.7 mmol/L (ref 3.5–5.1)
Sodium: 130 mmol/L — ABNORMAL LOW (ref 135–145)

## 2020-10-31 LAB — GLUCOSE, CAPILLARY
Glucose-Capillary: 138 mg/dL — ABNORMAL HIGH (ref 70–99)
Glucose-Capillary: 123 mg/dL — ABNORMAL HIGH (ref 70–99)
Glucose-Capillary: 126 mg/dL — ABNORMAL HIGH (ref 70–99)
Glucose-Capillary: 118 mg/dL — ABNORMAL HIGH (ref 70–99)

## 2020-10-31 LAB — TSH: TSH: 7.661 u[IU]/mL — ABNORMAL HIGH (ref 0.350–4.500)

## 2020-10-31 NOTE — Progress Notes (Signed)
Physical Therapy Wound Treatment Patient Details  Name: Raymond Stone MRN: [id number] Date of Birth: Aug 20, 1950  Today's Date: 10/31/2020 Time: 1015-1058 Time Calculation (min): 43 min  Subjective  Subjective Assessment Patient and Family Stated Goals: None stated Prior Treatments: Dressing changes prior to hydro being consulted.  Pain Score:    Wound Assessment  Pressure Injury 10/13/20 Thigh Anterior;Left;Proximal Unstageable - Full thickness tissue loss in which the base of the injury is covered by slough (yellow, tan, gray, green or brown) and/or eschar (tan, brown or black) in the wound bed. (Active)  Wound Image   10/28/20 1253  Dressing Type ABD;Barrier Film (skin prep);Gauze (Comment);Normal saline moist dressing;Santyl 10/31/20 1341  Dressing Changed;Clean;Dry;Intact 10/31/20 1341  Dressing Change Frequency Daily 10/31/20 1341  State of Healing Eschar 10/30/20 1336  Site / Wound Assessment Brown;Red;Pink;Yellow 10/31/20 1341  % Wound base Red or Granulating 5% 10/31/20 1341  % Wound base Yellow/Fibrinous Exudate 90% 10/31/20 1341  % Wound base Black/Eschar 5% 10/31/20 1341  % Wound base Other/Granulation Tissue (Comment) 0% 10/29/20 1008  Peri-wound Assessment Intact 10/31/20 1341  Wound Length (cm) 12.5 cm 10/30/20 1000  Wound Width (cm) 10 cm 10/30/20 1000  Wound Depth (cm) 0.1 cm 10/30/20 1000  Wound Surface Area (cm^2) 125 cm^2 10/30/20 1000  Wound Volume (cm^3) 12.5 cm^3 10/30/20 1000  Tunneling (cm) 0 10/15/20 1349  Undermining (cm) 0 10/15/20 1349  Margins Unattached edges (unapproximated) 10/31/20 1341  Drainage Amount Minimal 10/31/20 1341  Drainage Description Serous 10/31/20 1341  Treatment Cleansed;Debridement (Selective);Hydrotherapy (Pulse lavage);Packing (Saline gauze) 10/31/20 1341     Pressure Injury 10/13/20 Flank Left;Upper Unstageable - Full thickness tissue loss in which the base of the injury is covered by slough (yellow, tan, gray, green or brown)  and/or eschar (tan, brown or black) in the wound bed. (Active)  Wound Image   10/28/20 1253  Dressing Type Barrier Film (skin prep);Foam - Lift dressing to assess site every shift;Moist to dry 10/31/20 1341  Dressing Changed;Clean;Dry;Intact 10/31/20 1341  Dressing Change Frequency Daily 10/31/20 1341  State of Healing Early/partial granulation 10/31/20 1341  Site / Wound Assessment Clean;Red;Pink;Yellow 10/31/20 1341  % Wound base Red or Granulating 25% 10/31/20 1341  % Wound base Yellow/Fibrinous Exudate 60% 10/31/20 1341  % Wound base Black/Eschar 0% 10/31/20 1341  % Wound base Other/Granulation Tissue (Comment) 15% 10/31/20 1341  Peri-wound Assessment Intact 10/31/20 1341  Wound Length (cm) 4.8 cm 10/30/20 1000  Wound Width (cm) 8 cm 10/30/20 1000  Wound Depth (cm) 0.1 cm 10/30/20 1000  Wound Surface Area (cm^2) 38.4 cm^2 10/30/20 1000  Wound Volume (cm^3) 3.84 cm^3 10/30/20 1000  Margins Unattached edges (unapproximated) 10/30/20 1336  Drainage Amount Minimal 10/31/20 1341  Drainage Description Serous 10/31/20 1341  Treatment Cleansed;Debridement (Selective);Hydrotherapy (Pulse lavage);Packing (Saline gauze) 10/31/20 1341     Pressure Injury 10/13/20 Flank Left;Mid Unstageable - Full thickness tissue loss in which the base of the injury is covered by slough (yellow, tan, gray, green or brown) and/or eschar (tan, brown or black) in the wound bed. (Active)  Wound Image   10/28/20 1253  Dressing Type Barrier Film (skin prep);Foam - Lift dressing to assess site every shift;Normal saline moist dressing 10/31/20 1341  Dressing Clean;Dry;Intact 10/31/20 1341  Dressing Change Frequency Daily 10/31/20 1341  State of Healing Early/partial granulation 10/31/20 1341  Site / Wound Assessment Pink;Red 10/31/20 1341  % Wound base Red or Granulating 95% 10/31/20 1341  % Wound base Yellow/Fibrinous Exudate 5% 10/31/20 1341  % Wound base  Black/Eschar 0% 10/31/20 1341  % Wound base  Other/Granulation Tissue (Comment) 0% 10/31/20 1341  Peri-wound Assessment Intact 10/31/20 1341  Wound Length (cm) 5.5 cm 10/30/20 1000  Wound Width (cm) 3 cm 10/30/20 1000  Wound Depth (cm) 0.1 cm 10/30/20 1000  Wound Surface Area (cm^2) 16.5 cm^2 10/30/20 1000  Wound Volume (cm^3) 1.65 cm^3 10/30/20 1000  Tunneling (cm) 0 10/15/20 1349  Undermining (cm) 0 10/15/20 1349  Margins Unattached edges (unapproximated) 10/31/20 1341  Drainage Amount Minimal 10/31/20 1341  Drainage Description Serous 10/31/20 1341  Treatment Hydrotherapy (Pulse lavage);Cleansed;Debridement (Selective);Packing (Saline gauze) 10/31/20 1341   Santyl applied to wound bed prior to applying dressing.    Hydrotherapy Pulsed lavage therapy - wound location: L flank (upper and lower), L thigh Pulsed Lavage with Suction (psi): 12 psi Pulsed Lavage with Suction - Normal Saline Used: 1000 mL Pulsed Lavage Tip: Tip with splash shield Selective Debridement Selective Debridement - Location: L flank (upper and lower), L thigh Selective Debridement - Tools Used: Scalpel,Forceps Selective Debridement - Tissue Removed: eschar, yellow slough, necrosing adipose    Wound Assessment and Plan  Wound Therapy - Assess/Plan/Recommendations Wound Therapy - Clinical Statement: PLS appropriate in decreasing bio burden and softening tissues for selective debridement Wound Therapy - Functional Problem List: Global weakness in the setting of undetermined time down at home. Factors Delaying/Impairing Wound Healing: Infection - systemic/local,Immobility Hydrotherapy Plan: Debridement,Dressing change,Patient/family education,Pulsatile lavage with suction Wound Therapy - Frequency: 6X / week Wound Therapy - Current Recommendations: PT Wound Therapy - Follow Up Recommendations: dressing changes by RN  Wound Therapy Goals- Improve the function of patient's integumentary system by progressing the wound(s) through the phases of wound healing  (inflammation - proliferation - remodeling) by: Wound Therapy Goals - Improve the function of patient's integumentary system by progressing the wound(s) through the phases of wound healing by: Decrease Necrotic Tissue to: 20% Decrease Necrotic Tissue - Progress: Progressing toward goal Increase Granulation Tissue to: 80% Increase Granulation Tissue - Progress: Progressing toward goal Goals/treatment plan/discharge plan were made with and agreed upon by patient/family: No, Patient unable to participate in goals/treatment/discharge plan and family unavailable Time For Goal Achievement: 7 days Wound Therapy - Potential for Goals: Fair  Goals will be updated until maximal potential achieved or discharge criteria met.  Discharge criteria: when goals achieved, discharge from hospital, MD decision/surgical intervention, no progress towards goals, refusal/missing three consecutive treatments without notification or medical reason.  GP     Charges PT Wound Care Charges $Wound Debridement up to 20 cm: < or equal to 20 cm $ Wound Debridement each add'l 20 sqcm: 6 $PT PLS Gun and Tip: 1 Supply $PT Hydrotherapy Visit: 2 Visits 10/31/2020  Ginger Carne., PT Acute Rehabilitation Services [phone]  (pager) [phone]  (office)      Tessie Fass Gara Kincade 10/31/2020, 2:02 PM

## 2020-10-31 NOTE — Progress Notes (Signed)
PROGRESS NOTE  Raymond Stone WKM:[id number] DOB: 13-Aug-1950 DOA: 10/12/2020 PCP: [name], NP  HPI/Recap of past 24 hours: Raymond Stone admitted to the hospital with working diagnosis hypovolemic shock complicated by acute kidney injury/ATN. 70 year old male past medical history of hypertension who was found down, apparently he was not seen for about 5 days. When EMS arrived he was severely hypotensive, his temperature was 100 F he received intravenous fluids and transported to the emergency room.   Sodium 172, potassium 7.3, chloride>130,bicarb 15, glucose 156, BUN 211, creatinine 11.2, AST 118, ALT 42, total bilirubin is 1.5, troponin I 489-395, lactic acid 5.3. anion gap 49. ABG, pH 7.37, PCO2 26, PO2 96, bicarb 15 White cell count 18.7, hemoglobin 11.1, hematocrit 37.6, platelets 346. SARS COVID-19 negative. Urinalysis specific gravity 1.026, negative protein, 21-50 white cells, 0-5 red cells. Toxicology negative. Head CT negative for acute changes.  Chest radiograph no infiltrates. EKG had 19 bpm, normal axis, normal intervals, sinus rhythm, no ST segment or T wave changes. Patient was diagnosed with shock, he received intravenous fluids and broad-spectrum antibiotic therapy. Patient underwent CRRT04/03with further improvement of his kidney function. Peak CK 2,415 mild rhabdomyolysis likely not the culprit of renal failure. Patient very debilitated.  Patient with persistent fever despite broad spectrum antibiotic therapy, getting CT abdomen and pelvis which showed no acute intra-abdominal pathology.  Due to ongoing poor oral intake, we discussed need for at least temporary tube feeding.  Patient was amenable.  Coretrak was placed on 10/25/2020 and tube feeds initiated.  10/31/20: Patient was seen and examined at bedside.  He is more alert today.  Answering questions slowly but appropriately.  He denies have any pain.  Core track in  place.  Assessment/Plan: Active Problems:   Essential hypertension   AKI (acute kidney injury) ([place])   Alcohol dependence (HCC)   Pressure injury of skin   Malnutrition of moderate degree   Hypernatremia   Hyperkalemia   Acute metabolic encephalopathy   ATN (acute tubular necrosis) (HCC)   Cerebral atrophy (HCC)   Failure to thrive in adult   Fever  Improved acute metabolic encephalopathy  Uremic encephalopathy - resolved  Advanced cerebral atrophy Adult failure to thrive -metabolic derangements corrected since admission and he is technically oriented (although some worsening at times) but he is not thriving well at all; does not eat much and is so weak/deconditioned he may not have the physical reserve to survive this illness - for now, core track tube feeding to strengthen/re-condition as able; if fails, he would be more appropriate for hospice/comfort care and end of life discussions; Palliative care greatly assisting - s/p cortrak 4/15 and TF started on 10/25/20  Elevated TSH, possibly subclinical hypothyroidism. TSH greater than 7 Obtain free T4 Will likely need to repeat TSH in 6 to 8 weeks outpatient when acute illness has resolved.  Moderatecalorie protein malnutrition Poor oral intake - Continue with nutritional supplements, multivitamins,and thiamine. -Continue dysphagia diet; not taking in much nutrition and at risk for further decline/complications; ongoing Seltzer with daughter; PCM assisting discussions - agree with trial of mirtazapine - Cortrak placed on 4/15 and TF initiated  Intermittently tachycardia and fevers T4 7.6 Obtain free T4. Continue free fluid via core track to avoid dehydration  Resolved post with patient: Hypomagnesemia Serum magnesium 1.5>> 1.8. Repleted intravenously.  Resolved hypovolemic shock with AKI/ATN Resolved hypernatremia - 172 on admission Resolved AGMA 2/2 renal failure - s/p volume resuscitation on admission followed by  CRRT - renal function  now normalized  Sepsis 2/2 wounds (not present on admission). Patient with complex wounds, left hemithorax and left thigh - s/p abx course x 5 days -Continue local wound care Improve nutritional status.  HTN. BP is at goal. -Continue Lopressor  Alcohol abuse - continue folic, thiamine, MVI - PRN benzo  Aspiration pneumonitis (present on admission)  Continue with aspiration precautions. O2 saturation 97% on room air.  Goals of care Patient has a poor prognosis Bedbound, multiple decubitus ulcers, dysphagia requiring culture, minimally interactive, failure to thrive in an adult. Palliative care following, appreciate assistance. Currently the patient is full code.   Antimicrobials: Zosyn 4/3 - 4/4 Rocephin 4/10 >> 4/15 Vanc 4/10 >> 4/15  DVT prophylaxis: enoxaparin (LOVENOX) injection 40 mg Start: 10/17/20 1530 SCDs Start: 10/12/20 2213   Code Status:   Code Status: Full Code Family Communication:   Disposition Plan: Status is: Inpatient  Remains inpatient appropriate because:IV treatments appropriate due to intensity of illness or inability to take PO and Inpatient level of care appropriate due to severity of illness   Dispo: The patient is from: Home  Anticipated d/c is to: SNF  Patient currently is not medically stable to d/c.              Difficult to place patient No      Objective: Vitals:   10/31/20 0419 10/31/20 0838 10/31/20 1128 10/31/20 1158  BP: 105/60 117/64 104/66   Pulse:  (!) 113 97   Resp: 16 16 17    Temp: 98.3 F (36.8 C) 99.7 F (37.6 C) 100.1 F (37.8 C) 99 F (37.2 C)  TempSrc: Oral Oral Axillary Axillary  SpO2: 97% 100% 97%   Weight: 73 kg     Height:        Intake/Output Summary (Last 24 hours) at 10/31/2020 1703 Last data filed at 10/31/2020 1500 Gross per 24 hour  Intake 2060 ml  Output 645 ml  Net 1415 ml   Filed Weights   10/29/20 0605 10/30/20 0413  10/31/20 0419  Weight: 76.2 kg 74.4 kg 73 kg    Exam:  . General: 70 y.o. year-old male chronically ill-appearing no acute distress.  He is alert and oriented x3. . Cardiovascular: Tachycardic with no rubs or gallops.   Marland Kitchen Respiratory: Clear to auscultation no wheezes or rales.   . Abdomen: Soft nontender normal bowel sounds present.   . Musculoskeletal: Dependent edema in upper and lower extremities bilaterally.  Skin: Diffuse pressure wounds. . Psychiatry: Mood is appropriate for condition setting.   Data Reviewed: CBC: Recent Labs  Lab 10/27/20 0239 10/28/20 0540 10/29/20 0312 10/30/20 0304 10/30/20 1828 10/31/20 0310  WBC 12.3* 10.8* 10.9* 9.7  --  8.7  NEUTROABS 9.2* 7.9* 7.3 6.6  --  5.9  HGB 8.5* 8.9* 7.9* 7.7* 7.7* 7.9*  HCT 26.0* 27.3* 24.1* 24.0* 23.2* 23.7*  MCV 99.2 100.0 97.6 96.8  --  95.2  PLT 523* 418* 409* 379  --  782   Basic Metabolic Panel: Recent Labs  Lab 10/26/20 1814 10/27/20 0239 10/28/20 0540 10/29/20 0312 10/30/20 0304 10/31/20 0310  NA  --  134* 131* 129* 131* 130*  K  --  4.2 4.5 4.6 4.6 4.7  CL  --  104 100 96* 98 97*  CO2  --  23 25 24 27 26   GLUCOSE  --  134* 131* 134* 123* 121*  BUN  --  17 19 25* 26* 27*  CREATININE  --  0.60* 0.66 0.91 0.81 0.80  CALCIUM  --  9.8 10.1 10.0 9.9 9.7  MG 1.5* 1.4* 1.5* 1.6* 1.5* 1.8  PHOS 3.3 2.9  --  3.3 4.1 4.1   GFR: Estimated Creatinine Clearance: 87.1 mL/min (by C-G formula based on SCr of 0.8 mg/dL). Liver Function Tests: No results for input(s): AST, ALT, ALKPHOS, BILITOT, PROT, ALBUMIN in the last 168 hours. No results for input(s): LIPASE, AMYLASE in the last 168 hours. No results for input(s): AMMONIA in the last 168 hours. Coagulation Profile: No results for input(s): INR, PROTIME in the last 168 hours. Cardiac Enzymes: No results for input(s): CKTOTAL, CKMB, CKMBINDEX, TROPONINI in the last 168 hours. BNP (last 3 results) No results for input(s): PROBNP in the last 8760  hours. HbA1C: No results for input(s): HGBA1C in the last 72 hours. CBG: Recent Labs  Lab 10/30/20 2011 10/30/20 2349 10/31/20 0418 10/31/20 0836 10/31/20 1126  GLUCAP 133* 122* 138* 123* 126*   Lipid Profile: No results for input(s): CHOL, HDL, LDLCALC, TRIG, CHOLHDL, LDLDIRECT in the last 72 hours. Thyroid Function Tests: Recent Labs    10/31/20 0310  TSH 7.661*   Anemia Panel: No results for input(s): VITAMINB12, FOLATE, FERRITIN, TIBC, IRON, RETICCTPCT in the last 72 hours. Urine analysis:    Component Value Date/Time   COLORURINE YELLOW 10/20/2020 0815   APPEARANCEUR HAZY (A) 10/20/2020 0815   LABSPEC 1.020 10/20/2020 0815   PHURINE 5.0 10/20/2020 0815   GLUCOSEU >=500 (A) 10/20/2020 0815   HGBUR SMALL (A) 10/20/2020 0815   BILIRUBINUR NEGATIVE 10/20/2020 0815   KETONESUR 5 (A) 10/20/2020 0815   PROTEINUR 30 (A) 10/20/2020 0815   NITRITE NEGATIVE 10/20/2020 0815   LEUKOCYTESUR NEGATIVE 10/20/2020 0815   Sepsis Labs: @LABRCNTIP (procalcitonin:4,lacticidven:4)  ) Recent Results (from the past 240 hour(s))  Culture, blood (routine x 2)     Status: None (Preliminary result)   Collection Time: 10/27/20  1:22 PM   Specimen: BLOOD  Result Value Ref Range Status   Specimen Description BLOOD RIGHT ANTECUBITAL  Final   Special Requests   Final    BOTTLES DRAWN AEROBIC ONLY Blood Culture adequate volume   Culture   Final    NO GROWTH 4 DAYS Performed at [facility], [place] [address] [place], [place] [address]    Report Status PENDING  Incomplete  Culture, blood (routine x 2)     Status: None (Preliminary result)   Collection Time: 10/27/20  1:22 PM   Specimen: BLOOD  Result Value Ref Range Status   Specimen Description BLOOD LEFT ANTECUBITAL  Final   Special Requests   Final    BOTTLES DRAWN AEROBIC ONLY Blood Culture adequate volume   Culture   Final    NO GROWTH 4 DAYS Performed at [facility], [place] [address] [place], [place] [address]     Report Status PENDING  Incomplete      Studies: No results found.  Scheduled Meds: . collagenase   Topical Daily  . enoxaparin (LOVENOX) injection  40 mg Subcutaneous Q24H  . feeding supplement  237 mL Oral TID WC & HS  . feeding supplement (PROSource TF)  45 mL Per Tube BID  . folic acid  1 mg Per Tube Daily  . free water  100 mL Per Tube Q6H  . magnesium oxide  400 mg Per Tube BID  . mouth rinse  15 mL Mouth Rinse BID  . metoprolol tartrate  50 mg Per Tube BID  . mirtazapine  15 mg Oral QHS  .  multivitamin  15 mL Per Tube Daily  . sennosides  5 mL Per Tube QHS  . thiamine  100 mg Per Tube Daily    Continuous Infusions: . sodium chloride 1,000 mL (10/21/20 1514)  . feeding supplement (OSMOLITE 1.5 CAL) 1,000 mL (10/30/20 1728)     LOS: 19 days     Kayleen Memos, MD [facility] Pager [phone]  If 7PM-7AM, please contact night-coverage [email or web address] Password [facility] 10/31/2020, 5:03 PM

## 2020-10-31 NOTE — Progress Notes (Signed)
Physical Therapy Treatment Patient Details Name: Raymond Stone MRN: [id number] DOB: 12-Aug-1950 Today's Date: 10/31/2020    History of Present Illness Pt is a 70 y/o male admitted 10/12/20 after being found down for unkown length of time at home (apparently pt had not been seen for ~5 days). Workup for several pressure injuries, hypovolemic shock complicated by acute kidney injury. Underwent CRRT 4/3. Poor oral intake s/p cortrak 4/15. PMH includes HTN, alcohol dependence, malnutrition.    PT Comments    Pt remains limited by fatigue and profound weakness. Pt requires significant physical assistance to perform all functional mobility tasks at this time and fatigues rapidly with upright activity. Pt does demonstrate some improvement in UE strength compared to last session with this PT (10/24/2020), able to reach across body and with more movement against gravity. Pt has some improved awareness of deficits, reporting that attempts at standing would likely not go well due to weakness, which this PT is in agreement with. PT continues to recommend SNF placement if this aligns with patient goals of care. Acute PT will continue to follow.  Follow Up Recommendations  SNF;Supervision/Assistance - 24 hour     Equipment Recommendations  Wheelchair (measurements PT);Wheelchair cushion (measurements PT);Hospital bed (mechanical lift)    Recommendations for Other Services       Precautions / Restrictions Precautions Precautions: Fall;Other (comment) Precaution Comments: Bladder/bowel incontinence, multiple wounds; cortrak Required Braces or Orthoses: Other Brace Other Brace: bilateral prevalon boots Restrictions Weight Bearing Restrictions: No    Mobility  Bed Mobility Overal bed mobility: Needs Assistance Bed Mobility: Rolling;Supine to Sit;Sit to Supine Rolling: Max assist   Supine to sit: Max assist;HOB elevated Sit to supine: Total assist        Transfers                 General  transfer comment: pt declines attempts at transfers  Ambulation/Gait                 Stairs             Wheelchair Mobility    Modified Rankin (Stroke Patients Only)       Balance Overall balance assessment: Needs assistance Sitting-balance support: Feet supported;Single extremity supported;Bilateral upper extremity supported Sitting balance-Leahy Scale: Poor Sitting balance - Comments: min-modA posterior lean with LE exercise, minG for brief periods statically with BUE support. Pt requires cues for anterior trunk lean initially Postural control: Posterior lean                                  Cognition Arousal/Alertness: Awake/alert Behavior During Therapy: WFL for tasks assessed/performed Overall Cognitive Status: Impaired/Different from baseline Area of Impairment: Orientation;Memory;Following commands;Awareness;Safety/judgement;Problem solving                 Orientation Level: Disoriented to;Place;Time;Situation Current Attention Level: Alternating Memory: Decreased recall of precautions;Decreased short-term memory Following Commands: Follows one step commands with increased time Safety/Judgement: Decreased awareness of safety;Decreased awareness of deficits Awareness: Intellectual Problem Solving: Slow processing;Decreased initiation;Difficulty sequencing;Requires verbal cues;Requires tactile cues        Exercises General Exercises - Lower Extremity Ankle Circles/Pumps: AROM;Both;5 reps Quad Sets: AROM;Both;5 reps Gluteal Sets: AROM;Both;5 reps    General Comments General comments (skin integrity, edema, etc.): VSS on RA. Pt incontinent of stool upon arrival, PT assists in hygiene tasks.      Pertinent Vitals/Pain Pain Assessment: Faces Faces Pain Scale: Hurts whole lot  Pain Location: moaning with mobility, L flank wounds Pain Descriptors / Indicators: Moaning Pain Intervention(s): Monitored during session    Home Living                       Prior Function            PT Goals (current goals can now be found in the care plan section) Acute Rehab PT Goals Patient Stated Goal: to stand up again PT Goal Formulation: With patient Time For Goal Achievement: 11/14/20 Potential to Achieve Goals: Poor Progress towards PT goals: Not progressing toward goals - comment (remains limited by fatigue and pain)    Frequency    Min 2X/week      PT Plan Current plan remains appropriate    Co-evaluation              AM-PAC PT "6 Clicks" Mobility   Outcome Measure  Help needed turning from your back to your side while in a flat bed without using bedrails?: A Lot Help needed moving from lying on your back to sitting on the side of a flat bed without using bedrails?: A Lot Help needed moving to and from a bed to a chair (including a wheelchair)?: Total Help needed standing up from a chair using your arms (e.g., wheelchair or bedside chair)?: Total Help needed to walk in hospital room?: Total Help needed climbing 3-5 steps with a railing? : Total 6 Click Score: 8    End of Session   Activity Tolerance: Patient limited by fatigue Patient left: in bed;with call bell/phone within reach;with bed alarm set Nurse Communication: Mobility status;Need for lift equipment PT Visit Diagnosis: History of falling (Z91.81);Muscle weakness (generalized) (M62.81);Adult, failure to thrive (R62.7)     Time: 7322-0254 PT Time Calculation (min) (ACUTE ONLY): 44 min  Charges:  $Therapeutic Activity: 38-52 mins                     Zenaida Niece, PT, DPT Acute Rehabilitation Pager: [phone]    Zenaida Niece 10/31/2020, 3:47 PM

## 2020-11-01 ENCOUNTER — Inpatient Hospital Stay (HOSPITAL_COMMUNITY): Payer: Medicare Other

## 2020-11-01 LAB — CBC WITH DIFFERENTIAL/PLATELET
Abs Immature Granulocytes: 0.07 10*3/uL (ref 0.00–0.07)
Basophils Absolute: 0 10*3/uL (ref 0.0–0.1)
Basophils Relative: 1 %
Eosinophils Absolute: 0.5 10*3/uL (ref 0.0–0.5)
Eosinophils Relative: 7 %
HCT: 22.9 % — ABNORMAL LOW (ref 39.0–52.0)
Hemoglobin: 7.4 g/dL — ABNORMAL LOW (ref 13.0–17.0)
Immature Granulocytes: 1 %
Lymphocytes Relative: 15 %
Lymphs Abs: 1.1 10*3/uL (ref 0.7–4.0)
MCH: 30.7 pg (ref 26.0–34.0)
MCHC: 32.3 g/dL (ref 30.0–36.0)
MCV: 95 fL (ref 80.0–100.0)
Monocytes Absolute: 0.8 10*3/uL (ref 0.1–1.0)
Monocytes Relative: 11 %
Neutro Abs: 5.1 10*3/uL (ref 1.7–7.7)
Neutrophils Relative %: 65 %
Platelets: 342 10*3/uL (ref 150–400)
RBC: 2.41 MIL/uL — ABNORMAL LOW (ref 4.22–5.81)
RDW: 14.2 % (ref 11.5–15.5)
WBC: 7.7 10*3/uL (ref 4.0–10.5)
nRBC: 0 % (ref 0.0–0.2)

## 2020-11-01 LAB — CULTURE, BLOOD (ROUTINE X 2)
Culture: NO GROWTH
Culture: NO GROWTH
Special Requests: ADEQUATE
Special Requests: ADEQUATE

## 2020-11-01 LAB — PROCALCITONIN: Procalcitonin: 0.3 ng/mL

## 2020-11-01 LAB — GLUCOSE, CAPILLARY
Glucose-Capillary: 104 mg/dL — ABNORMAL HIGH (ref 70–99)
Glucose-Capillary: 113 mg/dL — ABNORMAL HIGH (ref 70–99)
Glucose-Capillary: 128 mg/dL — ABNORMAL HIGH (ref 70–99)
Glucose-Capillary: 137 mg/dL — ABNORMAL HIGH (ref 70–99)
Glucose-Capillary: 147 mg/dL — ABNORMAL HIGH (ref 70–99)
Glucose-Capillary: 95 mg/dL (ref 70–99)

## 2020-11-01 LAB — MRSA PCR SCREENING: MRSA by PCR: NEGATIVE

## 2020-11-01 LAB — BASIC METABOLIC PANEL
Anion gap: 10 (ref 5–15)
BUN: 29 mg/dL — ABNORMAL HIGH (ref 8–23)
CO2: 24 mmol/L (ref 22–32)
Calcium: 9.5 mg/dL (ref 8.9–10.3)
Chloride: 96 mmol/L — ABNORMAL LOW (ref 98–111)
Creatinine, Ser: 0.82 mg/dL (ref 0.61–1.24)
GFR, Estimated: >60 mL/min (ref >60)
Glucose, Bld: 132 mg/dL — ABNORMAL HIGH (ref 70–99)
Potassium: 4.4 mmol/L (ref 3.5–5.1)
Sodium: 130 mmol/L — ABNORMAL LOW (ref 135–145)

## 2020-11-01 LAB — MAGNESIUM: Magnesium: 1.7 mg/dL (ref 1.7–2.4)

## 2020-11-01 LAB — T4, FREE: Free T4: 0.9 ng/dL (ref 0.61–1.12)

## 2020-11-01 LAB — PHOSPHORUS: Phosphorus: 4.7 mg/dL — ABNORMAL HIGH (ref 2.5–4.6)

## 2020-11-01 MED ORDER — METOPROLOL TARTRATE 25 MG/10 ML ORAL SUSPENSION
25.0000 mg | Freq: Two times a day (BID) | ORAL | Status: DC
  Administered 2020-11-01 – 2020-11-06 (×11): 25 mg
  Filled 2020-11-01 (×11): qty 10

## 2020-11-01 NOTE — Progress Notes (Signed)
  Speech Language Pathology Treatment: Dysphagia  Patient Details Name: Raymond Stone MRN: [id number] DOB: [birth year] Today's Date: 11/01/2020 Time: 1012-1024 SLP Time Calculation (min) (ACUTE ONLY): 12 min  Assessment / Plan / Recommendation Clinical Impression  Pt has very little interest in PO intake, declining most POs offered and saying repeatedly that he only wants his sister's cooking. He cannot give me any examples of what he may normally like to eat. No overt signs of difficulty are noted with straw sips of thin liquids, but when attempting advanced trials of solids, he has guarded oral opening that allows for minimal intake. Mastication is prolonged, and there is a delayed cough that follows. Recommend maintaining Dys 2 (chopped) diet and thin liquids.    HPI HPI: 70 yo man with hx of HTN, lives alone, found down after not being seen for five days and admitted 4/2. Dx acute metabolic encephalopathy, acute kidney injury, hypovolemic shock, rhabdomylosis. BSE completed on 4/8 and after diet check by SLP, patient was discharged from speech services because of tolerating regular solids, thin liquids. BSE reordered on 4/11 as nursing observing patient to cough frequently with even smal sips of water.      SLP Plan  Continue with current plan of care       Recommendations  Diet recommendations: Dysphagia 2 (fine chop);Thin liquid Liquids provided via: Cup;Straw Medication Administration: Whole meds with liquid Supervision: Full supervision/cueing for compensatory strategies;Staff to assist with self feeding Compensations: Slow rate;Small sips/bites Postural Changes and/or Swallow Maneuvers: Seated upright 90 degrees                Oral Care Recommendations: Oral care QID Follow up Recommendations: Skilled Nursing facility;24 hour supervision/assistance SLP Visit Diagnosis: Dysphagia, unspecified (R13.10) Plan: Continue with current plan of care       GO                 Osie Bond., M.A. [place] [facility] Pager [phone] Office [phone]  11/01/2020, 12:45 PM

## 2020-11-01 NOTE — Progress Notes (Signed)
PROGRESS NOTE  Raymond Stone [id number] DOB: July 29, 1950 DOA: 10/12/2020 PCP: [name], NP  HPI/Recap of past 24 hours: Raymond Stone admitted to the hospital with working diagnosis hypovolemic shock complicated by acute kidney injury/ATN. 70 year old male past medical history of hypertension who was found down at his home, apparently he was not seen for about 5 days. When EMS arrived he was severely hypotensive, his temperature was 100 F he received intravenous fluids and transported to the emergency room.   Per his daughter Raymond Stone via phone on 11/01/2020 the patient was fully functional prior to this.  Was ambulating and was independent with all his ADLs.  Admission CT head was nonacute.  Sodium 172, potassium 7.3, chloride>130,bicarb 15, glucose 156, BUN 211, creatinine 11.2, AST 118, ALT 42, total bilirubin is 1.5, troponin I 489-395, lactic acid 5.3. anion gap 49. ABG, pH 7.37, PCO2 26, PO2 96, bicarb 15 White cell count 18.7, hemoglobin 11.1, hematocrit 37.6, platelets 346. SARS COVID-19 negative. Urinalysis specific gravity 1.026, negative protein, 21-50 white cells, 0-5 red cells. Toxicology negative. Head CT negative for acute changes.  Chest radiograph no infiltrates. EKG had 19 bpm, normal axis, normal intervals, sinus rhythm, no ST segment or T wave changes. Patient was diagnosed with shock, he received intravenous fluids and broad-spectrum antibiotic therapy. Patient underwent CRRT04/03with further improvement of his kidney function. Peak CK 2,415 mild rhabdomyolysis likely not the culprit of renal failure. Patient very debilitated.  CT abdomen and pelvis done on 10/23/2020 which showed no acute intra-abdominal pathology.  Due to ongoing poor oral intake, we discussed need for at least temporary tube feeding.  Patient was amenable.  Coretrak was placed on 10/25/2020 and tube feeds initiated, cortract was clamped on 11/01/2020 for calorie count.  On  10/31/2020 the patient was more alert and answering questions appropriately.  11/01/20: Seen at his bedside in the morning.  He is alert and oriented x4.  He has no new complaints.  Febrile.  Repeat blood cultures and obtain MRSA screening and procalcitonin level.  Assessment/Plan: Active Problems:   Essential hypertension   AKI (acute kidney injury) ([place])   Alcohol dependence (HCC)   Pressure injury of skin   Malnutrition of moderate degree   Hypernatremia   Hyperkalemia   Acute metabolic encephalopathy   ATN (acute tubular necrosis) (HCC)   Cerebral atrophy (HCC)   Failure to thrive in adult   Fever  Resolved acute metabolic encephalopathy  Resolved uremic encephalopathy Advanced cerebral atrophy Adult failure to thrive -metabolic derangements corrected since admission and he is technically oriented (although some worsening at times) but he is not thriving well at all; does not eat much and is so weak/deconditioned he may not have the physical reserve to survive this illness - for now, core track tube feeding to strengthen/re-condition as able; if fails, he would be more appropriate for hospice/comfort care and end of life discussions; Palliative care greatly assisting - s/p cortrak 4/15 and TF started on 10/25/20  Generalized weakness affecting upper extremities -CT head, CT cervical spine. Follow results. Continue PT OT as tolerated and with assistance.  Subclinical hypothyroidism. TSH greater than 7, free T4 normal 0.90 Repeat TSH in 6 to 8 weeks or when acute illness has resolved  Moderatecalorie protein malnutrition Poor oral intake - Continue with nutritional supplements, multivitamins,and thiamine. -Continue dysphagia diet; not taking in much nutrition and at risk for further decline/complications; ongoing [place] with daughter; [facility] assisting discussions - agree with trial of mirtazapine - Cortrak placed on  4/15 and TF initiated, clamped on 11/01/2020 for calorie  counts  Intermittently tachycardia and fevers Obtain blood cultures, MRSA screen and procalcitonin level.  Resolved post with patient: Hypomagnesemia Continue replacement via core track tube feeding.  Resolved hypovolemic shock with AKI/ATN Resolved hypernatremia - 172 on admission Resolved AGMA 2/2 renal failure - s/p volume resuscitation on admission followed by CRRT - renal function now normalized  Sepsis 2/2 wounds (not present on admission). Patient with complex wounds, left hemithorax and left thigh - s/p abx course x 5 days Continue local wound care Continue to improve nutritional status.  HTN. -Continue Lopressor BP is currently at goal  Alcohol abuse - continue folic, thiamine, MVI - PRN benzo  Aspiration pneumonitis (present on admission)  Continue with aspiration precautions. O2 saturation 97% on room air.  Goals of care Bedbound, multiple decubitus ulcers, dysphagia requiring culture, minimally interactive, failure to thrive in an adult. Palliative care following, appreciate assistance. Currently the patient is full code.   Antimicrobials: Zosyn 4/3 - 4/4 Rocephin 4/10 >> 4/15 Vanc 4/10 >> 4/15  DVT prophylaxis: enoxaparin (LOVENOX) injection 40 mg Start: 10/17/20 1530 SCDs Start: 10/12/20 2213   Code Status:   Code Status: Full Code Family Communication:  Updated his daughter Raymond Stone via phone on 10/31/2020 and 11/01/2020.  Disposition Plan: Status is: Inpatient  Remains inpatient appropriate because:IV treatments appropriate due to intensity of illness or inability to take PO and Inpatient level of care appropriate due to severity of illness   Dispo: The patient is from: Home  Anticipated d/c is to: SNF  Patient currently is not medically stable to d/c.              Difficult to place patient No      Objective: Vitals:   11/01/20 0355 11/01/20 0738 11/01/20 1229 11/01/20 1559  BP: 97/61 107/63 115/64  100/61  Pulse: 100 (!) 106  97  Resp: 16 18 19    Temp: (!) 100.5 F (38.1 C) 99.6 F (37.6 C) 100 F (37.8 C) (!) 100.5 F (38.1 C)  TempSrc: Axillary Oral Oral Oral  SpO2: 94%     Weight:      Height:        Intake/Output Summary (Last 24 hours) at 11/01/2020 1719 Last data filed at 11/01/2020 1328 Gross per 24 hour  Intake 0 ml  Output 1600 ml  Net -1600 ml   Filed Weights   10/29/20 0605 10/30/20 0413 10/31/20 0419  Weight: 76.2 kg 74.4 kg 73 kg    Exam:  . General: 71 y.o. year-old male chronically ill-appearing no acute distress.  He is alert and oriented x3.   . Cardiovascular: Regular rate and rhythm no rubs or gallops. Marland Kitchen Respiratory: Clear to auscultation no wheezes or rales.   . Abdomen: Soft nontender normal bowel sounds present.   . Musculoskeletal: Mild dependent edema in upper and lower extremities bilaterally.   . Skin: Diffuse pressure wounds in lower extremities bilaterally.   Marland Kitchen Psychiatry: Mood is appropriate for condition and setting.   Data Reviewed: CBC: Recent Labs  Lab 10/28/20 0540 10/29/20 0312 10/30/20 0304 10/30/20 1828 10/31/20 0310 11/01/20 0219  WBC 10.8* 10.9* 9.7  --  8.7 7.7  NEUTROABS 7.9* 7.3 6.6  --  5.9 5.1  HGB 8.9* 7.9* 7.7* 7.7* 7.9* 7.4*  HCT 27.3* 24.1* 24.0* 23.2* 23.7* 22.9*  MCV 100.0 97.6 96.8  --  95.2 95.0  PLT 418* 409* 379  --  356 102   Basic Metabolic  Panel: Recent Labs  Lab 10/27/20 0239 10/28/20 0540 10/29/20 0312 10/30/20 0304 10/31/20 0310 11/01/20 0219  NA 134* 131* 129* 131* 130* 130*  K 4.2 4.5 4.6 4.6 4.7 4.4  CL 104 100 96* 98 97* 96*  CO2 23 25 24 27 26 24   GLUCOSE 134* 131* 134* 123* 121* 132*  BUN 17 19 25* 26* 27* 29*  CREATININE 0.60* 0.66 0.91 0.81 0.80 0.82  CALCIUM 9.8 10.1 10.0 9.9 9.7 9.5  MG 1.4* 1.5* 1.6* 1.5* 1.8 1.7  PHOS 2.9  --  3.3 4.1 4.1 4.7*   GFR: Estimated Creatinine Clearance: 85 mL/min (by C-G formula based on SCr of 0.82 mg/dL). Liver Function Tests: No  results for input(s): AST, ALT, ALKPHOS, BILITOT, PROT, ALBUMIN in the last 168 hours. No results for input(s): LIPASE, AMYLASE in the last 168 hours. No results for input(s): AMMONIA in the last 168 hours. Coagulation Profile: No results for input(s): INR, PROTIME in the last 168 hours. Cardiac Enzymes: No results for input(s): CKTOTAL, CKMB, CKMBINDEX, TROPONINI in the last 168 hours. BNP (last 3 results) No results for input(s): PROBNP in the last 8760 hours. HbA1C: No results for input(s): HGBA1C in the last 72 hours. CBG: Recent Labs  Lab 11/01/20 0017 11/01/20 0353 11/01/20 0758 11/01/20 1210 11/01/20 1557  GLUCAP 128* 137* 147* 113* 104*   Lipid Profile: No results for input(s): CHOL, HDL, LDLCALC, TRIG, CHOLHDL, LDLDIRECT in the last 72 hours. Thyroid Function Tests: Recent Labs    10/31/20 0310 11/01/20 0219  TSH 7.661*  --   FREET4  --  0.90   Anemia Panel: No results for input(s): VITAMINB12, FOLATE, FERRITIN, TIBC, IRON, RETICCTPCT in the last 72 hours. Urine analysis:    Component Value Date/Time   COLORURINE YELLOW 10/20/2020 0815   APPEARANCEUR HAZY (A) 10/20/2020 0815   LABSPEC 1.020 10/20/2020 0815   PHURINE 5.0 10/20/2020 0815   GLUCOSEU >=500 (A) 10/20/2020 0815   HGBUR SMALL (A) 10/20/2020 0815   BILIRUBINUR NEGATIVE 10/20/2020 0815   KETONESUR 5 (A) 10/20/2020 0815   PROTEINUR 30 (A) 10/20/2020 0815   NITRITE NEGATIVE 10/20/2020 0815   LEUKOCYTESUR NEGATIVE 10/20/2020 0815   Sepsis Labs: @LABRCNTIP (procalcitonin:4,lacticidven:4)  ) Recent Results (from the past 240 hour(s))  Culture, blood (routine x 2)     Status: None   Collection Time: 10/27/20  1:22 PM   Specimen: BLOOD  Result Value Ref Range Status   Specimen Description BLOOD RIGHT ANTECUBITAL  Final   Special Requests   Final    BOTTLES DRAWN AEROBIC ONLY Blood Culture adequate volume   Culture   Final    NO GROWTH 5 DAYS Performed at [facility], [place] [address]  [place], [place] [address]    Report Status 11/01/2020 FINAL  Final  Culture, blood (routine x 2)     Status: None   Collection Time: 10/27/20  1:22 PM   Specimen: BLOOD  Result Value Ref Range Status   Specimen Description BLOOD LEFT ANTECUBITAL  Final   Special Requests   Final    BOTTLES DRAWN AEROBIC ONLY Blood Culture adequate volume   Culture   Final    NO GROWTH 5 DAYS Performed at [facility], [place] [address] [place],  [address]    Report Status 11/01/2020 FINAL  Final      Studies: No results found.  Scheduled Meds: . collagenase   Topical Daily  . enoxaparin (LOVENOX) injection  40 mg Subcutaneous Q24H  .  feeding supplement  237 mL Oral TID WC & HS  . feeding supplement (PROSource TF)  45 mL Per Tube BID  . folic acid  1 mg Per Tube Daily  . free water  100 mL Per Tube Q6H  . magnesium oxide  400 mg Per Tube BID  . mouth rinse  15 mL Mouth Rinse BID  . metoprolol tartrate  25 mg Per Tube BID  . mirtazapine  15 mg Oral QHS  . multivitamin  15 mL Per Tube Daily  . sennosides  5 mL Per Tube QHS  . thiamine  100 mg Per Tube Daily    Continuous Infusions: . sodium chloride 1,000 mL (10/21/20 1514)  . feeding supplement (OSMOLITE 1.5 CAL) 1,000 mL (11/01/20 0412)     LOS: 20 days     Kayleen Memos, MD [facility] Pager [phone]  If 7PM-7AM, please contact night-coverage [email or web address] Password [facility] 11/01/2020, 5:19 PM

## 2020-11-01 NOTE — Care Management Important Message (Signed)
Important Message  Patient Details  Name: Raymond Stone MRN: [id number] Date of Birth: [birth year]   Medicare Important Message Given:  Yes     Shelda Altes 11/01/2020, 10:56 AM

## 2020-11-01 NOTE — Progress Notes (Signed)
Palliative Medicine Inpatient Follow Up Note  HPI:  70 y.o.malewith past medical history of HTN and ETOH abuseadmitted on 4/2/2022after being found down at home - had not been seen for 5 days.He was diagnosed with shock and received IV fluids and antibiotics. Did require CRRT d/t AKI. Renal function now improved but patient remains confused, refusing meds and PO intake. New fevers 4/10 and antibiotics restarted - concern for wound infection. PMT consulted to discuss Raymond Stone.  Today's Discussion (11/01/2020):  *Please note that this is a verbal dictation therefore any spelling or grammatical errors are due to the "[employer]" system interpretation.  Chart reviewed.  It does not appear that calorie count has been initiated.  Reached out to medical and nursing teams to ensure that this starts today.  I met with Raymond Stone at bedside this morning he was alert and pleasant.  He shares with me that he is gotten very little sleep and requests that I pull the blinds and turn the lights off so that he may rest this morning.  Otherwise he denies pain and nausea.  I spoke with patient's daughter this afternoon.  Reviewed that we are still awaiting calorie count for nutrition before proceeding with additional goals of care conversations.  Raymond Stone and I further discussed CODE STATUS.  Raymond Stone shares with me that she plans to be in the hospital on Sunday and would like [place] all to have these conversations together.  We reviewed that this would be the best way to better ascertain what Raymond Stone's wishes would be moving forward.  Discussed the importance of continued conversation with family and their  medical providers regarding overall plan of care and treatment options, ensuring decisions are within the context of the patients values and GOCs.  Questions and concerns addressed   Objective Assessment: Vital Signs Vitals:   11/01/20 0355 11/01/20 0738  BP: 97/61 107/63  Pulse: 100 (!) 106  Resp: 16 18   Temp: (!) 100.5 F (38.1 C) 99.6 F (37.6 C)  SpO2: 94%     Intake/Output Summary (Last 24 hours) at 11/01/2020 0830 Last data filed at 11/01/2020 0357 Gross per 24 hour  Intake 985 ml  Output 1100 ml  Net -115 ml   Last Weight  Most recent update: 10/31/2020  5:21 AM   Weight  73 kg (161 lb)           Physical Exam Constitutional:      General: He is not in acute distress. Pulmonary:     Effort: Pulmonary effort is normal.  Skin:    General: Skin is warm and dry.  Neurological:     Mental Status: He is oriented to person and place  SUMMARY OF RECOMMENDATIONS   Full Code / Full Scope of Care  Plan for calorie count in the oncoming days  Further conversations to take place at the end of the week regarding whether to continue with aggressive measures of transition our focus to more of a comfort oriented path  Will meet with patient's daughter Raymond Stone on Sunday at 3 PM  Ongoing incremental PMT support  Symptoms: FTT: Continue Mirtazepine  Time Spent: 25 Greater than 50% of the time was spent in counseling and coordination of care ______________________________________________________________________________________ [place] Team Team Cell Phone: [phone] Please utilize secure chat with additional questions, if there is no response within 30 minutes please call the above phone number  Palliative Medicine Team providers are available by phone from 7am to 7pm  daily and can be reached through the team cell phone.  Should this patient require assistance outside of these hours, please call the patient's attending physician.

## 2020-11-01 NOTE — Progress Notes (Signed)
Nutrition Follow-up  DOCUMENTATION CODES:   Non-severe (moderate) malnutrition in context of chronic illness  INTERVENTION:  48 hour calorie count initiated.  Provide Ensure Enlive po TID, each supplement provides 350 kcal and 20 grams of protein.  Provide Magic cup TID with meals, each supplement provides 290 kcal and 9 grams of protein.  If po intake does not improve, Resume Osmolite 1.5 cal formula via Cortrak NGT at goal rate of 65 ml/hr with 45 ml Prosource TF BID per tube.   Tube feeding regimen provides 2420 kcal, 120 grams of protein, and 1186 ml free water.   NUTRITION DIAGNOSIS:   Moderate Malnutrition related to chronic illness (CHF) as evidenced by mild fat depletion,moderate muscle depletion; ongoing  GOAL:   Patient will meet greater than or equal to 90% of their needs; progressing  MONITOR:   PO intake,Supplement acceptance,Weight trends,Labs,I & O's,Diet advancement,Skin  REASON FOR ASSESSMENT:   Consult Enteral/tube feeding initiation and management  ASSESSMENT:   Patient with PMH significant for HTN, peripheral edema, CHF, and alcohol use disorder. Presents this admission with suspected uremic encephalopathy and AKI.  4/3- start CRRT 4/4- stop CRRT due to pressure alarming 4/6 - HD Cath removed   Pt more alert today. Pt continues on a dysphagia 2 diet with thin liquids. Meal completion has been 5% today. Pt reports she will try to eat more throughout the day. RD to continue with Ensure supplements to aid in PO. Cortrak NGT clamped today for po trial. Palliative following to discuss with family regarding pt goals of care. Calorie count requested via MD not that pt more alert. RD to follow up with full calorie count results in Monday, 4/25. If po intake inadequate, recommend resuming tube feeding orders pending goals of care.   Labs and medications reviewed.   Diet Order:   Diet Order            DIET DYS 2 Room service appropriate? Yes; Fluid  consistency: Thin  Diet effective now                 EDUCATION NEEDS:   Not appropriate for education at this time  Skin:  Skin Assessment: Reviewed RN Assessment Skin Integrity Issues:: Unstageable,DTI DTI: L elbow, R toe, bilateral ankles, bilateral knees Stage I: N/A Stage II: N/A Unstageable: L thigh, L flank Other: skin tear scrotum  Last BM:  4/21  Height:   Ht Readings from Last 1 Encounters:  10/13/20 5\' 9"  (1.753 m)    Weight:   Wt Readings from Last 1 Encounters:  10/31/20 73 kg   BMI:  Body mass index is 23.78 kg/m.  Estimated Nutritional Needs:   Kcal:  2300-2500 kcal  Protein:  115-130 grams  Fluid:  >/= 2 L/day  Corrin Parker, MS, RD, LDN RD pager number/after hours weekend pager number on Amion.

## 2020-11-01 NOTE — Progress Notes (Signed)
Occupational Therapy Treatment Patient Details Name: Raymond Stone MRN: [id number] DOB: 10-03-1950 Today's Date: 11/01/2020    History of present illness Pt is a 70 y/o male admitted 10/12/20 after being found down for unkown length of time at home (apparently pt had not been seen for ~5 days). Workup for several pressure injuries, hypovolemic shock complicated by acute kidney injury. Underwent CRRT 4/3. Poor oral intake s/p cortrak 4/15. PMH includes HTN, alcohol dependence, malnutrition.   OT comments  Pt making incremental progress towards OT goals. Pt was more alert this session, able to tell OT about pain and requesting OT help him move his legs. He tolerated movement and exercises well, stating that made him feel better. Pt feels that he is not ready to get OOB yet, due to pain and weakness. OT encouraged pt and attempted a long sit, but pt was unable to tolerate/maintain at this time. OT will continue to follow to encourage movement and progress towards achieving OT goals.    Follow Up Recommendations  SNF;Supervision/Assistance - 24 hour    Equipment Recommendations  Other (comment) (Defer to next venue)    Recommendations for Other Services      Precautions / Restrictions Precautions Precautions: Fall;Other (comment) Precaution Comments: Bladder/bowel incontinence, multiple wounds; cortrak Required Braces or Orthoses: Other Brace Other Brace: bilateral prevalon boots       Mobility Bed Mobility Overal bed mobility: Needs Assistance Bed Mobility: Rolling Rolling: Max assist         General bed mobility comments: Pt repositioned to provide pressure relief, requiring total assist +2 to roll to 1 side.    Transfers Overall transfer level: Needs assistance Equipment used: None             General transfer comment: pt declines attempts at transfers    Balance Overall balance assessment: Needs assistance   Sitting balance-Leahy Scale: Poor Sitting balance -  Comments: attempted log sit, unable to maintain       Standing balance comment: Unable to stand                           ADL either performed or assessed with clinical judgement   ADL Overall ADL's : Needs assistance/impaired Eating/Feeding: NPO;Bed level Eating/Feeding Details (indicate cue type and reason): Refusing to eat any of his lunch, stating he didn't feel like eating. Grooming: Wash/dry face;Maximal assistance;Bed level Grooming Details (indicate cue type and reason): OT cleaned pt's face and placed the washcloth in his hand, pt attempted to bring it to his face with verbal cues, however ended up passing the cloth back and forth between his hands.                               General ADL Comments: Pt bed level at this time, reporting that he needs his pain to lessen before trying to sit. Pt orientation and abiilty to follow commands limiting ability to sit EOB as well.     Vision       Perception     Praxis      Cognition Arousal/Alertness: Awake/alert Behavior During Therapy: WFL for tasks assessed/performed Overall Cognitive Status: Impaired/Different from baseline Area of Impairment: Orientation;Memory;Following commands;Awareness;Safety/judgement;Problem solving                 Orientation Level: Disoriented to;Place;Time;Situation Current Attention Level: Alternating Memory: Decreased recall of precautions;Decreased short-term memory Following Commands: Follows one  step commands with increased time Safety/Judgement: Decreased awareness of safety;Decreased awareness of deficits Awareness: Intellectual Problem Solving: Slow processing;Decreased initiation;Difficulty sequencing;Requires verbal cues;Requires tactile cues General Comments: Pt more alert this session, still confused reporting that he was in [place] and his brothers and sisters were waiting on him.        Exercises General Exercises - Upper Extremity Shoulder  Flexion: AAROM;Both;10 reps;Supine Shoulder Extension: AAROM;Both;10 reps;Supine Elbow Flexion: AAROM;Both;15 reps;Supine Elbow Extension: AAROM;Both;15 reps;Supine Wrist Flexion: AAROM;Both;5 reps Wrist Extension: AAROM;Both;5 reps Digit Composite Flexion: AROM;Both;5 reps   Shoulder Instructions       General Comments      Pertinent Vitals/ Pain       Pain Assessment: 0-10 Pain Score: 6  Faces Pain Scale: Hurts a little bit Pain Location: Back Pain Descriptors / Indicators: Aching;Grimacing;Discomfort Pain Intervention(s): Monitored during session;Repositioned;Patient requesting pain meds-RN notified  Home Living                                          Prior Functioning/Environment              Frequency  Min 2X/week        Progress Toward Goals  OT Goals(current goals can now be found in the care plan section)  Progress towards OT goals: Progressing toward goals  Acute Rehab OT Goals Patient Stated Goal: None stated OT Goal Formulation: Patient unable to participate in goal setting Time For Goal Achievement: 11/15/20 Potential to Achieve Goals: Fair ADL Goals Pt Will Perform Eating: with set-up;sitting;bed level Pt Will Perform Grooming: with min assist;sitting Pt Will Perform Upper Body Dressing: with min assist;sitting Pt Will Transfer to Toilet: with mod assist;bedside commode;stand pivot transfer  Plan Discharge plan remains appropriate;Frequency remains appropriate    Co-evaluation                 AM-PAC OT "6 Clicks" Daily Activity     Outcome Measure   Help from another person eating meals?: Total Help from another person taking care of personal grooming?: Total Help from another person toileting, which includes using toliet, bedpan, or urinal?: Total Help from another person bathing (including washing, rinsing, drying)?: Total Help from another person to put on and taking off regular upper body clothing?:  Total Help from another person to put on and taking off regular lower body clothing?: Total 6 Click Score: 6    End of Session    OT Visit Diagnosis: Unsteadiness on feet (R26.81);Muscle weakness (generalized) (M62.81)   Activity Tolerance Patient limited by pain;Patient limited by lethargy   Patient Left in bed;with call bell/phone within reach;with bed alarm set   Nurse Communication Mobility status        Time: 4098-1191 OT Time Calculation (min): 13 min  Charges: OT General Charges $OT Visit: 1 Visit OT Treatments $Therapeutic Activity: 8-22 mins  Celia Friedland H., OTR/L Acute Rehabilitation  Amyriah Buras Elane Yolanda Bonine 11/01/2020, 1:59 PM

## 2020-11-01 NOTE — Consult Note (Signed)
[facility] Nurse wound follow up Notified by PT, Rolinda Roan, that hydrotherapy to begin MWF starting next week on Monday, and left lower flank wound is not 100% clean.  Orders modified to reflect wound care needs.  Saline moistened gauze to left lower flank wound by primary nurse twice daily beginning 4/23.   Val Riles, RN, MSN, CWOCN, CNS-BC, pager [phone]

## 2020-11-01 NOTE — Progress Notes (Signed)
Physical Therapy Wound Treatment Patient Details  Name: Raymond Stone MRN: [id number] Date of Birth: 1951/01/31  Today's Date: 11/01/2020 Time: 1696-7893 Time Calculation (min): 51 min  Subjective  Subjective Assessment Subjective: Pleasant and agreeable to hydrotherapy however still appears confused at times. Patient and Family Stated Goals: None stated Date of Onset:  (Unsure) Prior Treatments: Dressing changes prior to hydro being consulted.  Pain Score:  Pt did not complain of pain throughout session however noted grimacing at times. When asked about pain, pt denies.   Wound Assessment  Pressure Injury 10/13/20 Thigh Anterior;Left;Proximal Unstageable - Full thickness tissue loss in which the base of the injury is covered by slough (yellow, tan, gray, green or brown) and/or eschar (tan, brown or black) in the wound bed. (Active)  Wound Image   11/01/20 1317  Dressing Type ABD;Barrier Film (skin prep);Gauze (Comment);Moist to moist;Santyl 11/01/20 1317  Dressing Changed;Clean;Dry;Intact 11/01/20 1317  Dressing Change Frequency Monday, Wednesday, Friday 11/01/20 1317  State of Healing Early/partial granulation 11/01/20 1317  Site / Wound Assessment Brown;Pink;Yellow 11/01/20 1317  % Wound base Red or Granulating 25% 11/01/20 1317  % Wound base Yellow/Fibrinous Exudate 70% 11/01/20 1317  % Wound base Black/Eschar 5% 11/01/20 1317  % Wound base Other/Granulation Tissue (Comment) 0% 11/01/20 1317  Peri-wound Assessment Intact 11/01/20 1317  Wound Length (cm) 14.2 cm 11/01/20 1051  Wound Width (cm) 8.5 cm 11/01/20 1051  Wound Depth (cm) 3.8 cm 11/01/20 1051  Wound Surface Area (cm^2) 120.7 cm^2 11/01/20 1051  Wound Volume (cm^3) 458.66 cm^3 11/01/20 1051  Tunneling (cm) 0 11/01/20 1317  Undermining (cm) 0 11/01/20 1317  Margins Unattached edges (unapproximated) 11/01/20 1317  Drainage Amount Minimal 11/01/20 1317  Drainage Description Serosanguineous 11/01/20 1317  Treatment  Debridement (Selective);Hydrotherapy (Pulse lavage);Packing (Saline gauze) 11/01/20 1317     Pressure Injury 10/13/20 Flank Left;Upper Unstageable - Full thickness tissue loss in which the base of the injury is covered by slough (yellow, tan, gray, green or brown) and/or eschar (tan, brown or black) in the wound bed. (Active)  Wound Image   11/01/20 1317  Dressing Type Barrier Film (skin prep);Foam - Lift dressing to assess site every shift;Gauze (Comment);Moist to moist;Santyl 11/01/20 1317  Dressing Changed;Clean;Dry;Intact 11/01/20 1317  Dressing Change Frequency Monday, Wednesday, Friday 11/01/20 1317  State of Healing Early/partial granulation 11/01/20 1317  Site / Wound Assessment Pink;Yellow 11/01/20 1317  % Wound base Red or Granulating 25% 11/01/20 1317  % Wound base Yellow/Fibrinous Exudate 75% 11/01/20 1317  % Wound base Black/Eschar 0% 11/01/20 1317  % Wound base Other/Granulation Tissue (Comment) 0% 11/01/20 1317  Peri-wound Assessment Intact 11/01/20 1317  Wound Length (cm) 4.8 cm 11/01/20 1051  Wound Width (cm) 13 cm 11/01/20 1051  Wound Depth (cm) 1.8 cm 11/01/20 1051  Wound Surface Area (cm^2) 62.4 cm^2 11/01/20 1051  Wound Volume (cm^3) 112.32 cm^3 11/01/20 1051  Tunneling (cm) 0 11/01/20 1317  Undermining (cm) 0 11/01/20 1317  Margins Unattached edges (unapproximated) 11/01/20 1317  Drainage Amount Minimal 10/31/20 1341  Drainage Description Serosanguineous 11/01/20 1317  Treatment Debridement (Selective);Hydrotherapy (Pulse lavage);Packing (Saline gauze) 11/01/20 1317     Pressure Injury 10/13/20 Flank Left;Mid Unstageable - Full thickness tissue loss in which the base of the injury is covered by slough (yellow, tan, gray, green or brown) and/or eschar (tan, brown or black) in the wound bed. (Active)  Wound Image   11/01/20 1317  Dressing Type Barrier Film (skin prep);Foam - Lift dressing to assess site every shift;Gauze (Comment);Moist to moist;Santyl  11/01/20 1317   Dressing Changed;Clean;Dry;Intact 11/01/20 1317  Dressing Change Frequency Monday, Wednesday, Friday 11/01/20 1317  State of Healing Early/partial granulation 11/01/20 1317  Site / Wound Assessment Red 11/01/20 1317  % Wound base Red or Granulating 100% 11/01/20 1317  % Wound base Yellow/Fibrinous Exudate 0% 11/01/20 1317  % Wound base Black/Eschar 0% 11/01/20 1317  % Wound base Other/Granulation Tissue (Comment) 0% 11/01/20 1317  Peri-wound Assessment Intact 11/01/20 1317  Wound Length (cm) 5.5 cm 11/01/20 1051  Wound Width (cm) 3 cm 11/01/20 1051  Wound Depth (cm) 0.3 cm 11/01/20 1051  Wound Surface Area (cm^2) 16.5 cm^2 11/01/20 1051  Wound Volume (cm^3) 4.95 cm^3 11/01/20 1051  Tunneling (cm) 0 11/01/20 1317  Undermining (cm) 0 11/01/20 1317  Margins Unattached edges (unapproximated) 11/01/20 1317  Drainage Amount Minimal 11/01/20 1317  Drainage Description Serous 11/01/20 1317  Treatment Hydrotherapy (Pulse lavage);Packing (Saline gauze) 11/01/20 1317      Hydrotherapy Pulsed lavage therapy - wound location: L flank (upper and lower), L thigh Pulsed Lavage with Suction (psi): 12 psi Pulsed Lavage with Suction - Normal Saline Used: 2000 mL Pulsed Lavage Tip: Tip with splash shield Selective Debridement Selective Debridement - Location: upper L flank wound, L thigh Selective Debridement - Tools Used: Forceps,Scalpel Selective Debridement - Tissue Removed: eschar, yellow slough, necrosing adipose    Wound Assessment and Plan  Wound Therapy - Assess/Plan/Recommendations Wound Therapy - Clinical Statement: L lower flank wound clean and no longer requires hydrotherapy services at this time. L upper flank wound and L thigh wound are appropriate for decrease in frequency to 3x/week (MWF) for selective removal of unviable tissue, to decrease bioburden, and promote wound bed healing. Wound Therapy - Functional Problem List: Global weakness in the setting of undetermined time down at  home. Factors Delaying/Impairing Wound Healing: Infection - systemic/local,Immobility Hydrotherapy Plan: Debridement,Dressing change,Patient/family education,Pulsatile lavage with suction Wound Therapy - Frequency: 3X / week Wound Therapy - Current Recommendations: PT Wound Therapy - Follow Up Recommendations: dressing changes by RN  Wound Therapy Goals- Improve the function of patient's integumentary system by progressing the wound(s) through the phases of wound healing (inflammation - proliferation - remodeling) by: Wound Therapy Goals - Improve the function of patient's integumentary system by progressing the wound(s) through the phases of wound healing by: Decrease Necrotic Tissue to: 20% Decrease Necrotic Tissue - Progress: Progressing toward goal Increase Granulation Tissue to: 80% Increase Granulation Tissue - Progress: Progressing toward goal Goals/treatment plan/discharge plan were made with and agreed upon by patient/family: No, Patient unable to participate in goals/treatment/discharge plan and family unavailable Time For Goal Achievement: 7 days Wound Therapy - Potential for Goals: Fair  Goals will be updated until maximal potential achieved or discharge criteria met.  Discharge criteria: when goals achieved, discharge from hospital, MD decision/surgical intervention, no progress towards goals, refusal/missing three consecutive treatments without notification or medical reason.  GP     Charges PT Wound Care Charges $Wound Debridement up to 20 cm: < or equal to 20 cm $ Wound Debridement each add'l 20 sqcm: 6 $PT PLS Gun and Tip: 1 Supply $PT Hydrotherapy Visit: 2 Visits        D  11/01/2020, 1:34 PM    , PT, DPT Acute Rehabilitation Services Pager: [phone] Office: [phone]    

## 2020-11-02 ENCOUNTER — Inpatient Hospital Stay (HOSPITAL_COMMUNITY): Payer: Medicare Other

## 2020-11-02 LAB — BASIC METABOLIC PANEL
Anion gap: 9 (ref 5–15)
BUN: 36 mg/dL — ABNORMAL HIGH (ref 8–23)
CO2: 26 mmol/L (ref 22–32)
Calcium: 10.1 mg/dL (ref 8.9–10.3)
Chloride: 97 mmol/L — ABNORMAL LOW (ref 98–111)
Creatinine, Ser: 0.92 mg/dL (ref 0.61–1.24)
GFR, Estimated: >60 mL/min (ref >60)
Glucose, Bld: 96 mg/dL (ref 70–99)
Potassium: 4.4 mmol/L (ref 3.5–5.1)
Sodium: 132 mmol/L — ABNORMAL LOW (ref 135–145)

## 2020-11-02 LAB — CBC WITH DIFFERENTIAL/PLATELET
Abs Immature Granulocytes: 0.06 10*3/uL (ref 0.00–0.07)
Basophils Absolute: 0.1 10*3/uL (ref 0.0–0.1)
Basophils Relative: 1 %
Eosinophils Absolute: 0.6 10*3/uL — ABNORMAL HIGH (ref 0.0–0.5)
Eosinophils Relative: 8 %
HCT: 26.2 % — ABNORMAL LOW (ref 39.0–52.0)
Hemoglobin: 8.5 g/dL — ABNORMAL LOW (ref 13.0–17.0)
Immature Granulocytes: 1 %
Lymphocytes Relative: 16 %
Lymphs Abs: 1.3 10*3/uL (ref 0.7–4.0)
MCH: 30.7 pg (ref 26.0–34.0)
MCHC: 32.4 g/dL (ref 30.0–36.0)
MCV: 94.6 fL (ref 80.0–100.0)
Monocytes Absolute: 1.1 10*3/uL — ABNORMAL HIGH (ref 0.1–1.0)
Monocytes Relative: 13 %
Neutro Abs: 4.9 10*3/uL (ref 1.7–7.7)
Neutrophils Relative %: 61 %
Platelets: 393 10*3/uL (ref 150–400)
RBC: 2.77 MIL/uL — ABNORMAL LOW (ref 4.22–5.81)
RDW: 14.4 % (ref 11.5–15.5)
WBC: 8 10*3/uL (ref 4.0–10.5)
nRBC: 0 % (ref 0.0–0.2)

## 2020-11-02 LAB — MAGNESIUM: Magnesium: 1.9 mg/dL (ref 1.7–2.4)

## 2020-11-02 LAB — GLUCOSE, CAPILLARY
Glucose-Capillary: 103 mg/dL — ABNORMAL HIGH (ref 70–99)
Glucose-Capillary: 98 mg/dL (ref 70–99)
Glucose-Capillary: 99 mg/dL (ref 70–99)
Glucose-Capillary: 118 mg/dL — ABNORMAL HIGH (ref 70–99)
Glucose-Capillary: 135 mg/dL — ABNORMAL HIGH (ref 70–99)
Glucose-Capillary: 104 mg/dL — ABNORMAL HIGH (ref 70–99)

## 2020-11-02 LAB — PHOSPHORUS: Phosphorus: 6.4 mg/dL — ABNORMAL HIGH (ref 2.5–4.6)

## 2020-11-02 MED ORDER — FREE WATER
200.0000 mL | Freq: Four times a day (QID) | Status: DC
  Administered 2020-11-02 – 2020-11-06 (×14): 200 mL

## 2020-11-02 MED ORDER — OXYCODONE HCL 5 MG PO TABS
5.0000 mg | ORAL_TABLET | Freq: Four times a day (QID) | ORAL | Status: DC | PRN
  Administered 2020-11-02 – 2020-11-04 (×5): 5 mg
  Filled 2020-11-02 (×5): qty 1

## 2020-11-02 MED ORDER — TRAMADOL HCL 50 MG PO TABS: 50.0000 mg | ORAL_TABLET | Freq: Four times a day (QID) | ORAL | Status: DC | PRN

## 2020-11-02 MED ORDER — OXYCODONE HCL 5 MG PO TABS: 5.0000 mg | ORAL_TABLET | Freq: Four times a day (QID) | ORAL | Status: DC | PRN

## 2020-11-02 NOTE — Progress Notes (Signed)
Palliative Medicine Inpatient Follow Up Note   HPI:  70 y.o.malewith past medical history of HTN and ETOH abuseadmitted on 4/2/2022after being found down at home - had not been seen for 5 days.He was diagnosed with shock and received IV fluids and antibiotics. Did require CRRT d/t AKI. Renal function now improved but patient remains confused, refusing meds and PO intake. New fevers 4/10 and antibiotics restarted - concern for wound infection. PMT consulted to discuss Raymond Stone.  Today's Discussion (11/02/2020):  *Please note that this is a verbal dictation therefore any spelling or grammatical errors are due to the "[employer]" system interpretation.  I met with patient's daughter Raymond Stone at bedside.  We reviewed Raymond Stone's past medical history inclusive of his hypertension, congestive heart failure, kidney disease, and alcohol abuse syndrome.  We discussed his complicated 97-XYI hospital course which has been fraught with challenges in the setting of metabolic encephalopathy and persistent adult failure to thrive.  Reviewed patient's poor physical state and his immobility to move his extremities.  Reviewed the most recent CAT scan which shows erosive changes of the right C2 and C3.  We further discussed that Raymond Stone has had a calorie count done recently with taking no more than 3 spoon fills of nutrition and within 24 hours.  We reviewed that this is insufficient to sustain life and living in the long-term.  Conversation was had regarding CODE STATUS.  I shared with Raymond Stone that if Raymond Stone were to undergo a cardiac arrest and cardiopulmonary resuscitation his outcomes were likely to be very poor.  We further discussed again that long-term nutrition would likely need to be administered via PEG tube and that I wary even if this were started given all of Raymond Stone's other conditions he would continue to decline irregardless.  We discussed if PEG is not placed the option of pursuing comfort oriented care  and allowing Raymond Stone to pass with dignity and peace enabling thorough symptom control.  Raymond Stone shares with me that she and her husband had discussed the CODE STATUS and she herself is in favor of DO NOT RESUSCITATE.  The situation with her father gets a bit more complicated as he has 2 sisters who are actively involved in visiting with him and are of the belief that he is miraculously improving.  I shared with Raymond Stone that I worry they may be seeing the small positives and not the overall poor clinical picture which she agrees with.  I offered to have a more formalized meeting with Raymond Stone and Raymond Stone sisters and the oncoming days.  I shared with her the that this may be of value to both them and her as it would relinquish her from disseminating all of the healthcare information to the family.  At this point in time the plan is to continue the course of care with ongoing goals of care conversations.  Raymond Stone plans to call Raymond Stone daughter this evening to provide a formal medical update from her perspective.  Questions and concerns addressed   Objective Assessment: Vital Signs Vitals:   11/02/20 0754 11/02/20 1151  BP: 120/75 118/69  Pulse: (!) 111 (!) 113  Resp: 16 18  Temp: 98.1 F (36.7 C) 98.1 F (36.7 C)  SpO2: 96% 100%    Intake/Output Summary (Last 24 hours) at 11/02/2020 1431 Last data filed at 11/02/2020 0900 Gross per 24 hour  Intake --  Output 850 ml  Net -850 ml   Last Weight  Most recent update: 10/31/2020  5:21 AM  Weight  73 kg (161 lb)           Physical Exam Constitutional:  General: He is not in acute distress. Pulmonary:  Effort: Pulmonary effort is normal.  Skin: General: Skin is warmand dry.  Neurological:  Mental Status: He is oriented to person and place  SUMMARY OF RECOMMENDATIONS Full Code / Full Scope of Care - strongly recommended DO NOT RESUSCITATE CODE STATUS  Patient remains to have adult failure to thrive a calorie count was  trialed patient only had a few spoonfuls of food in a 24-hour period of time.  I recommended to patient's daughter consideration of comfort focused care given his lack of improvements over the past 20 days.  Plan to have another conversation with patient's daughter and 2 sisters to further discuss the next steps in patient care   Ongoing incremental PMT support  Time Spent: 60 Greater than 50% of the time was spent in counseling and coordination of care ______________________________________________________________________________________ [facility] Team Team Cell Phone: [phone] Please utilize secure chat with additional questions, if there is no response within 30 minutes please call the above phone number  Palliative Medicine Team providers are available by phone from 7am to 7pm daily and can be reached through the team cell phone.  Should this patient require assistance outside of these hours, please call the patient's attending physician.

## 2020-11-02 NOTE — Progress Notes (Signed)
PROGRESS NOTE  Raymond Stone [id number] DOB: 1951-05-29 DOA: 10/12/2020 PCP: Raymond Perna, NP  HPI/Recap of past 24 hours: Raymond Stone admitted to the hospital with working diagnosis hypovolemic shock complicated by acute kidney injury/ATN. 70 year old male past medical history of hypertension who was found down at his home, apparently he was not seen for about 5 days. When EMS arrived he was severely hypotensive, his temperature was 100 F he received intravenous fluids and transported to the emergency room.   Per his daughter Raymond Stone via phone on 11/01/2020 the patient was fully functional prior to this.  Was ambulating and was independent with all his ADLs.  Admission CT head was nonacute.  Sodium 172, potassium 7.3, chloride>130,bicarb 15, glucose 156, BUN 211, creatinine 11.2, AST 118, ALT 42, total bilirubin is 1.5, troponin I 489-395, lactic acid 5.3. anion gap 49. ABG, pH 7.37, PCO2 26, PO2 96, bicarb 15 White cell count 18.7, hemoglobin 11.1, hematocrit 37.6, platelets 346. SARS COVID-19 negative. Urinalysis specific gravity 1.026, negative protein, 21-50 white cells, 0-5 red cells. Toxicology negative. Head CT negative for acute changes.  Chest radiograph no infiltrates. EKG had 19 bpm, normal axis, normal intervals, sinus rhythm, no ST segment or T wave changes. Patient was diagnosed with shock, he received intravenous fluids and broad-spectrum antibiotic therapy. Patient underwent CRRT04/03with further improvement of his kidney function. Peak CK 2,415 mild rhabdomyolysis likely not the culprit of renal failure. Patient very debilitated.  CT abdomen and pelvis done on 10/23/2020 which showed no acute intra-abdominal pathology.  Due to ongoing poor oral intake, we discussed need for at least temporary tube feeding.  Patient was amenable.  Coretrak was placed on 10/25/2020 and tube feeds initiated, cortract was clamped on 11/01/2020 for calorie count.  On  10/31/2020 the patient was more alert and answering questions appropriately.  Repeat CT head and CT cervical spine done on 11/01/20 non acute.  CT cervical spine showed 1 erosive change of the dens.  Curb sided with neurosurgery [name].  Recommended pain control and follow-up outpatient.  11/02/20: Seen and examined at his bedside.  He is alert oriented x4.  Assessment/Plan: Active Problems:   Essential hypertension   AKI (acute kidney injury) ([facility])   Alcohol dependence (HCC)   Pressure injury of skin   Malnutrition of moderate degree   Hypernatremia   Hyperkalemia   Acute metabolic encephalopathy   ATN (acute tubular necrosis) (HCC)   Cerebral atrophy (HCC)   Failure to thrive in adult   Fever  Resolved acute metabolic encephalopathy  Resolved uremic encephalopathy Advanced cerebral atrophy Adult failure to thrive -metabolic derangements corrected since admission and he is technically oriented (although some worsening at times) but he is not thriving well at all; does not eat much and is so weak/deconditioned he may not have the physical reserve to survive this illness - for now, core track tube feeding to strengthen/re-condition as able; if fails, he would be more appropriate for hospice/comfort care and end of life discussions; Palliative care greatly assisting - s/p cortrak 4/15 and TF started on 10/25/20  Generalized weakness affecting upper extremities -CT head done on 11/01/2020, nonacute. CT cervical spine showing erosive changes of the dens and severe right C2-3 facet arthrosis Discussed with neurosurgery, [name] on 11/02/2020, recommended pain control and follow-up outpatient. Continue PT OT as tolerated and with assistance.  Erosive changes of the dens/severe right C2-3 facet arthrosis Curb sided with neurosurgery, [name] on 11/02/2020, recommended pain control and follow-up outpatient.  Subclinical hypothyroidism. TSH greater than 7, free T4 normal  0.90 Repeat TSH in 6 to 8 weeks or when acute illness has resolved  Moderatecalorie protein malnutrition Poor oral intake - Continue with nutritional supplements, multivitamins,and thiamine. -Continue dysphagia diet; not taking in much nutrition and at risk for further decline/complications; ongoing [place] with daughter; PCM assisting discussions - agree with trial of mirtazapine - Cortrak placed on 4/15 and TF initiated, clamped on 11/01/2020 for calorie counts Restarted core track tube feeding on 11/02/2020 as the patient had minimal oral intake.  Intermittently tachycardia suspect related to possible dehydration and fevers  MRSA screen negative Procalcitonin 0.30 Blood cultures negative to date. Personally reviewed chest x-ray done on 11/02/2020 no acute cardiopulmonary disease. Increased free water flushes to 200 cc every 6 hours.  Resolved post with patient: Hypomagnesemia Continue replacement via core track tube feeding.  Resolved hypovolemic shock with AKI/ATN Resolved hypernatremia - 172 on admission Resolved AGMA 2/2 renal failure - s/p volume resuscitation on admission followed by CRRT - renal function now normalized  Sepsis 2/2 wounds (not present on admission). Patient with complex wounds, left hemithorax and left thigh - s/p abx course x 5 days Continue local wound care Continue to improve nutritional status.  HTN. BP is currently at goal  -Continue Lopressor Continue to monitor vital signs.  Alcohol abuse - continue folic, thiamine, MVI - PRN benzo  Aspiration pneumonitis (present on admission)  Continue with aspiration precautions. O2 saturation 97% on room air.  Goals of care Bedbound, multiple decubitus ulcers, dysphagia requiring culture, minimally interactive, failure to thrive in an adult. Palliative care following, appreciate assistance. Currently the patient is full code.   Antimicrobials: Zosyn 4/3 - 4/4 Rocephin 4/10 >> 4/15 Vanc  4/10 >> 4/15  DVT prophylaxis: enoxaparin (LOVENOX) injection 40 mg Start: 10/17/20 1530 SCDs Start: 10/12/20 2213   Code Status:   Code Status: Full Code Family Communication:  Updated his daughter Raymond Stone via phone on 10/31/2020 and 11/01/2020.  Disposition Plan: Status is: Inpatient  Remains inpatient appropriate because:IV treatments appropriate due to intensity of illness or inability to take PO and Inpatient level of care appropriate due to severity of illness   Dispo: The patient is from: Home  Anticipated d/c is to: SNF  Patient currently is not medically stable to d/c.              Difficult to place patient No      Objective: Vitals:   11/02/20 0111 11/02/20 0510 11/02/20 0754 11/02/20 1151  BP: 108/68 137/73 120/75 118/69  Pulse:   (!) 111 (!) 113  Resp: 18 18 16 18   Temp: 98.7 F (37.1 C) 98.1 F (36.7 C) 98.1 F (36.7 C) 98.1 F (36.7 C)  TempSrc: Oral Oral Axillary Oral  SpO2: 98%  96% 100%  Weight:      Height:        Intake/Output Summary (Last 24 hours) at 11/02/2020 1318 Last data filed at 11/02/2020 0900 Gross per 24 hour  Intake --  Output 1350 ml  Net -1350 ml   Filed Weights   10/29/20 0605 10/30/20 0413 10/31/20 0419  Weight: 76.2 kg 74.4 kg 73 kg    Exam:  . General: 70 y.o. year-old male chronically ill-appearing no acute distress.  He is alert and oriented x3.   . Cardiovascular: Tachycardic with no rubs or gallops. Marland Kitchen Respiratory: Clear to auscultation no wheezes or rales. . Abdomen: Soft nontender normal bowel sounds present. . Musculoskeletal: No lower extremity  edema bilaterally.   . Skin: Diffuse pressure wounds in lower extremities bilaterally. Marland Kitchen Psychiatry: Mood is appropriate for condition and setting.   Data Reviewed: CBC: Recent Labs  Lab 10/29/20 0312 10/30/20 0304 10/30/20 1828 10/31/20 0310 11/01/20 0219 11/02/20 0300  WBC 10.9* 9.7  --  8.7 7.7 8.0  NEUTROABS 7.3 6.6  --  5.9 5.1  4.9  HGB 7.9* 7.7* 7.7* 7.9* 7.4* 8.5*  HCT 24.1* 24.0* 23.2* 23.7* 22.9* 26.2*  MCV 97.6 96.8  --  95.2 95.0 94.6  PLT 409* 379  --  356 342 315   Basic Metabolic Panel: Recent Labs  Lab 10/29/20 0312 10/30/20 0304 10/31/20 0310 11/01/20 0219 11/02/20 0300  NA 129* 131* 130* 130* 132*  K 4.6 4.6 4.7 4.4 4.4  CL 96* 98 97* 96* 97*  CO2 24 27 26 24 26   GLUCOSE 134* 123* 121* 132* 96  BUN 25* 26* 27* 29* 36*  CREATININE 0.91 0.81 0.80 0.82 0.92  CALCIUM 10.0 9.9 9.7 9.5 10.1  MG 1.6* 1.5* 1.8 1.7 1.9  PHOS 3.3 4.1 4.1 4.7* 6.4*   GFR: Estimated Creatinine Clearance: 75.8 mL/min (by C-G formula based on SCr of 0.92 mg/dL). Liver Function Tests: No results for input(s): AST, ALT, ALKPHOS, BILITOT, PROT, ALBUMIN in the last 168 hours. No results for input(s): LIPASE, AMYLASE in the last 168 hours. No results for input(s): AMMONIA in the last 168 hours. Coagulation Profile: No results for input(s): INR, PROTIME in the last 168 hours. Cardiac Enzymes: No results for input(s): CKTOTAL, CKMB, CKMBINDEX, TROPONINI in the last 168 hours. BNP (last 3 results) No results for input(s): PROBNP in the last 8760 hours. HbA1C: No results for input(s): HGBA1C in the last 72 hours. CBG: Recent Labs  Lab 11/01/20 2152 11/02/20 0106 11/02/20 0520 11/02/20 0758 11/02/20 1153  GLUCAP 95 103* 98 99 118*   Lipid Profile: No results for input(s): CHOL, HDL, LDLCALC, TRIG, CHOLHDL, LDLDIRECT in the last 72 hours. Thyroid Function Tests: Recent Labs    10/31/20 0310 11/01/20 0219  TSH 7.661*  --   FREET4  --  0.90   Anemia Panel: No results for input(s): VITAMINB12, FOLATE, FERRITIN, TIBC, IRON, RETICCTPCT in the last 72 hours. Urine analysis:    Component Value Date/Time   COLORURINE YELLOW 10/20/2020 0815   APPEARANCEUR HAZY (A) 10/20/2020 0815   LABSPEC 1.020 10/20/2020 0815   PHURINE 5.0 10/20/2020 0815   GLUCOSEU >=500 (A) 10/20/2020 0815   HGBUR SMALL (A) 10/20/2020 0815    BILIRUBINUR NEGATIVE 10/20/2020 0815   KETONESUR 5 (A) 10/20/2020 0815   PROTEINUR 30 (A) 10/20/2020 0815   NITRITE NEGATIVE 10/20/2020 0815   LEUKOCYTESUR NEGATIVE 10/20/2020 0815   Sepsis Labs: @LABRCNTIP (procalcitonin:4,lacticidven:4)  ) Recent Results (from the past 240 hour(s))  Culture, blood (routine x 2)     Status: None   Collection Time: 10/27/20  1:22 PM   Specimen: BLOOD  Result Value Ref Range Status   Specimen Description BLOOD RIGHT ANTECUBITAL  Final   Special Requests   Final    BOTTLES DRAWN AEROBIC ONLY Blood Culture adequate volume   Culture   Final    NO GROWTH 5 DAYS Performed at [facility], [place] [facility] [place] [address] [place], [place] [address]    Report Status 11/01/2020 FINAL  Final  Culture, blood (routine x 2)     Status: None   Collection Time: 10/27/20  1:22 PM   Specimen: BLOOD  Result Value Ref Range Status  Specimen Description BLOOD LEFT ANTECUBITAL  Final   Special Requests   Final    BOTTLES DRAWN AEROBIC ONLY Blood Culture adequate volume   Culture   Final    NO GROWTH 5 DAYS Performed at [facility], [address] [place], [place] [address]    Report Status 11/01/2020 FINAL  Final  Culture, blood (routine x 2)     Status: None (Preliminary result)   Collection Time: 11/01/20  6:10 PM   Specimen: BLOOD  Result Value Ref Range Status   Specimen Description BLOOD LEFT ANTECUBITAL  Final   Special Requests   Final    BOTTLES DRAWN AEROBIC AND ANAEROBIC Blood Culture adequate volume   Culture   Final    NO GROWTH < 12 HOURS Performed at [facility], [place] [address], [place], [place] [address]    Report Status PENDING  Incomplete  Culture, blood (routine x 2)     Status: None (Preliminary result)   Collection Time: 11/01/20  6:20 PM   Specimen: BLOOD LEFT HAND  Result Value Ref Range Status   Specimen Description BLOOD LEFT HAND  Final   Special Requests   Final    BOTTLES DRAWN AEROBIC AND ANAEROBIC Blood Culture  adequate volume   Culture   Final    NO GROWTH < 12 HOURS Performed at [facility], [place] [address], [place], [place] [address]    Report Status PENDING  Incomplete  MRSA PCR Screening     Status: None   Collection Time: 11/01/20  7:00 PM   Specimen: Nasal Mucosa; Nasopharyngeal  Result Value Ref Range Status   MRSA by PCR NEGATIVE NEGATIVE Final    Comment:        The GeneXpert MRSA Assay (FDA approved for NASAL specimens only), is one component of a comprehensive MRSA colonization surveillance program. It is not intended to diagnose MRSA infection nor to guide or monitor treatment for MRSA infections. Performed at [facility], [place] [address], [place], [place] [address]       Studies: CT HEAD WO CONTRAST  Result Date: 11/01/2020 CLINICAL DATA:  Generalized weakness EXAM: CT HEAD WITHOUT CONTRAST CT CERVICAL SPINE WITHOUT CONTRAST TECHNIQUE: Multidetector CT imaging of the head and cervical spine was performed following the standard protocol without intravenous contrast. Multiplanar CT image reconstructions of the cervical spine were also generated. COMPARISON:  None. FINDINGS: CT HEAD FINDINGS Brain: There is no mass, hemorrhage or extra-axial collection. The size and configuration of the ventricles and extra-axial CSF spaces are normal. The brain parenchyma is normal, without evidence of acute or chronic infarction. Vascular: No abnormal hyperdensity of the major intracranial arteries or dural venous sinuses. No intracranial atherosclerosis. Skull: The visualized skull base, calvarium and extracranial soft tissues are normal. Sinuses/Orbits: No fluid levels or advanced mucosal thickening of the visualized paranasal sinuses. No mastoid or middle ear effusion. The orbits are normal. CT CERVICAL SPINE FINDINGS Alignment: No static subluxation. Facets are aligned. Occipital condyles are normally positioned. Skull base and vertebrae: No acute fracture. Erosive change of the  dens. Soft tissues and spinal canal: No prevertebral fluid or swelling. No visible canal hematoma. Disc levels: Multilevel anterior osteophytosis. There is fusion of the left facets at C4-5 and fusion of the right facets at C3-4. There is erosive change of the dens. There is severe right C2-3 facet arthrosis. Upper chest: No pneumothorax, pulmonary nodule or pleural effusion. Other: Normal visualized paraspinal cervical soft tissues. IMPRESSION:  1. No acute intracranial abnormality. 2. No acute fracture or static subluxation of the cervical spine. 3. Erosive change of the dens, which may be a sequela of CPPD or rheumatoid arthritis. Electronically Signed   By: Ulyses Jarred M.D.   On: 11/01/2020 21:33   CT CERVICAL SPINE WO CONTRAST  Result Date: 11/01/2020 CLINICAL DATA:  Generalized weakness EXAM: CT HEAD WITHOUT CONTRAST CT CERVICAL SPINE WITHOUT CONTRAST TECHNIQUE: Multidetector CT imaging of the head and cervical spine was performed following the standard protocol without intravenous contrast. Multiplanar CT image reconstructions of the cervical spine were also generated. COMPARISON:  None. FINDINGS: CT HEAD FINDINGS Brain: There is no mass, hemorrhage or extra-axial collection. The size and configuration of the ventricles and extra-axial CSF spaces are normal. The brain parenchyma is normal, without evidence of acute or chronic infarction. Vascular: No abnormal hyperdensity of the major intracranial arteries or dural venous sinuses. No intracranial atherosclerosis. Skull: The visualized skull base, calvarium and extracranial soft tissues are normal. Sinuses/Orbits: No fluid levels or advanced mucosal thickening of the visualized paranasal sinuses. No mastoid or middle ear effusion. The orbits are normal. CT CERVICAL SPINE FINDINGS Alignment: No static subluxation. Facets are aligned. Occipital condyles are normally positioned. Skull base and vertebrae: No acute fracture. Erosive change of the dens. Soft  tissues and spinal canal: No prevertebral fluid or swelling. No visible canal hematoma. Disc levels: Multilevel anterior osteophytosis. There is fusion of the left facets at C4-5 and fusion of the right facets at C3-4. There is erosive change of the dens. There is severe right C2-3 facet arthrosis. Upper chest: No pneumothorax, pulmonary nodule or pleural effusion. Other: Normal visualized paraspinal cervical soft tissues. IMPRESSION: 1. No acute intracranial abnormality. 2. No acute fracture or static subluxation of the cervical spine. 3. Erosive change of the dens, which may be a sequela of CPPD or rheumatoid arthritis. Electronically Signed   By: Ulyses Jarred M.D.   On: 11/01/2020 21:33   DG CHEST PORT 1 VIEW  Result Date: 11/02/2020 CLINICAL DATA:  Feeding tube placement EXAM: PORTABLE CHEST 1 VIEW COMPARISON:  11/02/2020 FINDINGS: Feeding tube tip is well below the level of the GE junction. Heart size normal. No pleural effusion or edema. No airspace densities. The heart size and mediastinal contours are within normal limits. Both lungs are clear. The visualized skeletal structures are unremarkable. IMPRESSION: No active cardiopulmonary abnormalities. Electronically Signed   By: Kerby Moors M.D.   On: 11/02/2020 09:41   DG Abd Portable 1V  Result Date: 11/02/2020 CLINICAL DATA:  Evaluate feeding tube. EXAM: PORTABLE ABDOMEN - 1 VIEW COMPARISON:  10/27/2020 FINDINGS: There is a feeding tube with tip projecting over the expected location of the duodenal bulb/pylorus. This is unchanged from previous exam. No dilated loops of large or small bowel. Gas is seen within the colon up to the rectum. IMPRESSION: Stable position of feeding tube with tip in the duodenal bulb/pylorus. Electronically Signed   By: Kerby Moors M.D.   On: 11/02/2020 09:44    Scheduled Meds: . collagenase   Topical Daily  . enoxaparin (LOVENOX) injection  40 mg Subcutaneous Q24H  . feeding supplement  237 mL Oral TID WC & HS   . feeding supplement (PROSource TF)  45 mL Per Tube BID  . folic acid  1 mg Per Tube Daily  . free water  100 mL Per Tube Q6H  . magnesium oxide  400 mg Per Tube BID  . mouth rinse  15 mL Mouth  Rinse BID  . metoprolol tartrate  25 mg Per Tube BID  . mirtazapine  15 mg Oral QHS  . multivitamin  15 mL Per Tube Daily  . sennosides  5 mL Per Tube QHS  . thiamine  100 mg Per Tube Daily    Continuous Infusions: . sodium chloride 1,000 mL (10/21/20 1514)  . feeding supplement (OSMOLITE 1.5 CAL) 1,000 mL (11/02/20 0853)     LOS: 21 days     Kayleen Memos, MD [facility] Pager [phone]  If 7PM-7AM, please contact night-coverage [email or web address] Password [email or web address] 11/02/2020, 1:18 PM

## 2020-11-03 ENCOUNTER — Inpatient Hospital Stay (HOSPITAL_COMMUNITY): Payer: Medicare Other

## 2020-11-03 DIAGNOSIS — R509 Fever, unspecified: Secondary | ICD-10-CM

## 2020-11-03 DIAGNOSIS — T17908S Unspecified foreign body in respiratory tract, part unspecified causing other injury, sequela: Secondary | ICD-10-CM

## 2020-11-03 DIAGNOSIS — I10 Essential (primary) hypertension: Secondary | ICD-10-CM

## 2020-11-03 DIAGNOSIS — R7989 Other specified abnormal findings of blood chemistry: Secondary | ICD-10-CM

## 2020-11-03 LAB — COMPREHENSIVE METABOLIC PANEL
ALT: 31 U/L (ref 0–44)
AST: 35 U/L (ref 15–41)
Albumin: 1.6 g/dL — ABNORMAL LOW (ref 3.5–5.0)
Alkaline Phosphatase: 126 U/L (ref 38–126)
Anion gap: 10 (ref 5–15)
BUN: 35 mg/dL — ABNORMAL HIGH (ref 8–23)
CO2: 21 mmol/L — ABNORMAL LOW (ref 22–32)
Calcium: 9.9 mg/dL (ref 8.9–10.3)
Chloride: 101 mmol/L (ref 98–111)
Creatinine, Ser: 0.89 mg/dL (ref 0.61–1.24)
GFR, Estimated: >60 mL/min (ref >60)
Glucose, Bld: 138 mg/dL — ABNORMAL HIGH (ref 70–99)
Potassium: 5.1 mmol/L (ref 3.5–5.1)
Sodium: 132 mmol/L — ABNORMAL LOW (ref 135–145)
Total Bilirubin: 0.4 mg/dL (ref 0.3–1.2)
Total Protein: 7.1 g/dL (ref 6.5–8.1)

## 2020-11-03 LAB — CBC WITH DIFFERENTIAL/PLATELET
Abs Immature Granulocytes: 0.06 10*3/uL (ref 0.00–0.07)
Basophils Absolute: 0 10*3/uL (ref 0.0–0.1)
Basophils Relative: 0 %
Eosinophils Absolute: 0.5 10*3/uL (ref 0.0–0.5)
Eosinophils Relative: 6 %
HCT: 21.9 % — ABNORMAL LOW (ref 39.0–52.0)
Hemoglobin: 7 g/dL — ABNORMAL LOW (ref 13.0–17.0)
Immature Granulocytes: 1 %
Lymphocytes Relative: 13 %
Lymphs Abs: 1.2 10*3/uL (ref 0.7–4.0)
MCH: 30.7 pg (ref 26.0–34.0)
MCHC: 32 g/dL (ref 30.0–36.0)
MCV: 96.1 fL (ref 80.0–100.0)
Monocytes Absolute: 1.3 10*3/uL — ABNORMAL HIGH (ref 0.1–1.0)
Monocytes Relative: 14 %
Neutro Abs: 6.1 10*3/uL (ref 1.7–7.7)
Neutrophils Relative %: 66 %
RBC: 2.28 MIL/uL — ABNORMAL LOW (ref 4.22–5.81)
RDW: 14.6 % (ref 11.5–15.5)
WBC: 9.1 10*3/uL (ref 4.0–10.5)
nRBC: 0 % (ref 0.0–0.2)

## 2020-11-03 LAB — GLUCOSE, CAPILLARY
Glucose-Capillary: 109 mg/dL — ABNORMAL HIGH (ref 70–99)
Glucose-Capillary: 135 mg/dL — ABNORMAL HIGH (ref 70–99)
Glucose-Capillary: 147 mg/dL — ABNORMAL HIGH (ref 70–99)
Glucose-Capillary: 149 mg/dL — ABNORMAL HIGH (ref 70–99)
Glucose-Capillary: 159 mg/dL — ABNORMAL HIGH (ref 70–99)

## 2020-11-03 LAB — ECHOCARDIOGRAM LIMITED
Height: 69 in
S' Lateral: 2.2 cm
Weight: 2576 oz

## 2020-11-03 LAB — PHOSPHORUS: Phosphorus: 4 mg/dL (ref 2.5–4.6)

## 2020-11-03 LAB — PROCALCITONIN: Procalcitonin: 0.33 ng/mL

## 2020-11-03 LAB — BRAIN NATRIURETIC PEPTIDE: B Natriuretic Peptide: 33.2 pg/mL (ref 0.0–100.0)

## 2020-11-03 LAB — MAGNESIUM: Magnesium: 1.8 mg/dL (ref 1.7–2.4)

## 2020-11-03 NOTE — Consult Note (Signed)
[facility] for Infectious Disease    Date of Admission:  10/12/2020     Reason for Consult: fevers     Referring Physician: Dr Nevada Crane  Current antibiotics: none    ASSESSMENT & PLAN:    Unclear etiology as to patient's recurrent fevers, however, would be most suspicious for possible chronic aspiration causing fevers although chest x-ray unremarkable and patient with no significant respiratory complaints and saturating on room air.  Would also consider his multiple wounds to be a potential source and he is currently receiving wound care.  Transthoracic echo did not reveal a vegetation and he has had multiple sets of blood cultures this admission negative which is reassuring.  His dentition is poor so will obtain orthopantogram to look for a potential odontogenic source of fevers as well.  Given his overall stability would recommend continuing to hold his antibiotics at this time.  Dr Gale Journey or [place] will be back Monday.   Active Problems:   Essential hypertension   AKI (acute kidney injury) ([place])   Alcohol dependence ([place])   Pressure injury of skin   Malnutrition of moderate degree   Hypernatremia   Hyperkalemia   Acute metabolic encephalopathy   ATN (acute tubular necrosis) ([facility])   Cerebral atrophy ([facility])   Failure to thrive in adult   Fever   MEDICATIONS:    Scheduled Meds: . collagenase   Topical Daily  . enoxaparin (LOVENOX) injection  40 mg Subcutaneous Q24H  . feeding supplement  237 mL Oral TID WC & HS  . feeding supplement (PROSource TF)  45 mL Per Tube BID  . folic acid  1 mg Per Tube Daily  . free water  200 mL Per Tube Q6H  . magnesium oxide  400 mg Per Tube BID  . mouth rinse  15 mL Mouth Rinse BID  . metoprolol tartrate  25 mg Per Tube BID  . mirtazapine  15 mg Oral QHS  . multivitamin  15 mL Per Tube Daily  . sennosides  5 mL Per Tube QHS  . thiamine  100 mg Per Tube Daily   Continuous Infusions: . sodium chloride 1,000 mL (10/21/20 1514)  .  feeding supplement (OSMOLITE 1.5 CAL) 1,000 mL (11/02/20 0853)   PRN Meds:.sodium chloride, acetaminophen (TYLENOL) oral liquid 160 mg/5 mL, lip balm, LORazepam, ondansetron (ZOFRAN) IV, oxyCODONE, polyethylene glycol, polyvinyl alcohol, traMADol  HPI:    Raymond Stone is a 70 y.o. male with a past medical history of hypertension, CHF, kidney disease, alcohol use disorder who has had a complicated 74-[id number] admission to this hospital.  He was admitted 10/12/2020 after being found down at home and was diagnosed with hypovolemic shock.  He received IV fluids and empiric antibiotic coverage.  He briefly required CRRT due to acute kidney injury but renal function has now improved.  His hospital course has been complicated by encephalopathy and failure to thrive for which palliative care has been consulted.  He has been noted to have fevers throughout the course of his hospitalization without clear etiology however there was some concern for wound infection as a potential source vs aspiration pneumonitis.  He received a course of piperacillin tazobactam from 4/2 through 4/6 with subsequent downtrend of his fevers and overall improvement in his leukocytosis.  He had a one-time fever of 101.1 F on 10/18/2020 however was not restarted on antibiotics at that time.  Fevers persisted the following day and continued through 10/22/2020.  He was restarted on ceftriaxone and vancomycin  empirically on 4/10 with downtrend in his fevers at that time as well as improvement in his leukocytosis again.  Antibiotics were again stopped on 4/14 and have been held since that time.  He has continued to spike fevers however from 4/17 through 4/22.  His leukocytosis has continued to downtrend and has been normalized as of 4/19.  There has been no etiology of his fevers uncovered thus far.  Work-up has included negative sets of blood cultures on 4/2, 4/10, 4/17, and 4/22.  Imaging studies includes TTE done today which did not note any valvular  vegetations.  Chest x-ray yesterday was unremarkable.  CT head and cervical spine 4/22 without acute findings other than erosive changes of the dens which may be a sequelae of CPPD or rheumatoid arthritis.  This was discussed with neurosurgery who recommended pain control and outpatient follow-up.  CT abdomen and pelvis with contrast 4/13 with no acute intra-abdominal pathology.  Urinalysis at time of febrile episode was unremarkable for infection.  Patient has no localizing complaints when seen this morning.  He has no indwelling central lines and only peripheral IV access.  He has a NG tube in place.  He has multiple pressure injuries documented.  He remains at risk for aspiration and is currently being followed by SLP who has recommended his diet be dysphagia to and thin liquids.   Past Medical History:  Diagnosis Date  . Anemia   . Eczema   . Fluid collection (edema) in the arms, legs, hands and feet   . Hypertension     Social History   Tobacco Use  . Smoking status: Never Smoker  . Smokeless tobacco: Never Used  Vaping Use  . Vaping Use: Never used  Substance Use Topics  . Alcohol use: Yes    Alcohol/week: 3.0 - 4.0 standard drinks    Types: 3 - 4 Cans of beer per week    Comment: beer everyday  . Drug use: No    Family History  Problem Relation Age of Onset  . Lupus Daughter   . Stomach cancer Brother   . Colon cancer Neg Hx   . Colon polyps Neg Hx   . Esophageal cancer Neg Hx   . Rectal cancer Neg Hx     Allergies  Allergen Reactions  . Lisinopril Swelling    ANGIOEDEMA    Review of Systems  Constitutional: Negative for chills and fever.  Respiratory: Negative.   Cardiovascular: Negative.   Gastrointestinal: Negative.   Genitourinary: Negative.   Musculoskeletal: Positive for neck pain.  Skin: Negative for rash.       + wounds  All other systems reviewed and are negative.   OBJECTIVE:   Blood pressure (!) 135/56, pulse (!) 112, temperature 98.9 F (37.2  C), temperature source Axillary, resp. rate 20, height 5\' 9"  (1.753 m), weight 73 kg, SpO2 100 %. Body mass index is 23.78 kg/m.  Physical Exam Constitutional:      Comments: Chronically ill-appearing man lying in bed no acute distress  HENT:     Head: Normocephalic and atraumatic.     Comments: NG tube in place    Mouth/Throat:     Comments: Dentition is poor Eyes:     Extraocular Movements: Extraocular movements intact.     Conjunctiva/sclera: Conjunctivae normal.  Cardiovascular:     Rate and Rhythm: Normal rate and regular rhythm.     Heart sounds: No murmur heard.   Pulmonary:     Effort: Pulmonary effort is  normal. No respiratory distress.     Comments: Breath sounds diminished at the bases Abdominal:     Palpations: Abdomen is soft.     Tenderness: There is no abdominal tenderness. There is no guarding or rebound.  Musculoskeletal:     Right lower leg: No edema.     Left lower leg: No edema.  Skin:    General: Skin is warm and dry.  Neurological:     General: No focal deficit present.     Mental Status: Mental status is at baseline.  Psychiatric:        Mood and Affect: Mood normal.        Behavior: Behavior normal.      Lab Results: Lab Results  Component Value Date   WBC 9.1 11/03/2020   HGB 7.0 (L) 11/03/2020   HCT 21.9 (L) 11/03/2020   MCV 96.1 11/03/2020   PLT 393 11/02/2020    Lab Results  Component Value Date   NA 132 (L) 11/03/2020   K 5.1 11/03/2020   CO2 21 (L) 11/03/2020   GLUCOSE 138 (H) 11/03/2020   BUN 35 (H) 11/03/2020   CREATININE 0.89 11/03/2020   CALCIUM 9.9 11/03/2020   GFRNONAA >60 11/03/2020   GFRAA 76 01/23/2020    Lab Results  Component Value Date   ALT 31 11/03/2020   AST 35 11/03/2020   ALKPHOS 126 11/03/2020   BILITOT 0.4 11/03/2020       Component Value Date/Time   CRP 6.9 (H) 01/25/2017 0525       Component Value Date/Time   ESRSEDRATE 54 (H) 01/25/2017 0525    I have reviewed the micro and lab results  in Epic.  Imaging: CT HEAD WO CONTRAST  Result Date: 11/01/2020 CLINICAL DATA:  Generalized weakness EXAM: CT HEAD WITHOUT CONTRAST CT CERVICAL SPINE WITHOUT CONTRAST TECHNIQUE: Multidetector CT imaging of the head and cervical spine was performed following the standard protocol without intravenous contrast. Multiplanar CT image reconstructions of the cervical spine were also generated. COMPARISON:  None. FINDINGS: CT HEAD FINDINGS Brain: There is no mass, hemorrhage or extra-axial collection. The size and configuration of the ventricles and extra-axial CSF spaces are normal. The brain parenchyma is normal, without evidence of acute or chronic infarction. Vascular: No abnormal hyperdensity of the major intracranial arteries or dural venous sinuses. No intracranial atherosclerosis. Skull: The visualized skull base, calvarium and extracranial soft tissues are normal. Sinuses/Orbits: No fluid levels or advanced mucosal thickening of the visualized paranasal sinuses. No mastoid or middle ear effusion. The orbits are normal. CT CERVICAL SPINE FINDINGS Alignment: No static subluxation. Facets are aligned. Occipital condyles are normally positioned. Skull base and vertebrae: No acute fracture. Erosive change of the dens. Soft tissues and spinal canal: No prevertebral fluid or swelling. No visible canal hematoma. Disc levels: Multilevel anterior osteophytosis. There is fusion of the left facets at C4-5 and fusion of the right facets at C3-4. There is erosive change of the dens. There is severe right C2-3 facet arthrosis. Upper chest: No pneumothorax, pulmonary nodule or pleural effusion. Other: Normal visualized paraspinal cervical soft tissues. IMPRESSION: 1. No acute intracranial abnormality. 2. No acute fracture or static subluxation of the cervical spine. 3. Erosive change of the dens, which may be a sequela of CPPD or rheumatoid arthritis. Electronically Signed   By: Deatra Robinson M.D.   On: 11/01/2020 21:33    CT CERVICAL SPINE WO CONTRAST  Result Date: 11/01/2020 CLINICAL DATA:  Generalized weakness EXAM: CT HEAD WITHOUT CONTRAST  CT CERVICAL SPINE WITHOUT CONTRAST TECHNIQUE: Multidetector CT imaging of the head and cervical spine was performed following the standard protocol without intravenous contrast. Multiplanar CT image reconstructions of the cervical spine were also generated. COMPARISON:  None. FINDINGS: CT HEAD FINDINGS Brain: There is no mass, hemorrhage or extra-axial collection. The size and configuration of the ventricles and extra-axial CSF spaces are normal. The brain parenchyma is normal, without evidence of acute or chronic infarction. Vascular: No abnormal hyperdensity of the major intracranial arteries or dural venous sinuses. No intracranial atherosclerosis. Skull: The visualized skull base, calvarium and extracranial soft tissues are normal. Sinuses/Orbits: No fluid levels or advanced mucosal thickening of the visualized paranasal sinuses. No mastoid or middle ear effusion. The orbits are normal. CT CERVICAL SPINE FINDINGS Alignment: No static subluxation. Facets are aligned. Occipital condyles are normally positioned. Skull base and vertebrae: No acute fracture. Erosive change of the dens. Soft tissues and spinal canal: No prevertebral fluid or swelling. No visible canal hematoma. Disc levels: Multilevel anterior osteophytosis. There is fusion of the left facets at C4-5 and fusion of the right facets at C3-4. There is erosive change of the dens. There is severe right C2-3 facet arthrosis. Upper chest: No pneumothorax, pulmonary nodule or pleural effusion. Other: Normal visualized paraspinal cervical soft tissues. IMPRESSION: 1. No acute intracranial abnormality. 2. No acute fracture or static subluxation of the cervical spine. 3. Erosive change of the dens, which may be a sequela of CPPD or rheumatoid arthritis. Electronically Signed   By: Ulyses Jarred M.D.   On: 11/01/2020 21:33   DG CHEST  PORT 1 VIEW  Result Date: 11/02/2020 CLINICAL DATA:  Feeding tube placement EXAM: PORTABLE CHEST 1 VIEW COMPARISON:  11/02/2020 FINDINGS: Feeding tube tip is well below the level of the GE junction. Heart size normal. No pleural effusion or edema. No airspace densities. The heart size and mediastinal contours are within normal limits. Both lungs are clear. The visualized skeletal structures are unremarkable. IMPRESSION: No active cardiopulmonary abnormalities. Electronically Signed   By: Kerby Moors M.D.   On: 11/02/2020 09:41   DG Abd Portable 1V  Result Date: 11/02/2020 CLINICAL DATA:  Evaluate feeding tube. EXAM: PORTABLE ABDOMEN - 1 VIEW COMPARISON:  10/27/2020 FINDINGS: There is a feeding tube with tip projecting over the expected location of the duodenal bulb/pylorus. This is unchanged from previous exam. No dilated loops of large or small bowel. Gas is seen within the colon up to the rectum. IMPRESSION: Stable position of feeding tube with tip in the duodenal bulb/pylorus. Electronically Signed   By: Kerby Moors M.D.   On: 11/02/2020 09:44   ECHOCARDIOGRAM LIMITED  Result Date: 11/03/2020    ECHOCARDIOGRAM LIMITED REPORT   Patient Name:   Raymond Stone Date of Exam: 11/03/2020 Medical Rec #:  [id number]      Height:       69.0 in Accession #:    [id number]     Weight:       161.0 lb Date of Birth:  02/03/1951       BSA:          1.884 m Patient Age:    70 years       BP:           97/58 mmHg Patient Gender: M              HR:           122 bpm. Exam Location:  Inpatient Procedure: 2D Echo, Cardiac Doppler  and Color Doppler Indications:    Elevated Troponin  History:        Patient has prior history of Echocardiogram examinations, most                 recent 01/28/2016. CHF; Risk Factors:Hypertension.  Sonographer:    Cammy Brochure Referring Phys: [id number] Kayleen Memos  Sonographer Comments: Patient and wound dressing covering his apical window and did not have IV access for definity  IMPRESSIONS  1. Left ventricular ejection fraction, by estimation, is 60 to 65%. The left ventricle has normal function. The left ventricle has no regional wall motion abnormalities. There is mild left ventricular hypertrophy.  2. Right ventricular systolic function was not well visualized. The right ventricular size is not well visualized.  3. The mitral valve is normal in structure. No evidence of mitral valve regurgitation. No evidence of mitral stenosis.  4. The aortic valve is tricuspid. Aortic valve regurgitation is not visualized. No aortic stenosis is present. FINDINGS  Left Ventricle: Left ventricular ejection fraction, by estimation, is 60 to 65%. The left ventricle has normal function. The left ventricle has no regional wall motion abnormalities. The left ventricular internal cavity size was normal in size. There is  mild left ventricular hypertrophy. Right Ventricle: The right ventricular size is not well visualized. Right vetricular wall thickness was not assessed. Right ventricular systolic function was not well visualized. Left Atrium: Left atrial size was normal in size. Right Atrium: Right atrial size was normal in size. Pericardium: Trivial pericardial effusion is present. Mitral Valve: The mitral valve is normal in structure. No evidence of mitral valve stenosis. Tricuspid Valve: The tricuspid valve is normal in structure. Tricuspid valve regurgitation is trivial. No evidence of tricuspid stenosis. Aortic Valve: The aortic valve is tricuspid. Aortic valve regurgitation is not visualized. No aortic stenosis is present. Pulmonic Valve: The pulmonic valve was normal in structure. Pulmonic valve regurgitation is not visualized. No evidence of pulmonic stenosis. Aorta: The aortic root is normal in size and structure. Venous: The inferior vena cava was not well visualized.  Additional Comments: Technically difficult; limited images obtained (no apical views). LEFT VENTRICLE PLAX 2D LVIDd:         3.80  cm LVIDs:         2.20 cm LV PW:         1.30 cm LV IVS:        1.30 cm LVOT diam:     2.10 cm LVOT Area:     3.46 cm  IVC IVC diam: 0.70 cm  AORTA Ao Root diam: 2.90 cm  SHUNTS Systemic Diam: 2.10 cm Kirk Ruths MD Electronically signed by Kirk Ruths MD Signature Date/Time: 11/03/2020/11:13:11 AM    Final      Imaging independently reviewed in Epic.  [facility]ious Disease [facility] [phone] pager 11/03/2020, 11:50 AM

## 2020-11-03 NOTE — Progress Notes (Signed)
   Palliative Medicine Inpatient Follow Up Note  HPI:  70 y.o.malewith past medical history of HTN and ETOH abuseadmitted on 4/2/2022after being found down at home - had not been seen for 5 days.He was diagnosed with shock and received IV fluids and antibiotics. Did require CRRT d/t AKI. Renal function now improved but patient remains confused, refusing meds and PO intake. New fevers 4/10 and antibiotics restarted - concern for wound infection. PMT consulted to discuss [place].  Today's Discussion (11/03/2020):  *Please note that this is a verbal dictation therefore any spelling or grammatical errors are due to the "[place]" system interpretation.  I met with Raymond Stone this morning. He was noted to be in no distress. He was oriented to person and unable to tell me where he was. He remains to incrementally think he is in [place]. Reoriented.   Spoke to patients daughter, Raymond Stone this afternoon. She shares with me that she spoke to Pershing's sisters who are in agreement with a family meeting on Wednesday. Planned time for 10AM. I shared with Raymond Stone that I will not be present though I will request one of my colleagues follows up with her.   Questions and concerns addressed   Objective Assessment: Vital Signs Vitals:   11/03/20 0812 11/03/20 1144  BP: (!) 135/56   Pulse: (!) 131 (!) 112  Resp: 20   Temp: 100 F (37.8 C) 98.9 F (37.2 C)  SpO2:  100%    Intake/Output Summary (Last 24 hours) at 11/03/2020 1504 Last data filed at 11/03/2020 1145 Gross per 24 hour  Intake 1695 ml  Output 800 ml  Net 895 ml   Last Weight  Most recent update: 10/31/2020  5:21 AM   Weight  73 kg (161 lb)           Physical Exam Constitutional:  General: He is not in acute distress. Pulmonary:  Effort: Pulmonary effort is normal.  Skin: General: Skin is warmand dry.  Neurological:  Mental Status: He is oriented to person   SUMMARY OF RECOMMENDATIONS Full Code / Full Scope  of Care - strongly recommended DO NOT RESUSCITATE CODE STATUS  Patient remains to have adult failure to thrive a calorie count was trialed patient only had a few spoonfuls of food over the first 24-hour period of time. Remains in the last 24 hours to have 0% intake.    I recommended to patient's daughter consideration of comfort focused care given his lack of improvements over the past 21 days, while she feels this is a reasonable next step she worries that patients sisters will oppose her and is hopeful they can all be in agreement with the plan moving forward  Plan for family meeting on Wednesday at 1400  Ongoing incremental PMT support  I will not be present on Wednesday though I will request my colleague follows up. Dr. Virgilio Belling will be attending as the primary hospitalist.    Time Spent:  25 Greater than 50% of the time was spent in counseling and coordination of care ______________________________________________________________________________________ [facility] Team Team Cell Phone: [phone] Please utilize secure chat with additional questions, if there is no response within 30 minutes please call the above phone number  Palliative Medicine Team providers are available by phone from 7am to 7pm daily and can be reached through the team cell phone.  Should this patient require assistance outside of these hours, please call the patient's attending physician.

## 2020-11-03 NOTE — Progress Notes (Addendum)
PROGRESS NOTE  Raymond Stone [id number] DOB: [birth year] DOA: 10/12/2020 PCP: [name], NP  HPI/Recap of past 24 hours: Raymond Stone admitted to the hospital with working diagnosis hypovolemic shock complicated by acute kidney injury/ATN. 70 year old male past medical history of hypertension who was found down at his home, apparently he was not seen for about 5 days. When EMS arrived he was severely hypotensive, his temperature was 100 F he received intravenous fluids and transported to the emergency room.   Per his daughter Raymond Stone via phone on 11/01/2020 the patient was fully functional prior to this.  Was ambulating and was independent with all his ADLs.  Admission CT head was nonacute.  Sodium 172, potassium 7.3, chloride>130,bicarb 15, glucose 156, BUN 211, creatinine 11.2, AST 118, ALT 42, total bilirubin is 1.5, troponin I 489-395, lactic acid 5.3. anion gap 49. ABG, pH 7.37, PCO2 26, PO2 96, bicarb 15 White cell count 18.7, hemoglobin 11.1, hematocrit 37.6, platelets 346. SARS COVID-19 negative. Urinalysis specific gravity 1.026, negative protein, 21-50 white cells, 0-5 red cells. Toxicology negative. Head CT negative for acute changes.  Chest radiograph no infiltrates. EKG had 19 bpm, normal axis, normal intervals, sinus rhythm, no ST segment or T wave changes. Patient was diagnosed with shock, he received intravenous fluids and broad-spectrum antibiotic therapy. Patient underwent CRRT04/03with further improvement of his kidney function. Peak CK 2,415 mild rhabdomyolysis likely not the culprit of renal failure. Patient very debilitated.  CT abdomen and pelvis done on 10/23/2020 which showed no acute intra-abdominal pathology.  Due to ongoing poor oral intake, we discussed need for at least temporary tube feeding.  Patient was amenable.  Coretrak was placed on 10/25/2020 and tube feeds initiated, cortract was clamped on 11/01/2020 for calorie count.  On  10/31/2020 the patient was more alert and answering questions appropriately.  Repeat CT head and CT cervical spine done on 11/01/20 non acute.  CT cervical spine showed 1 erosive change of the dens.  Curb sided with neurosurgery [name].  Recommended pain control and follow-up outpatient.  11/03/20: Seen and examined at his bedside this morning.  He has no new issues or concerns.  He has had intermittent fevers for which infectious disease was consulted.  Recommended to hold antibiotics at this time given his overall stability.  Assessment/Plan: Active Problems:   Essential hypertension   AKI (acute kidney injury) ([place])   Alcohol dependence (HCC)   Pressure injury of skin   Malnutrition of moderate degree   Hypernatremia   Hyperkalemia   Acute metabolic encephalopathy   ATN (acute tubular necrosis) (HCC)   Cerebral atrophy (HCC)   Failure to thrive in adult   Fever  Resolved acute metabolic encephalopathy  Resolved uremic encephalopathy Advanced cerebral atrophy Adult failure to thrive -metabolic derangements corrected since admission and he is technically oriented (although some worsening at times) but he is not thriving well at all; does not eat much and is so weak/deconditioned he may not have the physical reserve to survive this illness - for now, core track tube feeding to strengthen/re-condition as able; if fails, he would be more appropriate for hospice/comfort care and end of life discussions; Palliative care greatly assisting - s/p cortrak 4/15 and TF started on 10/25/20  Generalized weakness affecting upper extremities -CT head done on 11/01/2020, nonacute. CT cervical spine showing erosive changes of the dens and severe right C2-3 facet arthrosis Discussed with neurosurgery, [name] on 11/02/2020, recommended pain control and follow-up outpatient. Continue PT OT as tolerated  and with assistance.  Recurrent fevers and tachycardia Discussed with ID Possibly  multifactorial, possibly associated with wounds versus others ID recommended to continue to hold off antibiotics due to overall clinical stability.  Erosive changes of the dens/severe right C2-3 facet arthrosis Curb sided with neurosurgery, [name] on 11/02/2020, recommended pain control and follow-up outpatient.  Subclinical hypothyroidism. TSH greater than 7, free T4 normal 0.90 Repeat TSH in 6 to 8 weeks or when acute illness has resolved  Moderatecalorie protein malnutrition Poor oral intake - Continue with nutritional supplements, multivitamins,and thiamine. -Continue dysphagia diet; not taking in much nutrition and at risk for further decline/complications; ongoing [place] with daughter; PCM assisting discussions - agree with trial of mirtazapine - Cortrak placed on 4/15 and TF initiated, clamped on 11/01/2020 for calorie counts Restarted core track tube feeding on 11/02/2020 as the patient had minimal oral intake.  Intermittently tachycardia suspect related to possible dehydration and fevers  MRSA screen negative Procalcitonin 0.30 Blood cultures negative to date. Personally reviewed chest x-ray done on 11/02/2020 no acute cardiopulmonary disease. Increased free water flushes to 200 cc every 6 hours.  Resolved post with patient: Hypomagnesemia Continue replacement via core track tube feeding.  Resolved hypovolemic shock with AKI/ATN Resolved hypernatremia - 172 on admission Resolved AGMA 2/2 renal failure - s/p volume resuscitation on admission followed by CRRT - renal function now normalized  Sepsis 2/2 wounds (not present on admission). Patient with complex wounds, left hemithorax and left thigh - s/p abx course x 5 days Continue local wound care Continue to improve nutritional status.  HTN. BP is currently at goal  -Continue Lopressor Continue to monitor vital signs.  Alcohol abuse - continue folic, thiamine, MVI - PRN benzo  Aspiration pneumonitis  (present on admission)  Continue with aspiration precautions. O2 saturation 97% on room air.  Goals of care Bedbound, multiple decubitus ulcers, dysphagia requiring culture, minimally interactive, failure to thrive in an adult. Palliative care following, appreciate assistance. Currently the patient is full code. Goals of care discussion with family and attending plan for Wednesday, 11/06/2020.   Antimicrobials: Zosyn 4/3 - 4/4 Rocephin 4/10 >> 4/15 Vanc 4/10 >> 4/15  DVT prophylaxis: enoxaparin (LOVENOX) injection 40 mg Start: 10/17/20 1530 SCDs Start: 10/12/20 2213   Code Status:   Code Status: Full Code Family Communication:  Updated his daughter Raymond Stone via phone on 10/31/2020 and 11/01/2020.  Disposition Plan: Status is: Inpatient  Remains inpatient appropriate because:IV treatments appropriate due to intensity of illness or inability to take PO and Inpatient level of care appropriate due to severity of illness   Dispo: The patient is from: Home  Anticipated d/c is to: SNF  Patient currently is not medically stable to d/c.              Difficult to place patient No      Objective: Vitals:   11/03/20 0019 11/03/20 0419 11/03/20 0812 11/03/20 1144  BP: 116/70 (!) 97/58 (!) 135/56   Pulse: (!) 106 (!) 122 (!) 131 (!) 112  Resp: 18 18 20    Temp: 100.2 F (37.9 C) 99.1 F (37.3 C) 100 F (37.8 C) 98.9 F (37.2 C)  TempSrc: Axillary Axillary Axillary Axillary  SpO2: 100% 100%  100%  Weight:      Height:        Intake/Output Summary (Last 24 hours) at 11/03/2020 1526 Last data filed at 11/03/2020 1145 Gross per 24 hour  Intake 1695 ml  Output 800 ml  Net 895 ml  Filed Weights   10/29/20 0605 10/30/20 0413 10/31/20 0419  Weight: 76.2 kg 74.4 kg 73 kg    Exam:  . General: 70 y.o. year-old male pleasant chronically ill-appearing no acute distress.  He is alert and noted x3.   . Cardiovascular: Tachycardic with no rubs or  gallops.  No JVD noted. Marland Kitchen Respiratory: Clear to auscultation no wheezes or rales.   . Abdomen: Soft nontender no bowel sounds present.   . Musculoskeletal: No lower extremity edema bilaterally. . Skin: Diffuse pressure wounds in lower extremities bilaterally.   Marland Kitchen Psychiatry: Mood is appropriate for condition and setting.   Data Reviewed: CBC: Recent Labs  Lab 10/29/20 0312 10/30/20 0304 10/30/20 1828 10/31/20 0310 11/01/20 0219 11/02/20 0300 11/03/20 0659  WBC 10.9* 9.7  --  8.7 7.7 8.0 9.1  NEUTROABS 7.3 6.6  --  5.9 5.1 4.9 6.1  HGB 7.9* 7.7* 7.7* 7.9* 7.4* 8.5* 7.0*  HCT 24.1* 24.0* 23.2* 23.7* 22.9* 26.2* 21.9*  MCV 97.6 96.8  --  95.2 95.0 94.6 96.1  PLT 409* 379  --  356 342 393  --    Basic Metabolic Panel: Recent Labs  Lab 10/30/20 0304 10/31/20 0310 11/01/20 0219 11/02/20 0300 11/03/20 0659  NA 131* 130* 130* 132* 132*  K 4.6 4.7 4.4 4.4 5.1  CL 98 97* 96* 97* 101  CO2 27 26 24 26  21*  GLUCOSE 123* 121* 132* 96 138*  BUN 26* 27* 29* 36* 35*  CREATININE 0.81 0.80 0.82 0.92 0.89  CALCIUM 9.9 9.7 9.5 10.1 9.9  MG 1.5* 1.8 1.7 1.9 1.8  PHOS 4.1 4.1 4.7* 6.4* 4.0   GFR: Estimated Creatinine Clearance: 78.3 mL/min (by C-G formula based on SCr of 0.89 mg/dL). Liver Function Tests: Recent Labs  Lab 11/03/20 0659  AST 35  ALT 31  ALKPHOS 126  BILITOT 0.4  PROT 7.1  ALBUMIN 1.6*   No results for input(s): LIPASE, AMYLASE in the last 168 hours. No results for input(s): AMMONIA in the last 168 hours. Coagulation Profile: No results for input(s): INR, PROTIME in the last 168 hours. Cardiac Enzymes: No results for input(s): CKTOTAL, CKMB, CKMBINDEX, TROPONINI in the last 168 hours. BNP (last 3 results) No results for input(s): PROBNP in the last 8760 hours. HbA1C: No results for input(s): HGBA1C in the last 72 hours. CBG: Recent Labs  Lab 11/02/20 1612 11/02/20 2021 11/03/20 0018 11/03/20 0425 11/03/20 0835  GLUCAP 135* 104* 135* 147* 149*    Lipid Profile: No results for input(s): CHOL, HDL, LDLCALC, TRIG, CHOLHDL, LDLDIRECT in the last 72 hours. Thyroid Function Tests: Recent Labs    11/01/20 0219  FREET4 0.90   Anemia Panel: No results for input(s): VITAMINB12, FOLATE, FERRITIN, TIBC, IRON, RETICCTPCT in the last 72 hours. Urine analysis:    Component Value Date/Time   COLORURINE YELLOW 10/20/2020 0815   APPEARANCEUR HAZY (A) 10/20/2020 0815   LABSPEC 1.020 10/20/2020 0815   PHURINE 5.0 10/20/2020 0815   GLUCOSEU >=500 (A) 10/20/2020 0815   HGBUR SMALL (A) 10/20/2020 0815   BILIRUBINUR NEGATIVE 10/20/2020 0815   KETONESUR 5 (A) 10/20/2020 0815   PROTEINUR 30 (A) 10/20/2020 0815   NITRITE NEGATIVE 10/20/2020 0815   LEUKOCYTESUR NEGATIVE 10/20/2020 0815   Sepsis Labs: @LABRCNTIP (procalcitonin:4,lacticidven:4)  ) Recent Results (from the past 240 hour(s))  Culture, blood (routine x 2)     Status: None   Collection Time: 10/27/20  1:22 PM   Specimen: BLOOD  Result Value Ref Range Status  Specimen Description BLOOD RIGHT ANTECUBITAL  Final   Special Requests   Final    BOTTLES DRAWN AEROBIC ONLY Blood Culture adequate volume   Culture   Final    NO GROWTH 5 DAYS Performed at [facility], [address] [place], [place] [address]    Report Status 11/01/2020 FINAL  Final  Culture, blood (routine x 2)     Status: None   Collection Time: 10/27/20  1:22 PM   Specimen: BLOOD  Result Value Ref Range Status   Specimen Description BLOOD LEFT ANTECUBITAL  Final   Special Requests   Final    BOTTLES DRAWN AEROBIC ONLY Blood Culture adequate volume   Culture   Final    NO GROWTH 5 DAYS Performed at [facility], [place] [address], [place], [place] [address]    Report Status 11/01/2020 FINAL  Final  Culture, blood (routine x 2)     Status: None (Preliminary result)   Collection Time: 11/01/20  6:10 PM   Specimen: BLOOD  Result Value Ref Range Status   Specimen Description BLOOD LEFT ANTECUBITAL   Final   Special Requests   Final    BOTTLES DRAWN AEROBIC AND ANAEROBIC Blood Culture adequate volume   Culture   Final    NO GROWTH 2 DAYS Performed at [facility], [place] [address], [place], [place] [address]    Report Status PENDING  Incomplete  Culture, blood (routine x 2)     Status: None (Preliminary result)   Collection Time: 11/01/20  6:20 PM   Specimen: BLOOD LEFT HAND  Result Value Ref Range Status   Specimen Description BLOOD LEFT HAND  Final   Special Requests   Final    BOTTLES DRAWN AEROBIC AND ANAEROBIC Blood Culture adequate volume   Culture   Final    NO GROWTH 2 DAYS Performed at [facility], [place] [address], [place], [place] [address]    Report Status PENDING  Incomplete  MRSA PCR Screening     Status: None   Collection Time: 11/01/20  7:00 PM   Specimen: Nasal Mucosa; Nasopharyngeal  Result Value Ref Range Status   MRSA by PCR NEGATIVE NEGATIVE Final    Comment:        The GeneXpert MRSA Assay (FDA approved for NASAL specimens only), is one component of a comprehensive MRSA colonization surveillance program. It is not intended to diagnose MRSA infection nor to guide or monitor treatment for MRSA infections. Performed at [facility], [place] [address] [place], [place] [address]       Studies: ECHOCARDIOGRAM LIMITED  Result Date: 11/03/2020    ECHOCARDIOGRAM LIMITED REPORT   Patient Name:   CEPHAS REVARD Date of Exam: 11/03/2020 Medical Rec #:  [id number]      Height:       69.0 in Accession #:    [id number]     Weight:       161.0 lb Date of Birth:  Nov 16, 1950       BSA:          1.884 m Patient Age:    77 years       BP:           97/58 mmHg Patient Gender: M              HR:           122 bpm. Exam Location:  Inpatient Procedure: 2D Echo, Cardiac Doppler and Color  Doppler Indications:    Elevated Troponin  History:        Patient has prior history of Echocardiogram examinations, most                 recent 01/28/2016. CHF; Risk  Factors:Hypertension.  Sonographer:    Cammy Brochure Referring Phys: [id number] Kayleen Memos  Sonographer Comments: Patient and wound dressing covering his apical window and did not have IV access for definity IMPRESSIONS  1. Left ventricular ejection fraction, by estimation, is 60 to 65%. The left ventricle has normal function. The left ventricle has no regional wall motion abnormalities. There is mild left ventricular hypertrophy.  2. Right ventricular systolic function was not well visualized. The right ventricular size is not well visualized.  3. The mitral valve is normal in structure. No evidence of mitral valve regurgitation. No evidence of mitral stenosis.  4. The aortic valve is tricuspid. Aortic valve regurgitation is not visualized. No aortic stenosis is present. FINDINGS  Left Ventricle: Left ventricular ejection fraction, by estimation, is 60 to 65%. The left ventricle has normal function. The left ventricle has no regional wall motion abnormalities. The left ventricular internal cavity size was normal in size. There is  mild left ventricular hypertrophy. Right Ventricle: The right ventricular size is not well visualized. Right vetricular wall thickness was not assessed. Right ventricular systolic function was not well visualized. Left Atrium: Left atrial size was normal in size. Right Atrium: Right atrial size was normal in size. Pericardium: Trivial pericardial effusion is present. Mitral Valve: The mitral valve is normal in structure. No evidence of mitral valve stenosis. Tricuspid Valve: The tricuspid valve is normal in structure. Tricuspid valve regurgitation is trivial. No evidence of tricuspid stenosis. Aortic Valve: The aortic valve is tricuspid. Aortic valve regurgitation is not visualized. No aortic stenosis is present. Pulmonic Valve: The pulmonic valve was normal in structure. Pulmonic valve regurgitation is not visualized. No evidence of pulmonic stenosis. Aorta: The aortic root is normal  in size and structure. Venous: The inferior vena cava was not well visualized.  Additional Comments: Technically difficult; limited images obtained (no apical views). LEFT VENTRICLE PLAX 2D LVIDd:         3.80 cm LVIDs:         2.20 cm LV PW:         1.30 cm LV IVS:        1.30 cm LVOT diam:     2.10 cm LVOT Area:     3.46 cm  IVC IVC diam: 0.70 cm  AORTA Ao Root diam: 2.90 cm  SHUNTS Systemic Diam: 2.10 cm Kirk Ruths MD Electronically signed by Kirk Ruths MD Signature Date/Time: 11/03/2020/11:13:11 AM    Final     Scheduled Meds: . collagenase   Topical Daily  . enoxaparin (LOVENOX) injection  40 mg Subcutaneous Q24H  . feeding supplement  237 mL Oral TID WC & HS  . feeding supplement (PROSource TF)  45 mL Per Tube BID  . folic acid  1 mg Per Tube Daily  . free water  200 mL Per Tube Q6H  . magnesium oxide  400 mg Per Tube BID  . mouth rinse  15 mL Mouth Rinse BID  . metoprolol tartrate  25 mg Per Tube BID  . mirtazapine  15 mg Oral QHS  . multivitamin  15 mL Per Tube Daily  . sennosides  5 mL Per Tube QHS  . thiamine  100 mg Per Tube Daily  Continuous Infusions: . sodium chloride 1,000 mL (10/21/20 1514)  . feeding supplement (OSMOLITE 1.5 CAL) 1,000 mL (11/02/20 0853)     LOS: 22 days     Kayleen Memos, MD [facility] Pager [phone]  If 7PM-7AM, please contact night-coverage [email or web address] Password [facility] 11/03/2020, 3:26 PM

## 2020-11-03 NOTE — Progress Notes (Incomplete)
  Echocardiogram 2D Echocardiogram has been performed.  Cammy Brochure 11/03/2020, 9:39 AM

## 2020-11-04 ENCOUNTER — Other Ambulatory Visit (INDEPENDENT_AMBULATORY_CARE_PROVIDER_SITE_OTHER): Payer: Self-pay | Admitting: Primary Care

## 2020-11-04 DIAGNOSIS — I10 Essential (primary) hypertension: Secondary | ICD-10-CM

## 2020-11-04 DIAGNOSIS — Z8739 Personal history of other diseases of the musculoskeletal system and connective tissue: Secondary | ICD-10-CM

## 2020-11-04 DIAGNOSIS — M2550 Pain in unspecified joint: Secondary | ICD-10-CM

## 2020-11-04 LAB — CBC
HCT: 23.1 % — ABNORMAL LOW (ref 39.0–52.0)
Hemoglobin: 7.5 g/dL — ABNORMAL LOW (ref 13.0–17.0)
MCH: 30.5 pg (ref 26.0–34.0)
MCHC: 32.5 g/dL (ref 30.0–36.0)
MCV: 93.9 fL (ref 80.0–100.0)
Platelets: 531 10*3/uL — ABNORMAL HIGH (ref 150–400)
RBC: 2.46 MIL/uL — ABNORMAL LOW (ref 4.22–5.81)
RDW: 14.2 % (ref 11.5–15.5)
WBC: 8.9 10*3/uL (ref 4.0–10.5)
nRBC: 0 % (ref 0.0–0.2)

## 2020-11-04 LAB — GLUCOSE, CAPILLARY
Glucose-Capillary: 114 mg/dL — ABNORMAL HIGH (ref 70–99)
Glucose-Capillary: 115 mg/dL — ABNORMAL HIGH (ref 70–99)
Glucose-Capillary: 126 mg/dL — ABNORMAL HIGH (ref 70–99)
Glucose-Capillary: 126 mg/dL — ABNORMAL HIGH (ref 70–99)
Glucose-Capillary: 127 mg/dL — ABNORMAL HIGH (ref 70–99)
Glucose-Capillary: 131 mg/dL — ABNORMAL HIGH (ref 70–99)
Glucose-Capillary: 138 mg/dL — ABNORMAL HIGH (ref 70–99)

## 2020-11-04 LAB — BASIC METABOLIC PANEL
Anion gap: 8 (ref 5–15)
BUN: 28 mg/dL — ABNORMAL HIGH (ref 8–23)
CO2: 26 mmol/L (ref 22–32)
Calcium: 10 mg/dL (ref 8.9–10.3)
Chloride: 97 mmol/L — ABNORMAL LOW (ref 98–111)
Creatinine, Ser: 0.7 mg/dL (ref 0.61–1.24)
GFR, Estimated: >60 mL/min (ref >60)
Glucose, Bld: 130 mg/dL — ABNORMAL HIGH (ref 70–99)
Potassium: 4.5 mmol/L (ref 3.5–5.1)
Sodium: 131 mmol/L — ABNORMAL LOW (ref 135–145)

## 2020-11-04 LAB — HIV ANTIBODY (ROUTINE TESTING W REFLEX): HIV Screen 4th Generation wRfx: NONREACTIVE

## 2020-11-04 MED ORDER — METHYLPREDNISOLONE ACETATE 40 MG/ML IJ SUSP
40.0000 mg | Freq: Once | INTRAMUSCULAR | Status: AC
  Administered 2020-11-05: 40 mg via INTRA_ARTICULAR
  Filled 2020-11-04 (×2): qty 1

## 2020-11-04 MED ORDER — COLCHICINE 0.6 MG PO TABS
0.6000 mg | ORAL_TABLET | Freq: Two times a day (BID) | ORAL | Status: DC
  Administered 2020-11-04 – 2020-11-07 (×6): 0.6 mg via ORAL
  Filled 2020-11-04 (×6): qty 1

## 2020-11-04 MED ORDER — BUPIVACAINE HCL (PF) 0.5 % IJ SOLN
10.0000 mL | Freq: Once | INTRAMUSCULAR | Status: AC
  Administered 2020-11-05: 10 mL
  Filled 2020-11-04: qty 10

## 2020-11-04 NOTE — Consult Note (Signed)
[place] Nurse wound follow up PT Rolinda Roan reported she is very pleased with the way all the wounds are looking, and recommends to continue the current plan of care. No changes needed at this time. Val Riles, RN, MSN, CWOCN, CNS-BC, pager [phone]

## 2020-11-04 NOTE — Consult Note (Signed)
Reason for Consult:Left knee effusion Referring Physician: Dr. Gale Stone Time called: [id number] Time at bedside: [name] is an 70 y.o. male.  HPI: Raymond Stone has been hospitalized for 3 weeks after being found down. He has improved but continues to have intermittent fevers for which ID was consulted. Evaluation this morning noted a knee effusion and orthopedic surgery was consulted for aspiration though septic arthritis is low on the differential as this point. Pt notes knee has been hurting and swollen for weeks. He has a history of gout and had just gotten done treating an acute flare before he came into the hospital.  Past Medical History:  Diagnosis Date  . Anemia   . Eczema   . Fluid collection (edema) in the arms, legs, hands and feet   . Hypertension     Past Surgical History:  Procedure Laterality Date  . COLONOSCOPY    . FINGER SURGERY Right 2013  . I & D EXTREMITY Right 09/13/2012   Procedure: IRRIGATION AND DEBRIDEMENT EXTREMITY  RIGHT MIDDLE FINGER WITH REVISION AMPUTATION AND SKIN GRAFTING.;  Surgeon: Roseanne Kaufman, MD;  Location: [facility];  Service: Orthopedics;  Laterality: Right;    Family History  Problem Relation Age of Onset  . Lupus Daughter   . Stomach cancer Brother   . Colon cancer Neg Hx   . Colon polyps Neg Hx   . Esophageal cancer Neg Hx   . Rectal cancer Neg Hx     Social History:  reports that he has never smoked. He has never used smokeless tobacco. He reports current alcohol use of about 3.0 - 4.0 standard drinks of alcohol per week. He reports that he does not use drugs.  Allergies:  Allergies  Allergen Reactions  . Lisinopril Swelling    ANGIOEDEMA    Medications: I have reviewed the patient's current medications.  Results for orders placed or performed during the hospital encounter of 10/12/20 (from the past 48 hour(s))  Glucose, capillary     Status: Abnormal   Collection Time: 11/02/20  4:12 PM  Result Value Ref Range   Glucose-Capillary  135 (H) 70 - 99 mg/dL    Comment: Glucose reference range applies only to samples taken after fasting for at least 8 hours.  Glucose, capillary     Status: Abnormal   Collection Time: 11/02/20  8:21 PM  Result Value Ref Range   Glucose-Capillary 104 (H) 70 - 99 mg/dL    Comment: Glucose reference range applies only to samples taken after fasting for at least 8 hours.  Glucose, capillary     Status: Abnormal   Collection Time: 11/03/20 12:18 AM  Result Value Ref Range   Glucose-Capillary 135 (H) 70 - 99 mg/dL    Comment: Glucose reference range applies only to samples taken after fasting for at least 8 hours.  Glucose, capillary     Status: Abnormal   Collection Time: 11/03/20  4:25 AM  Result Value Ref Range   Glucose-Capillary 147 (H) 70 - 99 mg/dL    Comment: Glucose reference range applies only to samples taken after fasting for at least 8 hours.  CBC with Differential/Platelet     Status: Abnormal   Collection Time: 11/03/20  6:59 AM  Result Value Ref Range   WBC 9.1 4.0 - 10.5 K/uL   RBC 2.28 (L) 4.22 - 5.81 MIL/uL   Hemoglobin 7.0 (L) 13.0 - 17.0 g/dL   HCT 21.9 (L) 39.0 - 52.0 %   MCV 96.1 80.0 -  100.0 fL   MCH 30.7 26.0 - 34.0 pg   MCHC 32.0 30.0 - 36.0 g/dL   RDW 14.6 11.5 - 15.5 %   nRBC 0.0 0.0 - 0.2 %   Neutrophils Relative % 66 %   Neutro Abs 6.1 1.7 - 7.7 K/uL   Lymphocytes Relative 13 %   Lymphs Abs 1.2 0.7 - 4.0 K/uL   Monocytes Relative 14 %   Monocytes Absolute 1.3 (H) 0.1 - 1.0 K/uL   Eosinophils Relative 6 %   Eosinophils Absolute 0.5 0.0 - 0.5 K/uL   Basophils Relative 0 %   Basophils Absolute 0.0 0.0 - 0.1 K/uL   Immature Granulocytes 1 %   Abs Immature Granulocytes 0.06 0.00 - 0.07 K/uL    Comment: Performed at [facility] [address], [place], [place] [address]  Comprehensive metabolic panel     Status: Abnormal   Collection Time: 11/03/20  6:59 AM  Result Value Ref Range   Sodium 132 (L) 135 - 145 mmol/L   Potassium 5.1 3.5 - 5.1  mmol/L   Chloride 101 98 - 111 mmol/L   CO2 21 (L) 22 - 32 mmol/L   Glucose, Bld 138 (H) 70 - 99 mg/dL    Comment: Glucose reference range applies only to samples taken after fasting for at least 8 hours.   BUN 35 (H) 8 - 23 mg/dL   Creatinine, Ser 0.89 0.61 - 1.24 mg/dL   Calcium 9.9 8.9 - 10.3 mg/dL   Total Protein 7.1 6.5 - 8.1 g/dL   Albumin 1.6 (L) 3.5 - 5.0 g/dL   AST 35 15 - 41 U/L   ALT 31 0 - 44 U/L   Alkaline Phosphatase 126 38 - 126 U/L   Total Bilirubin 0.4 0.3 - 1.2 mg/dL   GFR, Estimated >60 >60 mL/min    Comment: (NOTE) Calculated using the CKD-EPI Creatinine Equation (2021)    Anion gap 10 5 - 15    Comment: Performed at [facility] [address] [place], [place] [address]  Magnesium     Status: None   Collection Time: 11/03/20  6:59 AM  Result Value Ref Range   Magnesium 1.8 1.7 - 2.4 mg/dL    Comment: Performed at [facility] [address] [place], [place] [address]  Phosphorus     Status: None   Collection Time: 11/03/20  6:59 AM  Result Value Ref Range   Phosphorus 4.0 2.5 - 4.6 mg/dL    Comment: Performed at [facility] [address] [place], [place] [address]  Procalcitonin - Baseline     Status: None   Collection Time: 11/03/20  6:59 AM  Result Value Ref Range   Procalcitonin 0.33 ng/mL    Comment:        Interpretation: PCT (Procalcitonin) <= 0.5 ng/mL: Systemic infection (sepsis) is not likely. Local bacterial infection is possible. (NOTE)       Sepsis PCT Algorithm           Lower Respiratory Tract                                      Infection PCT Algorithm    ----------------------------     ----------------------------         PCT < 0.25 ng/mL  PCT < 0.10 ng/mL          Strongly encourage             Strongly discourage   discontinuation of antibiotics    initiation of antibiotics    ----------------------------     -----------------------------       PCT 0.25 - 0.50 ng/mL            PCT 0.10 -  0.25 ng/mL               OR       >80% decrease in PCT            Discourage initiation of                                            antibiotics      Encourage discontinuation           of antibiotics    ----------------------------     -----------------------------         PCT >= 0.50 ng/mL              PCT 0.26 - 0.50 ng/mL               AND        <80% decrease in PCT             Encourage initiation of                                             antibiotics       Encourage continuation           of antibiotics    ----------------------------     -----------------------------        PCT >= 0.50 ng/mL                  PCT > 0.50 ng/mL               AND         increase in PCT                  Strongly encourage                                      initiation of antibiotics    Strongly encourage escalation           of antibiotics                                     -----------------------------                                           PCT <= 0.25 ng/mL                                                 OR                                        >  80% decrease in PCT                                      Discontinue / Do not initiate                                             antibiotics  Performed at [facility], [place] [address], [place], [place] [address]   Brain natriuretic peptide     Status: None   Collection Time: 11/03/20  6:59 AM  Result Value Ref Range   B Natriuretic Peptide 33.2 0.0 - 100.0 pg/mL    Comment: Performed at [facility] [address] [place], [place] [address]  Glucose, capillary     Status: Abnormal   Collection Time: 11/03/20  8:35 AM  Result Value Ref Range   Glucose-Capillary 149 (H) 70 - 99 mg/dL    Comment: Glucose reference range applies only to samples taken after fasting for at least 8 hours.  Glucose, capillary     Status: Abnormal   Collection Time: 11/03/20  4:12 PM  Result Value Ref Range   Glucose-Capillary 159 (H) 70 -  99 mg/dL    Comment: Glucose reference range applies only to samples taken after fasting for at least 8 hours.  Glucose, capillary     Status: Abnormal   Collection Time: 11/03/20  8:14 PM  Result Value Ref Range   Glucose-Capillary 109 (H) 70 - 99 mg/dL    Comment: Glucose reference range applies only to samples taken after fasting for at least 8 hours.  Glucose, capillary     Status: Abnormal   Collection Time: 11/04/20 12:16 AM  Result Value Ref Range   Glucose-Capillary 126 (H) 70 - 99 mg/dL    Comment: Glucose reference range applies only to samples taken after fasting for at least 8 hours.  Glucose, capillary     Status: Abnormal   Collection Time: 11/04/20  4:25 AM  Result Value Ref Range   Glucose-Capillary 138 (H) 70 - 99 mg/dL    Comment: Glucose reference range applies only to samples taken after fasting for at least 8 hours.  Basic metabolic panel     Status: Abnormal   Collection Time: 11/04/20  8:09 AM  Result Value Ref Range   Sodium 131 (L) 135 - 145 mmol/L   Potassium 4.5 3.5 - 5.1 mmol/L   Chloride 97 (L) 98 - 111 mmol/L   CO2 26 22 - 32 mmol/L   Glucose, Bld 130 (H) 70 - 99 mg/dL    Comment: Glucose reference range applies only to samples taken after fasting for at least 8 hours.   BUN 28 (H) 8 - 23 mg/dL   Creatinine, Ser 0.70 0.61 - 1.24 mg/dL   Calcium 10.0 8.9 - 10.3 mg/dL   GFR, Estimated >60 >60 mL/min    Comment: (NOTE) Calculated using the CKD-EPI Creatinine Equation (2021)    Anion gap 8 5 - 15    Comment: Performed at [facility] [address] [place] [address]  CBC     Status: Abnormal   Collection Time: 11/04/20  8:09 AM  Result Value Ref Range   WBC 8.9 4.0 - 10.5 K/uL   RBC 2.46 (L) 4.22 - 5.81  MIL/uL   Hemoglobin 7.5 (L) 13.0 - 17.0 g/dL   HCT 23.1 (L) 39.0 - 52.0 %   MCV 93.9 80.0 - 100.0 fL   MCH 30.5 26.0 - 34.0 pg   MCHC 32.5 30.0 - 36.0 g/dL   RDW 14.2 11.5 - 15.5 %   Platelets 531 (H) 150 - 400 K/uL   nRBC 0.0  0.0 - 0.2 %    Comment: Performed at [facility] [address] [place], [place] [address]  Glucose, capillary     Status: Abnormal   Collection Time: 11/04/20  8:44 AM  Result Value Ref Range   Glucose-Capillary 115 (H) 70 - 99 mg/dL    Comment: Glucose reference range applies only to samples taken after fasting for at least 8 hours.  Glucose, capillary     Status: Abnormal   Collection Time: 11/04/20 12:10 PM  Result Value Ref Range   Glucose-Capillary 126 (H) 70 - 99 mg/dL    Comment: Glucose reference range applies only to samples taken after fasting for at least 8 hours.    ECHOCARDIOGRAM LIMITED  Result Date: 11/03/2020    ECHOCARDIOGRAM LIMITED REPORT   Patient Name:   Raymond Stone Date of Exam: 11/03/2020 Medical Rec #:  [id number]      Height:       69.0 in Accession #:    [id number]     Weight:       161.0 lb Date of Birth:  [birth year]       BSA:          1.884 m Patient Age:    37 years       BP:           97/58 mmHg Patient Gender: M              HR:           122 bpm. Exam Location:  Inpatient Procedure: 2D Echo, Cardiac Doppler and Color Doppler Indications:    Elevated Troponin  History:        Patient has prior history of Echocardiogram examinations, most                 recent 01/28/2016. CHF; Risk Factors:Hypertension.  Sonographer:    Cammy Brochure Referring Phys: [id number] Kayleen Memos  Sonographer Comments: Patient and wound dressing covering his apical window and did not have IV access for definity IMPRESSIONS  1. Left ventricular ejection fraction, by estimation, is 60 to 65%. The left ventricle has normal function. The left ventricle has no regional wall motion abnormalities. There is mild left ventricular hypertrophy.  2. Right ventricular systolic function was not well visualized. The right ventricular size is not well visualized.  3. The mitral valve is normal in structure. No evidence of mitral valve regurgitation. No evidence of mitral stenosis.  4. The aortic  valve is tricuspid. Aortic valve regurgitation is not visualized. No aortic stenosis is present. FINDINGS  Left Ventricle: Left ventricular ejection fraction, by estimation, is 60 to 65%. The left ventricle has normal function. The left ventricle has no regional wall motion abnormalities. The left ventricular internal cavity size was normal in size. There is  mild left ventricular hypertrophy. Right Ventricle: The right ventricular size is not well visualized. Right vetricular wall thickness was not assessed. Right ventricular systolic function was not well visualized. Left Atrium: Left atrial size was normal in size. Right Atrium: Right atrial size was normal in size. Pericardium: Trivial pericardial  effusion is present. Mitral Valve: The mitral valve is normal in structure. No evidence of mitral valve stenosis. Tricuspid Valve: The tricuspid valve is normal in structure. Tricuspid valve regurgitation is trivial. No evidence of tricuspid stenosis. Aortic Valve: The aortic valve is tricuspid. Aortic valve regurgitation is not visualized. No aortic stenosis is present. Pulmonic Valve: The pulmonic valve was normal in structure. Pulmonic valve regurgitation is not visualized. No evidence of pulmonic stenosis. Aorta: The aortic root is normal in size and structure. Venous: The inferior vena cava was not well visualized.  Additional Comments: Technically difficult; limited images obtained (no apical views). LEFT VENTRICLE PLAX 2D LVIDd:         3.80 cm LVIDs:         2.20 cm LV PW:         1.30 cm LV IVS:        1.30 cm LVOT diam:     2.10 cm LVOT Area:     3.46 cm  IVC IVC diam: 0.70 cm  AORTA Ao Root diam: 2.90 cm  SHUNTS Systemic Diam: 2.10 cm Kirk Ruths MD Electronically signed by Kirk Ruths MD Signature Date/Time: 11/03/2020/11:13:11 AM    Final     Review of Systems  Constitutional: Negative for chills, diaphoresis and fever.  HENT: Negative for ear discharge, ear pain, hearing loss and tinnitus.    Eyes: Negative for photophobia and pain.  Respiratory: Negative for cough and shortness of breath.   Cardiovascular: Negative for chest pain.  Gastrointestinal: Negative for abdominal pain, nausea and vomiting.  Genitourinary: Negative for dysuria, flank pain, frequency and urgency.  Musculoskeletal: Positive for arthralgias (Left knee). Negative for back pain, myalgias and neck pain.  Neurological: Negative for dizziness and headaches.  Hematological: Does not bruise/bleed easily.  Psychiatric/Behavioral: The patient is not nervous/anxious.    Blood pressure 116/70, pulse (!) 108, temperature 100.2 F (37.9 C), temperature source Axillary, resp. rate 18, height 5\' 9"  (1.753 m), weight 68 kg, SpO2 100 %. Physical Exam Constitutional:      General: He is not in acute distress.    Appearance: He is well-developed. He is not diaphoretic.  HENT:     Head: Normocephalic and atraumatic.  Eyes:     General: No scleral icterus.       Right eye: No discharge.        Left eye: No discharge.     Conjunctiva/sclera: Conjunctivae normal.  Cardiovascular:     Rate and Rhythm: Regular rhythm. Tachycardia present.  Pulmonary:     Effort: Pulmonary effort is normal. No respiratory distress.  Musculoskeletal:     Cervical back: Normal range of motion.     Comments: LLE No traumatic wounds, ecchymosis, or rash  Mild TTP  Mod knee effusion  Knee stable to varus/ valgus and anterior/posterior stress  Sens DPN, SPN, TN intact  Motor EHL, ext, flex, evers 5/5  DP 2+, PT 1+, No significant edema  Skin:    General: Skin is warm and dry.  Neurological:     Mental Status: He is alert.  Psychiatric:        Behavior: Behavior normal.     Assessment/Plan: Left knee effusion -- Will perform arthrocentesis for fluid analysis. Pt in agreement. May be tomorrow morning depending on medication and consent timing.    Lisette Abu, PA-C Orthopedic Surgery [phone] 11/04/2020, 2:47 PM

## 2020-11-04 NOTE — Progress Notes (Signed)
[facility] for Infectious Disease  Date of Admission:  10/12/2020     CC: fever  Lines: Peripheral iv's  Abx: 04/10-04/15 vanc 04/10-04/15 ceftriaxone  04/02-04/06 vanc 04/02-04/06 piptazo  ASSESSMENT: Fever Polyarthralgia History of gout Sirs/sepsis   70 yo male alcoholic, hx gout, htn, admitted 4/02 with sepsis after found down on the floor for 5 days not being seen course complicated by hypovolumic shock (on initial admission), aki brief CRRT requirement (ck <2500). He has had intermittent high fever this admission of unclear etiology, and despite bsAbx. Initially with leukocytosis but subsequent febrile episodes is without leukocytosis.   Previous w/u including bcx over several days, tte, cxr, ct abd pelv (no contrast), all unremarkable. He has had no indwelling central lines. Last blood cx 4/17 and 4/22 negative Initial admission hepatitis serology negative  Current thought on initial id evaluation potential aspiration pneumonitis. He has had ngt for feeding/dysphagia  Today reported tenderness initially left knee when I tried to look for rash -- subsequently determitned to be polyarthritis/arthralgia of the left knee, right shoulder, bilateral elbows/ankles. He does have hx gout. I query polyarticular gout.   He also has multiple desquamation on trunk/palm and some pressure related eschar left lateral knee as well as smaller lesions heels. He also has some wound left chest wall that is well packed and showed no purulence. No obvious cellulitic changes. I do not believe he has strep/staph toxin syndrome recently. Will check for syphilis and hiv to be complete  I have also asked ortho to perform left knee arthrocentesis  Stills disease vs other rheumatologic process in the backburner but not invoked for w/u at this time   PLAN: 1. Appreciate ortho help; pending arthrocentesis at bedside tomorrow 2. Send serum hiv/rpr 3. Ordered knee fluid cell  count/bacterial/afb/fungal, crystal analysis 4. Trial of colchicine  I spent more than 35 minute reviewing data/chart, and coordinating care and >50% direct face to face time providing counseling/discussing diagnostics/treatment plan with patient   Active Problems:   Essential hypertension   AKI (acute kidney injury) ([place])   Alcohol dependence ([place])   Pressure injury of skin   Malnutrition of moderate degree   Hypernatremia   Hyperkalemia   Acute metabolic encephalopathy   ATN (acute tubular necrosis) ([facility])   Cerebral atrophy ([facility])   Failure to thrive in adult   Fever   Allergies  Allergen Reactions  . Lisinopril Swelling    ANGIOEDEMA    Scheduled Meds: . bupivacaine  10 mL Infiltration Once  . colchicine  0.6 mg Oral BID  . collagenase   Topical Daily  . enoxaparin (LOVENOX) injection  40 mg Subcutaneous Q24H  . feeding supplement  237 mL Oral TID WC & HS  . feeding supplement (PROSource TF)  45 mL Per Tube BID  . folic acid  1 mg Per Tube Daily  . free water  200 mL Per Tube Q6H  . magnesium oxide  400 mg Per Tube BID  . mouth rinse  15 mL Mouth Rinse BID  . methylPREDNISolone acetate  40 mg Intra-articular Once  . metoprolol tartrate  25 mg Per Tube BID  . mirtazapine  15 mg Oral QHS  . multivitamin  15 mL Per Tube Daily  . sennosides  5 mL Per Tube QHS  . thiamine  100 mg Per Tube Daily   Continuous Infusions: . sodium chloride 1,000 mL (10/21/20 1514)  . feeding supplement (OSMOLITE 1.5 CAL) 1,000 mL (11/04/20  1242)   PRN Meds:.sodium chloride, acetaminophen (TYLENOL) oral liquid 160 mg/5 mL, lip balm, LORazepam, ondansetron (ZOFRAN) IV, oxyCODONE, polyethylene glycol, polyvinyl alcohol, traMADol   SUBJECTIVE: Feels well Rather poor insight of his problems  Denies headache, cough, chest pain, sob, joint pain (initially but then reported tenderness on exam of joints see below)  Has rash on exam  No n/v/diarrhea On tube feed  Ongoing fever No  leukocytosis No abx  Review of Systems: ROS All other ROS was negative, except mentioned above     OBJECTIVE: Vitals:   11/04/20 0845 11/04/20 1000 11/04/20 1213 11/04/20 1500  BP: 116/77 129/77 116/70 131/70  Pulse: (!) 114 (!) 115 (!) 108 (!) 127  Resp: 18  18 18   Temp: (!) 101.1 F (38.4 C)  100.2 F (37.9 C) (!) 101.9 F (38.8 C)  TempSrc: Axillary  Axillary Axillary  SpO2: 100%  100% 100%  Weight:      Height:       Body mass index is 22.15 kg/m.  Physical Exam Chronically ill appearing, not able to make much movement in bed, well developed otherwise, conversant of soft articulation Heent: normocephalic; per; conj clear; cataract visualized  Oropharynx clear Neck supple cv rrr no mrg Lungs clear; normal respiratory effort abd s/nt Ext no edema Neuro diffuse generalized weakness; LE movement limited by knee tenderness Psych oriented to self; alert Skin --> left chest wound with packing no surrounding cellulitis change and no purulence; eschar plaque lateral left knee, bilateral heels, some scattered desquamationon on trunk palms msk --> left knee effusion/tenderness; slight effusion/right shoulder with tenderness; bilateral elbows/ankles mild tenderness  Lab Results Lab Results  Component Value Date   WBC 8.9 11/04/2020   HGB 7.5 (L) 11/04/2020   HCT 23.1 (L) 11/04/2020   MCV 93.9 11/04/2020   PLT 531 (H) 11/04/2020    Lab Results  Component Value Date   CREATININE 0.70 11/04/2020   BUN 28 (H) 11/04/2020   NA 131 (L) 11/04/2020   K 4.5 11/04/2020   CL 97 (L) 11/04/2020   CO2 26 11/04/2020    Lab Results  Component Value Date   ALT 31 11/03/2020   AST 35 11/03/2020   ALKPHOS 126 11/03/2020   BILITOT 0.4 11/03/2020      Microbiology: Recent Results (from the past 240 hour(s))  Culture, blood (routine x 2)     Status: None   Collection Time: 10/27/20  1:22 PM   Specimen: BLOOD  Result Value Ref Range Status   Specimen Description BLOOD RIGHT  ANTECUBITAL  Final   Special Requests   Final    BOTTLES DRAWN AEROBIC ONLY Blood Culture adequate volume   Culture   Final    NO GROWTH 5 DAYS Performed at [facility], [address] [place], [place] [address]    Report Status 11/01/2020 FINAL  Final  Culture, blood (routine x 2)     Status: None   Collection Time: 10/27/20  1:22 PM   Specimen: BLOOD  Result Value Ref Range Status   Specimen Description BLOOD LEFT ANTECUBITAL  Final   Special Requests   Final    BOTTLES DRAWN AEROBIC ONLY Blood Culture adequate volume   Culture   Final    NO GROWTH 5 DAYS Performed at [facility], [place] [address], [place], [place] [address]    Report Status 11/01/2020 FINAL  Final  Culture, blood (routine x 2)     Status: None (Preliminary result)  Collection Time: 11/01/20  6:10 PM   Specimen: BLOOD  Result Value Ref Range Status   Specimen Description BLOOD LEFT ANTECUBITAL  Final   Special Requests   Final    BOTTLES DRAWN AEROBIC AND ANAEROBIC Blood Culture adequate volume   Culture   Final    NO GROWTH 3 DAYS Performed at [facility], [address], [place], [place] [address]    Report Status PENDING  Incomplete  Culture, blood (routine x 2)     Status: None (Preliminary result)   Collection Time: 11/01/20  6:20 PM   Specimen: BLOOD LEFT HAND  Result Value Ref Range Status   Specimen Description BLOOD LEFT HAND  Final   Special Requests   Final    BOTTLES DRAWN AEROBIC AND ANAEROBIC Blood Culture adequate volume   Culture   Final    NO GROWTH 3 DAYS Performed at [facility], [place] [address], [place], [place] [address]    Report Status PENDING  Incomplete  MRSA PCR Screening     Status: None   Collection Time: 11/01/20  7:00 PM   Specimen: Nasal Mucosa; Nasopharyngeal  Result Value Ref Range Status   MRSA by PCR NEGATIVE NEGATIVE Final    Comment:        The GeneXpert MRSA Assay (FDA approved for NASAL specimens only), is one component of  a comprehensive MRSA colonization surveillance program. It is not intended to diagnose MRSA infection nor to guide or monitor treatment for MRSA infections. Performed at [facility], [place] [address], [place], [place] [address]      Serology:   Imaging: If present, new imagings (plain films, ct scans, and mri) have been personally visualized and interpreted; radiology reports have been reviewed. Decision making incorporated into the Impression / Recommendations.  4/24 tte 1. Left ventricular ejection fraction, by estimation, is 60 to 65%. The  left ventricle has normal function. The left ventricle has no regional  wall motion abnormalities. There is mild left ventricular hypertrophy.  2. Right ventricular systolic function was not well visualized. The right  ventricular size is not well visualized.  3. The mitral valve is normal in structure. No evidence of mitral valve  regurgitation. No evidence of mitral stenosis.  4. The aortic valve is tricuspid. Aortic valve regurgitation is not  visualized. No aortic stenosis is present.     Jabier Mutton, [facility] [place] [facility] [place] [phone] pager    11/04/2020, 5:59 PM

## 2020-11-04 NOTE — Progress Notes (Addendum)
Calorie Count Note  48 hour calorie count ordered.  Diet: Dysphagia 2 diet with thin liquids.  Supplements:   Ensure Enlive po QID, each supplement provides 350 kcal and 20 grams of protein.  Magic cup TID with meals, each supplement provides 290 kcal and 9 grams of protein  Day 1: Breakfast: 0% Lunch: 0% Dinner: 10 kcal, 0 grams of protein Supplements: 700 kcal and 40 grams of protein  Day 1 Total intake: 710 kcal (31% of minimum estimated needs)  40 grams of protein (35% of minimum estimated needs)  Day 2: Breakfast: 198 kcal, 3 grams of protein Lunch: 0% Dinner: 0% Supplements: 350 kcal and 20 grams of protein  Day 2 Total intake: 548 kcal (24% of minimum estimated needs)  23  grams of protein (20% of minimum estimated needs)  Estimated Nutritional Needs:  Kcal:  2300-2500 kcal Protein:  115-130 grams Fluid:  >/= 2 L/day  Pt continues to have poor po intake. Pt only taking bites of food on occasion. Tube feeds have been resumed. Goals of care meeting with family schedule for Wednesday, 4/27. Palliative recommends comfort approach. RD to continue with current feeding regimen and orders at this time.  Nutrition Dx:  Moderate Malnutrition related to chronic illness (CHF) as evidenced by mild fat depletion,moderate muscle depletion; ongoing  Goal:  Pt to meet >/= 90% of their estimated nutrition needs; not met by PO  Intervention:  ProvideEnsure Enlive poTID, each supplement provides 350 kcal and 20 grams of protein.  ProvideMagic cup TID with meals, each supplement provides 290 kcal and 9 grams of protein.  Continue Osmolite 1.5 cal formula via Cortrak NGT at goal rate of 65 ml/hr with 45 ml Prosource TF BID per tube.   Free water flushes of 200 ml every 6 hours per tube.   Tube feeding regimen provides 2420 kcal, 120 grams of protein, and 1986 ml free water  Corrin Parker, MS, RD, LDN RD pager number/after hours weekend pager number on Amion.

## 2020-11-04 NOTE — Progress Notes (Signed)
Occupational Therapy Treatment Patient Details Name: Raymond Stone MRN: [id number] DOB: Aug 21, 1950 Today's Date: 11/04/2020    History of present illness Pt is a 70 y/o male admitted 10/12/20 after being found down for unkown length of time at home (apparently pt had not been seen for ~5 days). Workup for several pressure injuries, hypovolemic shock complicated by acute kidney injury. Underwent CRRT 4/3. Poor oral intake s/p cortrak 4/15. PMH includes HTN, alcohol dependence, malnutrition.   OT comments  Pt alert and interacting. Reported he was cold, but had not attempted to call for assistance. Provided warm blanket, opened blinds and turned thermostat to warm. Total assist for drinking Ensure. Participated in B UE AAROM all areas. Pt requesting assistance with tv. Pt declined sitting at EOB.   Follow Up Recommendations  SNF;Supervision/Assistance - 24 hour    Equipment Recommendations  Wheelchair (measurements OT);Wheelchair cushion (measurements OT);Hospital bed    Recommendations for Other Services      Precautions / Restrictions Precautions Precautions: Fall Precaution Comments: Bladder/bowel incontinence, multiple wounds; cortrak Other Brace: bilateral prevalon boots       Mobility Bed Mobility                    Transfers                      Balance                                           ADL either performed or assessed with clinical judgement   ADL Overall ADL's : Needs assistance/impaired Eating/Feeding: Bed level;Maximal assistance Eating/Feeding Details (indicate cue type and reason): to drink Ensure                                         Vision       Perception     Praxis      Cognition Arousal/Alertness: Awake/alert Behavior During Therapy: Flat affect Overall Cognitive Status: Impaired/Different from baseline Area of Impairment: Orientation;Memory;Following  commands;Awareness;Safety/judgement;Problem solving                 Orientation Level: Disoriented to;Place;Time;Situation Current Attention Level: Alternating Memory: Decreased recall of precautions;Decreased short-term memory Following Commands: Follows one step commands with increased time Safety/Judgement: Decreased awareness of safety;Decreased awareness of deficits Awareness: Intellectual Problem Solving: Slow processing;Decreased initiation;Difficulty sequencing;Requires verbal cues;Requires tactile cues General Comments: pt stating he was cold, but had not called for help        Exercises Exercises: General Upper Extremity General Exercises - Upper Extremity Shoulder Flexion: AAROM;Both;10 reps;Supine Shoulder Extension: AAROM;Both;10 reps;Supine Shoulder ABduction: Both;AAROM;10 reps;Supine Shoulder ADduction: AAROM;Both;10 reps;Supine Shoulder Horizontal ABduction: AAROM;Both;10 reps;Supine Shoulder Horizontal ADduction: AAROM;Both;10 reps;Supine Elbow Flexion: AAROM;Both;15 reps;Supine Elbow Extension: AAROM;Both;15 reps;Supine Wrist Flexion: AAROM;Both;5 reps Wrist Extension: AAROM;Both;5 reps Digit Composite Flexion: AROM;Both;5 reps Composite Extension: AROM;Both;5 reps   Shoulder Instructions       General Comments      Pertinent Vitals/ Pain       Pain Assessment: No/denies pain  Home Living  Prior Functioning/Environment              Frequency  Min 2X/week        Progress Toward Goals  OT Goals(current goals can now be found in the care plan section)  Progress towards OT goals: Not progressing toward goals - comment  Acute Rehab OT Goals Patient Stated Goal: to get warmer OT Goal Formulation: Patient unable to participate in goal setting Time For Goal Achievement: 11/15/20 Potential to Achieve Goals: [place] Discharge plan remains appropriate;Frequency remains  appropriate    Co-evaluation                 AM-PAC OT "6 Clicks" Daily Activity     Outcome Measure   Help from another person eating meals?: Total Help from another person taking care of personal grooming?: Total Help from another person toileting, which includes using toliet, bedpan, or urinal?: Total Help from another person bathing (including washing, rinsing, drying)?: Total Help from another person to put on and taking off regular upper body clothing?: Total Help from another person to put on and taking off regular lower body clothing?: Total 6 Click Score: 6    End of Session    OT Visit Diagnosis: Muscle weakness (generalized) (M62.81);Other symptoms and signs involving cognitive function   Activity Tolerance Patient limited by fatigue   Patient Left in bed;with call bell/phone within reach;with bed alarm set   Nurse Communication          Time: 0092-3300 OT Time Calculation (min): 16 min  Charges: OT General Charges $OT Visit: 1 Visit OT Treatments $Therapeutic Exercise: 8-22 mins  [name], OTR/L Acute Rehabilitation Services Pager: [phone] Office: [phone]   Malka So 11/04/2020, 12:52 PM

## 2020-11-04 NOTE — Progress Notes (Addendum)
Pt has refused medications and secure chat has been sent to [name] advising. Pt yelling and does not want anyone in his room.

## 2020-11-04 NOTE — Telephone Encounter (Signed)
Requested medication (s) are due for refill today: yes  Requested medication (s) are on the active medication list: yes  Last refill:  07/26/20 #270 0 refills  Future visit scheduled: no  Notes to clinic:  called patient on #[phone], report given this is no loner patient contact. Called patient's daughter, [phone], [name]; daughter reported patient is currently in the hospital and unable to schedule appt for future refills. Do you want to refill Rx?     Requested Prescriptions  Pending Prescriptions Disp Refills   metoprolol tartrate (LOPRESSOR) 25 MG tablet [Pharmacy Med Name: METOPROLOL TARTRATE 25MG  TABLETS] 270 tablet 0    Sig: TAKE 1 TABLET(25 MG) BY MOUTH THREE TIMES DAILY      Cardiovascular:  Beta Blockers Failed - 11/04/2020  3:19 PM      Failed - Valid encounter within last 6 months    Recent Outpatient Visits           2 months ago Essential hypertension   [place], Foster P, NP   9 months ago Screening for diabetes mellitus   [place], [place] P, NP   1 year ago [place], [place], NP   2 years ago Essential hypertension   [place], Roger David, PA-C   3 years ago Essential hypertension   [place], Roger David, PA-C                Passed - Last BP in normal range    BP Readings from Last 1 Encounters:  11/04/20 116/70          Passed - Last Heart Rate in normal range    Pulse Readings from Last 1 Encounters:  11/04/20 (!) 108

## 2020-11-04 NOTE — Progress Notes (Signed)
PROGRESS NOTE  Raymond Stone [id number] DOB: [birth year] DOA: 10/12/2020 PCP: [name], NP  HPI/Recap of past 24 hours: Mr. Moga admitted to the hospital with working diagnosis hypovolemic shock complicated by acute kidney injury/ATN. 70 year old male past medical history of hypertension who was found down at his home, apparently he was not seen for about 5 days. When EMS arrived he was severely hypotensive, his temperature was 100 F he received intravenous fluids and transported to the emergency room.   Per his daughter Raymond Stone via phone on 11/01/2020 the patient was fully functional prior to this.  Was ambulating and was independent with all his ADLs.  Admission CT head was nonacute.  Sodium 172, potassium 7.3, chloride>130,bicarb 15, glucose 156, BUN 211, creatinine 11.2, AST 118, ALT 42, total bilirubin is 1.5, troponin I 489-395, lactic acid 5.3. anion gap 49. ABG, pH 7.37, PCO2 26, PO2 96, bicarb 15 White cell count 18.7, hemoglobin 11.1, hematocrit 37.6, platelets 346. SARS COVID-19 negative. Urinalysis specific gravity 1.026, negative protein, 21-50 white cells, 0-5 red cells. Toxicology negative. Head CT negative for acute changes.  Chest radiograph no infiltrates. EKG had 19 bpm, normal axis, normal intervals, sinus rhythm, no ST segment or T wave changes. Patient was diagnosed with shock, he received intravenous fluids and broad-spectrum antibiotic therapy. Patient underwent CRRT04/03with further improvement of his kidney function. Peak CK 2,415 mild rhabdomyolysis likely not the culprit of renal failure. Patient very debilitated.  CT abdomen and pelvis done on 10/23/2020 which showed no acute intra-abdominal pathology.  Due to ongoing poor oral intake, we discussed need for at least temporary tube feeding.  Patient was amenable.  Coretrak was placed on 10/25/2020 and tube feeds initiated, cortract was clamped on 11/01/2020 for calorie count.  On  10/31/2020 the patient was more alert and answering questions appropriately.  Repeat CT head and CT cervical spine done on 11/01/20 non acute.  CT cervical spine showed 1 erosive change of the dens.  Curb sided with neurosurgery [name].  Recommended pain control and follow-up outpatient.  He has had intermittent fevers for which infectious disease was consulted.  Recommended to hold antibiotics at this time given his overall stability.  11/04/20: Seen and examined at his bedside.  No new complaints at the time of this visit.  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  Plan for left knee arthrocentesis tomorrow.  Assessment/Plan: Active Problems:   Essential hypertension   AKI (acute kidney injury) ([facility])   Alcohol dependence (HCC)   Pressure injury of skin   Malnutrition of moderate degree   Hypernatremia   Hyperkalemia   Acute metabolic encephalopathy   ATN (acute tubular necrosis) (HCC)   Cerebral atrophy (HCC)   Failure to thrive in adult   Fever  Resolved acute metabolic encephalopathy  Resolved uremic encephalopathy Advanced cerebral atrophy Adult failure to thrive -metabolic derangements corrected since admission and he is technically oriented (although some worsening at times) but he is not thriving well at all; does not eat much and is so weak/deconditioned he may not have the physical reserve to survive this illness - for now, core track tube feeding to strengthen/re-condition as able; if fails, he would be more appropriate for hospice/comfort care and end of life discussions; Palliative care greatly assisting - s/p cortrak 4/15 and TF started on 10/25/20  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 Plan for left arthrocentesis tomorrow 11/05/2020. Started on colchicine by ID.  Generalized weakness affecting upper extremities -CT head done on 11/01/2020, nonacute. CT  cervical spine showing erosive changes of the dens and severe right C2-3 facet  arthrosis Discussed with neurosurgery, [name] on 11/02/2020, recommended pain control and follow-up outpatient. Continue PT OT as tolerated and with assistance.  Persistent recurrent fevers and tachycardia Discussed with ID Possibly multifactorial, possibly associated with wounds versus others ID recommended to continue to hold off antibiotics due to overall clinical stability. T-max 101.9.  Persistent tachycardia likely secondary to recurrent fevers. He is on metoprolol 25 mg twice daily Continue to hydrate Antipyretics  Erosive changes of the dens/severe right C2-3 facet arthrosis Curb sided with neurosurgery, [name] on 11/02/2020, recommended pain control and follow-up outpatient.  Subclinical hypothyroidism. TSH greater than 7, free T4 normal 0.90 Repeat TSH in 6 to 8 weeks or when acute illness has resolved  Moderatecalorie protein malnutrition Poor oral intake - Continue with nutritional supplements, multivitamins,and thiamine. -Continue dysphagia diet; not taking in much nutrition and at risk for further decline/complications; ongoing [place] with daughter; PCM assisting discussions - agree with trial of mirtazapine - Cortrak placed on 4/15 and TF initiated, clamped on 11/01/2020 for calorie counts Restarted core track tube feeding on 11/02/2020 as the patient had minimal oral intake.  Intermittently tachycardia suspect related to possible dehydration and fevers  MRSA screen negative Procalcitonin 0.30 Blood cultures negative to date. Personally reviewed chest x-ray done on 11/02/2020 no acute cardiopulmonary disease. Increased free water flushes to 200 cc every 6 hours.  Resolved post with patient: Hypomagnesemia Continue replacement via core track tube feeding.  Resolved hypovolemic shock with AKI/ATN Resolved hypernatremia - 172 on admission Resolved AGMA 2/2 renal failure - s/p volume resuscitation on admission followed by CRRT - renal function now  normalized  Sepsis 2/2 wounds (not present on admission). Patient with complex wounds, left hemithorax and left thigh - s/p abx course x 5 days Continue local wound care Continue to improve nutritional status.  HTN. BP is currently at goal  -Continue Lopressor Continue to monitor vital signs.  Alcohol abuse - continue folic, thiamine, MVI - PRN benzo  Aspiration pneumonitis (present on admission)  Continue with aspiration precautions. O2 saturation 97% on room air.  Goals of care Bedbound, multiple decubitus ulcers, dysphagia requiring culture, minimally interactive, failure to thrive in an adult. Palliative care following, appreciate assistance. Currently the patient is full code. Goals of care discussion with family and attending plan for Wednesday, 11/06/2020.   Antimicrobials: Zosyn 4/3 - 4/4 Rocephin 4/10 >> 4/15 Vanc 4/10 >> 4/15  DVT prophylaxis: enoxaparin (LOVENOX) injection 40 mg Start: 10/17/20 1530 SCDs Start: 10/12/20 2213   Code Status:   Code Status: Full Code Family Communication:  Updated his daughter Raymond Stone via phone on 10/31/2020 and 11/01/2020.  Disposition Plan: Status is: Inpatient  Remains inpatient appropriate because:IV treatments appropriate due to intensity of illness or inability to take PO and Inpatient level of care appropriate due to severity of illness   Dispo: The patient is from: Home  Anticipated d/c is to: SNF  Patient currently is not medically stable to d/c.              Difficult to place patient No      Objective: Vitals:   11/04/20 0845 11/04/20 1000 11/04/20 1213 11/04/20 1500  BP: 116/77 129/77 116/70 131/70  Pulse: (!) 114 (!) 115 (!) 108 (!) 127  Resp: 18  18 18   Temp: (!) 101.1 F (38.4 C)  100.2 F (37.9 C) (!) 101.9 F (38.8 C)  TempSrc: Axillary  Axillary Axillary  SpO2: 100%  100% 100%  Weight:      Height:        Intake/Output Summary (Last 24 hours) at  11/04/2020 1655 Last data filed at 11/04/2020 0650 Gross per 24 hour  Intake 520 ml  Output 1475 ml  Net -955 ml   Filed Weights   10/30/20 0413 10/31/20 0419 11/04/20 0435  Weight: 74.4 kg 73 kg 68 kg    Exam:  . General: 70 y.o. year-old male chronically ill-appearing no acute stress.  He is alert oriented x3.   . Cardiovascular: Tachycardia with no rubs or gallops.  No JVD noted. Marland Kitchen Respiratory: Clear to auscultation no wheezes or rales  . abdomen: Soft nontender no bowel sounds present.   . Musculoskeletal: Trace lower extremity edema bilaterally.   . Skin: Diffuse pressure wounds in lower extremities bilaterally. Marland Kitchen Psychiatry: Mood is appropriate for condition setting.  Data Reviewed: CBC: Recent Labs  Lab 10/30/20 0304 10/30/20 1828 10/31/20 0310 11/01/20 0219 11/02/20 0300 11/03/20 0659 11/04/20 0809  WBC 9.7  --  8.7 7.7 8.0 9.1 8.9  NEUTROABS 6.6  --  5.9 5.1 4.9 6.1  --   HGB 7.7*   < > 7.9* 7.4* 8.5* 7.0* 7.5*  HCT 24.0*   < > 23.7* 22.9* 26.2* 21.9* 23.1*  MCV 96.8  --  95.2 95.0 94.6 96.1 93.9  PLT 379  --  356 342 393  --  531*   < > = values in this interval not displayed.   Basic Metabolic Panel: Recent Labs  Lab 10/30/20 0304 10/31/20 0310 11/01/20 0219 11/02/20 0300 11/03/20 0659 11/04/20 0809  NA 131* 130* 130* 132* 132* 131*  K 4.6 4.7 4.4 4.4 5.1 4.5  CL 98 97* 96* 97* 101 97*  CO2 27 26 24 26  21* 26  GLUCOSE 123* 121* 132* 96 138* 130*  BUN 26* 27* 29* 36* 35* 28*  CREATININE 0.81 0.80 0.82 0.92 0.89 0.70  CALCIUM 9.9 9.7 9.5 10.1 9.9 10.0  MG 1.5* 1.8 1.7 1.9 1.8  --   PHOS 4.1 4.1 4.7* 6.4* 4.0  --    GFR: Estimated Creatinine Clearance: 83.8 mL/min (by C-G formula based on SCr of 0.7 mg/dL). Liver Function Tests: Recent Labs  Lab 11/03/20 0659  AST 35  ALT 31  ALKPHOS 126  BILITOT 0.4  PROT 7.1  ALBUMIN 1.6*   No results for input(s): LIPASE, AMYLASE in the last 168 hours. No results for input(s): AMMONIA in the last 168  hours. Coagulation Profile: No results for input(s): INR, PROTIME in the last 168 hours. Cardiac Enzymes: No results for input(s): CKTOTAL, CKMB, CKMBINDEX, TROPONINI in the last 168 hours. BNP (last 3 results) No results for input(s): PROBNP in the last 8760 hours. HbA1C: No results for input(s): HGBA1C in the last 72 hours. CBG: Recent Labs  Lab 11/04/20 0016 11/04/20 0425 11/04/20 0844 11/04/20 1210 11/04/20 1645  GLUCAP 126* 138* 115* 126* 131*   Lipid Profile: No results for input(s): CHOL, HDL, LDLCALC, TRIG, CHOLHDL, LDLDIRECT in the last 72 hours. Thyroid Function Tests: No results for input(s): TSH, T4TOTAL, FREET4, T3FREE, THYROIDAB in the last 72 hours. Anemia Panel: No results for input(s): VITAMINB12, FOLATE, FERRITIN, TIBC, IRON, RETICCTPCT in the last 72 hours. Urine analysis:    Component Value Date/Time   COLORURINE YELLOW 10/20/2020 0815   APPEARANCEUR HAZY (A) 10/20/2020 0815   LABSPEC 1.020 10/20/2020 0815   PHURINE 5.0 10/20/2020 0815   GLUCOSEU >=500 (A) 10/20/2020 0815  HGBUR SMALL (A) 10/20/2020 0815   BILIRUBINUR NEGATIVE 10/20/2020 0815   KETONESUR 5 (A) 10/20/2020 0815   PROTEINUR 30 (A) 10/20/2020 0815   NITRITE NEGATIVE 10/20/2020 0815   LEUKOCYTESUR NEGATIVE 10/20/2020 0815   Sepsis Labs: @LABRCNTIP (procalcitonin:4,lacticidven:4)  ) Recent Results (from the past 240 hour(s))  Culture, blood (routine x 2)     Status: None   Collection Time: 10/27/20  1:22 PM   Specimen: BLOOD  Result Value Ref Range Status   Specimen Description BLOOD RIGHT ANTECUBITAL  Final   Special Requests   Final    BOTTLES DRAWN AEROBIC ONLY Blood Culture adequate volume   Culture   Final    NO GROWTH 5 DAYS Performed at [facility], [place] [address], [place], [place] [address]    Report Status 11/01/2020 FINAL  Final  Culture, blood (routine x 2)     Status: None   Collection Time: 10/27/20  1:22 PM   Specimen: BLOOD  Result Value Ref Range Status    Specimen Description BLOOD LEFT ANTECUBITAL  Final   Special Requests   Final    BOTTLES DRAWN AEROBIC ONLY Blood Culture adequate volume   Culture   Final    NO GROWTH 5 DAYS Performed at [facility], [place] [address], [place], [place] [address]    Report Status 11/01/2020 FINAL  Final  Culture, blood (routine x 2)     Status: None (Preliminary result)   Collection Time: 11/01/20  6:10 PM   Specimen: BLOOD  Result Value Ref Range Status   Specimen Description BLOOD LEFT ANTECUBITAL  Final   Special Requests   Final    BOTTLES DRAWN AEROBIC AND ANAEROBIC Blood Culture adequate volume   Culture   Final    NO GROWTH 3 DAYS Performed at [facility], [place] [address] [place], [place] [address]    Report Status PENDING  Incomplete  Culture, blood (routine x 2)     Status: None (Preliminary result)   Collection Time: 11/01/20  6:20 PM   Specimen: BLOOD LEFT HAND  Result Value Ref Range Status   Specimen Description BLOOD LEFT HAND  Final   Special Requests   Final    BOTTLES DRAWN AEROBIC AND ANAEROBIC Blood Culture adequate volume   Culture   Final    NO GROWTH 3 DAYS Performed at [facility], [place] [address] [place], [place] [address]    Report Status PENDING  Incomplete  MRSA PCR Screening     Status: None   Collection Time: 11/01/20  7:00 PM   Specimen: Nasal Mucosa; Nasopharyngeal  Result Value Ref Range Status   MRSA by PCR NEGATIVE NEGATIVE Final    Comment:        The GeneXpert MRSA Assay (FDA approved for NASAL specimens only), is one component of a comprehensive MRSA colonization surveillance program. It is not intended to diagnose MRSA infection nor to guide or monitor treatment for MRSA infections. Performed at [facility], [place] [address], [place], [place] [address]       Studies: No results found.  Scheduled Meds: . bupivacaine  10 mL Infiltration Once  . colchicine  0.6 mg Oral BID  . collagenase   Topical Daily  .  enoxaparin (LOVENOX) injection  40 mg Subcutaneous Q24H  . feeding supplement  237 mL Oral TID WC & HS  . feeding supplement (PROSource TF)  45 mL Per Tube BID  . folic acid  1 mg Per Tube Daily  . free water  200 mL Per Tube Q6H  . magnesium oxide  400 mg Per Tube BID  . mouth rinse  15 mL Mouth Rinse BID  . methylPREDNISolone acetate  40 mg Intra-articular Once  . metoprolol tartrate  25 mg Per Tube BID  . mirtazapine  15 mg Oral QHS  . multivitamin  15 mL Per Tube Daily  . sennosides  5 mL Per Tube QHS  . thiamine  100 mg Per Tube Daily    Continuous Infusions: . sodium chloride 1,000 mL (10/21/20 1514)  . feeding supplement (OSMOLITE 1.5 CAL) 1,000 mL (11/04/20 1242)     LOS: 23 days     Kayleen Memos, MD [facility] Pager [phone]  If 7PM-7AM, please contact night-coverage [email or web address] Password [facility] 11/04/2020, 4:55 PM

## 2020-11-04 NOTE — Progress Notes (Signed)
Physical Therapy Wound Treatment Patient Details  Name: Raymond Stone MRN: [id number] Date of Birth: 1951/02/15  Today's Date: 11/04/2020 Time: 9622-2979 Time Calculation (min): 35 min  Subjective  Subjective Assessment Subjective: Pleasant and agreeable to hydrotherapy however still appears confused at times. Patient and Family Stated Goals: None stated Date of Onset:  (Unsure) Prior Treatments: Dressing changes prior to hydro being consulted.  Pain Score:  Pt tolerated treatment well with complaints of pain with repositioning to be able to visualize the L thigh wound better. No complaints with actual hydrotherapy treatment.   Wound Assessment  Pressure Injury 10/13/20 Thigh Anterior;Left;Proximal Unstageable - Full thickness tissue loss in which the base of the injury is covered by slough (yellow, tan, gray, green or brown) and/or eschar (tan, brown or black) in the wound bed. (Active)  Dressing Type ABD;Barrier Film (skin prep);Gauze (Comment);Moist to moist;Santyl 11/04/20 1313  Dressing Changed;Clean;Dry;Intact 11/04/20 1313  Dressing Change Frequency Monday, Wednesday, Friday 11/04/20 1313  State of Healing Early/partial granulation 11/04/20 1313  Site / Wound Assessment Pink;Yellow 11/04/20 1313  % Wound base Red or Granulating 35% 11/04/20 1313  % Wound base Yellow/Fibrinous Exudate 65% 11/04/20 1313  % Wound base Black/Eschar 0% 11/04/20 1313  % Wound base Other/Granulation Tissue (Comment) 0% 11/04/20 1313  Peri-wound Assessment Intact 11/04/20 1313  Wound Length (cm) 14.2 cm 11/01/20 1051  Wound Width (cm) 8.5 cm 11/01/20 1051  Wound Depth (cm) 3.8 cm 11/01/20 1051  Wound Surface Area (cm^2) 120.7 cm^2 11/01/20 1051  Wound Volume (cm^3) 458.66 cm^3 11/01/20 1051  Tunneling (cm) 0 11/01/20 1317  Undermining (cm) 0 11/01/20 1317  Margins Unattached edges (unapproximated) 11/04/20 1313  Drainage Amount Minimal 11/04/20 1313  Drainage Description Serosanguineous 11/04/20  1313  Treatment Debridement (Selective);Hydrotherapy (Pulse lavage);Packing (Saline gauze) 11/04/20 1313     Pressure Injury 10/13/20 Flank Left;Upper Unstageable - Full thickness tissue loss in which the base of the injury is covered by slough (yellow, tan, gray, green or brown) and/or eschar (tan, brown or black) in the wound bed. (Active)  Dressing Type ABD;Barrier Film (skin prep);Gauze (Comment);Moist to moist;Santyl 11/04/20 1313  Dressing Changed;Clean;Dry;Intact 11/04/20 1313  Dressing Change Frequency Monday, Wednesday, Friday 11/04/20 1313  State of Healing Early/partial granulation 11/04/20 1313  Site / Wound Assessment Pink;Red;Yellow 11/04/20 1313  % Wound base Red or Granulating 40% 11/04/20 1313  % Wound base Yellow/Fibrinous Exudate 60% 11/04/20 1313  % Wound base Black/Eschar 0% 11/04/20 1313  % Wound base Other/Granulation Tissue (Comment) 0% 11/04/20 1313  Peri-wound Assessment Intact 11/04/20 1313  Wound Length (cm) 4.8 cm 11/01/20 1051  Wound Width (cm) 13 cm 11/01/20 1051  Wound Depth (cm) 1.8 cm 11/01/20 1051  Wound Surface Area (cm^2) 62.4 cm^2 11/01/20 1051  Wound Volume (cm^3) 112.32 cm^3 11/01/20 1051  Tunneling (cm) 0 11/01/20 1317  Undermining (cm) 0 11/01/20 1317  Margins Unattached edges (unapproximated) 11/04/20 1313  Drainage Amount Moderate 11/04/20 1313  Drainage Description Serosanguineous 11/04/20 1313  Treatment Debridement (Selective);Hydrotherapy (Pulse lavage);Packing (Saline gauze) 11/04/20 1313      Hydrotherapy Pulsed lavage therapy - wound location: L flank (upper and lower), L thigh Pulsed Lavage with Suction (psi): 12 psi Pulsed Lavage with Suction - Normal Saline Used: 2000 mL Pulsed Lavage Tip: Tip with splash shield Selective Debridement Selective Debridement - Location: upper L flank wound, L thigh Selective Debridement - Tools Used: Forceps,Scalpel,Scissors Selective Debridement - Tissue Removed: yellow slough, necrotic adipose     Wound Assessment and Plan  Wound Therapy - Assess/Plan/Recommendations  Wound Therapy - Clinical Statement: L upper flank wound and L thigh wound are progressing well with an increase in healthy tissue noted this session compared to Friday (4/22). This patient will benefit from continued hydrotherapy for selective removal of unviable tissue, to decrease bioburden, and promote wound bed healing. Wound Therapy - Functional Problem List: Global weakness in the setting of undetermined time down at home. Factors Delaying/Impairing Wound Healing: Infection - systemic/local,Immobility Hydrotherapy Plan: Debridement,Dressing change,Patient/family education,Pulsatile lavage with suction Wound Therapy - Frequency: 3X / week Wound Therapy - Current Recommendations: PT Wound Therapy - Follow Up Recommendations: dressing changes by RN  Wound Therapy Goals- Improve the function of patient's integumentary system by progressing the wound(s) through the phases of wound healing (inflammation - proliferation - remodeling) by: Wound Therapy Goals - Improve the function of patient's integumentary system by progressing the wound(s) through the phases of wound healing by: Decrease Necrotic Tissue to: 20% Decrease Necrotic Tissue - Progress: Progressing toward goal Increase Granulation Tissue to: 80% Increase Granulation Tissue - Progress: Progressing toward goal Goals/treatment plan/discharge plan were made with and agreed upon by patient/family: No, Patient unable to participate in goals/treatment/discharge plan and family unavailable Time For Goal Achievement: 7 days Wound Therapy - Potential for Goals: Fair  Goals will be updated until maximal potential achieved or discharge criteria met.  Discharge criteria: when goals achieved, discharge from hospital, MD decision/surgical intervention, no progress towards goals, refusal/missing three consecutive treatments without notification or medical reason.  GP      Charges PT Wound Care Charges $Wound Debridement up to 20 cm: < or equal to 20 cm $ Wound Debridement each add'l 20 sqcm: 6 $PT PLS Gun and Tip: 1 Supply $PT Hydrotherapy Visit: 2 Visits       Thelma Comp 11/04/2020, 1:25 PM   Rolinda Roan, PT, DPT Acute Rehabilitation Services Pager: [phone] Office: [phone]

## 2020-11-04 NOTE — Progress Notes (Signed)
   11/04/20 1500  Assess: MEWS Score  Temp (!) 101.9 F (38.8 C)  BP 131/70  Pulse Rate (!) 127  ECG Heart Rate (!) 124  Resp 18  SpO2 100 %  O2 Device Room Air  Assess: MEWS Score  MEWS Temp 2  MEWS Systolic 0  MEWS Pulse 2  MEWS RR 0  MEWS LOC 0  MEWS Score 4  MEWS Score Color Red  Assess: if the MEWS score is Yellow or Red  Were vital signs taken at a resting state? Yes  Focused Assessment No change from prior assessment  Early Detection of Sepsis Score *See Row Information* Low  Treat  MEWS Interventions Administered prn meds/treatments  Pain Scale Faces  Faces Pain Scale 0  Take Vital Signs  Increase Vital Sign Frequency  Red: Q 1hr X 4 then Q 4hr X 4, if remains red, continue Q 4hrs  Escalate  MEWS: Escalate Red: discuss with charge nurse/RN and provider, consider discussing with RRT  Notify: Charge Nurse/RN  Name of Charge Nurse/RN Notified Linda, RN  Date Charge Nurse/RN Notified 11/04/20  Time Charge Nurse/RN Notified 1515  Notify: Provider  Provider Name/Title [name]  Date Provider Notified 11/04/20  Time Provider Notified 1736  Notification Type Page (secure chat)  Notification Reason Other (Comment) (just FYI)  Document  Patient Outcome Other (Comment) (monitoring for temperature change)  Progress note created (see row info) Yes

## 2020-11-05 ENCOUNTER — Inpatient Hospital Stay (HOSPITAL_COMMUNITY): Payer: Medicare Other

## 2020-11-05 DIAGNOSIS — R6510 Systemic inflammatory response syndrome (SIRS) of non-infectious origin without acute organ dysfunction: Secondary | ICD-10-CM

## 2020-11-05 DIAGNOSIS — M109 Gout, unspecified: Secondary | ICD-10-CM

## 2020-11-05 LAB — SYNOVIAL CELL COUNT + DIFF, W/ CRYSTALS
Eosinophils-Synovial: 0 % (ref 0–1)
Lymphocytes-Synovial Fld: 4 % (ref 0–20)
Monocyte-Macrophage-Synovial Fluid: 1 % — ABNORMAL LOW (ref 50–90)
Neutrophil, Synovial: 95 % — ABNORMAL HIGH (ref 0–25)
WBC, Synovial: 3825 /mm3 — ABNORMAL HIGH (ref 0–200)

## 2020-11-05 LAB — GLUCOSE, CAPILLARY
Glucose-Capillary: 122 mg/dL — ABNORMAL HIGH (ref 70–99)
Glucose-Capillary: 123 mg/dL — ABNORMAL HIGH (ref 70–99)
Glucose-Capillary: 123 mg/dL — ABNORMAL HIGH (ref 70–99)
Glucose-Capillary: 139 mg/dL — ABNORMAL HIGH (ref 70–99)
Glucose-Capillary: 154 mg/dL — ABNORMAL HIGH (ref 70–99)

## 2020-11-05 LAB — RPR: RPR Ser Ql: NONREACTIVE

## 2020-11-05 NOTE — Progress Notes (Signed)
PROGRESS NOTE  Raymond Stone ZJQ:[id number] DOB: [birth year] DOA: 10/12/2020 PCP: [name], NP  HPI/Recap of past 24 hours: Raymond Stone admitted to the hospital with working diagnosis hypovolemic shock complicated by acute kidney injury/ATN. 70 year old male past medical history of hypertension who was found down at his home, apparently he was not seen for 5 days. When EMS arrived he was severely hypotensive, his temperature was 100 F he received intravenous fluids and was transported to the emergency room.   Per his daughter Raymond Stone via phone on 11/01/2020 the patient was fully functional prior to this.  Was ambulating and was independent with all his ADLs.  Admission CT head was nonacute, repeated CT head was also nonacute.  Patient had severe hypernatremia sodium 172 and severe hyperkalemia 7.3, had a BUN of 211 and creatinine of 11.2, elevated troponin 489, CPK greater than 2400 on admission and was admitted to the ICU.  He underwent CRRT on 10/13/2020 with improvement of his kidney function.  His CPK peaked at greater than 2400.  CT abdomen and pelvis done on 10/23/2020 showed no acute intra-abdominal pathology.  Due to ongoing poor oral intake Coretrak was placed on 10/25/2020 and tube feeds initiated, cortract was clamped on 11/01/2020 for calorie count.  Patient was not able to take adequate amount of oral nutrition.  Core track tube feeding was restarted on 11/02/2020.  Since 10/31/2020 the patient has been more alert and answering questions appropriately.  CT cervical spine done on 11/01/2020 showed 1 erosive change of the dens.  Curb sided with neurosurgery [name].  Recommended pain control and follow-up outpatient.  He has had persistent intermittent fevers for which infectious disease was consulted.  Recommended to hold antibiotics for now given his overall stability.  He was started on colchicine by ID on 11/04/2020 due to concern for polyarticular gout.  Post left knee  aspiration on 11/05/2020 by orthopedic surgery.  11/05/20: Patient was seen and examined at his bedside.  He has no new complaint at the time of this visit.  He is alert and oriented x3.    Assessment/Plan: Active Problems:   Essential hypertension   AKI (acute kidney injury) ([place])   Alcohol dependence (HCC)   Pressure injury of skin   Malnutrition of moderate degree   Hypernatremia   Hyperkalemia   Acute metabolic encephalopathy   ATN (acute tubular necrosis) (HCC)   Cerebral atrophy (HCC)   Failure to thrive in adult   Fever, unknown origin   Polyarthralgia   History of gout  Resolved acute metabolic encephalopathy  Resolved uremic encephalopathy Adult failure to thrive Metabolic arrangements corrected. He is alert and oriented x3. Palliative care team assisting with goals of care discussion. 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 Post left knee aspiration on 11/05/2020 by orthopedic surgery, follow fluid analysis. Started on colchicine by ID on 11/04/2020.  Generalized weakness affecting upper extremities -CT head done on 11/01/2020, nonacute. CT cervical spine showing erosive changes of the dens and severe right C2-3 facet arthrosis Discussed with neurosurgery, [name] on 11/02/2020, recommended pain control and follow-up outpatient. Continue PT OT as tolerated and with assistance.  Persistent recurrent fevers and tachycardia Discussed with ID Possibly multifactorial, possibly associated with wounds versus others ID recommended to continue to hold off antibiotics due to overall clinical stability. T-max 101.9.  Persistent tachycardia likely secondary to recurrent fevers. He is on metoprolol 25 mg  twice daily Continue to hydrate via core track Antipyretics as needed Patient is not hypoxic with O2 saturation 100% on room air. Blood cultures negative to  date. Pharmacological DVT prophylaxis in place.  Erosive changes of the dens/severe right C2-3 facet arthrosis Curb sided with neurosurgery, [name] on 11/02/2020, recommended pain control and follow-up outpatient.  Subclinical hypothyroidism. TSH greater than 7, free T4 normal 0.90 Repeat TSH in 6 to 8 weeks or when acute illness has resolved  Moderatecalorie protein malnutrition Poor oral intake - Continue with nutritional supplements, multivitamins,and thiamine. -Continue dysphagia diet; not taking in much nutrition and at risk for further decline/complications; ongoing Hume with daughter; PCM assisting discussions - agree with trial of mirtazapine - Cortrak placed on 4/15 and TF initiated, clamped on 11/01/2020 for calorie counts Restarted core track tube feeding on 11/02/2020 as the patient had minimal oral intake.  Resolved post with patient: Hypomagnesemia Continue replacement via core track tube feeding.  Resolved hypovolemic shock with AKI/ATN Resolved hypernatremia - 172 on admission Resolved AGMA 2/2 renal failure - s/p volume resuscitation on admission followed by CRRT - renal function now normalized  Sepsis 2/2 wounds (not present on admission). Patient with complex wounds, left hemithorax and left thigh - s/p abx course x 5 days Continue local wound care Continue to improve nutritional status.  HTN. BP is currently at goal  -Continue Lopressor Continue to monitor vital signs.  Alcohol abuse Out of window for withdrawal. - continue folic, thiamine, MVI - PRN benzo  Aspiration pneumonitis (present on admission)  Continue with aspiration precautions. Keep head of bed elevated greater than 30 degrees during feedings. O2 saturation 97% on room air.  Ambulatory dysfunction PT OT assessed and recommending SNF Also recommended DME wheelchair and hospital bed, ordered. TOC consulted to assist with SNF placement. Out of bed to chair with  assistance Mobilize as tolerated with assistance. Fall precaution  Goals of care Generalized weakness, multiple decubitus ulcers, dysphagia requiring tube feedings, failure to thrive in an adult. Palliative care following, appreciate assistance. Currently the patient is full code. Goals of care discussion with family and attending plan for Wednesday, 11/06/2020.   Antimicrobials: Zosyn 4/3 - 4/4 Rocephin 4/10 >> 4/15 Vanc 4/10 >> 4/15  DVT prophylaxis: enoxaparin (LOVENOX) injection 40 mg Start: 10/17/20 1530 SCDs Start: 10/12/20 2213   Code Status:   Code Status: Full Code Family Communication:  Updated his daughter Raymond Stone via phone  Disposition Plan: Status is: Inpatient  Remains inpatient appropriate because:IV treatments appropriate due to intensity of illness or inability to take PO and Inpatient level of care appropriate due to severity of illness   Dispo: The patient is from: Home  Anticipated d/c is to: SNF  Patient currently is not medically stable to d/c.              Difficult to place patient No      Objective: Vitals:   11/05/20 1010 11/05/20 1025 11/05/20 1125 11/05/20 1237  BP: 110/71 (!) 107/59 116/66   Pulse: (!) 107 (!) 108 (!) 102   Resp: 17 16 17    Temp: (!) 100.7 F (38.2 C)  (!) 100.8 F (38.2 C) (!) 100.7 F (38.2 C)  TempSrc: Oral  Oral   SpO2: 100% 100% 100%   Weight:      Height:        Intake/Output Summary (Last 24 hours) at 11/05/2020 1254 Last data filed at 11/05/2020 1021 Gross per 24 hour  Intake 2932.83 ml  Output 1550  ml  Net 1382.83 ml   Filed Weights   10/31/20 0419 11/04/20 0435 11/05/20 0407  Weight: 73 kg 68 kg 68 kg    Exam:  . General: 70 y.o. year-old male chronically ill-appearing no acute distress.  He is alert and oriented x3.   . Cardiovascular: Tachycardic with no rubs or gallops.   Marland Kitchen Respiratory: Clear to auscultation no wheezes no rales.   . Abdomen: Soft nontender  normal bowel sounds present.   . Musculoskeletal: Trace lower extremity edema bilaterally.   . Skin: Diffuse pressure wounds in lower extremities bilaterally.   Marland Kitchen Psychiatry: Mood is appropriate for condition and setting.  Data Reviewed: CBC: Recent Labs  Lab 10/30/20 0304 10/30/20 1828 10/31/20 0310 11/01/20 0219 11/02/20 0300 11/03/20 0659 11/04/20 0809  WBC 9.7  --  8.7 7.7 8.0 9.1 8.9  NEUTROABS 6.6  --  5.9 5.1 4.9 6.1  --   HGB 7.7*   < > 7.9* 7.4* 8.5* 7.0* 7.5*  HCT 24.0*   < > 23.7* 22.9* 26.2* 21.9* 23.1*  MCV 96.8  --  95.2 95.0 94.6 96.1 93.9  PLT 379  --  356 342 393  --  531*   < > = values in this interval not displayed.   Basic Metabolic Panel: Recent Labs  Lab 10/30/20 0304 10/31/20 0310 11/01/20 0219 11/02/20 0300 11/03/20 0659 11/04/20 0809  NA 131* 130* 130* 132* 132* 131*  K 4.6 4.7 4.4 4.4 5.1 4.5  CL 98 97* 96* 97* 101 97*  CO2 27 26 24 26  21* 26  GLUCOSE 123* 121* 132* 96 138* 130*  BUN 26* 27* 29* 36* 35* 28*  CREATININE 0.81 0.80 0.82 0.92 0.89 0.70  CALCIUM 9.9 9.7 9.5 10.1 9.9 10.0  MG 1.5* 1.8 1.7 1.9 1.8  --   PHOS 4.1 4.1 4.7* 6.4* 4.0  --    GFR: Estimated Creatinine Clearance: 83.8 mL/min (by C-G formula based on SCr of 0.7 mg/dL). Liver Function Tests: Recent Labs  Lab 11/03/20 0659  AST 35  ALT 31  ALKPHOS 126  BILITOT 0.4  PROT 7.1  ALBUMIN 1.6*   No results for input(s): LIPASE, AMYLASE in the last 168 hours. No results for input(s): AMMONIA in the last 168 hours. Coagulation Profile: No results for input(s): INR, PROTIME in the last 168 hours. Cardiac Enzymes: No results for input(s): CKTOTAL, CKMB, CKMBINDEX, TROPONINI in the last 168 hours. BNP (last 3 results) No results for input(s): PROBNP in the last 8760 hours. HbA1C: No results for input(s): HGBA1C in the last 72 hours. CBG: Recent Labs  Lab 11/04/20 2016 11/04/20 2348 11/05/20 0454 11/05/20 0832 11/05/20 1242  GLUCAP 114* 127* 123* 123* 122*    Lipid Profile: No results for input(s): CHOL, HDL, LDLCALC, TRIG, CHOLHDL, LDLDIRECT in the last 72 hours. Thyroid Function Tests: No results for input(s): TSH, T4TOTAL, FREET4, T3FREE, THYROIDAB in the last 72 hours. Anemia Panel: No results for input(s): VITAMINB12, FOLATE, FERRITIN, TIBC, IRON, RETICCTPCT in the last 72 hours. Urine analysis:    Component Value Date/Time   COLORURINE YELLOW 10/20/2020 0815   APPEARANCEUR HAZY (A) 10/20/2020 0815   LABSPEC 1.020 10/20/2020 0815   PHURINE 5.0 10/20/2020 0815   GLUCOSEU >=500 (A) 10/20/2020 0815   HGBUR SMALL (A) 10/20/2020 0815   BILIRUBINUR NEGATIVE 10/20/2020 0815   KETONESUR 5 (A) 10/20/2020 0815   PROTEINUR 30 (A) 10/20/2020 0815   NITRITE NEGATIVE 10/20/2020 0815   LEUKOCYTESUR NEGATIVE 10/20/2020 0815   Sepsis Labs: @  LABRCNTIP(procalcitonin:4,lacticidven:4)  ) Recent Results (from the past 240 hour(s))  Culture, blood (routine x 2)     Status: None   Collection Time: 10/27/20  1:22 PM   Specimen: BLOOD  Result Value Ref Range Status   Specimen Description BLOOD RIGHT ANTECUBITAL  Final   Special Requests   Final    BOTTLES DRAWN AEROBIC ONLY Blood Culture adequate volume   Culture   Final    NO GROWTH 5 DAYS Performed at [facility], [address], [place], [place] [address]    Report Status 11/01/2020 FINAL  Final  Culture, blood (routine x 2)     Status: None   Collection Time: 10/27/20  1:22 PM   Specimen: BLOOD  Result Value Ref Range Status   Specimen Description BLOOD LEFT ANTECUBITAL  Final   Special Requests   Final    BOTTLES DRAWN AEROBIC ONLY Blood Culture adequate volume   Culture   Final    NO GROWTH 5 DAYS Performed at [facility], [place] [address], [place], [place] [address]    Report Status 11/01/2020 FINAL  Final  Culture, blood (routine x 2)     Status: None (Preliminary result)   Collection Time: 11/01/20  6:10 PM   Specimen: BLOOD  Result Value Ref Range Status    Specimen Description BLOOD LEFT ANTECUBITAL  Final   Special Requests   Final    BOTTLES DRAWN AEROBIC AND ANAEROBIC Blood Culture adequate volume   Culture   Final    NO GROWTH 4 DAYS Performed at [facility], [place] [address], [place], [place] [address]    Report Status PENDING  Incomplete  Culture, blood (routine x 2)     Status: None (Preliminary result)   Collection Time: 11/01/20  6:20 PM   Specimen: BLOOD LEFT HAND  Result Value Ref Range Status   Specimen Description BLOOD LEFT HAND  Final   Special Requests   Final    BOTTLES DRAWN AEROBIC AND ANAEROBIC Blood Culture adequate volume   Culture   Final    NO GROWTH 4 DAYS Performed at [facility], [place] [address], [place], [place] [address]    Report Status PENDING  Incomplete  MRSA PCR Screening     Status: None   Collection Time: 11/01/20  7:00 PM   Specimen: Nasal Mucosa; Nasopharyngeal  Result Value Ref Range Status   MRSA by PCR NEGATIVE NEGATIVE Final    Comment:        The GeneXpert MRSA Assay (FDA approved for NASAL specimens only), is one component of a comprehensive MRSA colonization surveillance program. It is not intended to diagnose MRSA infection nor to guide or monitor treatment for MRSA infections. Performed at [facility], [place] [address], [place], [place] [address]       Studies: No results found.  Scheduled Meds: . bupivacaine  10 mL Infiltration Once  . colchicine  0.6 mg Oral BID  . collagenase   Topical Daily  . enoxaparin (LOVENOX) injection  40 mg Subcutaneous Q24H  . feeding supplement  237 mL Oral TID WC & HS  . feeding supplement (PROSource TF)  45 mL Per Tube BID  . folic acid  1 mg Per Tube Daily  . free water  200 mL Per Tube Q6H  . magnesium oxide  400 mg Per Tube BID  . mouth rinse  15 mL Mouth Rinse BID  . methylPREDNISolone acetate  40 mg  Intra-articular Once  . metoprolol tartrate  25 mg Per Tube BID  . mirtazapine  15 mg Oral QHS  . multivitamin  15  mL Per Tube Daily  . sennosides  5 mL Per Tube QHS  . thiamine  100 mg Per Tube Daily    Continuous Infusions: . sodium chloride 1,000 mL (10/21/20 1514)  . feeding supplement (OSMOLITE 1.5 CAL) 65 mL/hr at 11/05/20 0500     LOS: 24 days     Kayleen Memos, MD [facility] Pager [phone]  If 7PM-7AM, please contact night-coverage [email or web address] Password [facility] 11/05/2020, 12:54 PM

## 2020-11-05 NOTE — Progress Notes (Signed)
Physical Therapy Treatment Patient Details Name: Raymond Stone MRN: [id number] DOB: 1950/11/24 Today's Date: 11/05/2020    History of Present Illness Pt is a 70 y/o male admitted 10/12/20 after being found down for unkown length of time at home (apparently pt had not been seen for ~5 days). Workup for several pressure injuries, hypovolemic shock complicated by acute kidney injury. Underwent CRRT 4/3. Poor oral intake s/p cortrak 4/15. Pt with L knee effusion s/p aspiration and injection 4/26; aspiration positive for crystals. PMH includes HTN, alcohol dependence, malnutrition.   PT Comments    Pt limited by multiple, near-constant bouts of watery diarrhea today (RN reports bowel incontinence every ~15-min today); OOB mobility and maximove lift trial deferred secondary to this. Pt continues to require max-totalA for bed-level mobility, dependent for pericare/washup. Pt remains disoriented, despite multiple attempts to reorient, pt does not recall information; pt with decreased attention and inconsistent command following. Noted plans for family meeting with Palliative Care tomorrow. Will await decisions from this meeting and follow-up as appropriate for continued PT services.     Follow Up Recommendations  SNF;Supervision/Assistance - 24 hour     Equipment Recommendations  Wheelchair;Wheelchair cushion;Hospital bed;Mechanical lift   Recommendations for Other Services       Precautions / Restrictions Precautions Precautions: Fall Precaution Comments: Frequent bouts of bowel incontinence; multiple wounds; cortrak Other Brace: bilateral prevalon boots    Mobility  Bed Mobility Overal bed mobility: Needs Assistance Bed Mobility: Rolling Rolling: Max assist;+2 for safety/equipment         General bed mobility comments: MaxA for multiple rolls R/L for pericare and linen change due to multiple bouts ofi ncontinence; maxA+1 to roll towards R-side, +2 needed rolling onto L-side as pt  resisting roll with trunk/hip extension; pt requires assist to bring UE to bed rail, able to follow some verbal instructions with this; totalA to scoot up in bed; pt left positioned onto R-side for pressure relief with assist from nursing    Transfers                    Ambulation/Gait                 Stairs             Wheelchair Mobility    Modified Rankin (Stroke Patients Only)       Balance                                            Cognition Arousal/Alertness: Awake/alert Behavior During Therapy: Flat affect Overall Cognitive Status: Impaired/Different from baseline Area of Impairment: Orientation;Memory;Following commands;Awareness;Safety/judgement;Problem solving                 Orientation Level: Disoriented to;Place;Time;Situation Current Attention Level: Sustained;Selective Memory: Decreased recall of precautions;Decreased short-term memory Following Commands: Follows one step commands with increased time;Follows one step commands inconsistently Safety/Judgement: Decreased awareness of safety;Decreased awareness of deficits Awareness: Intellectual Problem Solving: Slow processing;Decreased initiation;Difficulty sequencing;Requires verbal cues;Requires tactile cues General Comments: Upon PT's introduction, pt asks, "Have you been out to one of my places yet?" - when asked current location, pt states, "Somewhere in [place]... in the place..." - reoriented to current location, pt continues to talk about his properties and needing to visit sister, etc. Unable to recall current location later in session.      Exercises Other Exercises Other Exercises:  AAROM shoulder flex/abd; AROM flex ext/flex    General Comments General comments (skin integrity, edema, etc.): OOB mobility deferred, including trial with maximove lift, due to near-constant bouts of watery, diarrhea (RN reports bowel incontinence every ~15-min today)       Pertinent Vitals/Pain Pain Assessment: Faces Faces Pain Scale: Hurts little more Pain Location: Generalized Pain Descriptors / Indicators: Moaning;Grimacing;Guarding Pain Intervention(s): Monitored during session;Repositioned    Home Living                      Prior Function            PT Goals (current goals can now be found in the care plan section) Progress towards PT goals: Not progressing toward goals - comment (limited by incontinence, pain, cognition)    Frequency    Min 2X/week      PT Plan Current plan remains appropriate    Co-evaluation              AM-PAC PT "6 Clicks" Mobility   Outcome Measure  Help needed turning from your back to your side while in a flat bed without using bedrails?: A Lot Help needed moving from lying on your back to sitting on the side of a flat bed without using bedrails?: Total Help needed moving to and from a bed to a chair (including a wheelchair)?: Total Help needed standing up from a chair using your arms (e.g., wheelchair or bedside chair)?: Total Help needed to walk in hospital room?: Total Help needed climbing 3-5 steps with a railing? : Total 6 Click Score: 7    End of Session   Activity Tolerance: Treatment limited secondary to medical complications (Comment) (frequent bouts of bowel incontinence) Patient left: in bed;with call bell/phone within reach;with bed alarm set Nurse Communication: Mobility status;Need for lift equipment PT Visit Diagnosis: History of falling (Z91.81);Muscle weakness (generalized) (M62.81);Adult, failure to thrive (R62.7)     Time: 6761-9509 PT Time Calculation (min) (ACUTE ONLY): 20 min  Charges:  $Therapeutic Activity: 8-22 mins                     Mabeline Caras, PT, DPT Acute Rehabilitation Services  Pager [phone] Office [place] 11/05/2020, 4:07 PM

## 2020-11-05 NOTE — Progress Notes (Signed)
[facility] for Infectious Disease  Date of Admission:  10/12/2020     CC: fever  Lines: Peripheral iv's  Abx: 04/10-04/15 vanc 04/10-04/15 ceftriaxone  04/02-04/06 vanc 04/02-04/06 piptazo  ASSESSMENT: Fever Polyarthralgia History of gout Sirs/sepsis   70 yo male alcoholic, hx gout, htn, admitted 4/02 with sepsis after found down on the floor for 5 days not being seen course complicated by hypovolumic shock (on initial admission), aki brief CRRT requirement (ck <2500). He has had intermittent high fever this admission of unclear etiology, and despite bsAbx. Initially with leukocytosis but subsequent febrile episodes is without leukocytosis.   Previous w/u including bcx over several days, tte, cxr, ct abd pelv (no contrast), all unremarkable. He has had no indwelling central lines. Last blood cx 4/17 and 4/22 negative Initial admission hepatitis serology negative  Current thought on initial id evaluation potential aspiration pneumonitis. He has had ngt for feeding/dysphagia  Today reported tenderness initially left knee when I tried to look for rash -- subsequently determitned to be polyarthritis/arthralgia of the left knee, right shoulder, bilateral elbows/ankles. He does have hx gout. I query polyarticular gout.   He also has multiple desquamation on trunk/palm and some pressure related eschar left lateral knee as well as smaller lesions heels. He also has some wound left chest wall that is well packed and showed no purulence. No obvious cellulitic changes. I do not believe he has strep/staph toxin syndrome recently. 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 No other id w/u needed at this time  PLAN: 1. Appreciate ortho help 2. Continue polyarticular gout management -- will defer to primary team  3.   ID will sign off  Active Problems:   Essential hypertension   AKI (acute kidney injury) ([place])   Alcohol dependence (HCC)   Pressure injury of skin   Malnutrition of moderate degree   Hypernatremia   Hyperkalemia   Acute metabolic encephalopathy   ATN (acute tubular necrosis) (HCC)   Cerebral atrophy (HCC)   Failure to thrive in adult   Fever, unknown origin   Polyarthralgia   History of gout   Allergies  Allergen Reactions  . Lisinopril Swelling    ANGIOEDEMA    Scheduled Meds: . colchicine  0.6 mg Oral BID  . collagenase   Topical Daily  . enoxaparin (LOVENOX) injection  40 mg Subcutaneous Q24H  . feeding supplement  237 mL Oral TID WC & HS  . feeding supplement (PROSource TF)  45 mL Per Tube BID  . folic acid  1 mg Per Tube Daily  . free water  200 mL Per Tube Q6H  . magnesium oxide  400 mg Per Tube BID  . mouth rinse  15 mL Mouth Rinse BID  . metoprolol tartrate  25 mg Per Tube BID  . mirtazapine  15 mg Oral QHS  . multivitamin  15 mL Per Tube Daily  . sennosides  5 mL Per Tube QHS  . thiamine  100 mg Per  Tube Daily   Continuous Infusions: . sodium chloride 1,000 mL (10/21/20 1514)  . feeding supplement (OSMOLITE 1.5 CAL) 65 mL/hr at 11/05/20 0500   PRN Meds:.sodium chloride, acetaminophen (TYLENOL) oral liquid 160 mg/5 mL, lip balm, LORazepam, ondansetron (ZOFRAN) IV, oxyCODONE, polyethylene glycol, polyvinyl alcohol, traMADol   SUBJECTIVE: Pain improving Fever curve improving Patient feels he overall feeling better Ask when he can leaves  No n/v/diarrhea No new rash   Joint pain seems to be improving  as well  Review of Systems: ROS All other ROS was negative, except mentioned above     OBJECTIVE: Vitals:   11/05/20 1255 11/05/20 1310 11/05/20 1325 11/05/20 1332  BP: 121/65 104/66 114/60   Pulse:      Resp:      Temp:    100.1 F (37.8 C)  TempSrc:    Oral  SpO2:      Weight:      Height:       Body mass index is 22.15 kg/m.  Physical Exam General/constitutional: no distress, pleasant; appear much more energetic today HEENT: Normocephalic, PER, Conj Clear, EOMI, Oropharynx clear Neck supple CV: rrr no mrg Lungs: clear to auscultation, normal respiratory effort Abd: Soft, Nontender Ext: no edema Neuro: nonfocal  Skin --> stable finding of following left chest wound with packing no surrounding cellulitis change and no purulence; eschar plaque lateral left knee, bilateral heels, some scattered desquamationon on trunk palms  MSK; decreased tenderness shoulders/elbow/knees/ankles. Increased passive rom on knees today before eliciting tenderness   Lab Results Lab Results  Component Value Date   WBC 8.9 11/04/2020   HGB 7.5 (L) 11/04/2020   HCT 23.1 (L) 11/04/2020   MCV 93.9 11/04/2020   PLT 531 (H) 11/04/2020    Lab Results  Component Value Date   CREATININE 0.70 11/04/2020   BUN 28 (H) 11/04/2020   NA 131 (L) 11/04/2020   K 4.5 11/04/2020   CL 97 (L) 11/04/2020   CO2 26 11/04/2020    Lab Results  Component Value Date   ALT 31 11/03/2020   AST 35 11/03/2020   ALKPHOS 126 11/03/2020   BILITOT 0.4 11/03/2020      Microbiology: Recent Results (from the past 240 hour(s))  Culture, blood (routine x 2)     Status: None   Collection Time: 10/27/20  1:22 PM   Specimen: BLOOD  Result Value Ref Range Status   Specimen Description BLOOD RIGHT ANTECUBITAL  Final   Special Requests   Final    BOTTLES DRAWN AEROBIC ONLY Blood Culture adequate volume   Culture   Final    NO GROWTH 5 DAYS Performed at [facility], [place] [address] [place], [place]  [address]    Report Status 11/01/2020 FINAL  Final  Culture, blood (routine x 2)     Status: None   Collection Time: 10/27/20  1:22 PM   Specimen: BLOOD  Result Value Ref Range Status   Specimen Description BLOOD LEFT ANTECUBITAL  Final   Special Requests   Final    BOTTLES DRAWN AEROBIC ONLY Blood Culture adequate volume   Culture   Final    NO GROWTH 5 DAYS Performed at [facility], [place] [address], [place], [place] [address]    Report Status 11/01/2020 FINAL  Final  Culture, blood (routine x 2)     Status: None (Preliminary result)   Collection Time: 11/01/20  6:10 PM   Specimen: BLOOD  Result Value Ref Range Status  Specimen Description BLOOD LEFT ANTECUBITAL  Final   Special Requests   Final    BOTTLES DRAWN AEROBIC AND ANAEROBIC Blood Culture adequate volume   Culture   Final    NO GROWTH 4 DAYS Performed at [facility], [address] [place], [place] [address]    Report Status PENDING  Incomplete  Culture, blood (routine x 2)     Status: None (Preliminary result)   Collection Time: 11/01/20  6:20 PM   Specimen: BLOOD LEFT HAND  Result Value Ref Range Status   Specimen Description BLOOD LEFT HAND  Final   Special Requests   Final    BOTTLES DRAWN AEROBIC AND ANAEROBIC Blood Culture adequate volume   Culture   Final    NO GROWTH 4 DAYS Performed at [facility], [place] [address] [place], [place] [address]    Report Status PENDING  Incomplete  MRSA PCR Screening     Status: None   Collection Time: 11/01/20  7:00 PM   Specimen: Nasal Mucosa; Nasopharyngeal  Result Value Ref Range Status   MRSA by PCR NEGATIVE NEGATIVE Final    Comment:        The GeneXpert MRSA Assay (FDA approved for NASAL specimens only), is one component of a comprehensive MRSA colonization surveillance program. It is not intended to diagnose MRSA infection nor to guide or monitor treatment for MRSA infections. Performed at [facility], [place] [address]  [place], [place] [address]      Serology: 4/26 left knee fluid  -monosodium urate crystal visualized; 3800 wbc  Imaging: If present, new imagings (plain films, ct scans, and mri) have been personally visualized and interpreted; radiology reports have been reviewed. Decision making incorporated into the Impression / Recommendations.  4/24 tte 1. Left ventricular ejection fraction, by estimation, is 60 to 65%. The  left ventricle has normal function. The left ventricle has no regional  wall motion abnormalities. There is mild left ventricular hypertrophy.  2. Right ventricular systolic function was not well visualized. The right  ventricular size is not well visualized.  3. The mitral valve is normal in structure. No evidence of mitral valve  regurgitation. No evidence of mitral stenosis.  4. The aortic valve is tricuspid. Aortic valve regurgitation is not  visualized. No aortic stenosis is present.     Jabier Mutton, [facility]ious [place] [phone] pager    11/05/2020, 5:33 PM

## 2020-11-05 NOTE — Progress Notes (Signed)
  Speech Language Pathology Treatment: Dysphagia  Patient Details Name: Raymond Stone MRN: [id number] DOB: 12-01-1950 Today's Date: 11/05/2020 Time: 0921-0930 SLP Time Calculation (min) (ACUTE ONLY): 9 min  Assessment / Plan / Recommendation Clinical Impression  Pt had an immediate cough x1 with a particularly large straw sip, but otherwise does not show overt s/s of aspiration. He does however continue to have very limited intake. With a single trial of advanced solids he had moderate amounts of anterior spillage, for which SLP provided verbal cues but pt ultimately pushed them out of his mouth and onto his gown. Pt then declined any further trials of food, even if they were in smaller/more chopped pieces that are more consistent with his current diet. Would continue Dys 2 solids and thin liquids. If pt continues to have limited engagement in swallowing tx, may need to adjust his frequency. Will also await results of [place] conversation, planned for next date with palliative care.    HPI HPI: 70 yo man with hx of HTN, lives alone, found down after not being seen for five days and admitted 4/2. Dx acute metabolic encephalopathy, acute kidney injury, hypovolemic shock, rhabdomylosis. BSE completed on 4/8 and after diet check by SLP, patient was discharged from speech services because of tolerating regular solids, thin liquids. BSE reordered on 4/11 as nursing observing patient to cough frequently with even smal sips of water.      SLP Plan  Continue with current plan of care       Recommendations  Diet recommendations: Dysphagia 2 (fine chop);Thin liquid Liquids provided via: Cup;Straw Medication Administration: Whole meds with puree Supervision: Full supervision/cueing for compensatory strategies;Staff to assist with self feeding Compensations: Slow rate;Small sips/bites Postural Changes and/or Swallow Maneuvers: Seated upright 90 degrees                Oral Care Recommendations: Oral  care QID Follow up Recommendations: Skilled Nursing facility;24 hour supervision/assistance SLP Visit Diagnosis: Dysphagia, unspecified (R13.10) Plan: Continue with current plan of care       GO                Osie Bond., M.A. [facility] Pager [phone] Office [phone]  11/05/2020, 9:36 AM
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
## 2020-11-05 NOTE — Progress Notes (Addendum)
Pt would only agree to wound care on lateral left thigh. Wound care performed.

## 2020-11-05 NOTE — Progress Notes (Signed)
[facility]) Hospital Liaison note:  This is a new patient referred to [facility] for Palliative Care services. Will continue to follow for disposition.  Please call with any outpatient palliative questions or concerns.  Thank you, Lorelee Market, LPN [facility] Liaison [phone]

## 2020-11-05 NOTE — Care Management Important Message (Signed)
Important Message  Patient Details  Name: Raymond Stone MRN: [id number] Date of Birth: 1950-09-19   Medicare Important Message Given:  Yes     Shelda Altes 11/05/2020, 8:20 AM

## 2020-11-05 NOTE — Procedures (Addendum)
Procedure: Left knee aspiration and injection  Indication: Left knee effusion(s)  Surgeon: Silvestre Gunner, PA-C  Assist: None  Anesthesia: Topical refrigerant  EBL: None  Complications: None  Findings: After risks/benefits explained patient desires to undergo procedure. Consent obtained and time out performed. The left knee was sterilely prepped and aspirated. 54ml clear yellow fluid obtained. 45ml 0.5% Marcaine and 40mg  depomedrol instilled. Pt tolerated the procedure well.    Lisette Abu, PA-C Orthopedic Surgery [phone]   Addendum:  Aspirate from the L knee was positive for crystals.  Cell count was 4k.  Steroid injection given at the time of aspiration.  This should improve patients gout symptoms.  No surgical indication.  Ortho signing off.  Please call the number above or [phone] with any questions.

## 2020-11-06 DIAGNOSIS — I214 Non-ST elevation (NSTEMI) myocardial infarction: Secondary | ICD-10-CM

## 2020-11-06 DIAGNOSIS — A419 Sepsis, unspecified organism: Secondary | ICD-10-CM

## 2020-11-06 DIAGNOSIS — R6521 Severe sepsis with septic shock: Secondary | ICD-10-CM

## 2020-11-06 LAB — CULTURE, BLOOD (ROUTINE X 2)
Culture: NO GROWTH
Culture: NO GROWTH
Special Requests: ADEQUATE
Special Requests: ADEQUATE

## 2020-11-06 LAB — CBC
HCT: 21.2 % — ABNORMAL LOW (ref 39.0–52.0)
Hemoglobin: 6.9 g/dL — CL (ref 13.0–17.0)
MCH: 30.5 pg (ref 26.0–34.0)
MCHC: 32.5 g/dL (ref 30.0–36.0)
MCV: 93.8 fL (ref 80.0–100.0)
Platelets: 663 10*3/uL — ABNORMAL HIGH (ref 150–400)
RBC: 2.26 MIL/uL — ABNORMAL LOW (ref 4.22–5.81)
RDW: 14.5 % (ref 11.5–15.5)
WBC: 14.4 10*3/uL — ABNORMAL HIGH (ref 4.0–10.5)
nRBC: 0 % (ref 0.0–0.2)

## 2020-11-06 LAB — BASIC METABOLIC PANEL
Anion gap: 10 (ref 5–15)
BUN: 34 mg/dL — ABNORMAL HIGH (ref 8–23)
CO2: 26 mmol/L (ref 22–32)
Calcium: 9.5 mg/dL (ref 8.9–10.3)
Chloride: 96 mmol/L — ABNORMAL LOW (ref 98–111)
Creatinine, Ser: 0.75 mg/dL (ref 0.61–1.24)
GFR, Estimated: >60 mL/min (ref >60)
Glucose, Bld: 138 mg/dL — ABNORMAL HIGH (ref 70–99)
Potassium: 4.6 mmol/L (ref 3.5–5.1)
Sodium: 132 mmol/L — ABNORMAL LOW (ref 135–145)

## 2020-11-06 LAB — GLUCOSE, CAPILLARY
Glucose-Capillary: 147 mg/dL — ABNORMAL HIGH (ref 70–99)
Glucose-Capillary: 136 mg/dL — ABNORMAL HIGH (ref 70–99)
Glucose-Capillary: 129 mg/dL — ABNORMAL HIGH (ref 70–99)

## 2020-11-06 MED ORDER — ONDANSETRON 4 MG PO TBDP
4.0000 mg | ORAL_TABLET | Freq: Four times a day (QID) | ORAL | Status: DC | PRN
  Filled 2020-11-06: qty 1

## 2020-11-06 MED ORDER — TRIAMCINOLONE 0.1 % CREAM:EUCERIN CREAM 1:1
TOPICAL_CREAM | Freq: Three times a day (TID) | CUTANEOUS | Status: DC
  Filled 2020-11-06 (×3): qty 1

## 2020-11-06 MED ORDER — GLYCOPYRROLATE 0.2 MG/ML IJ SOLN: 0.2000 mg | INTRAMUSCULAR | Status: DC | PRN

## 2020-11-06 MED ORDER — MORPHINE SULFATE (PF) 2 MG/ML IV SOLN: 1.0000 mg | INTRAVENOUS | Status: DC | PRN

## 2020-11-06 MED ORDER — HALOPERIDOL LACTATE 2 MG/ML PO CONC
0.5000 mg | ORAL | Status: DC | PRN
  Filled 2020-11-06: qty 0.3

## 2020-11-06 MED ORDER — GLYCOPYRROLATE 1 MG PO TABS
1.0000 mg | ORAL_TABLET | ORAL | Status: DC | PRN
  Filled 2020-11-06: qty 1

## 2020-11-06 MED ORDER — ACETAMINOPHEN 160 MG/5ML PO SOLN
650.0000 mg | ORAL | Status: DC | PRN
  Administered 2020-11-07: 650 mg via ORAL
  Filled 2020-11-06: qty 20.3

## 2020-11-06 MED ORDER — HALOPERIDOL LACTATE 5 MG/ML IJ SOLN: 0.5000 mg | INTRAMUSCULAR | Status: DC | PRN

## 2020-11-06 MED ORDER — LORAZEPAM 2 MG/ML PO CONC: 1.0000 mg | ORAL | Status: DC | PRN

## 2020-11-06 MED ORDER — LORAZEPAM 2 MG/ML IJ SOLN
1.0000 mg | INTRAMUSCULAR | Status: DC | PRN
  Administered 2020-11-07: 1 mg via INTRAVENOUS
  Filled 2020-11-06: qty 1

## 2020-11-06 MED ORDER — HALOPERIDOL 0.5 MG PO TABS
0.5000 mg | ORAL_TABLET | ORAL | Status: DC | PRN
  Filled 2020-11-06: qty 1

## 2020-11-06 MED ORDER — LORAZEPAM 1 MG PO TABS: 1.0000 mg | ORAL_TABLET | ORAL | Status: DC | PRN

## 2020-11-06 MED ORDER — ONDANSETRON HCL 4 MG/2ML IJ SOLN: 4.0000 mg | Freq: Four times a day (QID) | INTRAMUSCULAR | Status: DC | PRN

## 2020-11-06 NOTE — TOC Progression Note (Signed)
Transition of Care [facility]) - Progression Note    Patient Details  Name: Raymond Stone MRN: [id number] Date of Birth: July 08, 1951  Transition of Care [facility]) CM/SW [place], [place] Phone Number: 11/06/2020, 11:52 AM  Clinical Narrative:     CSW received consult for residential hospice for patient at [facility]. CSW called and spoke with [name]. Governor Specking confirmed that she would like CSW to make referral to [facility] for [facility] place for patient. CSW spoke with Bevely Palmer with [facility] and made referral for [facility] for patient. Bevely Palmer confirmed no bed availability today.CSW will continue to follow and assist with dc planning needs.    Expected Discharge Plan: [place] Barriers to Discharge: Continued Medical Work up  Expected Discharge Plan and Services Expected Discharge Plan: [place] In-house Referral: Clinical Social Work     Living arrangements for the past 2 months: Single Family Home                                       Social Determinants of Health (SDOH) Interventions    Readmission Risk Interventions No flowsheet data found.

## 2020-11-06 NOTE — Progress Notes (Signed)
[facility]  PROGRESS NOTE  Raymond Stone [id number] DOB: 10-Jan-1951 DOA: 10/12/2020 PCP: [name], NP   Brief HPI:   70 year old male with medical history of hypertension who was found down at home and was not seen for 5 days.  When EMS arrived patient was severely hypertensive with temperature of 100 F.  Per patient's daughter and sister patient has been fully functional prior to this, was ambulating and was independent with all ADLs.  Admission CT head was nonacute, repeat CT head was also unremarkable.  Patient was found to have severe hypernatremia with sodium 172, severe hyperkalemia with potassium 7.3, BUN 211, creatinine 11.2, elevated troponin 489, CPK greater than 2400 on admission.  He was admitted to ICU and started on CRRT on 10/13/2020 with improvement in kidney function.  CPK peaked at 2400. CT abdomen/pelvis on 10/23/2020 showed no acute intra-abdominal pathology. Patient continued to have poor p.o. intake so core track feeding tube was placed on 10/25/2020 and tube feed was initiated.  Feeding tube was clamped on 11/01/2020 for calorie count, patient was not able to take adequate amount of oral nutrition so feeding tube was restarted on 11/02/2020. CT cervical spine on 11/01/2020 showed erosive changes of the dens/severe right C2-3 facet arthrosis.  Neurosurgery recommended outpatient follow-up.    Subjective   Patient seen and examined, denies any complaints.   Assessment/Plan:     1. Metabolic encephalopathy-patient presented with metabolic encephalopathy likely from uremia, rhabdomyolysis, dehydration, acute kidney injury.  Patient was started on hemodialysis, currently metabolic encephalopathy has significantly improved.  Still not back to baseline.  Patient also has significant history of alcohol abuse. 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   Patient has been started on colchicine.  3. Generalized weakness affecting upper extremities-CT head on 11/01/20 was unremarkable.  CT cervical spine showed erosive changes of dens and severe right C2-C3 facet arthrosis.  [name] discussed with neurosurgeon [name] on 11/02/2020, he recommended pain control and follow-up as outpatient. 4. Persistent recurrent fever and tachycardia-ID recommended to hold off antibiotics and observe. 5. Moderate protein calorie malnutrition-core track feeding tube was placed on 4/15 and tube feeding was started.  Tube feeds was clamped on 11/01/2020 for calorie count.  Restarted core track tube feeding on 11/02/2020 as patient had minimal oral intake. 6. Complex wounds-patient has complex wounds on left hemithorax and left thigh.  Wound care was consulted.  He received antibiotics for 5 days. 7. Goals of care-goals of care meeting was done with palliative care provider as well as patient's sisters and daughter on phone.  At this time whole family is in agreement of not inserting artificial tube feeding as this was patient's wish.  Tube feed will be stopped, discontinue NG tube.  Comfort feeds will be started.  Family is agreeable to explore option of residential hospice.   Scheduled medications:   . colchicine  0.6 mg Oral BID  . collagenase   Topical Daily  . mouth rinse  15 mL Mouth Rinse BID  . mirtazapine  15 mg Oral QHS  . triamcinolone 0.1 % cream : eucerin   Topical TID         Data Reviewed:   CBG:  Recent Labs  Lab 11/05/20 1657 11/05/20 1950 11/05/20 2345 11/06/20 0358 11/06/20 0813  GLUCAP 147* 139* 154* 136* 129*    SpO2: 100 % O2 Flow Rate (L/min): 1 L/min  Vitals:   11/06/20 0031 11/06/20 0359 11/06/20 0554 11/06/20 0805  BP: 118/65 114/67  105/72  Pulse:  (!) 111  (!) 102  Resp: 18 16  18   Temp:  99.1 F (37.3 C)  98.7 F (37.1 C)  TempSrc:  Oral  Oral  SpO2:  100%  100%  Weight:   68 kg   Height:          Intake/Output Summary (Last 24 hours) at 11/06/2020 1628 Last data filed at 11/06/2020 1200 Gross per 24 hour  Intake 0 ml  Output 1075 ml  Net -1075 ml    04/25 1901 - 04/27 0700 In: 1991.4 [P.O.:120] Out: 1675 [Urine:1675]  Filed Weights   11/04/20 0435 11/05/20 0407 11/06/20 0554  Weight: 68 kg 68 kg 68 kg    CBC:  Recent Labs  Lab 10/31/20 0310 11/01/20 0219 11/02/20 0300 11/03/20 0659 11/04/20 0809 11/06/20 0430  WBC 8.7 7.7 8.0 9.1 8.9 14.4*  HGB 7.9* 7.4* 8.5* 7.0* 7.5* 6.9*  HCT 23.7* 22.9* 26.2* 21.9* 23.1* 21.2*  PLT 356 342 393  --  531* 663*  MCV 95.2 95.0 94.6 96.1 93.9 93.8  MCH 31.7 30.7 30.7 30.7 30.5 30.5  MCHC 33.3 32.3 32.4 32.0 32.5 32.5  RDW 14.4 14.2 14.4 14.6 14.2 14.5  LYMPHSABS 1.2 1.1 1.3 1.2  --   --   MONOABS 0.9 0.8 1.1* 1.3*  --   --   EOSABS 0.5 0.5 0.6* 0.5  --   --   BASOSABS 0.0 0.0 0.1 0.0  --   --     Complete metabolic panel:  Recent Labs  Lab 10/31/20 0310 11/01/20 0219 11/01/20 1805 11/02/20 0300 11/03/20 0659 11/04/20 0809 11/06/20 0430  NA 130* 130*  --  132* 132* 131* 132*  K 4.7 4.4  --  4.4 5.1 4.5 4.6  CL 97* 96*  --  97* 101 97* 96*  CO2 26 24  --  26 21* 26 26  GLUCOSE 121* 132*  --  96 138* 130* 138*  BUN 27* 29*  --  36* 35* 28* 34*  CREATININE 0.80 0.82  --  0.92 0.89 0.70 0.75  CALCIUM 9.7 9.5  --  10.1 9.9 10.0 9.5  AST  --   --   --   --  35  --   --   ALT  --   --   --   --  31  --   --   ALKPHOS  --   --   --   --  126  --   --   BILITOT  --   --   --   --  0.4  --   --   ALBUMIN  --   --   --   --  1.6*  --   --   MG 1.8 1.7  --  1.9 1.8  --   --   PROCALCITON  --   --  0.30  --  0.33  --   --   TSH 7.661*  --   --   --   --   --   --   BNP  --   --   --   --  33.2  --   --     No results for input(s): LIPASE, AMYLASE in the last 168 hours.  Recent Labs  Lab 11/01/20 1805 11/03/20 0659  BNP  --  33.2  PROCALCITON 0.30 0.33     ------------------------------------------------------------------------------------------------------------------  No results for input(s): CHOL, HDL, LDLCALC, TRIG, CHOLHDL, LDLDIRECT in the last 72 hours.  Lab Results  Component Value Date   HGBA1C 5.3 01/23/2020   ------------------------------------------------------------------------------------------------------------------ No results for input(s): TSH, T4TOTAL, T3FREE, THYROIDAB in the last 72 hours.  Invalid input(s): FREET3 ------------------------------------------------------------------------------------------------------------------ No results for input(s): VITAMINB12, FOLATE, FERRITIN, TIBC, IRON, RETICCTPCT in the last 72 hours.  Coagulation profile  No results for input(s): INR, PROTIME in the last 168 hours.  No results for input(s): DDIMER in the last 72 hours.  Cardiac Enzymes  No results for input(s): CKMB, TROPONINI, MYOGLOBIN in the last 168 hours.  Invalid input(s): CK ------------------------------------------------------------------------------------------------------------------    Component Value Date/Time   BNP 33.2 11/03/2020 0659   BNP 128.8 (H) 01/21/2016 1021     Antibiotics: Anti-infectives (From admission, onward)   Start     Dose/Rate Route Frequency Ordered Stop   10/24/20 1200  vancomycin (VANCOREADY) IVPB 1000 mg/200 mL  Status:  Discontinued        1,000 mg 200 mL/hr over 60 Minutes Intravenous Every 24 hours 10/23/20 1411 10/25/20 1046   10/21/20 1200  vancomycin (VANCOREADY) IVPB 1500 mg/300 mL  Status:  Discontinued        1,500 mg 150 mL/hr over 120 Minutes Intravenous Every 24 hours 10/20/20 1129 10/23/20 1411   10/20/20 1215  cefTRIAXone (ROCEPHIN) 2 g in sodium chloride 0.9 % 100 mL IVPB  Status:  Discontinued        2 g 200 mL/hr over 30 Minutes Intravenous Every 24 hours 10/20/20 1122 10/25/20 1046   10/20/20 1215  vancomycin (VANCOCIN) 1,750 mg in sodium chloride 0.9 %  500 mL IVPB        1,750 mg 250 mL/hr over 120 Minutes Intravenous  Once 10/20/20 1129 10/21/20 1040   10/20/20 1200  cefTRIAXone (ROCEPHIN) 1 g in sodium chloride 0.9 % 100 mL IVPB  Status:  Discontinued        1 g 200 mL/hr over 30 Minutes Intravenous Every 24 hours 10/20/20 1106 10/20/20 1122   10/16/20 2200  piperacillin-tazobactam (ZOSYN) IVPB 3.375 g  Status:  Discontinued        3.375 g 12.5 mL/hr over 240 Minutes Intravenous Every 8 hours 10/16/20 1134 10/16/20 1537   10/14/20 2300  piperacillin-tazobactam (ZOSYN) IVPB 3.375 g  Status:  Discontinued        3.375 g 12.5 mL/hr over 240 Minutes Intravenous Every 12 hours 10/14/20 1516 10/16/20 1134   10/14/20 1200  vancomycin (VANCOREADY) IVPB 1000 mg/200 mL  Status:  Discontinued        1,000 mg 200 mL/hr over 60 Minutes Intravenous Every 24 hours 10/13/20 1141 10/14/20 0956   10/13/20 1400  piperacillin-tazobactam (ZOSYN) IVPB 3.375 g  Status:  Discontinued        3.375 g 100 mL/hr over 30 Minutes Intravenous Every 6 hours 10/13/20 1141 10/14/20 1516   10/13/20 0600  piperacillin-tazobactam (ZOSYN) IVPB 2.25 g  Status:  Discontinued        2.25 g 100 mL/hr over 30 Minutes Intravenous Every 8 hours 10/12/20 2004 10/13/20 1141   10/12/20 2003  vancomycin variable dose per unstable renal function (pharmacist dosing)  Status:  Discontinued         Does not apply See admin instructions 10/12/20 2004 10/13/20 1141   10/12/20 1930  vancomycin (VANCOREADY) IVPB 1750 mg/350 mL        1,750 mg 175 mL/hr over 120 Minutes Intravenous  Once 10/12/20 1923 10/12/20 2252  10/12/20 1915  piperacillin-tazobactam (ZOSYN) IVPB 3.375 g        3.375 g 100 mL/hr over 30 Minutes Intravenous  Once 10/12/20 1902 10/12/20 2048   10/12/20 1915  vancomycin (VANCOCIN) 1,750 mg in sodium chloride 0.9 % 500 mL IVPB  Status:  Discontinued        1,750 mg 250 mL/hr over 120 Minutes Intravenous  Once 10/12/20 1902 10/12/20 1925       Radiology Reports  No  results found.    DVT prophylaxis: SCDs  Code Status: Full code  Family Communication: No family at bedside   Consultants:  Palliative care      Objective    Physical Examination:    General-appears in no acute distress  Heart-S1-S2, regular, no murmur auscultated  Lungs-clear to auscultation bilaterally, no wheezing or crackles auscultated  Abdomen-soft, nontender, no organomegaly  Extremities-no edema in the lower extremities  Neuro-alert, oriented x3, no focal deficit noted   Status is: Inpatient  Dispo: The patient is from: Home              Anticipated d/c is to: Residential hospice              Anticipated d/c date is: 11/08/2020              Patient currently not stable for discharge  Barrier to discharge-awaiting bed at residential hospice  COVID-19 Labs  No results for input(s): DDIMER, FERRITIN, LDH, CRP in the last 72 hours.  Lab Results  Component Value Date   [place] NEGATIVE 10/13/2020    Microbiology  Recent Results (from the past 240 hour(s))  Culture, blood (routine x 2)     Status: None   Collection Time: 11/01/20  6:10 PM   Specimen: BLOOD  Result Value Ref Range Status   Specimen Description BLOOD LEFT ANTECUBITAL  Final   Special Requests   Final    BOTTLES DRAWN AEROBIC AND ANAEROBIC Blood Culture adequate volume   Culture   Final    NO GROWTH 5 DAYS Performed at [facility], [address], [place], [place] [address]    Report Status 11/06/2020 FINAL  Final  Culture, blood (routine x 2)     Status: None   Collection Time: 11/01/20  6:20 PM   Specimen: BLOOD LEFT HAND  Result Value Ref Range Status   Specimen Description BLOOD LEFT HAND  Final   Special Requests   Final    BOTTLES DRAWN AEROBIC AND ANAEROBIC Blood Culture adequate volume   Culture   Final    NO GROWTH 5 DAYS Performed at [facility], [place] [address] [place], [place] [address]    Report Status 11/06/2020 FINAL  Final  MRSA PCR  Screening     Status: None   Collection Time: 11/01/20  7:00 PM   Specimen: Nasal Mucosa; Nasopharyngeal  Result Value Ref Range Status   MRSA by PCR NEGATIVE NEGATIVE Final    Comment:        The GeneXpert MRSA Assay (FDA approved for NASAL specimens only), is one component of a comprehensive MRSA colonization surveillance program. It is not intended to diagnose MRSA infection nor to guide or monitor treatment for MRSA infections. Performed at [facility], [place] [address], [place], [place] [address]     Pressure Injury 10/13/20 Thigh Anterior;Left;Proximal Unstageable - Full thickness tissue loss in which the base of the injury is covered by slough (yellow, tan, gray, green or brown)  and/or eschar (tan, brown or black) in the wound bed. (Active)  10/13/20 0200  Location: Thigh  Location Orientation: Anterior;Left;Proximal  Staging: Unstageable - Full thickness tissue loss in which the base of the injury is covered by slough (yellow, tan, gray, green or brown) and/or eschar (tan, brown or black) in the wound bed.  Wound Description (Comments):   Present on Admission: Yes     Pressure Injury 10/13/20 Knee Anterior;Left Deep Tissue Pressure Injury - Purple or maroon localized area of discolored intact skin or blood-filled blister due to damage of underlying soft tissue from pressure and/or shear. (Active)  10/13/20 0200  Location: Knee  Location Orientation: Anterior;Left  Staging: Deep Tissue Pressure Injury - Purple or maroon localized area of discolored intact skin or blood-filled blister due to damage of underlying soft tissue from pressure and/or shear.  Wound Description (Comments):   Present on Admission: Yes     Pressure Injury 10/13/20 Flank Left;Upper Unstageable - Full thickness tissue loss in which the base of the injury is covered by slough (yellow, tan, gray, green or brown) and/or eschar (tan, brown or black) in the wound bed. (Active)  10/13/20 0200  Location:  Flank  Location Orientation: Left;Upper  Staging: Unstageable - Full thickness tissue loss in which the base of the injury is covered by slough (yellow, tan, gray, green or brown) and/or eschar (tan, brown or black) in the wound bed.  Wound Description (Comments):   Present on Admission: Yes     Pressure Injury 10/13/20 Toe (Comment  which one) Anterior;Right Deep Tissue Pressure Injury - Purple or maroon localized area of discolored intact skin or blood-filled blister due to damage of underlying soft tissue from pressure and/or shear. (Active)  10/13/20 0200  Location: Toe (Comment  which one)  Location Orientation: Anterior;Right  Staging: Deep Tissue Pressure Injury - Purple or maroon localized area of discolored intact skin or blood-filled blister due to damage of underlying soft tissue from pressure and/or shear.  Wound Description (Comments):   Present on Admission: Yes     Pressure Injury 10/13/20 Knee Anterior;Right Unstageable - Full thickness tissue loss in which the base of the injury is covered by slough (yellow, tan, gray, green or brown) and/or eschar (tan, brown or black) in the wound bed. (Active)  10/13/20 0800  Location: Knee  Location Orientation: Anterior;Right  Staging: Unstageable - Full thickness tissue loss in which the base of the injury is covered by slough (yellow, tan, gray, green or brown) and/or eschar (tan, brown or black) in the wound bed.  Wound Description (Comments):   Present on Admission: Yes     Pressure Injury 10/13/20 Ankle Posterior;Right Deep Tissue Pressure Injury - Purple or maroon localized area of discolored intact skin or blood-filled blister due to damage of underlying soft tissue from pressure and/or shear. (Active)  10/13/20 1900  Location: Ankle  Location Orientation: Posterior;Right  Staging: Deep Tissue Pressure Injury - Purple or maroon localized area of discolored intact skin or blood-filled blister due to damage of underlying soft  tissue from pressure and/or shear.  Wound Description (Comments):   Present on Admission: Yes     Pressure Injury 10/13/20 Knee Left;Lateral Deep Tissue Pressure Injury - Purple or maroon localized area of discolored intact skin or blood-filled blister due to damage of underlying soft tissue from pressure and/or shear. (Active)  10/13/20 1900  Location: Knee  Location Orientation: Left;Lateral  Staging: Deep Tissue Pressure Injury - Purple or maroon localized area of discolored intact skin  or blood-filled blister due to damage of underlying soft tissue from pressure and/or shear.  Wound Description (Comments):   Present on Admission: Yes     Pressure Injury 10/13/20 Flank Left;Mid Unstageable - Full thickness tissue loss in which the base of the injury is covered by slough (yellow, tan, gray, green or brown) and/or eschar (tan, brown or black) in the wound bed. (Active)  10/13/20 1900  Location: Flank  Location Orientation: Left;Mid  Staging: Unstageable - Full thickness tissue loss in which the base of the injury is covered by slough (yellow, tan, gray, green or brown) and/or eschar (tan, brown or black) in the wound bed.  Wound Description (Comments):   Present on Admission: Yes     Pressure Injury 10/13/20 Elbow Left;Posterior Deep Tissue Pressure Injury - Purple or maroon localized area of discolored intact skin or blood-filled blister due to damage of underlying soft tissue from pressure and/or shear. (Active)  10/13/20 1900  Location: Elbow  Location Orientation: Left;Posterior  Staging: Deep Tissue Pressure Injury - Purple or maroon localized area of discolored intact skin or blood-filled blister due to damage of underlying soft tissue from pressure and/or shear.  Wound Description (Comments):   Present on Admission: Yes     Pressure Injury 10/13/20 Elbow Posterior;Right Deep Tissue Pressure Injury - Purple or maroon localized area of discolored intact skin or blood-filled blister  due to damage of underlying soft tissue from pressure and/or shear. (Active)  10/13/20 1900  Location: Elbow  Location Orientation: Posterior;Right  Staging: Deep Tissue Pressure Injury - Purple or maroon localized area of discolored intact skin or blood-filled blister due to damage of underlying soft tissue from pressure and/or shear.  Wound Description (Comments):   Present on Admission: Yes          [place]   [facility] If 7PM-7AM, please contact night-coverage at [email or web address], Office  [phone]   11/06/2020, 4:28 PM  LOS: 25 days

## 2020-11-06 NOTE — Progress Notes (Signed)
Patient transferred to [phone].  Report given to [place], Therapist, sports.  Transported via bed by [facility] nursing staff.     Donah Driver, RN

## 2020-11-06 NOTE — Progress Notes (Signed)
[facility] ([facility]) Hospital Liaison note.    Received request from TOC manager for family interest in [facility]. [facility] is unable to offer a room today. Hospital Liaison will follow up tomorrow or sooner if a room becomes available and eligibility is confirmed.   A Please do not hesitate to call with questions.    Thank you,   Mary Anne Robertson, RN, CCM      [facility] Hospital Liaison (listed on AMION under Hospice /[facility])    [phone] 

## 2020-11-06 NOTE — Progress Notes (Signed)
Daily Progress Note   Patient Name: Raymond Stone       Date: 11/06/2020 DOB: 02-Aug-1950  Age: 70 y.o. MRN#: [id number] Attending Physician: Oswald Hillock, MD Primary Care Physician: [name], NP Admit Date: 10/12/2020  Reason for Consultation/Follow-up: Establishing goals of care  Subjective: Patient having persist Patient resting comfortably in bed this morning.  His sister's note he has not responded to them.  Conference held with patient's sisters Trippi and Lorriane Shire.  His daughter [name] was on the phone.  Dr. Darrick Meigs was also kindly present. His medical course was discussed.  He is having persistent delirium and is not eating or drinking enough to sustain life.  He has a core track tube in, and SLP notes he takes food in his mouth then he spits it back out.  Last night he refused all medications and treatments per nursing. The question was asked if patient would want a permanent feeding tube.  Family quickly all agree that patient would not want feeding tube in fact at this point he would not want continued treatments in general.  He would not want to live a debilitated life or go to a nursing facility. We discussed the option for comfort feeding and drinking and allowing what appears to be the progression of a natural dying process.  Family was all in agreement that this is the path that patient would choose.  Patient's sister works for hospice and is familiar with hospice care.  CODE STATUS was discussed and family is in agreement with a DO NOT RESUSCITATE order. Comfort measures only was discussed in detail with family including stopping medications and interventions that are not primarily for comfort, no further labs or IV fluids or antibiotics.  Family is understanding.  We discussed  hospice based care and how this could happen in the home or possibly in a hospice house.  Family would prefer care at an inpatient hospice facility.  They are aware that if patient begins to eat and drink after NG tube is pulled then he would not be eligible for hospice facility. At the close of our conversation family was all in agreement for comfort measures only, and they would like to proceed with request for hospice facility bed.  Review of Systems  Unable to perform ROS: Mental status change  Length of Stay: 25  Current Medications: Scheduled Meds:  . colchicine  0.6 mg Oral BID  . collagenase   Topical Daily  . feeding supplement  237 mL Oral TID WC & HS  . mouth rinse  15 mL Mouth Rinse BID  . mirtazapine  15 mg Oral QHS    Continuous Infusions: . sodium chloride 1,000 mL (10/21/20 1514)  . feeding supplement (OSMOLITE 1.5 CAL) 65 mL/hr at 11/05/20 0500    PRN Meds: sodium chloride, acetaminophen (TYLENOL) oral liquid 160 mg/5 mL, glycopyrrolate **OR** glycopyrrolate **OR** glycopyrrolate, haloperidol **OR** haloperidol **OR** haloperidol lactate, lip balm, LORazepam **OR** LORazepam **OR** LORazepam, morphine injection, ondansetron **OR** ondansetron (ZOFRAN) IV, oxyCODONE, polyvinyl alcohol, traMADol  Physical Exam Vitals and nursing note reviewed.  Constitutional:      Comments: Frail, cachetic  Neurological:     Comments: lethargic             Vital Signs: BP 105/72 (BP Location: Right Arm)   Pulse (!) 102   Temp 98.7 F (37.1 C) (Oral)   Resp 18   Ht 5\' 9"  (1.753 m)   Wt 68 kg   SpO2 100%   BMI 22.15 kg/m  SpO2: SpO2: 100 % O2 Device: O2 Device: Room Air O2 Flow Rate: O2 Flow Rate (L/min): 1 L/min  Intake/output summary:   Intake/Output Summary (Last 24 hours) at 11/06/2020 1040 Last data filed at 11/06/2020 0900 Gross per 24 hour  Intake 551.42 ml  Output 1075 ml  Net -523.58 ml   LBM: Last BM Date: 11/06/20 Baseline Weight: Weight: 87.9 kg  (from 2021 records) Most recent weight: Weight: 68 kg       Palliative Assessment/Data: PPS: 10%    Flowsheet Rows   Flowsheet Row Most Recent Value  Intake Tab   Referral Department Hospitalist  Unit at Time of Referral Intermediate Care Unit  Palliative Care Primary Diagnosis Trauma  Date Notified 10/21/20  Palliative Care Type New Palliative care  Reason for referral Clarify Goals of Care  Date of Admission 10/12/20  Date first seen by Palliative Care 10/23/20  # of days Palliative referral response time 2 Day(s)  # of days IP prior to Palliative referral 9  Clinical Assessment   Palliative Performance Scale Score 20%  Psychosocial & Spiritual Assessment   Palliative Care Outcomes   Patient/Family meeting held? Yes  Who was at the meeting? dtr, sister, patient  Palliative Care Outcomes Clarified goals of care, Provided psychosocial or spiritual support      Patient Active Problem List   Diagnosis Date Noted  . Polyarticular gout   . SIRS (systemic inflammatory response syndrome) (HCC)   . Polyarthralgia   . History of gout   . Fever, unknown origin 10/27/2020  . Cerebral atrophy ([place]) 10/26/2020  . Failure to thrive in adult 10/26/2020  . Hypernatremia 10/18/2020  . Hyperkalemia 10/18/2020  . Acute metabolic encephalopathy 29/51/8841  . ATN (acute tubular necrosis) ([place]) 10/18/2020  . Malnutrition of moderate degree 10/17/2020  . Pressure injury of skin 10/13/2020  . Angioedema 05/22/2017  . 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 Endotracheally intubated   . History of alcohol dependence ([place]) 05/17/2017  . Leukocytosis 01/27/2017  . AKI (acute kidney injury) ([place]) 01/24/2017  . 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 Alcohol dependence ([place]) 01/24/2017  . Normocytic anemia 01/24/2017  . Thrombocytosis 01/24/2017  . Chronic diastolic CHF (congestive heart failure) ([place]) 01/24/2017  . Scrotal edema 10/20/2016  . Nephrotic  syndrome 10/20/2016  . Essential hypertension 01/07/2016  . Severe eczema 04/16/2014    Palliative Care Assessment & Plan   Patient Profile: 70 y.o.malewith past medical history of HTN and ETOH abuseadmitted on 4/2/2022after being found down at home - had not been seen for 5 days.He was diagnosed with hypovolemic shock complicated by AKI and received IV fluids and antibiotics. Did require CRRT d/t AKI. Renal function now improved but patient remains confused, refusing meds and PO intake. New fevers 4/10 and antibiotics restarted - concern for wound infection. PMT initially consulted 4/13 to discuss [place].Had a coretrack placed on 4/15, clamped 4/22 for calorie count- inadequate so tube feeding restarted. He has wounds on his left thigh and left hemithorax. Noted to have aspiration pneumonitis on admission. He has not shown much improvement over his 23 days hospitalized.   Assessment/Recommendations/Plan  Patient appears to be at end of life- not eating or drinking, persistent delirium Transition to full comfort measures only- d/c NG tube TOC referral for Hospice House request Triamcinolone for severe eczema   Goals of Care and Additional Recommendations: Limitations on Scope of Treatment: Full Comfort Care  Code Status: DNR  Prognosis:  < 2 weeks due to sepsis of unknown origin, no recovering, not eating or drinking, persistent delirium  Discharge Planning: Hospice facility pending evaluation, approval, and availability  Care plan was discussed with family.   Thank you for allowing the Palliative Medicine Team to assist in the care of this patient.   Total time: 61 mins Greater than 50%  of this time was spent counseling and coordinating care related to the above assessment and plan.  Mariana Kaufman, AGNP-C Palliative Medicine   Please contact Palliative Medicine Team phone at [phone] for questions and concerns.

## 2020-11-06 NOTE — Progress Notes (Signed)
Patient continues to refuse PO meds and treatment overnight. Patient did agree to L lateral knee dressing change.

## 2020-11-07 ENCOUNTER — Inpatient Hospital Stay (HOSPITAL_COMMUNITY): Payer: Medicare Other

## 2020-11-07 MED ORDER — MIRTAZAPINE 15 MG PO TBDP: 15.0000 mg | ORAL_TABLET | Freq: Every day | ORAL | Status: AC

## 2020-11-07 MED ORDER — COLCHICINE 0.6 MG PO TABS: 0.6000 mg | ORAL_TABLET | Freq: Two times a day (BID) | ORAL | Status: AC

## 2020-11-07 NOTE — Discharge Summary (Signed)
Physician Discharge Summary  Raymond Stone [id number] DOB: 20-Oct-1950 DOA: 10/12/2020  PCP: [name], NP  Admit date: 10/12/2020 Discharge date: 11/07/2020  Time spent:  minutes  Recommendations for Outpatient Follow-up:  1. Patient to be discharged to residential hospice   Discharge Diagnoses:  Active Problems:   Essential hypertension   AKI (acute kidney injury) ([place])   Alcohol dependence (HCC)   Pressure injury of skin   Malnutrition of moderate degree   Hypernatremia   Hyperkalemia   Acute metabolic encephalopathy   ATN (acute tubular necrosis) (HCC)   Cerebral atrophy (HCC)   Failure to thrive in adult   Fever, unknown origin   Polyarthralgia   History of gout   Polyarticular gout   SIRS (systemic inflammatory response syndrome) ([place])   Discharge Condition: Stable  Diet recommendation: Comfort diet  Filed Weights   11/04/20 0435 11/05/20 0407 11/06/20 0554  Weight: 68 kg 68 kg 68 kg    History of present illness:  70 year old male with medical history of hypertension who was found down at home and was not seen for 5 days.  When EMS arrived patient was severely hypertensive with temperature of 100 F.  Per patient's daughter and sister patient has been fully functional prior to this, was ambulating and was independent with all ADLs.  Admission CT head was nonacute, repeat CT head was also unremarkable.  Patient was found to have severe hypernatremia with sodium 172, severe hyperkalemia with potassium 7.3, BUN 211, creatinine 11.2, elevated troponin 489, CPK greater than 2400 on admission.  He was admitted to ICU and started on CRRT on 10/13/2020 with improvement in kidney function.  CPK peaked at 2400. CT abdomen/pelvis on 10/23/2020 showed no acute intra-abdominal pathology. Patient continued to have poor p.o. intake so core track feeding tube was placed on 10/25/2020 and tube feed was initiated.  Feeding tube was clamped on 11/01/2020 for calorie count, patient  was not able to take adequate amount of oral nutrition so feeding tube was restarted on 11/02/2020. CT cervical spine on 11/01/2020 showed erosive changes of the dens/severe right C2-3 facet arthrosis.  Neurosurgery recommended outpatient follow-up.   Hospital Course:  1. Metabolic encephalopathy-patient presented with metabolic encephalopathy likely from uremia, rhabdomyolysis, dehydration, acute kidney injury.  Patient was started on hemodialysis, currently metabolic encephalopathy has significantly improved.  Still not back to baseline.  Patient also has significant history of alcohol abuse.  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  Patient has been started on colchicine.   3. Generalized weakness affecting upper extremities-CT head on 11/01/20 was unremarkable.  CT cervical spine showed erosive changes of dens and severe right C2-C3 facet arthrosis.  [name] discussed with neurosurgeon [name] on 11/02/2020, he recommended pain control and follow-up as outpatient.  Patient to be discharged to residential hospice.  4. Persistent recurrent fever and tachycardia-ID recommended to hold off antibiotics and observe.  Patient to be discharged to residential hospice.  5. Moderate protein calorie malnutrition-core track feeding tube was placed on 4/15 and tube feeding was started.  Tube feeds was clamped on 11/01/2020 for calorie count.  Restarted core track tube feeding on 11/02/2020 as patient had minimal oral intake.  6. Complex wounds-patient has complex wounds on left hemithorax and left thigh.  Wound care was consulted.  He received antibiotics for 5 days.  7. Goals of care-goals of care meeting was done with palliative care provider  as well as patient's sisters and daughter on phone.  At this time whole family is in agreement of not inserting artificial tube feeding as this was patient's  wish.  Tube feed will be stopped, discontinue NG tube.  Comfort feeds will be started.  Family is agreeable to explore option of residential hospice.   Procedures:    Consultations:    Discharge Exam: Vitals:   11/06/20 2013 11/07/20 0541  BP: 102/61 110/60  Pulse: (!) 101 99  Resp: 20 18  Temp: 98.5 F (36.9 C) 98.3 F (36.8 C)  SpO2: 98% 98%    General: Appears in no acute distress Cardiovascular: S1-S2, regular Respiratory: Clear to auscultation bilaterally  Discharge Instructions   Discharge Instructions    Diet - low sodium heart healthy   Complete by: As directed    Discharge wound care:   Complete by: As directed    Nurse to place saline moist gauze to left lower flank wound, covered with ABD pads.  Tape in place.  Perform twice daily. Clean wounds to left lateral thigh and chest with normal saline and pat dry.  Apply Santyl to open wounds.  Cover with NS moist gauze and top with dry dressing and foam.  Change daily   Increase activity slowly   Complete by: As directed      Allergies as of 11/07/2020      Reactions   Lisinopril Swelling   ANGIOEDEMA      Medication List    STOP taking these medications   aspirin EC 81 MG tablet     TAKE these medications   amLODipine 10 MG tablet Commonly known as: NORVASC Take 1 tablet (10 mg total) by mouth daily.   colchicine 0.6 MG tablet Take 1 tablet (0.6 mg total) by mouth 2 (two) times daily.   metoprolol tartrate 25 MG tablet Commonly known as: LOPRESSOR TAKE 1 TABLET(25 MG) BY MOUTH THREE TIMES DAILY What changed:   how much to take  how to take this  when to take this  additional instructions   mirtazapine 15 MG disintegrating tablet Commonly known as: REMERON SOL-TAB Take 1 tablet (15 mg total) by mouth at bedtime.            Discharge Care Instructions  (From admission, onward)         Start     Ordered   11/07/20 0000  Discharge wound care:       Comments: Nurse to place  saline moist gauze to left lower flank wound, covered with ABD pads.  Tape in place.  Perform twice daily. Clean wounds to left lateral thigh and chest with normal saline and pat dry.  Apply Santyl to open wounds.  Cover with NS moist gauze and top with dry dressing and foam.  Change daily   11/07/20 1409         Allergies  Allergen Reactions  . Lisinopril Swelling    ANGIOEDEMA    Contact information for follow-up providers    Dawley, Troy C, DO. Call in 1 day(s).   Why: Please call for a post hospital follow up appointment for your neck pain and erosive change of the dens. Contact information: [address] [place] [address] [place] [address] [phone]            Contact information for after-discharge care    Destination    [facility] Preferred SNF .   Service: Skilled Nursing Contact information: [address]  [place] [id number] [phone]                   The results of significant diagnostics from this hospitalization (including imaging, microbiology, ancillary and laboratory) are listed below for reference.    Significant Diagnostic Studies: CT HEAD WO CONTRAST  Result Date: 11/01/2020 CLINICAL DATA:  Generalized weakness EXAM: CT HEAD WITHOUT CONTRAST CT CERVICAL SPINE WITHOUT CONTRAST TECHNIQUE: Multidetector CT imaging of the head and cervical spine was performed following the standard protocol without intravenous contrast. Multiplanar CT image reconstructions of the cervical spine were also generated. COMPARISON:  None. FINDINGS: CT HEAD FINDINGS Brain: There is no mass, hemorrhage or extra-axial collection. The size and configuration of the ventricles and extra-axial CSF spaces are normal. The brain parenchyma is normal, without evidence of acute or chronic infarction. Vascular: No abnormal hyperdensity of the major intracranial arteries or dural venous sinuses. No intracranial atherosclerosis. Skull: The  visualized skull base, calvarium and extracranial soft tissues are normal. Sinuses/Orbits: No fluid levels or advanced mucosal thickening of the visualized paranasal sinuses. No mastoid or middle ear effusion. The orbits are normal. CT CERVICAL SPINE FINDINGS Alignment: No static subluxation. Facets are aligned. Occipital condyles are normally positioned. Skull base and vertebrae: No acute fracture. Erosive change of the dens. Soft tissues and spinal canal: No prevertebral fluid or swelling. No visible canal hematoma. Disc levels: Multilevel anterior osteophytosis. There is fusion of the left facets at C4-5 and fusion of the right facets at C3-4. There is erosive change of the dens. There is severe right C2-3 facet arthrosis. Upper chest: No pneumothorax, pulmonary nodule or pleural effusion. Other: Normal visualized paraspinal cervical soft tissues. IMPRESSION: 1. No acute intracranial abnormality. 2. No acute fracture or static subluxation of the cervical spine. 3. Erosive change of the dens, which may be a sequela of CPPD or rheumatoid arthritis. Electronically Signed   By: Deatra RobinsonKevin  Herman M.D.   On: 11/01/2020 21:33   CT Head Wo Contrast  Result Date: 10/12/2020 CLINICAL DATA:  Mental status change EXAM: CT HEAD WITHOUT CONTRAST TECHNIQUE: Contiguous axial images were obtained from the base of the skull through the vertex without intravenous contrast. COMPARISON:  04/20/2013 FINDINGS: Brain: No acute intracranial hemorrhage. No focal mass lesion. No CT evidence of acute infarction. No midline shift or mass effect. No hydrocephalus. Basilar cisterns are patent. There are periventricular and subcortical white matter hypodensities. Generalized cortical atrophy. Vascular: No hyperdense vessel or unexpected calcification. Skull: Normal. Negative for fracture or focal lesion. Sinuses/Orbits: Paranasal sinuses and mastoid air cells are clear. Orbits are clear. Other: None. IMPRESSION: 1. No acute intracranial  findings. 2. Progressive atrophy and white matter microvascular disease compared to CT exam 2014. Electronically Signed   By: Genevive BiStewart  Edmunds M.D.   On: 10/12/2020 18:55   CT CERVICAL SPINE WO CONTRAST  Result Date: 11/01/2020 CLINICAL DATA:  Generalized weakness EXAM: CT HEAD WITHOUT CONTRAST CT CERVICAL SPINE WITHOUT CONTRAST TECHNIQUE: Multidetector CT imaging of the head and cervical spine was performed following the standard protocol without intravenous contrast. Multiplanar CT image reconstructions of the cervical spine were also generated. COMPARISON:  None. FINDINGS: CT HEAD FINDINGS Brain: There is no mass, hemorrhage or extra-axial collection. The size and configuration of the ventricles and extra-axial CSF spaces are normal. The brain parenchyma is normal, without evidence of acute or chronic infarction. Vascular: No abnormal hyperdensity of the major intracranial arteries or dural venous sinuses. No intracranial atherosclerosis. Skull: The visualized skull base, calvarium and extracranial  soft tissues are normal. Sinuses/Orbits: No fluid levels or advanced mucosal thickening of the visualized paranasal sinuses. No mastoid or middle ear effusion. The orbits are normal. CT CERVICAL SPINE FINDINGS Alignment: No static subluxation. Facets are aligned. Occipital condyles are normally positioned. Skull base and vertebrae: No acute fracture. Erosive change of the dens. Soft tissues and spinal canal: No prevertebral fluid or swelling. No visible canal hematoma. Disc levels: Multilevel anterior osteophytosis. There is fusion of the left facets at C4-5 and fusion of the right facets at C3-4. There is erosive change of the dens. There is severe right C2-3 facet arthrosis. Upper chest: No pneumothorax, pulmonary nodule or pleural effusion. Other: Normal visualized paraspinal cervical soft tissues. IMPRESSION: 1. No acute intracranial abnormality. 2. No acute fracture or static subluxation of the cervical spine.  3. Erosive change of the dens, which may be a sequela of CPPD or rheumatoid arthritis. Electronically Signed   By: Ulyses Jarred M.D.   On: 11/01/2020 21:33   CT ABDOMEN PELVIS W CONTRAST  Result Date: 10/23/2020 CLINICAL DATA:  Abdominal abscess EXAM: CT ABDOMEN AND PELVIS WITH CONTRAST TECHNIQUE: Multidetector CT imaging of the abdomen and pelvis was performed using the standard protocol following bolus administration of intravenous contrast. CONTRAST:  164mL OMNIPAQUE IOHEXOL 300 MG/ML  SOLN COMPARISON:  None. FINDINGS: Lower chest: Mild bibasilar atelectasis. The visualized heart and pericardium are unremarkable. Hepatobiliary: No focal liver abnormality is seen. No gallstones, gallbladder wall thickening, or biliary dilatation. Pancreas: Unremarkable Spleen: Unremarkable Adrenals/Urinary Tract: The adrenal glands are unremarkable. The kidneys are normal in size and position. Simple cortical cyst noted within the lower pole the right kidney. The kidneys are otherwise unremarkable. Gas is seen non dependently within the bladder lumen, nonspecific but possibly related to recent catheterization. The bladder is mildly distended, but is otherwise unremarkable. Stomach/Bowel: Severe descending and sigmoid colonic diverticulosis. Scattered diverticular seen throughout the remainder of the colon. The stomach, small bowel, and large bowel are otherwise unremarkable. Appendix normal. No evidence of obstruction or focal inflammation. No free intraperitoneal gas or fluid. Vascular/Lymphatic: Minimal aortoiliac atherosclerotic calcification is present. No aortic aneurysm. No pathologic adenopathy identified within the abdomen and pelvis. Reproductive: Prostate is unremarkable. Other: Tiny fat containing umbilical hernia. Rectum unremarkable. Focal soft tissue infiltration within the subcutaneous fat of the right lower quadrant abdominal wall and associated punctate foci of gas likely related to subcutaneous injection.  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 No acute bone abnormality. Degenerative changes are seen within the lumbar spine and hips bilaterally. No lytic or blastic bone lesions are identified. IMPRESSION: No acute intra-abdominal pathology. No intra-abdominal abscess identified. Distal colonic diverticulosis. No superimposed inflammatory change identified. Small gas within the bladder lumen non dependently. Correlation for recent bladder catheterization may be helpful. Aortic Atherosclerosis (ICD10-I70.0).  In Electronically Signed   By: Fidela Salisbury MD   On: 10/23/2020 04:14   US RENAL  Result Date: 10/12/2020 CLINICAL DATA:  Acute kidney injury EXAM: RENAL / URINARY TRACT ULTRASOUND COMPLETE COMPARISON:  None. FINDINGS: Right Kidney: Renal measurements: 10.1 x 6.2 x 5.5 cm = volume: 182 mL. 3 cm cyst in the lower pole. Mildly increased echotexture. No hydronephrosis. Left Kidney: Renal measurements: 9.9 x 5.8 x 4.5 cm. = volume: 134 mL. Increased echotexture. No mass or hydronephrosis. Bladder: Decompressed, Foley catheter in place. Other: None. IMPRESSION: Increased echotexture compatible with chronic medical renal disease. No hydronephrosis. Electronically Signed   By: Rolm Baptise M.D.  On: 10/12/2020 23:36   DG CHEST PORT 1 VIEW  Result Date: 11/02/2020 CLINICAL DATA:  Feeding tube placement EXAM: PORTABLE CHEST 1 VIEW COMPARISON:  11/02/2020 FINDINGS: Feeding tube tip is well below the level of the GE junction. Heart size normal. No pleural effusion or edema. No airspace densities. The heart size and mediastinal contours are within normal limits. Both lungs are clear. The visualized skeletal structures are unremarkable. IMPRESSION: No active cardiopulmonary abnormalities. Electronically Signed   By: Kerby Moors M.D.   On: 11/02/2020 09:41   DG CHEST PORT 1 VIEW  Result Date: 10/27/2020 CLINICAL DATA:  Shortness of breath, fever, abdominal  discomfort. EXAM: PORTABLE CHEST 1 VIEW COMPARISON:  Chest x-rays dated 10/20/2020 and 10/13/2020. FINDINGS: Enteric tube passes below the diaphragm. Heart size and mediastinal contours are stable. Lungs are clear. No pleural effusion or pneumothorax is seen. Osseous structures about the chest are unremarkable. IMPRESSION: No active disease. No evidence of pneumonia, atelectasis or pulmonary edema. Electronically Signed   By: Franki Cabot M.D.   On: 10/27/2020 09:24   DG CHEST PORT 1 VIEW  Result Date: 10/20/2020 CLINICAL DATA:  Fever EXAM: PORTABLE CHEST 1 VIEW COMPARISON:  October 13, 2020 FINDINGS: Central catheter has been removed without pneumothorax. There is slight left base atelectasis. Lungs elsewhere clear. Heart is upper normal in size with pulmonary vascularity normal. No adenopathy. Metallic foreign body in the periphery of the right lower hemithorax is stable. No bone lesions. IMPRESSION: Mild left base atelectasis. No edema or airspace opacity. Stable cardiac silhouette. No pneumothorax. Electronically Signed   By: Lowella Grip III M.D.   On: 10/20/2020 08:10   DG CHEST PORT 1 VIEW  Result Date: 10/13/2020 CLINICAL DATA:  Central line placement. EXAM: PORTABLE CHEST 1 VIEW COMPARISON:  Radiographs 10/12/2020 and 05/25/2017. FINDINGS: 1207 hours. Right IJ central venous catheter projects to the level of the upper SVC. The heart size and mediastinal contours are stable. Probable mild atelectasis at the right lung base, similar to prior studies. The lungs are otherwise clear. There is no pleural effusion or pneumothorax. Stable chronic metallic foreign body in the right lateral chest wall. No acute osseous findings. IMPRESSION: Central venous catheter placement as described. No evidence of pneumothorax. Electronically Signed   By: Richardean Sale M.D.   On: 10/13/2020 12:38   DG Chest Port 1 View  Result Date: 10/12/2020 CLINICAL DATA:  Fever, altered mental status EXAM: PORTABLE CHEST 1  VIEW COMPARISON:  None. FINDINGS: Normal mediastinum and cardiac silhouette. Normal pulmonary vasculature. No evidence of effusion, infiltrate, or pneumothorax. No acute bony abnormality. IMPRESSION: No acute cardiopulmonary process. Electronically Signed   By: Suzy Bouchard M.D.   On: 10/12/2020 19:00   DG Abd Portable 1V  Result Date: 11/02/2020 CLINICAL DATA:  Evaluate feeding tube. EXAM: PORTABLE ABDOMEN - 1 VIEW COMPARISON:  10/27/2020 FINDINGS: There is a feeding tube with tip projecting over the expected location of the duodenal bulb/pylorus. This is unchanged from previous exam. No dilated loops of large or small bowel. Gas is seen within the colon up to the rectum. IMPRESSION: Stable position of feeding tube with tip in the duodenal bulb/pylorus. Electronically Signed   By: Kerby Moors M.D.   On: 11/02/2020 09:44   DG Abd Portable 1V  Result Date: 10/27/2020 CLINICAL DATA:  Abdominal discomfort, fever, shortness of breath. EXAM: PORTABLE ABDOMEN - 1 VIEW COMPARISON:  CT abdomen dated 10/23/2020. FINDINGS: Enteric tube appears adequately positioned in the stomach with tip directed towards  the pylorus/duodenal bulb. Bowel gas pattern is nonobstructive. No dilated large or small bowel loops are seen. No evidence of free intraperitoneal air. No evidence of renal or ureteral calculi. IMPRESSION: 1. Enteric tube appears adequately positioned in the stomach with tip directed towards the pylorus/duodenal bulb. 2. Nonobstructive bowel gas pattern. Electronically Signed   By: Franki Cabot M.D.   On: 10/27/2020 09:22   ECHOCARDIOGRAM LIMITED  Result Date: 11/03/2020    ECHOCARDIOGRAM LIMITED REPORT   Patient Name:   Raymond Stone Date of Exam: 11/03/2020 Medical Rec #:  [id number]      Height:       69.0 in Accession #:    [id number]     Weight:       161.0 lb Date of Birth:  September 01, 1950       BSA:          1.884 m Patient Age:    28 years       BP:           97/58 mmHg Patient Gender: M               HR:           122 bpm. Exam Location:  Inpatient Procedure: 2D Echo, Cardiac Doppler and Color Doppler Indications:    Elevated Troponin  History:        Patient has prior history of Echocardiogram examinations, most                 recent 01/28/2016. CHF; Risk Factors:Hypertension.  Sonographer:    Cammy Brochure Referring Phys: [id number] Kayleen Memos  Sonographer Comments: Patient and wound dressing covering his apical window and did not have IV access for definity IMPRESSIONS  1. Left ventricular ejection fraction, by estimation, is 60 to 65%. The left ventricle has normal function. The left ventricle has no regional wall motion abnormalities. There is mild left ventricular hypertrophy.  2. Right ventricular systolic function was not well visualized. The right ventricular size is not well visualized.  3. The mitral valve is normal in structure. No evidence of mitral valve regurgitation. No evidence of mitral stenosis.  4. The aortic valve is tricuspid. Aortic valve regurgitation is not visualized. No aortic stenosis is present. FINDINGS  Left Ventricle: Left ventricular ejection fraction, by estimation, is 60 to 65%. The left ventricle has normal function. The left ventricle has no regional wall motion abnormalities. The left ventricular internal cavity size was normal in size. There is  mild left ventricular hypertrophy. Right Ventricle: The right ventricular size is not well visualized. Right vetricular wall thickness was not assessed. Right ventricular systolic function was not well visualized. Left Atrium: Left atrial size was normal in size. Right Atrium: Right atrial size was normal in size. Pericardium: Trivial pericardial effusion is present. Mitral Valve: The mitral valve is normal in structure. No evidence of mitral valve stenosis. Tricuspid Valve: The tricuspid valve is normal in structure. Tricuspid valve regurgitation is trivial. No evidence of tricuspid stenosis. Aortic Valve: The aortic valve is  tricuspid. Aortic valve regurgitation is not visualized. No aortic stenosis is present. Pulmonic Valve: The pulmonic valve was normal in structure. Pulmonic valve regurgitation is not visualized. No evidence of pulmonic stenosis. Aorta: The aortic root is normal in size and structure. Venous: The inferior vena cava was not well visualized.  Additional Comments: Technically difficult; limited images obtained (no apical views). LEFT VENTRICLE PLAX 2D LVIDd:         3.80  cm LVIDs:         2.20 cm LV PW:         1.30 cm LV IVS:        1.30 cm LVOT diam:     2.10 cm LVOT Area:     3.46 cm  IVC IVC diam: 0.70 cm  AORTA Ao Root diam: 2.90 cm  SHUNTS Systemic Diam: 2.10 cm Kirk Ruths MD Electronically signed by Kirk Ruths MD Signature Date/Time: 11/03/2020/11:13:11 AM    Final     Microbiology: Recent Results (from the past 240 hour(s))  Culture, blood (routine x 2)     Status: None   Collection Time: 11/01/20  6:10 PM   Specimen: BLOOD  Result Value Ref Range Status   Specimen Description BLOOD LEFT ANTECUBITAL  Final   Special Requests   Final    BOTTLES DRAWN AEROBIC AND ANAEROBIC Blood Culture adequate volume   Culture   Final    NO GROWTH 5 DAYS Performed at [facility], [address] [place], [place] [address]    Report Status 11/06/2020 FINAL  Final  Culture, blood (routine x 2)     Status: None   Collection Time: 11/01/20  6:20 PM   Specimen: BLOOD LEFT HAND  Result Value Ref Range Status   Specimen Description BLOOD LEFT HAND  Final   Special Requests   Final    BOTTLES DRAWN AEROBIC AND ANAEROBIC Blood Culture adequate volume   Culture   Final    NO GROWTH 5 DAYS Performed at [facility], [place] [address], [place], [place] [address]    Report Status 11/06/2020 FINAL  Final  MRSA PCR Screening     Status: None   Collection Time: 11/01/20  7:00 PM   Specimen: Nasal Mucosa; Nasopharyngeal  Result Value Ref Range Status   MRSA by PCR NEGATIVE NEGATIVE Final     Comment:        The GeneXpert MRSA Assay (FDA approved for NASAL specimens only), is one component of a comprehensive MRSA colonization surveillance program. It is not intended to diagnose MRSA infection nor to guide or monitor treatment for MRSA infections. Performed at [facility], [place] [address], [place],  [address]      Labs: Basic Metabolic Panel: Recent Labs  Lab 11/01/20 0219 11/02/20 0300 11/03/20 0659 11/04/20 0809 11/06/20 0430  NA 130* 132* 132* 131* 132*  K 4.4 4.4 5.1 4.5 4.6  CL 96* 97* 101 97* 96*  CO2 24 26 21* 26 26  GLUCOSE 132* 96 138* 130* 138*  BUN 29* 36* 35* 28* 34*  CREATININE 0.82 0.92 0.89 0.70 0.75  CALCIUM 9.5 10.1 9.9 10.0 9.5  MG 1.7 1.9 1.8  --   --   PHOS 4.7* 6.4* 4.0  --   --    Liver Function Tests: Recent Labs  Lab 11/03/20 0659  AST 35  ALT 31  ALKPHOS 126  BILITOT 0.4  PROT 7.1  ALBUMIN 1.6*   No results for input(s): LIPASE, AMYLASE in the last 168 hours. No results for input(s): AMMONIA in the last 168 hours. CBC: Recent Labs  Lab 11/01/20 0219 11/02/20 0300 11/03/20 0659 11/04/20 0809 11/06/20 0430  WBC 7.7 8.0 9.1 8.9 14.4*  NEUTROABS 5.1 4.9 6.1  --   --   HGB 7.4* 8.5* 7.0* 7.5* 6.9*  HCT 22.9* 26.2* 21.9* 23.1* 21.2*  MCV 95.0 94.6 96.1 93.9 93.8  PLT 342 393  --  531* 663*   Cardiac Enzymes: No results for input(s): CKTOTAL, CKMB, CKMBINDEX, TROPONINI in the last 168 hours. BNP: BNP (last 3 results) Recent Labs    11/03/20 0659  BNP 33.2    ProBNP (last 3 results) No results for input(s): PROBNP in the last 8760 hours.  CBG: Recent Labs  Lab 11/05/20 1657 11/05/20 1950 11/05/20 2345 11/06/20 0358 11/06/20 0813  GLUCAP 147* 139* 154* 136* 129*       Signed:  Oswald Hillock MD.  [facility] 11/07/2020, 2:11 PM

## 2020-11-07 NOTE — TOC Progression Note (Addendum)
Transition of Care [facility]) - Progression Note    Patient Details  Name: Raymond Stone MRN: [id number] Date of Birth: 1950/12/25  Transition of Care [facility]) CM/SW Contact  Jacalyn Lefevre Edson Snowball, RN Phone Number: 11/07/2020, 1:34 PM  Clinical Narrative:     See note from [facility]. PTAR paperwork and DNR form on patient chart. Once [employer] ready. NCM or nurse will call [place] called requested 4 pm, there are 4 transports ahead of him. Nurse has number to call report. Governor Specking aware   Expected Discharge Plan: [place] Barriers to Discharge: Continued Medical Work up  Expected Discharge Plan and Services Expected Discharge Plan: [place] In-house Referral: Clinical Social Work     Living arrangements for the past 2 months: Single Family Home                                       Social Determinants of Health (SDOH) Interventions    Readmission Risk Interventions No flowsheet data found.

## 2020-11-07 NOTE — Progress Notes (Signed)
Daily Progress Note   Patient Name: Raymond Stone       Date: 11/07/2020 DOB: [birth year]  Age: 70 y.o. MRN#: [id number] Attending Physician: Oswald Hillock, MD Primary Care Physician: [name], NP Admit Date: 10/12/2020  Reason for Consultation/Follow-up: Establishing goals of care and Terminal Care  Subjective: Raymond Stone does not arouse to my voice or light touch. He has a breakfast and lunch tray at bedside that are untouched.  Noted he has been offered a bed at [facility] today.   Review of Systems  Unable to perform ROS: Mental status change    Length of Stay: 26  Current Medications: Scheduled Meds:  . colchicine  0.6 mg Oral BID  . collagenase   Topical Daily  . mouth rinse  15 mL Mouth Rinse BID  . mirtazapine  15 mg Oral QHS  . triamcinolone 0.1 % cream : eucerin   Topical TID    Continuous Infusions: . sodium chloride 1,000 mL (10/21/20 1514)    PRN Meds: sodium chloride, acetaminophen (TYLENOL) oral liquid 160 mg/5 mL, glycopyrrolate **OR** glycopyrrolate **OR** glycopyrrolate, haloperidol **OR** haloperidol **OR** haloperidol lactate, lip balm, LORazepam **OR** LORazepam **OR** LORazepam, morphine injection, ondansetron **OR** ondansetron (ZOFRAN) IV, polyvinyl alcohol, traMADol  Physical Exam Vitals and nursing note reviewed.  Constitutional:      Appearance: He is ill-appearing.  Skin:    General: Skin is warm and dry.  Neurological:     Comments: Does not arouse             Vital Signs: BP 110/60 (BP Location: Left Arm)   Pulse 99   Temp 98.3 F (36.8 C) (Oral)   Resp 18   Ht 5\' 9"  (1.753 m)   Wt 68 kg   SpO2 98%   BMI 22.15 kg/m  SpO2: SpO2: 98 % O2 Device: O2 Device: Room Air O2 Flow Rate: O2 Flow Rate (L/min): 1  L/min  Intake/output summary:   Intake/Output Summary (Last 24 hours) at 11/07/2020 1332 Last data filed at 11/07/2020 0200 Gross per 24 hour  Intake 0 ml  Output 850 ml  Net -850 ml   LBM: Last BM Date: 11/06/20 Baseline Weight: Weight: 87.9 kg (from 2021 records) Most recent weight: Weight: 68 kg       Palliative Assessment/Data: PPS: 10%  Flowsheet Rows   Flowsheet Row Most Recent Value  Intake Tab   Referral Department Hospitalist  Unit at Time of Referral Intermediate Care Unit  Palliative Care Primary Diagnosis Trauma  Date Notified 10/21/20  Palliative Care Type New Palliative care  Reason for referral Clarify Goals of Care  Date of Admission 10/12/20  Date first seen by Palliative Care 10/23/20  # of days Palliative referral response time 2 Day(s)  # of days IP prior to Palliative referral 9  Clinical Assessment   Palliative Performance Scale Score 20%  Psychosocial & Spiritual Assessment   Palliative Care Outcomes   Patient/Family meeting held? Yes  Who was at the meeting? dtr, sister, patient  Palliative Care Outcomes Clarified goals of care, Provided psychosocial or spiritual support      Patient Active Problem List   Diagnosis Date Noted  . Polyarticular gout   . SIRS (systemic inflammatory response syndrome) (HCC)   . Polyarthralgia   . History of gout   . Fever, unknown origin 10/27/2020  . Cerebral atrophy ([place]) 10/26/2020  . Failure to thrive in adult 10/26/2020  . Hypernatremia 10/18/2020  . Hyperkalemia 10/18/2020  . Acute metabolic encephalopathy 46/50/3546  . ATN (acute tubular necrosis) ([place]) 10/18/2020  . Malnutrition of moderate degree 10/17/2020  . Pressure injury of skin 10/13/2020  . Angioedema 05/22/2017  . 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 Endotracheally intubated   . History of alcohol dependence ([place]) 05/17/2017  . Leukocytosis 01/27/2017  . AKI (acute kidney injury) ([place])  01/24/2017  . 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 Alcohol dependence ([place]) 01/24/2017  . Normocytic anemia 01/24/2017  . Thrombocytosis 01/24/2017  . Chronic diastolic CHF (congestive heart failure) ([place]) 01/24/2017  . Scrotal edema 10/20/2016  . Nephrotic syndrome 10/20/2016  . Essential hypertension 01/07/2016  . Severe eczema 04/16/2014    Palliative Care Assessment & Plan   Patient Profile: 70 y.o.malewith past medical history of HTN and ETOH abuseadmitted on 4/2/2022after being found down at home - had not been seen for 5 days.He was diagnosed withhypovolemicshockcomplicated by Alda Berthold received IV fluids and antibiotics. Did require CRRT d/t AKI. Renal function now improved but patient remains confused, refusing meds and PO intake. New fevers 4/10 and antibiotics restarted - concern for wound infection. PMTinitiallyconsulted4/13to discuss GOC.Had a coretrack placed on 4/15, clamped 4/22 for calorie count- inadequate so tube feeding restarted. He has wounds on his left thigh and left hemithorax. Noted to have aspiration pneumonitis on admission. He has not shown much improvement over his 23 days hospitalized.  Assessment/Recommendations/Plan  Continue current comfort measures as ordered Plan for d/c to [place] and Additional Recommendations: Limitations on Scope of Treatment: Full Comfort Care  Code Status: DNR  Prognosis:  < 2 weeks  Discharge Planning: Hospice facility  Care plan was discussed with patient's family yesterday.  Thank you for allowing the Palliative Medicine Team to assist in the care of this patient.   Total time: 22 minutes Greater than 50%  of this time was spent counseling and coordinating care related to the above assessment and plan.  Mariana Kaufman, AGNP-C Palliative Medicine   Please contact Palliative Medicine Team phone at [phone] for questions and concerns.

## 2020-11-07 NOTE — Progress Notes (Addendum)
Manufacturing engineer [facility])  [facility] has a bed to offer Mr. Chrissie Noa today.  Spoke with his sister Governor Specking, she confirmed that the family would like to accept the bed.  Governor Specking will have to meet with our social worker to complete necessary forms.  Once this is completed, transport can occur--after 4 pm today.  [facility] will update TOC manager once consents are completed so transport can be arranged.  RN staff, you may call report to [phone] at any time, bed is assigned when report is called.  Please fax completed dc summary to [phone].  Venia Carbon RN, BSN, [place] [facility] Liaison   **addendum 330 pm, all necessary consents are completed and transport to [facility] may be arranged for after 4 pm. Hospital team update via secure Epic chat.

## 2020-11-07 NOTE — Progress Notes (Signed)
Report called and given to [facility] at [facility]

## 2020-11-08 ENCOUNTER — Other Ambulatory Visit (INDEPENDENT_AMBULATORY_CARE_PROVIDER_SITE_OTHER): Payer: Self-pay | Admitting: Primary Care

## 2020-11-26 ENCOUNTER — Emergency Department (HOSPITAL_COMMUNITY)
Admission: EM | Admit: 2020-11-26 | Discharge: 2020-11-27 | Disposition: A | Discharge status: Home / Self Care | Payer: Medicare Other | Attending: Emergency Medicine | Admitting: Emergency Medicine

## 2020-11-26 ENCOUNTER — Emergency Department (HOSPITAL_COMMUNITY): Payer: Medicare Other

## 2020-11-26 ENCOUNTER — Encounter (HOSPITAL_COMMUNITY): Payer: Self-pay | Admitting: Emergency Medicine

## 2020-11-26 DIAGNOSIS — E8771 Transfusion associated circulatory overload: Secondary | ICD-10-CM | POA: Insufficient documentation

## 2020-11-26 DIAGNOSIS — D649 Anemia, unspecified: Secondary | ICD-10-CM

## 2020-11-26 DIAGNOSIS — I5032 Chronic diastolic (congestive) heart failure: Secondary | ICD-10-CM | POA: Insufficient documentation

## 2020-11-26 DIAGNOSIS — Z79899 Other long term (current) drug therapy: Secondary | ICD-10-CM | POA: Insufficient documentation

## 2020-11-26 DIAGNOSIS — I110 Hypertensive heart disease with heart failure: Secondary | ICD-10-CM | POA: Insufficient documentation

## 2020-11-26 LAB — CBC WITH DIFFERENTIAL/PLATELET
Abs Immature Granulocytes: 0.06 10*3/uL (ref 0.00–0.07)
Basophils Absolute: 0 10*3/uL (ref 0.0–0.1)
Basophils Relative: 0 %
Eosinophils Absolute: 0.4 10*3/uL (ref 0.0–0.5)
Eosinophils Relative: 4 %
HCT: 22.9 % — ABNORMAL LOW (ref 39.0–52.0)
Hemoglobin: 6.9 g/dL — CL (ref 13.0–17.0)
Immature Granulocytes: 1 %
Lymphocytes Relative: 16 %
Lymphs Abs: 1.6 10*3/uL (ref 0.7–4.0)
MCH: 27.6 pg (ref 26.0–34.0)
MCHC: 30.1 g/dL (ref 30.0–36.0)
MCV: 91.6 fL (ref 80.0–100.0)
Monocytes Absolute: 0.9 10*3/uL (ref 0.1–1.0)
Monocytes Relative: 10 %
Neutro Abs: 6.8 10*3/uL (ref 1.7–7.7)
Neutrophils Relative %: 69 %
Platelets: 634 10*3/uL — ABNORMAL HIGH (ref 150–400)
RBC: 2.5 MIL/uL — ABNORMAL LOW (ref 4.22–5.81)
RDW: 17 % — ABNORMAL HIGH (ref 11.5–15.5)
WBC: 9.8 10*3/uL (ref 4.0–10.5)
nRBC: 0 % (ref 0.0–0.2)

## 2020-11-26 LAB — COMPREHENSIVE METABOLIC PANEL
ALT: 12 U/L (ref 0–44)
AST: 18 U/L (ref 15–41)
Albumin: 2.1 g/dL — ABNORMAL LOW (ref 3.5–5.0)
Alkaline Phosphatase: 92 U/L (ref 38–126)
Anion gap: 9 (ref 5–15)
BUN: 17 mg/dL (ref 8–23)
CO2: 27 mmol/L (ref 22–32)
Calcium: 9.7 mg/dL (ref 8.9–10.3)
Chloride: 103 mmol/L (ref 98–111)
Creatinine, Ser: 0.78 mg/dL (ref 0.61–1.24)
GFR, Estimated: >60 mL/min (ref >60)
Glucose, Bld: 108 mg/dL — ABNORMAL HIGH (ref 70–99)
Potassium: 3.5 mmol/L (ref 3.5–5.1)
Sodium: 139 mmol/L (ref 135–145)
Total Bilirubin: 0.5 mg/dL (ref 0.3–1.2)
Total Protein: 7.8 g/dL (ref 6.5–8.1)

## 2020-11-26 LAB — PREPARE RBC (CROSSMATCH)

## 2020-11-26 LAB — POC OCCULT BLOOD, ED: Fecal Occult Bld: NEGATIVE

## 2020-11-26 LAB — PROTIME-INR
INR: 1.1 (ref 0.8–1.2)
Prothrombin Time: 14.4 seconds (ref 11.4–15.2)

## 2020-11-26 MED ORDER — SODIUM CHLORIDE 0.9 % IV SOLN: 10.0000 mL/h | Freq: Once | INTRAVENOUS | Status: DC

## 2020-11-26 MED ORDER — SODIUM CHLORIDE 0.9 % IV BOLUS
1000.0000 mL | Freq: Once | INTRAVENOUS | Status: AC
  Administered 2020-11-26: 1000 mL via INTRAVENOUS

## 2020-11-26 NOTE — ED Notes (Signed)
RN notified of abnormal lab 

## 2020-11-26 NOTE — ED Triage Notes (Signed)
Pt BIB EMS from [facility] c/o low hgb (6.2 per facility). Denies dizziness, weakness, shob. VSS. DNR.

## 2020-11-26 NOTE — ED Provider Notes (Signed)
Assumed care at shift change.  See prior notes for full H&P.  Briefly, 70 y.o. M here with abnormal outpatient labs.  Hemoglobin today 6.9.  He is currently on hospice care at [facility].  Acute anemia felt to be related to blood loss from chronic wound of left hip.  Options given to family regarding admission vs transfusion and discharge back to facility, they would like transfusion in ED and discharge back to SNF.  Plan:  Getting transfusion now, will observe.  Anticipate discharge if no acute issues.  Transfusion complete.  He has been monitored here without issue.  VSS.  Appropriate for discharge back to SNF as per prior team.   Larene Pickett, PA-C 81/85/63 1497    Delora Fuel, MD 02/63/78 [phone]

## 2020-11-26 NOTE — ED Provider Notes (Signed)
[facility] DEPT Provider Note   CSN: [id number] Arrival date & time: 11/26/20  1616     History Chief Complaint  Patient presents with  . Abnormal Lab    Raymond Stone is a 70 y.o. male.  HPI 70 year old male with a history of hypertension, anemia, hyponatremia, hyperkalemia, chronic left hip ulcer presents to the ER from [facility] with reports of a hemoglobin of 6.2.  Patient denies any history of GI bleed.  No noticeable dark or tarry stools.  Overall feeling well, no dizziness, weakness, shortness of breath.  Per chart review, patient was admitted last month, he was placed on hospice, and transferred to [facility].  He is currently followed by wound care who did draw some blood work and noted the incidental finding of a hemoglobin of 6.2.    Past Medical History:  Diagnosis Date  . Anemia   . Eczema   . Fluid collection (edema) in the arms, legs, hands and feet   . Hypertension     Patient Active Problem List   Diagnosis Date Noted  . Polyarticular gout   . SIRS (systemic inflammatory response syndrome) (HCC)   . Polyarthralgia   . History of gout   . Fever, unknown origin 10/27/2020  . Cerebral atrophy ([place]) 10/26/2020  . Failure to thrive in adult 10/26/2020  . Hypernatremia 10/18/2020  . Hyperkalemia 10/18/2020  . Acute metabolic encephalopathy 37/62/8315  . ATN (acute tubular necrosis) ([place]) 10/18/2020  . Malnutrition of moderate degree 10/17/2020  . Pressure injury of skin 10/13/2020  . Angioedema 05/22/2017  . 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 Endotracheally intubated   . History of alcohol dependence ([place]) 05/17/2017  . Leukocytosis 01/27/2017  . AKI (acute kidney injury) ([place]) 01/24/2017  . 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 Alcohol dependence ([place]) 01/24/2017  . Normocytic anemia 01/24/2017  . Thrombocytosis 01/24/2017  . Chronic diastolic CHF (congestive heart failure)  ([place]) 01/24/2017  . Scrotal edema 10/20/2016  . Nephrotic syndrome 10/20/2016  . Essential hypertension 01/07/2016  . Severe eczema 04/16/2014    Past Surgical History:  Procedure Laterality Date  . COLONOSCOPY    . FINGER SURGERY Right 2013  . I & D EXTREMITY Right 09/13/2012   Procedure: IRRIGATION AND DEBRIDEMENT EXTREMITY  RIGHT MIDDLE FINGER WITH REVISION AMPUTATION AND SKIN GRAFTING.;  Surgeon: Roseanne Kaufman, MD;  Location: [facility];  Service: Orthopedics;  Laterality: Right;       Family History  Problem Relation Age of Onset  . Lupus Daughter   . Stomach cancer Brother   . Colon cancer Neg Hx   . Colon polyps Neg Hx   . Esophageal cancer Neg Hx   . Rectal cancer Neg Hx     Social History   Tobacco Use  . Smoking status: Never Smoker  . Smokeless tobacco: Never Used  Vaping Use  . Vaping Use: Never used  Substance Use Topics  . Alcohol use: Yes    Alcohol/week: 3.0 - 4.0 standard drinks    Types: 3 - 4 Cans of beer per week    Comment: beer everyday  . Drug use: No    Home Medications Prior to Admission medications   Medication Sig Start Date End Date Taking? Authorizing Provider  acetaminophen (TYLENOL) 325 MG tablet Take 650 mg by mouth every 4 (four) hours as needed (for pain).   Yes [provider]  Amino Acids-Protein Hydrolys (PRO-STAT) LIQD Take 30 mLs by mouth  3 (three) times daily.   Yes [provider]  LORazepam (ATIVAN) 1 MG tablet Take 1 mg by mouth every 4 (four) hours as needed for anxiety.   Yes [provider]  Morphine Sulfate (MORPHINE CONCENTRATE) 10 mg / 0.5 ml concentrated solution Take 5-10 mg by mouth See admin instructions. Take 5 mg by mouth every four hours as needed for mild pain or dyspnea and 10 mg for moderate pain 11/07/20  Yes [provider]  Multiple Vitamin (MULTIVITAMIN) tablet Take 1 tablet by mouth daily with breakfast.   Yes [provider]  vitamin C (ASCORBIC ACID) 500 MG tablet  Take 500 mg by mouth 2 (two) times daily.   Yes [provider]  zinc sulfate 220 (50 Zn) MG capsule Take 220 mg by mouth daily. 11/27/20  Yes [provider]  amLODipine (NORVASC) 10 MG tablet Take 1 tablet (10 mg total) by mouth daily. Patient not taking: Reported on 11/26/2020 07/24/20   Grayce Sessions, NP  colchicine 0.6 MG tablet Take 1 tablet (0.6 mg total) by mouth 2 (two) times daily. Patient not taking: Reported on 11/26/2020 11/07/20   Meredeth Ide, MD  metoprolol tartrate (LOPRESSOR) 25 MG tablet TAKE 1 TABLET(25 MG) BY MOUTH THREE TIMES DAILY Patient not taking: Reported on 11/26/2020 11/08/20   Grayce Sessions, NP  mirtazapine (REMERON SOL-TAB) 15 MG disintegrating tablet Take 1 tablet (15 mg total) by mouth at bedtime. Patient not taking: Reported on 11/26/2020 11/07/20   Meredeth Ide, MD    Allergies    Lisinopril  Review of Systems   Review of Systems  Constitutional: Negative for chills and fever.  HENT: Negative for ear pain and sore throat.   Eyes: Negative for pain and visual disturbance.  Respiratory: Negative for cough and shortness of breath.   Cardiovascular: Negative for chest pain and palpitations.  Gastrointestinal: Negative for abdominal pain, anal bleeding, blood in stool and vomiting.  Genitourinary: Negative for dysuria and hematuria.  Musculoskeletal: Negative for arthralgias and back pain.  Skin: Positive for color change and wound. Negative for rash.  Neurological: Negative for seizures and syncope.  All other systems reviewed and are negative.   Physical Exam Updated Vital Signs BP 116/72   Pulse (!) 104   Temp 98.6 F (37 C) (Oral)   Resp 16   SpO2 98%   Physical Exam Vitals and nursing note reviewed.  Constitutional:      General: He is not in acute distress.    Appearance: He is well-developed. He is not ill-appearing, toxic-appearing or diaphoretic.  HENT:     Head: Normocephalic and atraumatic.  Eyes:      Conjunctiva/sclera: Conjunctivae normal.  Cardiovascular:     Rate and Rhythm: Normal rate and regular rhythm.     Heart sounds: No murmur heard.   Pulmonary:     Effort: Pulmonary effort is normal. No respiratory distress.     Breath sounds: Normal breath sounds.  Abdominal:     General: Abdomen is flat.     Palpations: Abdomen is soft.     Tenderness: There is no abdominal tenderness.  Genitourinary:    Rectum: Guaiac result negative.     Comments: Rectal exam performed by me with RN and NT at bedside, no visible frank blood, no melena Musculoskeletal:        General: Normal range of motion.     Cervical back: Neck supple.  Skin:    General: Skin is warm  and dry.     Findings: Erythema and lesion present.     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  Visible packing.  No visible signs of excessive drainage, discharge.  No active bleeding.  Neurological:     General: No focal deficit present.     Mental Status: He is alert and oriented to person, place, and time.  Psychiatric:        Mood and Affect: Mood normal.        Behavior: Behavior normal.     ED Results / Procedures / Treatments   Labs (all labs ordered are listed, but only abnormal results are displayed) Labs Reviewed  COMPREHENSIVE METABOLIC PANEL - Abnormal; Notable for the following components:      Result Value   Glucose, Bld 108 (*)    Albumin 2.1 (*)    All other components within normal limits  CBC WITH DIFFERENTIAL/PLATELET - Abnormal; Notable for the following components:   RBC 2.50 (*)    Hemoglobin 6.9 (*)    HCT 22.9 (*)    RDW 17.0 (*)    Platelets 634 (*)    All other components within normal limits  PROTIME-INR  POC OCCULT BLOOD, ED  TYPE AND SCREEN  PREPARE RBC (CROSSMATCH)    EKG None  Radiology DG Chest Portable 1 View  Result Date: 11/26/2020 CLINICAL DATA:  Low hemoglobin EXAM: PORTABLE CHEST 1 VIEW COMPARISON:  11/02/2020 FINDINGS: The  heart size and mediastinal contours are within normal limits. Both lungs are clear. The visualized skeletal structures are unremarkable. IMPRESSION: No active disease. Electronically Signed   By: Donavan Foil M.D.   On: 11/26/2020 19:39    Procedures Procedures   Medications Ordered in ED Medications  sodium chloride 0.9 % bolus 1,000 mL (has no administration in time range)  0.9 %  sodium chloride infusion (0 mL/hr Intravenous Hold 11/26/20 1903)    ED Course  I have reviewed the triage vital signs and the nursing notes.  Pertinent labs & imaging results that were available during my care of the patient were reviewed by me and considered in my medical decision making (see chart for details).    MDM Rules/Calculators/A&P                          70 year old male presents to the ER with a reported hemoglobin of 6.2.  He has no complaints, overall feeling well, no dizziness, chest pain, shortness of breath.  He did arrive tachycardic with a rate of 110, blood pressure stable.  Tachycardia improved throughout the ED course.  No visible frank blood or melena during rectal exam.  No active bleeding from the wound.  Guaiac negative stool.  CMP without any significant abnormalities other than an albumin of 2.1, which appears to be chronic.  CBC with a hemoglobin of 6.9.  Chest x-ray without acute abnormalities.  EKG with sinus tach.  Patient was also seen and evaluated by Dr. Darl Householder.  We had a shared decision-making conversation with the patient and his wife at bedside.  Given patient is on hospice, plan for transfusion 1 unit of PRBCs, and plan for discharge back to facility.  Suspect source of bleeding is his wound.  Patient and wife are agreeable to this plan.  On reevaluation, patient still receiving transfusion.  Care signed out to [facility] who will oversee his transfusion, observe, and discharge if no complications.  This was a shared visit with my supervising physician Dr.Yao who  independently saw and evaluated the patient & provided guidance in evaluation/management/disposition ,in agreement with care  Final Clinical Impression(s) / ED Diagnoses Final diagnoses:  Anemia requiring transfusions    Rx / DC Orders ED Discharge Orders    None       Lyndel Safe 11/26/20 2236    Drenda Freeze, MD 11/26/20 2330

## 2020-11-26 NOTE — ED Notes (Signed)
Per Main Lab BB- bld units ready for p/u. RN advised. [employer]

## 2020-11-27 DIAGNOSIS — E8771 Transfusion associated circulatory overload: Secondary | ICD-10-CM | POA: Diagnosis not present

## 2020-11-27 LAB — BPAM RBC
Blood Product Expiration Date: 202206142359
ISSUE DATE / TIME: 202205171945
Unit Type and Rh: 7300

## 2020-11-27 LAB — TYPE AND SCREEN
ABO/RH(D): B POS
Antibody Screen: NEGATIVE
Unit division: 0

## 2020-11-27 NOTE — ED Notes (Signed)
[facility] called for transport.  

## 2020-11-27 NOTE — ED Notes (Addendum)
RN called [place] facility and spoke to [place], [place]. She is aware of patients return.

## 2020-11-27 NOTE — Discharge Instructions (Signed)
Please follow-up with your doctor for re-check of hemoglobin.

## 2021-12-21 IMAGING — CT CT HEAD W/O CM
3 of 5 series · 14 of 47 positions shown, 16 images · non-contrast
Comparison: None.

CLINICAL DATA: Generalized weakness

EXAM:
CT HEAD WITHOUT CONTRAST
CT CERVICAL SPINE WITHOUT CONTRAST
TECHNIQUE: Multidetector CT imaging of the head and cervical spine was
performed following the standard protocol without intravenous
contrast. Multiplanar CT image reconstructions of the cervical spine
were also generated.

[Series 3: head 5.0 h30s · axial · 0.45mm/px · z∈[-64,+56]mm · 8 of 30 slices shown, 10 images]
[im 3/30  brain]
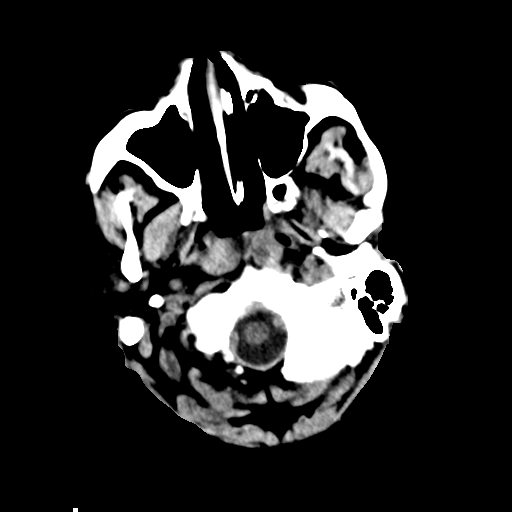
[im 3/30  bone]
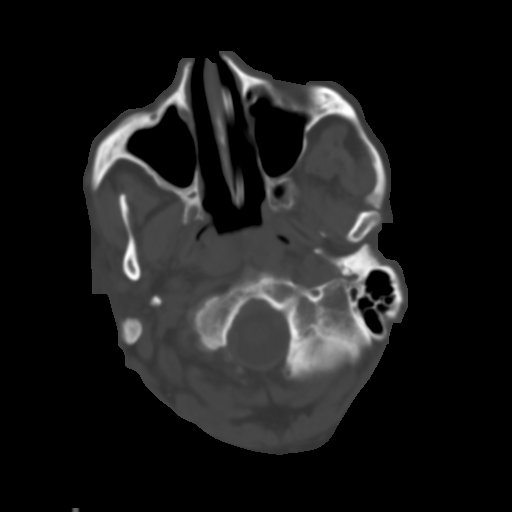
[im 7/30  brain]
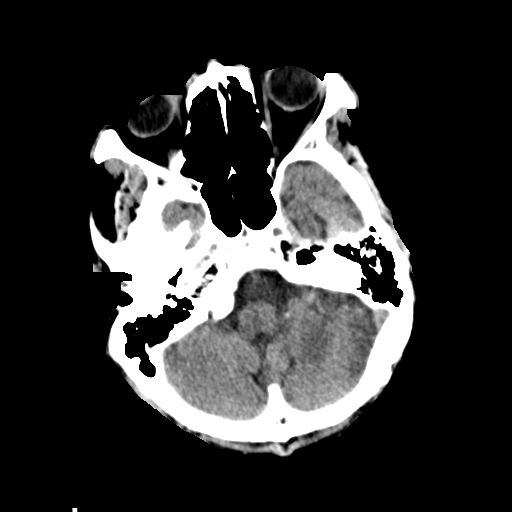
[im 11/30  brain]
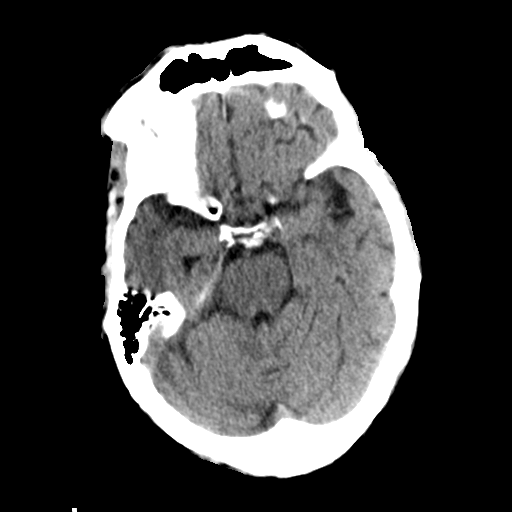
[im 13/30  brain]
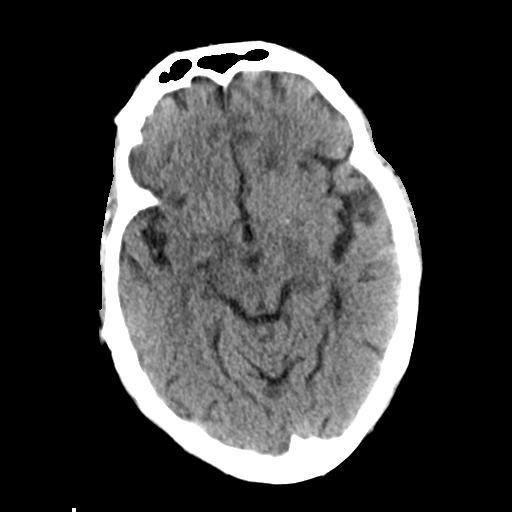
[im 17/30  brain]
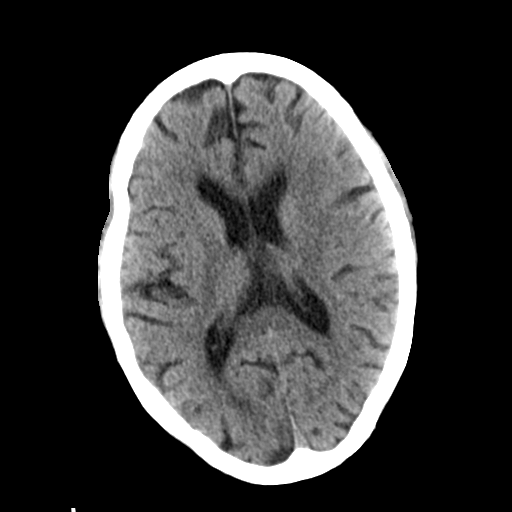
[im 17/30  bone]
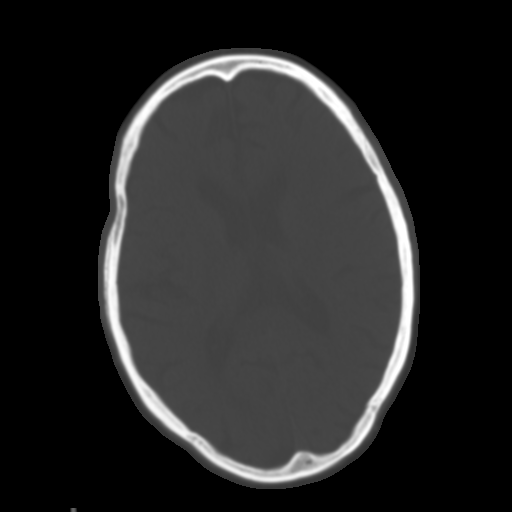
[im 19/30  brain]
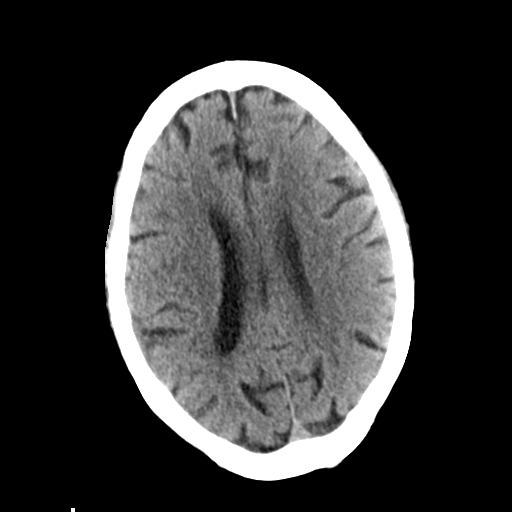
[im 23/30  brain]
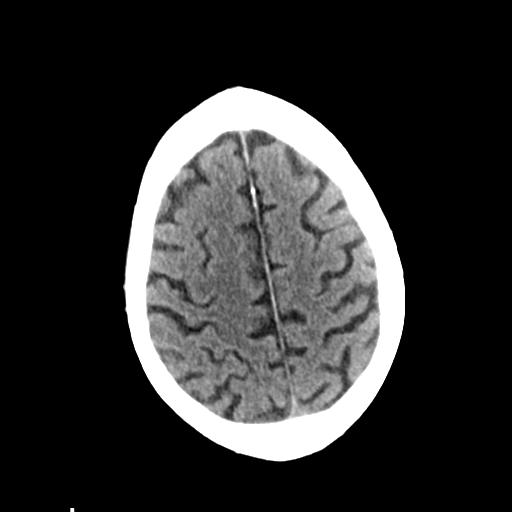
[im 27/30  brain]
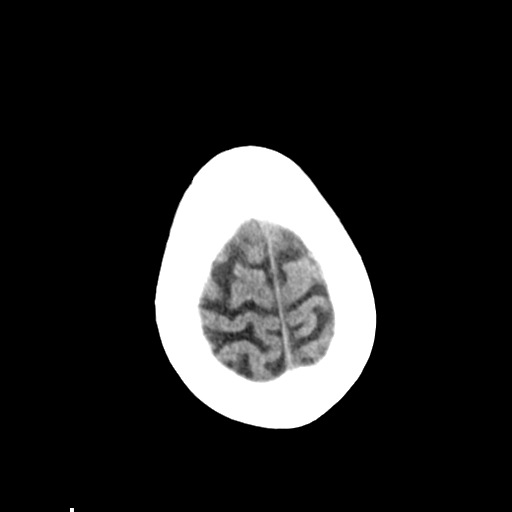

[Series 6: head 3.0 mpr cor · coronal · 0.29mm/px · 3 of 75 slices shown]
[im 19/75  brain]
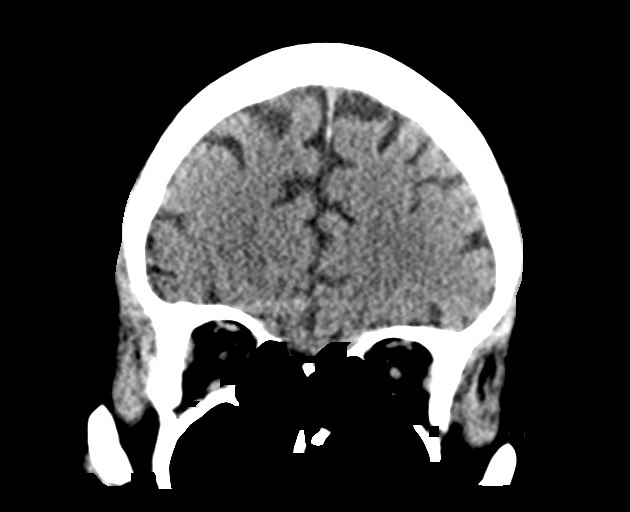
[im 38/75  brain]
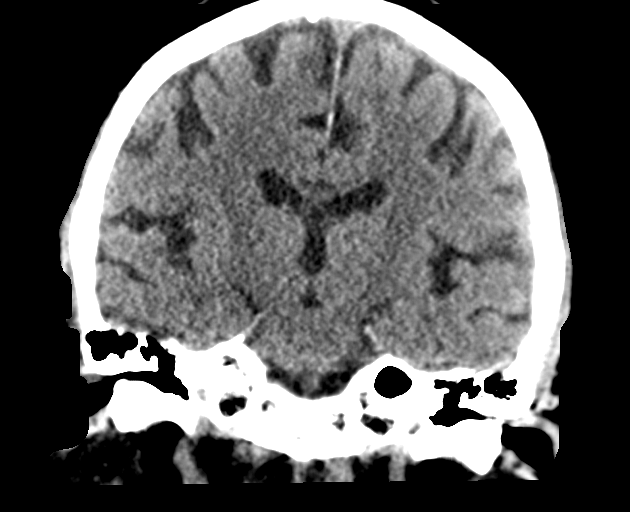
[im 56/75  brain]
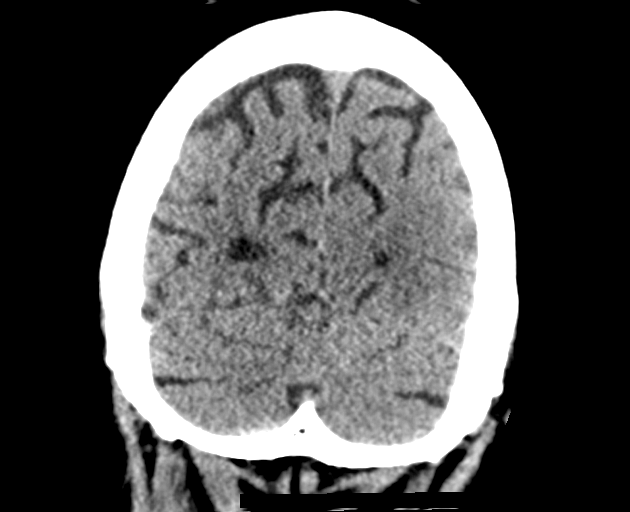

[Series 7: head 3.0 mpr sag · sagittal · 0.29mm/px · 3 of 67 slices shown]
[im 23/67  brain]
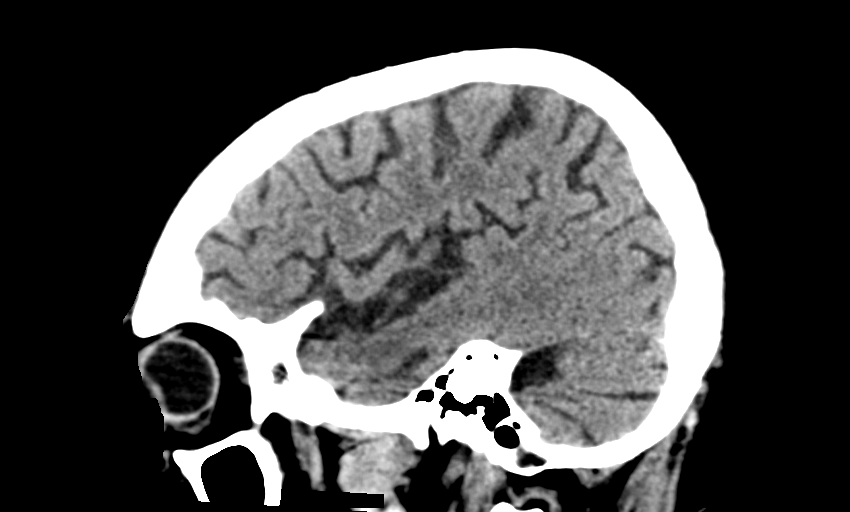
[im 34/67  brain]
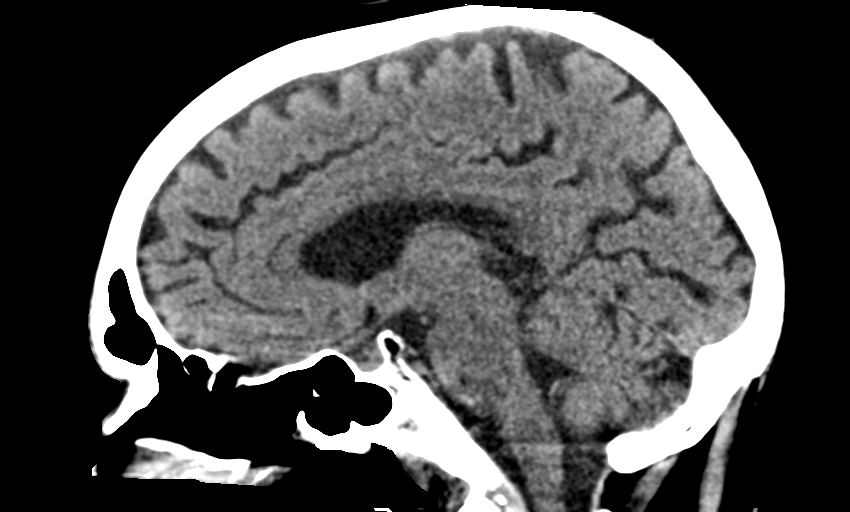
[im 45/67  brain]
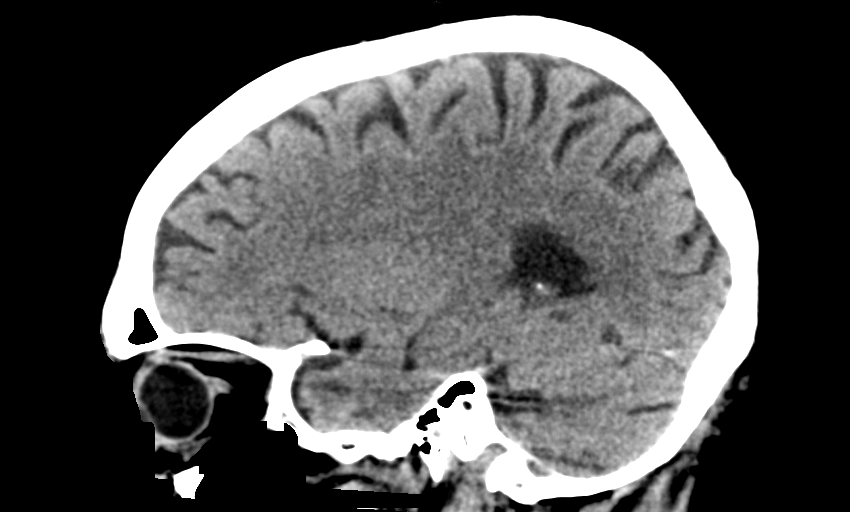

[14 of 47 positions shown; findings below may reference images not displayed]

FINDINGS: CT HEAD FINDINGS

Brain: There is no mass, hemorrhage or extra-axial collection. The
size and configuration of the ventricles and extra-axial CSF spaces
are normal. The brain parenchyma is normal, without evidence of
acute or chronic infarction.

Vascular: No abnormal hyperdensity of the major intracranial
arteries or dural venous sinuses. No intracranial atherosclerosis.

Skull: The visualized skull base, calvarium and extracranial soft
tissues are normal.

Sinuses/Orbits: No fluid levels or advanced mucosal thickening of
the visualized paranasal sinuses. No mastoid or middle ear effusion.
The orbits are normal.

CT CERVICAL SPINE FINDINGS

Alignment: No static subluxation. Facets are aligned. Occipital
condyles are normally positioned.

Skull base and vertebrae: No acute fracture. Erosive change of the
dens.

Soft tissues and spinal canal: No prevertebral fluid or swelling. No
visible canal hematoma.

Disc levels: Multilevel anterior osteophytosis. There is fusion of
the left facets at C4-5 and fusion of the right facets at C3-4.
There is erosive change of the dens. There is severe right C2-3
facet arthrosis.

Upper chest: No pneumothorax, pulmonary nodule or pleural effusion.

Other: Normal visualized paraspinal cervical soft tissues.
IMPRESSION: 1. No acute intracranial abnormality.
2. No acute fracture or static subluxation of the cervical spine.
3. Erosive change of the dens, which may be a sequela of CPPD or
rheumatoid arthritis.

## 2022-05-20 ENCOUNTER — Encounter: Payer: Self-pay | Admitting: Gastroenterology

## 2023-11-30 DIAGNOSIS — R7989 Other specified abnormal findings of blood chemistry: Secondary | ICD-10-CM | POA: Diagnosis not present

## 2023-11-30 DIAGNOSIS — L309 Dermatitis, unspecified: Secondary | ICD-10-CM | POA: Diagnosis not present

## 2023-11-30 DIAGNOSIS — D509 Iron deficiency anemia, unspecified: Secondary | ICD-10-CM | POA: Diagnosis not present

## 2023-11-30 DIAGNOSIS — R2689 Other abnormalities of gait and mobility: Secondary | ICD-10-CM | POA: Diagnosis not present

## 2023-11-30 DIAGNOSIS — E559 Vitamin D deficiency, unspecified: Secondary | ICD-10-CM | POA: Diagnosis not present

## 2023-11-30 DIAGNOSIS — Z131 Encounter for screening for diabetes mellitus: Secondary | ICD-10-CM | POA: Diagnosis not present

## 2023-11-30 DIAGNOSIS — Z1211 Encounter for screening for malignant neoplasm of colon: Secondary | ICD-10-CM | POA: Diagnosis not present

## 2023-11-30 DIAGNOSIS — Z0000 Encounter for general adult medical examination without abnormal findings: Secondary | ICD-10-CM | POA: Diagnosis not present

## 2023-11-30 DIAGNOSIS — I10 Essential (primary) hypertension: Secondary | ICD-10-CM | POA: Diagnosis not present

## 2023-11-30 DIAGNOSIS — R293 Abnormal posture: Secondary | ICD-10-CM | POA: Diagnosis not present

## 2023-12-07 DIAGNOSIS — I10 Essential (primary) hypertension: Secondary | ICD-10-CM | POA: Diagnosis not present

## 2023-12-10 DIAGNOSIS — I10 Essential (primary) hypertension: Secondary | ICD-10-CM | POA: Diagnosis not present

## 2023-12-15 DIAGNOSIS — I10 Essential (primary) hypertension: Secondary | ICD-10-CM | POA: Diagnosis not present

## 2023-12-23 DIAGNOSIS — M40204 Unspecified kyphosis, thoracic region: Secondary | ICD-10-CM | POA: Diagnosis not present

## 2023-12-23 DIAGNOSIS — I10 Essential (primary) hypertension: Secondary | ICD-10-CM | POA: Diagnosis not present

## 2023-12-23 DIAGNOSIS — D509 Iron deficiency anemia, unspecified: Secondary | ICD-10-CM | POA: Diagnosis not present

## 2023-12-23 DIAGNOSIS — E559 Vitamin D deficiency, unspecified: Secondary | ICD-10-CM | POA: Diagnosis not present

## 2024-01-04 DIAGNOSIS — D509 Iron deficiency anemia, unspecified: Secondary | ICD-10-CM | POA: Diagnosis not present

## 2024-01-04 DIAGNOSIS — M40204 Unspecified kyphosis, thoracic region: Secondary | ICD-10-CM | POA: Diagnosis not present

## 2024-01-04 DIAGNOSIS — I10 Essential (primary) hypertension: Secondary | ICD-10-CM | POA: Diagnosis not present

## 2024-01-04 DIAGNOSIS — E559 Vitamin D deficiency, unspecified: Secondary | ICD-10-CM | POA: Diagnosis not present

## 2024-01-12 DIAGNOSIS — E559 Vitamin D deficiency, unspecified: Secondary | ICD-10-CM | POA: Diagnosis not present

## 2024-01-12 DIAGNOSIS — I10 Essential (primary) hypertension: Secondary | ICD-10-CM | POA: Diagnosis not present

## 2024-01-12 DIAGNOSIS — D509 Iron deficiency anemia, unspecified: Secondary | ICD-10-CM | POA: Diagnosis not present

## 2024-01-12 DIAGNOSIS — M40204 Unspecified kyphosis, thoracic region: Secondary | ICD-10-CM | POA: Diagnosis not present

## 2024-01-21 DIAGNOSIS — E559 Vitamin D deficiency, unspecified: Secondary | ICD-10-CM | POA: Diagnosis not present

## 2024-01-21 DIAGNOSIS — I10 Essential (primary) hypertension: Secondary | ICD-10-CM | POA: Diagnosis not present

## 2024-01-21 DIAGNOSIS — D509 Iron deficiency anemia, unspecified: Secondary | ICD-10-CM | POA: Diagnosis not present

## 2024-01-21 DIAGNOSIS — M40204 Unspecified kyphosis, thoracic region: Secondary | ICD-10-CM | POA: Diagnosis not present

## 2024-01-23 DIAGNOSIS — Z743 Need for continuous supervision: Secondary | ICD-10-CM | POA: Diagnosis not present

## 2024-01-23 DIAGNOSIS — L309 Dermatitis, unspecified: Secondary | ICD-10-CM | POA: Diagnosis not present

## 2024-01-23 DIAGNOSIS — L308 Other specified dermatitis: Secondary | ICD-10-CM | POA: Diagnosis not present

## 2024-01-23 DIAGNOSIS — L299 Pruritus, unspecified: Secondary | ICD-10-CM | POA: Diagnosis not present

## 2024-01-27 DIAGNOSIS — D509 Iron deficiency anemia, unspecified: Secondary | ICD-10-CM | POA: Diagnosis not present

## 2024-01-27 DIAGNOSIS — E559 Vitamin D deficiency, unspecified: Secondary | ICD-10-CM | POA: Diagnosis not present

## 2024-01-27 DIAGNOSIS — M40204 Unspecified kyphosis, thoracic region: Secondary | ICD-10-CM | POA: Diagnosis not present

## 2024-01-27 DIAGNOSIS — I10 Essential (primary) hypertension: Secondary | ICD-10-CM | POA: Diagnosis not present

## 2024-02-04 DIAGNOSIS — D509 Iron deficiency anemia, unspecified: Secondary | ICD-10-CM | POA: Diagnosis not present

## 2024-02-04 DIAGNOSIS — M40204 Unspecified kyphosis, thoracic region: Secondary | ICD-10-CM | POA: Diagnosis not present

## 2024-02-04 DIAGNOSIS — E559 Vitamin D deficiency, unspecified: Secondary | ICD-10-CM | POA: Diagnosis not present

## 2024-02-04 DIAGNOSIS — I10 Essential (primary) hypertension: Secondary | ICD-10-CM | POA: Diagnosis not present

## 2024-02-16 DIAGNOSIS — S91302A Unspecified open wound, left foot, initial encounter: Secondary | ICD-10-CM | POA: Diagnosis not present

## 2024-02-16 DIAGNOSIS — S0990XA Unspecified injury of head, initial encounter: Secondary | ICD-10-CM | POA: Diagnosis not present

## 2024-02-16 DIAGNOSIS — R600 Localized edema: Secondary | ICD-10-CM | POA: Diagnosis not present

## 2024-02-16 DIAGNOSIS — S91301A Unspecified open wound, right foot, initial encounter: Secondary | ICD-10-CM | POA: Diagnosis not present

## 2024-02-16 DIAGNOSIS — E538 Deficiency of other specified B group vitamins: Secondary | ICD-10-CM | POA: Diagnosis not present

## 2024-02-16 DIAGNOSIS — R2232 Localized swelling, mass and lump, left upper limb: Secondary | ICD-10-CM | POA: Diagnosis not present

## 2024-02-16 DIAGNOSIS — L97519 Non-pressure chronic ulcer of other part of right foot with unspecified severity: Secondary | ICD-10-CM | POA: Diagnosis not present

## 2024-02-16 DIAGNOSIS — M009 Pyogenic arthritis, unspecified: Secondary | ICD-10-CM | POA: Diagnosis not present

## 2024-02-16 DIAGNOSIS — Z79899 Other long term (current) drug therapy: Secondary | ICD-10-CM | POA: Diagnosis not present

## 2024-02-16 DIAGNOSIS — L308 Other specified dermatitis: Secondary | ICD-10-CM | POA: Diagnosis not present

## 2024-02-16 DIAGNOSIS — E860 Dehydration: Secondary | ICD-10-CM | POA: Diagnosis not present

## 2024-02-16 DIAGNOSIS — R079 Chest pain, unspecified: Secondary | ICD-10-CM | POA: Diagnosis not present

## 2024-02-16 DIAGNOSIS — R531 Weakness: Secondary | ICD-10-CM | POA: Diagnosis not present

## 2024-02-16 DIAGNOSIS — M1A9XX1 Chronic gout, unspecified, with tophus (tophi): Secondary | ICD-10-CM | POA: Diagnosis not present

## 2024-02-16 DIAGNOSIS — D539 Nutritional anemia, unspecified: Secondary | ICD-10-CM | POA: Diagnosis not present

## 2024-02-16 DIAGNOSIS — L309 Dermatitis, unspecified: Secondary | ICD-10-CM | POA: Diagnosis not present

## 2024-02-16 DIAGNOSIS — R2231 Localized swelling, mass and lump, right upper limb: Secondary | ICD-10-CM | POA: Diagnosis not present

## 2024-02-16 DIAGNOSIS — E870 Hyperosmolality and hypernatremia: Secondary | ICD-10-CM | POA: Diagnosis not present

## 2024-02-16 DIAGNOSIS — N179 Acute kidney failure, unspecified: Secondary | ICD-10-CM | POA: Diagnosis not present

## 2024-02-16 DIAGNOSIS — A419 Sepsis, unspecified organism: Secondary | ICD-10-CM | POA: Diagnosis not present

## 2024-02-16 DIAGNOSIS — R000 Tachycardia, unspecified: Secondary | ICD-10-CM | POA: Diagnosis not present

## 2024-02-16 DIAGNOSIS — Z9181 History of falling: Secondary | ICD-10-CM | POA: Diagnosis not present

## 2024-02-16 DIAGNOSIS — M869 Osteomyelitis, unspecified: Secondary | ICD-10-CM | POA: Diagnosis not present

## 2024-02-16 DIAGNOSIS — E876 Hypokalemia: Secondary | ICD-10-CM | POA: Diagnosis not present

## 2024-02-16 DIAGNOSIS — M109 Gout, unspecified: Secondary | ICD-10-CM | POA: Diagnosis not present

## 2024-02-16 DIAGNOSIS — R55 Syncope and collapse: Secondary | ICD-10-CM | POA: Diagnosis not present

## 2024-02-16 DIAGNOSIS — Z043 Encounter for examination and observation following other accident: Secondary | ICD-10-CM | POA: Diagnosis not present

## 2024-02-16 DIAGNOSIS — M86171 Other acute osteomyelitis, right ankle and foot: Secondary | ICD-10-CM | POA: Diagnosis not present

## 2024-02-16 DIAGNOSIS — R0789 Other chest pain: Secondary | ICD-10-CM | POA: Diagnosis not present

## 2024-02-16 DIAGNOSIS — W19XXXA Unspecified fall, initial encounter: Secondary | ICD-10-CM | POA: Diagnosis not present

## 2024-02-16 DIAGNOSIS — M6282 Rhabdomyolysis: Secondary | ICD-10-CM | POA: Diagnosis not present

## 2024-02-16 DIAGNOSIS — N289 Disorder of kidney and ureter, unspecified: Secondary | ICD-10-CM | POA: Diagnosis not present

## 2024-02-16 DIAGNOSIS — D638 Anemia in other chronic diseases classified elsewhere: Secondary | ICD-10-CM | POA: Diagnosis not present

## 2024-02-16 DIAGNOSIS — E8729 Other acidosis: Secondary | ICD-10-CM | POA: Diagnosis not present

## 2024-02-16 DIAGNOSIS — I10 Essential (primary) hypertension: Secondary | ICD-10-CM | POA: Diagnosis not present

## 2024-02-17 DIAGNOSIS — R000 Tachycardia, unspecified: Secondary | ICD-10-CM | POA: Diagnosis not present

## 2024-02-17 DIAGNOSIS — L308 Other specified dermatitis: Secondary | ICD-10-CM | POA: Diagnosis not present

## 2024-02-17 DIAGNOSIS — M6282 Rhabdomyolysis: Secondary | ICD-10-CM | POA: Diagnosis not present

## 2024-02-17 DIAGNOSIS — N179 Acute kidney failure, unspecified: Secondary | ICD-10-CM | POA: Diagnosis not present

## 2024-02-17 DIAGNOSIS — I10 Essential (primary) hypertension: Secondary | ICD-10-CM | POA: Diagnosis not present

## 2024-02-18 DIAGNOSIS — I10 Essential (primary) hypertension: Secondary | ICD-10-CM | POA: Diagnosis not present

## 2024-02-18 DIAGNOSIS — R000 Tachycardia, unspecified: Secondary | ICD-10-CM | POA: Diagnosis not present

## 2024-02-18 DIAGNOSIS — M6282 Rhabdomyolysis: Secondary | ICD-10-CM | POA: Diagnosis not present

## 2024-02-18 DIAGNOSIS — L308 Other specified dermatitis: Secondary | ICD-10-CM | POA: Diagnosis not present

## 2024-02-18 DIAGNOSIS — N179 Acute kidney failure, unspecified: Secondary | ICD-10-CM | POA: Diagnosis not present

## 2024-02-18 DIAGNOSIS — R0789 Other chest pain: Secondary | ICD-10-CM | POA: Diagnosis not present

## 2024-02-19 DIAGNOSIS — I10 Essential (primary) hypertension: Secondary | ICD-10-CM | POA: Diagnosis not present

## 2024-02-19 DIAGNOSIS — N179 Acute kidney failure, unspecified: Secondary | ICD-10-CM | POA: Diagnosis not present

## 2024-02-19 DIAGNOSIS — R000 Tachycardia, unspecified: Secondary | ICD-10-CM | POA: Diagnosis not present

## 2024-02-19 DIAGNOSIS — L308 Other specified dermatitis: Secondary | ICD-10-CM | POA: Diagnosis not present

## 2024-02-19 DIAGNOSIS — M6282 Rhabdomyolysis: Secondary | ICD-10-CM | POA: Diagnosis not present

## 2024-02-20 DIAGNOSIS — M6282 Rhabdomyolysis: Secondary | ICD-10-CM | POA: Diagnosis not present

## 2024-02-20 DIAGNOSIS — I10 Essential (primary) hypertension: Secondary | ICD-10-CM | POA: Diagnosis not present

## 2024-02-20 DIAGNOSIS — L308 Other specified dermatitis: Secondary | ICD-10-CM | POA: Diagnosis not present

## 2024-02-20 DIAGNOSIS — R000 Tachycardia, unspecified: Secondary | ICD-10-CM | POA: Diagnosis not present

## 2024-02-20 DIAGNOSIS — N179 Acute kidney failure, unspecified: Secondary | ICD-10-CM | POA: Diagnosis not present

## 2024-02-21 DIAGNOSIS — I10 Essential (primary) hypertension: Secondary | ICD-10-CM | POA: Diagnosis not present

## 2024-02-21 DIAGNOSIS — R000 Tachycardia, unspecified: Secondary | ICD-10-CM | POA: Diagnosis not present

## 2024-02-21 DIAGNOSIS — L308 Other specified dermatitis: Secondary | ICD-10-CM | POA: Diagnosis not present

## 2024-02-21 DIAGNOSIS — N179 Acute kidney failure, unspecified: Secondary | ICD-10-CM | POA: Diagnosis not present

## 2024-02-21 DIAGNOSIS — M6282 Rhabdomyolysis: Secondary | ICD-10-CM | POA: Diagnosis not present

## 2024-02-22 DIAGNOSIS — L308 Other specified dermatitis: Secondary | ICD-10-CM | POA: Diagnosis not present

## 2024-02-22 DIAGNOSIS — I10 Essential (primary) hypertension: Secondary | ICD-10-CM | POA: Diagnosis not present

## 2024-02-22 DIAGNOSIS — R000 Tachycardia, unspecified: Secondary | ICD-10-CM | POA: Diagnosis not present

## 2024-02-22 DIAGNOSIS — N179 Acute kidney failure, unspecified: Secondary | ICD-10-CM | POA: Diagnosis not present

## 2024-02-23 DIAGNOSIS — R000 Tachycardia, unspecified: Secondary | ICD-10-CM | POA: Diagnosis not present

## 2024-02-23 DIAGNOSIS — L308 Other specified dermatitis: Secondary | ICD-10-CM | POA: Diagnosis not present

## 2024-02-23 DIAGNOSIS — I10 Essential (primary) hypertension: Secondary | ICD-10-CM | POA: Diagnosis not present

## 2024-02-23 DIAGNOSIS — N179 Acute kidney failure, unspecified: Secondary | ICD-10-CM | POA: Diagnosis not present

## 2024-02-24 DIAGNOSIS — E559 Vitamin D deficiency, unspecified: Secondary | ICD-10-CM | POA: Diagnosis not present

## 2024-02-24 DIAGNOSIS — R278 Other lack of coordination: Secondary | ICD-10-CM | POA: Diagnosis not present

## 2024-02-24 DIAGNOSIS — L309 Dermatitis, unspecified: Secondary | ICD-10-CM | POA: Diagnosis not present

## 2024-02-24 DIAGNOSIS — I119 Hypertensive heart disease without heart failure: Secondary | ICD-10-CM | POA: Diagnosis not present

## 2024-02-24 DIAGNOSIS — M009 Pyogenic arthritis, unspecified: Secondary | ICD-10-CM | POA: Diagnosis not present

## 2024-02-24 DIAGNOSIS — N170 Acute kidney failure with tubular necrosis: Secondary | ICD-10-CM | POA: Diagnosis not present

## 2024-02-24 DIAGNOSIS — W19XXXD Unspecified fall, subsequent encounter: Secondary | ICD-10-CM | POA: Diagnosis not present

## 2024-02-24 DIAGNOSIS — M19049 Primary osteoarthritis, unspecified hand: Secondary | ICD-10-CM | POA: Diagnosis not present

## 2024-02-24 DIAGNOSIS — M109 Gout, unspecified: Secondary | ICD-10-CM | POA: Diagnosis not present

## 2024-02-24 DIAGNOSIS — M19041 Primary osteoarthritis, right hand: Secondary | ICD-10-CM | POA: Diagnosis not present

## 2024-02-24 DIAGNOSIS — R000 Tachycardia, unspecified: Secondary | ICD-10-CM | POA: Diagnosis not present

## 2024-02-24 DIAGNOSIS — I10 Essential (primary) hypertension: Secondary | ICD-10-CM | POA: Diagnosis not present

## 2024-02-24 DIAGNOSIS — L308 Other specified dermatitis: Secondary | ICD-10-CM | POA: Diagnosis not present

## 2024-02-24 DIAGNOSIS — Z5189 Encounter for other specified aftercare: Secondary | ICD-10-CM | POA: Diagnosis not present

## 2024-02-24 DIAGNOSIS — N179 Acute kidney failure, unspecified: Secondary | ICD-10-CM | POA: Diagnosis not present

## 2024-02-24 DIAGNOSIS — Z09 Encounter for follow-up examination after completed treatment for conditions other than malignant neoplasm: Secondary | ICD-10-CM | POA: Diagnosis not present

## 2024-02-24 DIAGNOSIS — W19XXXA Unspecified fall, initial encounter: Secondary | ICD-10-CM | POA: Diagnosis not present

## 2024-02-24 DIAGNOSIS — M6282 Rhabdomyolysis: Secondary | ICD-10-CM | POA: Diagnosis not present

## 2024-02-24 DIAGNOSIS — M6281 Muscle weakness (generalized): Secondary | ICD-10-CM | POA: Diagnosis not present

## 2024-02-24 DIAGNOSIS — M869 Osteomyelitis, unspecified: Secondary | ICD-10-CM | POA: Diagnosis not present

## 2024-02-24 DIAGNOSIS — E860 Dehydration: Secondary | ICD-10-CM | POA: Diagnosis not present

## 2024-02-24 DIAGNOSIS — E538 Deficiency of other specified B group vitamins: Secondary | ICD-10-CM | POA: Diagnosis not present

## 2024-02-25 DIAGNOSIS — L309 Dermatitis, unspecified: Secondary | ICD-10-CM | POA: Diagnosis not present

## 2024-02-25 DIAGNOSIS — W19XXXA Unspecified fall, initial encounter: Secondary | ICD-10-CM | POA: Diagnosis not present

## 2024-02-25 DIAGNOSIS — Z5189 Encounter for other specified aftercare: Secondary | ICD-10-CM | POA: Diagnosis not present

## 2024-02-25 DIAGNOSIS — I10 Essential (primary) hypertension: Secondary | ICD-10-CM | POA: Diagnosis not present

## 2024-02-25 DIAGNOSIS — E538 Deficiency of other specified B group vitamins: Secondary | ICD-10-CM | POA: Diagnosis not present

## 2024-02-25 DIAGNOSIS — M6282 Rhabdomyolysis: Secondary | ICD-10-CM | POA: Diagnosis not present

## 2024-02-25 DIAGNOSIS — E559 Vitamin D deficiency, unspecified: Secondary | ICD-10-CM | POA: Diagnosis not present

## 2024-02-25 DIAGNOSIS — N170 Acute kidney failure with tubular necrosis: Secondary | ICD-10-CM | POA: Diagnosis not present

## 2024-02-28 DIAGNOSIS — I10 Essential (primary) hypertension: Secondary | ICD-10-CM | POA: Diagnosis not present

## 2024-02-28 DIAGNOSIS — N170 Acute kidney failure with tubular necrosis: Secondary | ICD-10-CM | POA: Diagnosis not present

## 2024-02-28 DIAGNOSIS — M6282 Rhabdomyolysis: Secondary | ICD-10-CM | POA: Diagnosis not present

## 2024-02-28 DIAGNOSIS — M009 Pyogenic arthritis, unspecified: Secondary | ICD-10-CM | POA: Diagnosis not present

## 2024-02-28 DIAGNOSIS — M6281 Muscle weakness (generalized): Secondary | ICD-10-CM | POA: Diagnosis not present

## 2024-02-28 DIAGNOSIS — L309 Dermatitis, unspecified: Secondary | ICD-10-CM | POA: Diagnosis not present

## 2024-02-28 DIAGNOSIS — E860 Dehydration: Secondary | ICD-10-CM | POA: Diagnosis not present

## 2024-03-01 DIAGNOSIS — Z5189 Encounter for other specified aftercare: Secondary | ICD-10-CM | POA: Diagnosis not present

## 2024-03-01 DIAGNOSIS — M009 Pyogenic arthritis, unspecified: Secondary | ICD-10-CM | POA: Diagnosis not present

## 2024-03-01 DIAGNOSIS — I10 Essential (primary) hypertension: Secondary | ICD-10-CM | POA: Diagnosis not present

## 2024-03-03 DIAGNOSIS — E559 Vitamin D deficiency, unspecified: Secondary | ICD-10-CM | POA: Diagnosis not present

## 2024-03-03 DIAGNOSIS — Z5189 Encounter for other specified aftercare: Secondary | ICD-10-CM | POA: Diagnosis not present

## 2024-03-03 DIAGNOSIS — I10 Essential (primary) hypertension: Secondary | ICD-10-CM | POA: Diagnosis not present

## 2024-03-03 DIAGNOSIS — M009 Pyogenic arthritis, unspecified: Secondary | ICD-10-CM | POA: Diagnosis not present

## 2024-03-08 DIAGNOSIS — E559 Vitamin D deficiency, unspecified: Secondary | ICD-10-CM | POA: Diagnosis not present

## 2024-03-08 DIAGNOSIS — I10 Essential (primary) hypertension: Secondary | ICD-10-CM | POA: Diagnosis not present

## 2024-03-10 DIAGNOSIS — M009 Pyogenic arthritis, unspecified: Secondary | ICD-10-CM | POA: Diagnosis not present

## 2024-03-15 DIAGNOSIS — M19041 Primary osteoarthritis, right hand: Secondary | ICD-10-CM | POA: Diagnosis not present

## 2024-03-15 DIAGNOSIS — E559 Vitamin D deficiency, unspecified: Secondary | ICD-10-CM | POA: Diagnosis not present

## 2024-03-15 DIAGNOSIS — M869 Osteomyelitis, unspecified: Secondary | ICD-10-CM | POA: Diagnosis not present

## 2024-03-17 DIAGNOSIS — M19041 Primary osteoarthritis, right hand: Secondary | ICD-10-CM | POA: Diagnosis not present

## 2024-03-17 DIAGNOSIS — I10 Essential (primary) hypertension: Secondary | ICD-10-CM | POA: Diagnosis not present

## 2024-03-24 DIAGNOSIS — E559 Vitamin D deficiency, unspecified: Secondary | ICD-10-CM | POA: Diagnosis not present

## 2024-03-24 DIAGNOSIS — M19049 Primary osteoarthritis, unspecified hand: Secondary | ICD-10-CM | POA: Diagnosis not present

## 2024-03-24 DIAGNOSIS — I10 Essential (primary) hypertension: Secondary | ICD-10-CM | POA: Diagnosis not present

## 2024-04-03 DIAGNOSIS — Z09 Encounter for follow-up examination after completed treatment for conditions other than malignant neoplasm: Secondary | ICD-10-CM | POA: Diagnosis not present

## 2024-04-03 DIAGNOSIS — E559 Vitamin D deficiency, unspecified: Secondary | ICD-10-CM | POA: Diagnosis not present

## 2024-04-03 DIAGNOSIS — M6282 Rhabdomyolysis: Secondary | ICD-10-CM | POA: Diagnosis not present

## 2024-04-03 DIAGNOSIS — I119 Hypertensive heart disease without heart failure: Secondary | ICD-10-CM | POA: Diagnosis not present

## 2024-04-03 DIAGNOSIS — M009 Pyogenic arthritis, unspecified: Secondary | ICD-10-CM | POA: Diagnosis not present

## 2024-06-20 ENCOUNTER — Encounter: Payer: Self-pay | Admitting: Gastroenterology
